# Patient Record
Sex: Male | Born: 1955 | Race: Black or African American | Hispanic: No | Marital: Married | Smoking: Former smoker
Health system: Southern US, Community
[De-identification: ages and names within clinical notes are randomized; demographics above are authoritative.]

## PROBLEM LIST (undated history)

## (undated) DIAGNOSIS — K219 Gastro-esophageal reflux disease without esophagitis: Secondary | ICD-10-CM

## (undated) DIAGNOSIS — J9383 Other pneumothorax: Secondary | ICD-10-CM

## (undated) DIAGNOSIS — I429 Cardiomyopathy, unspecified: Secondary | ICD-10-CM

## (undated) DIAGNOSIS — N182 Chronic kidney disease, stage 2 (mild): Secondary | ICD-10-CM

## (undated) DIAGNOSIS — I10 Essential (primary) hypertension: Secondary | ICD-10-CM

## (undated) DIAGNOSIS — I5042 Chronic combined systolic (congestive) and diastolic (congestive) heart failure: Secondary | ICD-10-CM

## (undated) DIAGNOSIS — I2510 Atherosclerotic heart disease of native coronary artery without angina pectoris: Secondary | ICD-10-CM

## (undated) DIAGNOSIS — I480 Paroxysmal atrial fibrillation: Secondary | ICD-10-CM

## (undated) DIAGNOSIS — I513 Intracardiac thrombosis, not elsewhere classified: Secondary | ICD-10-CM

## (undated) DIAGNOSIS — E782 Mixed hyperlipidemia: Secondary | ICD-10-CM

## (undated) DIAGNOSIS — I4719 Other supraventricular tachycardia: Secondary | ICD-10-CM

## (undated) DIAGNOSIS — I2781 Cor pulmonale (chronic): Secondary | ICD-10-CM

## (undated) DIAGNOSIS — E119 Type 2 diabetes mellitus without complications: Secondary | ICD-10-CM

## (undated) DIAGNOSIS — Z8701 Personal history of pneumonia (recurrent): Secondary | ICD-10-CM

## (undated) DIAGNOSIS — J449 Chronic obstructive pulmonary disease, unspecified: Secondary | ICD-10-CM

## (undated) DIAGNOSIS — I471 Supraventricular tachycardia: Secondary | ICD-10-CM

## (undated) HISTORY — DX: Cor pulmonale (chronic): I27.81

## (undated) HISTORY — DX: Cardiomyopathy, unspecified: I42.9

## (undated) HISTORY — DX: Gastro-esophageal reflux disease without esophagitis: K21.9

## (undated) HISTORY — DX: Intracardiac thrombosis, not elsewhere classified: I51.3

## (undated) HISTORY — DX: Essential (primary) hypertension: I10

## (undated) HISTORY — PX: PILONIDAL CYST EXCISION: SHX744

## (undated) HISTORY — DX: Chronic obstructive pulmonary disease, unspecified: J44.9

## (undated) HISTORY — PX: CORONARY ANGIOPLASTY WITH STENT PLACEMENT: SHX49

## (undated) HISTORY — DX: Atherosclerotic heart disease of native coronary artery without angina pectoris: I25.10

---

## 1997-10-08 ENCOUNTER — Ambulatory Visit (HOSPITAL_COMMUNITY): Admission: RE | Admit: 1997-10-08 | Discharge: 1997-10-08 | Payer: Self-pay | Admitting: Orthopedic Surgery

## 1997-10-23 ENCOUNTER — Ambulatory Visit (HOSPITAL_COMMUNITY): Admission: RE | Admit: 1997-10-23 | Discharge: 1997-10-23 | Payer: Self-pay | Admitting: Orthopedic Surgery

## 1997-11-06 ENCOUNTER — Ambulatory Visit (HOSPITAL_COMMUNITY): Admission: RE | Admit: 1997-11-06 | Discharge: 1997-11-06 | Payer: Self-pay | Admitting: Orthopedic Surgery

## 2000-04-03 DIAGNOSIS — J9383 Other pneumothorax: Secondary | ICD-10-CM

## 2000-04-03 HISTORY — DX: Other pneumothorax: J93.83

## 2000-04-20 ENCOUNTER — Encounter: Payer: Self-pay | Admitting: Thoracic Surgery (Cardiothoracic Vascular Surgery)

## 2000-04-20 ENCOUNTER — Encounter (INDEPENDENT_AMBULATORY_CARE_PROVIDER_SITE_OTHER): Payer: Self-pay | Admitting: Specialist

## 2000-04-20 ENCOUNTER — Encounter: Payer: Self-pay | Admitting: *Deleted

## 2000-04-20 ENCOUNTER — Inpatient Hospital Stay (HOSPITAL_COMMUNITY): Admission: EM | Admit: 2000-04-20 | Discharge: 2000-05-10 | Payer: Self-pay | Admitting: *Deleted

## 2000-04-20 ENCOUNTER — Encounter (INDEPENDENT_AMBULATORY_CARE_PROVIDER_SITE_OTHER): Payer: Self-pay | Admitting: *Deleted

## 2000-04-21 ENCOUNTER — Encounter: Payer: Self-pay | Admitting: Thoracic Surgery (Cardiothoracic Vascular Surgery)

## 2000-04-22 ENCOUNTER — Encounter: Payer: Self-pay | Admitting: Thoracic Surgery (Cardiothoracic Vascular Surgery)

## 2000-04-23 ENCOUNTER — Encounter: Payer: Self-pay | Admitting: Thoracic Surgery (Cardiothoracic Vascular Surgery)

## 2000-04-24 ENCOUNTER — Encounter: Payer: Self-pay | Admitting: Thoracic Surgery (Cardiothoracic Vascular Surgery)

## 2000-04-25 ENCOUNTER — Encounter: Payer: Self-pay | Admitting: Thoracic Surgery (Cardiothoracic Vascular Surgery)

## 2000-04-26 ENCOUNTER — Encounter: Payer: Self-pay | Admitting: Thoracic Surgery (Cardiothoracic Vascular Surgery)

## 2000-04-26 HISTORY — PX: LUNG REMOVAL, PARTIAL: SHX233

## 2000-04-27 ENCOUNTER — Encounter: Payer: Self-pay | Admitting: Thoracic Surgery (Cardiothoracic Vascular Surgery)

## 2000-04-28 ENCOUNTER — Encounter: Payer: Self-pay | Admitting: Thoracic Surgery (Cardiothoracic Vascular Surgery)

## 2000-04-29 ENCOUNTER — Encounter: Payer: Self-pay | Admitting: Thoracic Surgery

## 2000-04-29 ENCOUNTER — Encounter: Payer: Self-pay | Admitting: Thoracic Surgery (Cardiothoracic Vascular Surgery)

## 2000-04-30 ENCOUNTER — Encounter: Payer: Self-pay | Admitting: Thoracic Surgery (Cardiothoracic Vascular Surgery)

## 2000-05-01 ENCOUNTER — Encounter: Payer: Self-pay | Admitting: Thoracic Surgery (Cardiothoracic Vascular Surgery)

## 2000-05-02 ENCOUNTER — Encounter: Payer: Self-pay | Admitting: Thoracic Surgery (Cardiothoracic Vascular Surgery)

## 2000-05-03 ENCOUNTER — Encounter: Payer: Self-pay | Admitting: Thoracic Surgery (Cardiothoracic Vascular Surgery)

## 2000-05-04 ENCOUNTER — Encounter: Payer: Self-pay | Admitting: Thoracic Surgery (Cardiothoracic Vascular Surgery)

## 2000-05-04 ENCOUNTER — Encounter: Payer: Self-pay | Admitting: Thoracic Surgery

## 2000-05-05 ENCOUNTER — Encounter: Payer: Self-pay | Admitting: Thoracic Surgery (Cardiothoracic Vascular Surgery)

## 2000-05-06 ENCOUNTER — Encounter: Payer: Self-pay | Admitting: Thoracic Surgery (Cardiothoracic Vascular Surgery)

## 2000-05-07 ENCOUNTER — Encounter: Payer: Self-pay | Admitting: Thoracic Surgery (Cardiothoracic Vascular Surgery)

## 2000-05-08 ENCOUNTER — Encounter: Payer: Self-pay | Admitting: Thoracic Surgery

## 2000-05-09 ENCOUNTER — Encounter: Payer: Self-pay | Admitting: Thoracic Surgery (Cardiothoracic Vascular Surgery)

## 2000-05-10 ENCOUNTER — Encounter: Payer: Self-pay | Admitting: Thoracic Surgery (Cardiothoracic Vascular Surgery)

## 2000-05-14 ENCOUNTER — Encounter: Payer: Self-pay | Admitting: Thoracic Surgery (Cardiothoracic Vascular Surgery)

## 2000-05-14 ENCOUNTER — Encounter
Admission: RE | Admit: 2000-05-14 | Discharge: 2000-05-14 | Payer: Self-pay | Admitting: Thoracic Surgery (Cardiothoracic Vascular Surgery)

## 2000-05-14 ENCOUNTER — Inpatient Hospital Stay (HOSPITAL_COMMUNITY)
Admission: AD | Admit: 2000-05-14 | Discharge: 2000-05-21 | Payer: Self-pay | Admitting: Thoracic Surgery (Cardiothoracic Vascular Surgery)

## 2000-05-15 ENCOUNTER — Encounter: Payer: Self-pay | Admitting: Thoracic Surgery (Cardiothoracic Vascular Surgery)

## 2000-05-16 ENCOUNTER — Encounter: Payer: Self-pay | Admitting: Thoracic Surgery (Cardiothoracic Vascular Surgery)

## 2000-05-17 ENCOUNTER — Encounter: Payer: Self-pay | Admitting: Thoracic Surgery (Cardiothoracic Vascular Surgery)

## 2000-05-18 ENCOUNTER — Encounter: Payer: Self-pay | Admitting: Thoracic Surgery (Cardiothoracic Vascular Surgery)

## 2000-05-19 ENCOUNTER — Encounter: Payer: Self-pay | Admitting: Thoracic Surgery (Cardiothoracic Vascular Surgery)

## 2000-05-20 ENCOUNTER — Encounter: Payer: Self-pay | Admitting: Thoracic Surgery (Cardiothoracic Vascular Surgery)

## 2000-05-21 ENCOUNTER — Encounter: Payer: Self-pay | Admitting: Thoracic Surgery (Cardiothoracic Vascular Surgery)

## 2000-05-30 ENCOUNTER — Ambulatory Visit: Admission: RE | Admit: 2000-05-30 | Discharge: 2000-05-30 | Payer: Self-pay | Admitting: Pulmonary Disease

## 2000-06-04 ENCOUNTER — Encounter: Payer: Self-pay | Admitting: Thoracic Surgery (Cardiothoracic Vascular Surgery)

## 2000-06-04 ENCOUNTER — Encounter
Admission: RE | Admit: 2000-06-04 | Discharge: 2000-06-04 | Payer: Self-pay | Admitting: Thoracic Surgery (Cardiothoracic Vascular Surgery)

## 2000-07-02 ENCOUNTER — Encounter: Payer: Self-pay | Admitting: Thoracic Surgery (Cardiothoracic Vascular Surgery)

## 2000-07-02 ENCOUNTER — Encounter
Admission: RE | Admit: 2000-07-02 | Discharge: 2000-07-02 | Payer: Self-pay | Admitting: Thoracic Surgery (Cardiothoracic Vascular Surgery)

## 2000-07-31 ENCOUNTER — Encounter (HOSPITAL_COMMUNITY): Admission: RE | Admit: 2000-07-31 | Discharge: 2000-10-29 | Payer: Self-pay | Admitting: Surgical Oncology

## 2000-10-30 ENCOUNTER — Encounter (HOSPITAL_COMMUNITY): Admission: RE | Admit: 2000-10-30 | Discharge: 2001-01-28 | Payer: Self-pay | Admitting: Surgical Oncology

## 2004-07-18 ENCOUNTER — Ambulatory Visit: Payer: Self-pay | Admitting: Pulmonary Disease

## 2005-08-01 ENCOUNTER — Ambulatory Visit: Payer: Self-pay | Admitting: Pulmonary Disease

## 2006-11-20 ENCOUNTER — Ambulatory Visit: Payer: Self-pay | Admitting: Emergency Medicine

## 2007-03-13 ENCOUNTER — Telehealth (INDEPENDENT_AMBULATORY_CARE_PROVIDER_SITE_OTHER): Payer: Self-pay | Admitting: *Deleted

## 2007-03-14 ENCOUNTER — Telehealth (INDEPENDENT_AMBULATORY_CARE_PROVIDER_SITE_OTHER): Payer: Self-pay | Admitting: *Deleted

## 2007-05-14 ENCOUNTER — Telehealth (INDEPENDENT_AMBULATORY_CARE_PROVIDER_SITE_OTHER): Payer: Self-pay | Admitting: *Deleted

## 2007-05-14 DIAGNOSIS — K219 Gastro-esophageal reflux disease without esophagitis: Secondary | ICD-10-CM | POA: Insufficient documentation

## 2007-05-14 DIAGNOSIS — J4489 Other specified chronic obstructive pulmonary disease: Secondary | ICD-10-CM | POA: Insufficient documentation

## 2007-05-14 DIAGNOSIS — J449 Chronic obstructive pulmonary disease, unspecified: Secondary | ICD-10-CM

## 2007-06-05 ENCOUNTER — Ambulatory Visit: Payer: Self-pay | Admitting: Emergency Medicine

## 2008-01-08 ENCOUNTER — Emergency Department (HOSPITAL_COMMUNITY): Admission: EM | Admit: 2008-01-08 | Discharge: 2008-01-08 | Payer: Self-pay | Admitting: Emergency Medicine

## 2008-01-14 ENCOUNTER — Telehealth (INDEPENDENT_AMBULATORY_CARE_PROVIDER_SITE_OTHER): Payer: Self-pay | Admitting: *Deleted

## 2008-01-15 ENCOUNTER — Ambulatory Visit: Payer: Self-pay | Admitting: Internal Medicine

## 2008-02-04 ENCOUNTER — Ambulatory Visit: Payer: Self-pay | Admitting: Emergency Medicine

## 2008-08-04 ENCOUNTER — Ambulatory Visit: Payer: Self-pay | Admitting: Cardiology

## 2009-06-01 ENCOUNTER — Ambulatory Visit: Payer: Self-pay | Admitting: Emergency Medicine

## 2009-06-28 ENCOUNTER — Encounter (HOSPITAL_COMMUNITY): Admission: RE | Admit: 2009-06-28 | Discharge: 2009-07-28 | Payer: Self-pay | Admitting: Preventative Medicine

## 2009-08-02 ENCOUNTER — Ambulatory Visit (HOSPITAL_COMMUNITY): Admission: RE | Admit: 2009-08-02 | Discharge: 2009-08-02 | Payer: Self-pay | Admitting: Preventative Medicine

## 2009-08-04 ENCOUNTER — Encounter: Admission: RE | Admit: 2009-08-04 | Discharge: 2009-08-04 | Payer: Self-pay | Admitting: Preventative Medicine

## 2009-08-23 ENCOUNTER — Encounter
Admission: RE | Admit: 2009-08-23 | Discharge: 2009-08-23 | Payer: Self-pay | Source: Home / Self Care | Admitting: Preventative Medicine

## 2010-04-24 ENCOUNTER — Encounter: Payer: Self-pay | Admitting: Preventative Medicine

## 2010-05-03 NOTE — Progress Notes (Signed)
Summary: need samples  Phone Note Call from Patient   Caller: Patient Call For: byrum Summary of Call: need samples of advair 250/50 inhaler Patient's chart has been requested.  Initial call taken by: Rickard Patience,  May 14, 2007 1:23 PM  Follow-up for Phone Call        No samples available. Pt to refill rx through pharmacy.  He was also sched an rov with [facility] for march 4th 09. Follow-up by: Vernie Murders,  May 14, 2007 2:24 PM

## 2010-05-03 NOTE — Assessment & Plan Note (Signed)
Summary: COPD   Visit Type:  Follow-up Primary Provider/Referring Provider:  Dr Cecelia Byars  CC:  Followup COPD.  Pt last seen Nov 2009.  Pt states that overall breathing has been doing okay.  He needs refills for advair and proair.  Pt denies any complaints today.Marland Kitchen  History of Present Illness: Mr. 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    01/15/2008--Complains of 2 weeks of cough, congestion, blood tinged initially. Went to ER CXR and CT chest showed 4 R broken ribs, negative for PE. bilateral scarring, and questionable opacities. Started on Avelox for 4 days.(per pt ). Some better but still has coughing fits with thick mucus. and DOE, no energy. Pain in ribs with coughng. finished avelox 2 days. ago.   02/04/08 - returns today for f/u, has completed avelox. CP is better. Breathing is better. Cough and hemoptysis have resolved. Seldom uses ProAir.   ROV 06/01/09 -- Returns for f/u, last seen 2009. His breathing has been stable. Tells me he ran out of Advair about 2 weeks ago, noticed that his SABA use increased some. Has had some anxiety at night about not getting his meds but breathing ok. No exacerbations since last visit. No smoking. No cough or hemoptysis.   Current Medications (verified): 1)  Advair Diskus 250-50 Mcg/dose Misc (Fluticasone-Salmeterol) .... Inhale 1 Puff Two Times A Day 2)  Omeprazole 20 Mg Cpdr (Omeprazole) .... Take 1 Capsule By Mouth Once A Day As Needed 3)  Proair Hfa 108 (90 Base) Mcg/act  Aers (Albuterol Sulfate) .... Inhale 2 Puffs Every 4-6 Hours As Needed 4)  Percocet 7.5-325 Mg Tabs (Oxycodone-Acetaminophen) .... Take 1 Tablet By Mouth Three Times A Day As Needed 5)  Ibuprofen 600 Mg Tabs (Ibuprofen) .... Take 1 Tablet By Mouth Three Times A Day As Needed 6)  Crestor 10 Mg Tabs (Rosuvastatin Calcium) .... Take 1 Tablet By Mouth Once A Day 7)  Norvasc 5 Mg Tabs (Amlodipine Besylate) ....  Take 1 Tablet By Mouth Once A Day  Allergies (verified): No Known Drug Allergies  Vital Signs:  Patient profile:   55 year old male Height:      67 inches Weight:      195.50 pounds BMI:     30.73 O2 Sat:      96 % on Room air Temp:     98.2 degrees F oral Pulse rate:   82 / minute BP sitting:   126 / 74  (left arm)  Vitals Entered By: Vernie Murders (June 01, 2009 3:15 PM)  O2 Flow:  Room air  Physical Exam  General:  well developed, well nourished, in no acute distress Head:  normocephalic and atraumatic Nose:  no deformity, discharge, inflammation, or lesions Mouth:  no deformity or lesions Neck:  no masses, thyromegaly, or abnormal cervical nodes Chest Wall:  no deformities noted Lungs:  no wheezing.  Heart:  regular rate and rhythm, S1, S2 without murmurs, rubs, gallops, or clicks Abdomen:  not examined Extremities:  no clubbing, cyanosis, edema, or deformity noted Neurologic:  CN II-XII grossly intact with normal reflexes, coordination, muscle strength and tone   Impression & Recommendations:  Problem # 1:  COPD (ICD-496) Advair + ProAir repeat PFT and CXR in a year ROV 6 months and get the flu shot at that time  Medications Added to Medication List This Visit: 1)  Omeprazole 20 Mg Cpdr (Omeprazole) .... Take 1 capsule  by mouth once a day as needed  Other Orders: Est. Patient Level III ([address]109)  Patient Instructions: 1)  Continue your Advair two times a day  2)  Have your ProAir available to use as needed  3)  Follow up with Dr Delton Coombes in 6 months or as needed  4)  You will need a flu shot at your next visit.  Prescriptions: ADVAIR DISKUS 250-50 MCG/DOSE MISC (FLUTICASONE-SALMETEROL) Inhale 1 puff two times a day  #60 Each x 11   Entered and Authorized by:   Leslye Peer MD   Signed by:   Leslye Peer MD on 06/01/2009   Method used:   Electronically to        [facility] (retail)       [address]       [place], [place]  [address]       Ph: [phone]       Fax: [phone]   RxID:   [id number] PROAIR HFA 108 (90 BASE) MCG/ACT  AERS (ALBUTEROL SULFATE) inhale 2 puffs every 4-6 hours as needed  #1 x 6   Entered and Authorized by:   Leslye Peer MD   Signed by:   Leslye Peer MD on 06/01/2009   Method used:   Electronically to        [facility] (retail)       [address]       [place], [place]  [address]       Ph: [phone]       Fax: [phone]   RxID:   [id number]

## 2010-07-06 ENCOUNTER — Other Ambulatory Visit: Payer: Self-pay | Admitting: Emergency Medicine

## 2010-07-13 ENCOUNTER — Ambulatory Visit: Payer: Self-pay | Admitting: Emergency Medicine

## 2010-07-25 ENCOUNTER — Encounter: Payer: Self-pay | Admitting: Emergency Medicine

## 2010-07-27 ENCOUNTER — Ambulatory Visit: Payer: Self-pay | Admitting: Emergency Medicine

## 2010-08-16 NOTE — Assessment & Plan Note (Signed)
[facility]                             PULMONARY OFFICE NOTE   Danny [name], Danny [name]                         MRN:          [id number]  DATE:11/20/2006                            DOB:          March 15, 1956    PULMONARY FOLLOWUP VISIT   SUBJECTIVE:  Danny [name] is a 55 year old gentleman with a history of COPD  and bullous emphysema status post bilateral bullectomies in  2002.  He  returns today for a regularly scheduled followup visit.  Since his last  visit with Dr. Jayme Cloud, he has been doing fairly well.  He has good  exertional tolerance.  He denies any significant cough.  He has not had  any exacerbation since his last visit.  He does complain of some  occasional costochondritic pain that is sharp and cramping in nature.  It occurs at the sites of his old chest tubes.  He has not had any  exertional chest discomfort or any symptoms consistent with angina.   MEDICATIONS:  1. Norvasc 5 mg daily.  2. Advair 250/50 one inhalation b.i.d.  3. Omeprazole 20 mg daily.   EXAM:  GENERAL:  This is a very pleasant gentleman who is in no distress  on room air.  His weight is 189 pounds, up 2 pounds from his last visit, temperature  98.4, blood pressure 110/78, heart rate 97, SpO2 93% on room air.  HEENT:  Benign.  NECK:  Supple without lymphadenopathy or stridor.  LUNGS:  Distant with good air movement on a normal respiratory cycle and  some wheezing on a forced expiration.  HEART:  Regular without murmur.  ABDOMEN:  Soft, nontender, nondistended with positive bowel sounds.  EXTREMITIES:  No cyanosis, clubbing, or edema.   IMPRESSION:  1. Chronic obstructive pulmonary disease and a history of bullous      emphysema status post bilateral bullectomies.  2. Gastroesophageal reflux disease.   PLAN:  1. He will continue his Advair as currently scheduled.  2. I will give him albuterol to use on an as needed basis.  3. Danny [name] will have a Pneumovax today and  we will arrange to repeat      his immunizations      every 5 years until he is 55 years old.  4. He will return to see me in 6 months or sooner should he have any      difficulty in the interim.     Leslye Peer, MD  Electronically Signed    RSB/MedQ  DD: 11/20/2006  DT: 11/20/2006  Job #: [id number]

## 2010-08-19 NOTE — Procedures (Signed)
[place]. [facility]  Patient:    Danny [name], Danny [name]                         MRN: [id number] Proc. Date: 05/02/00 Adm. Date:  [id number] Attending:  Tressie Stalker CC:         Anesthesia Department   Procedure Report  PREOPERATIVE DIAGNOSIS:  History of emphysematous bleb disease with pneumothorax.  POSTOPERATIVE DIAGNOSIS:  History of emphysematous bleb disease with pneumothorax.  PROCEDURE:  Thoracotomy for resection of portion of a lung and emphysematous blebs.  ANESTHESIA PROCEDURE:  Placement of thoracic epidural catheter for postoperative analgesia.  DESCRIPTION OF PROCEDURE:  Preoperatively, risks and benefits of placement of the epidural catheter were discussed in detail with the patient, including the recommendation by his thoracic surgeon that he have this for his postoperative pain control.  In fact, the patient had had a previous epidural after previous similar surgery on the other side two weeks prior.  He consented to the placement of the epidural catheter for his postoperative analgesia.  At the end of the operative procedure, the patient was allowed to remain in the right lateral decubitus position.  After sterile prep of the midthoracic area was conducted, using a #17 gauge Tuohy needle adjacent to the T8-9 interspace, the epidural space was contacted with loss of resistance technique and the catheter threaded to approximately 3-4 cm beyond the needle tip, and the needle was removed.  After negative aspiration of both heme and CSF, the catheter was injected slowly with 5 cc of 0.25% Marcaine containing 100 mcg _____.  The patient was secured in place with tape.  The patient was turned supine, extubated, and transferred to the PACU in stable condition.  DISPOSITION:  The patient will be followed daily by the department of anesthesiology for his postoperative analgesia via epidural catheter. DD:  05/02/00 TD:  05/02/00 Job:  [id number]

## 2010-08-19 NOTE — H&P (Signed)
[place]. [facility]  Patient:    Danny [name], Danny [name]                         MRN: [id number] Adm. Date:  05/14/00 Attending:  Salvatore Decent. Cornelius Moras, M.D. Dictator:   Marlowe Kays, P.A.                         History and Physical  DATE OF BIRTH:  Jan 10, 1956.  PULMONOLOGIST:  Dr. Danice Goltz.  CHIEF COMPLAINT:  Loculated right pneumothorax.  HISTORY OF PRESENT ILLNESS:  Danny [name] is a pleasant 55 year old black male with a long history of pulmonary disease and tobacco abuse, status post left VATS resection of the left bleb -- apical pleurectomy -- on May 02, 2000 secondary to recurrent left pneumothorax as well as right VATS, right wedge resection of the giant right apical bleb and apical pleural tent on April 26, 2000.  The patient presented for a followup visit at [facility], with a chest x-ray.  Despite the fact that the patient is asymptomatic for pneumothorax, a chest x-ray revealed right lower lobe pneumothorax, loculated. His right chest tube is in place.  Upon evaluation, Dr. Salvatore Decent. Cornelius Moras recommended that the patient be admitted in the hospital for resolution of this pneumothorax.  PAST MEDICAL HISTORY 1. History of recurrent pneumothorax. 2. Remote history of tobacco abuse. 3. Hypertension.  PAST SURGICAL HISTORY 1. Status post right VATS, mini-thoracotomy with right upper lobe wedge    resection of giant bulla and apical pleurodesis on April 26, 2000,    Dr. Cornelius Moras. 2. Left VATS, mini-thoracotomy with resection of the left lung bleb and    apical pleurectomy, May 02, 2000. 3. Multiple chest tube placements during last hospitalization. 4. Status post Heimlich valve placement, May 08, 2000.  MEDICATIONS 1. Norvasc 5 mg p.o. q.d. 2. Albuterol and Pulmicort nebulizer treatments q.i.d.  ALLERGIES:  No known drug allergies.  REVIEW OF SYSTEMS:  The patient denies any history of diabetes, kidney disease, asthma, TIA, CVA,  amaurosis fugax, angina, arrhythmias, MI, dysuria, hematuria, CHF.  He denies any shortness of breath, dyspnea on exertion, paroxysmal nocturnal dyspnea or orthopnea.  No fever or chills.  No hemoptysis.  FAMILY HISTORY:  Noncontributory.  SOCIAL HISTORY:  The patient is married with two children.  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  He quit nine years ago the use of cigarettes.  He denies any alcohol intake.  PHYSICAL EXAMINATION  GENERAL:  This is a well-developed, thin 55 year old black male in no acute distress, alert and oriented x 3.  VITAL SIGNS:  Blood pressure 110/58, pulse 70, respirations 18.  HEENT:  Normocephalic, atraumatic.  PERRLA.  EOMI.  Funduscopic exam within normal limits.  NECK:  Supple.  No JVD, bruits, thyromegaly or lymphadenopathy.  CHEST:  Symmetrical on inspiration with absent breath sounds in the right lower lobe area with tympany to percussion.  There are well-healed bilateral VATS incisions.  CARDIOVASCULAR:  Regular rate and rhythm.  No murmurs, rubs, or gallops.  ABDOMEN:  Soft and nontender.  Bowel sounds x 4.  No hepatosplenomegaly, masses palpable or abdominal bruits.  GU:  Deferred.  RECTAL:  Deferred.  EXTREMITIES:  No clubbing, cyanosis, or edema.  SKIN:  Skin shows no ulcerations, warm temperature and normal hair pattern.  PERIPHERAL PULSES:  Carotids 2+ bilaterally, femorals 2+ bilateral. Popliteal, dorsalis pedis and posterior tibialis 2+  bilaterally.  NEUROLOGIC:  Nonfocal.  Normal gait.  DTRs 2+ bilaterally.  Muscle strength 5/5.  ASSESSMENT AND PLAN:  55-year-old black male with loculated right pneumothorax loculated right pneumothorax who will be admitted to [facility] on May 14, 2000 by Dr. Cornelius Moras.  Dr. Cornelius Moras has seen and evaluated this patient prior to the admission and has explained the reasons for him being admitted to the hospital.  The patient has agreed to continue in this emergency admission.  DD: 05/15/00 TD:  05/15/00 Job: [id number]

## 2010-08-19 NOTE — Discharge Summary (Signed)
[place]. [facility]  Patient:    Danny [name], Danny [name]                         MRN: [id number] Adm. Date:  04/20/00 Disc. Date: 05/10/00 Attending:  Salvatore Decent. Cornelius Moras, M.D. Dictator:   Lissa Merlin, P.A.-C. CC:         [facility] Office  Danice Goltz, M.D.   Discharge Summary  DATE OF BIRTH:  [birth year]  SURGEON:  Salvatore Decent. Cornelius Moras, M.D.  PULMONOLOGY:  Danice Goltz, M.D.  ADMISSION DIAGNOSIS:  Acute shortness of breath.  DISCHARGE DIAGNOSES: 1. Bilateral spontaneous pneumothoraces April 20, 2000. 2. 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 3. Severe chronic obstructive pulmonary disease with severe bullous emphysema    component. 4. Spontaneous right tension pneumothorax April 24, 2000. 5. Tracheobronchitis treated with intravenous antibiotics, resolved. 6. Persistent right-side air leak, Heimlich valve inserted. 7. Postoperative mild acute renal insufficiency, resolved. 8. Mild postoperative anemia, improved.  PROCEDURES: 1. Bilateral chest tube placement for spontaneous pneumothoraces    April 20, 2000. 2. Right chest tube discontinued April 23, 2000. 3. Right chest tube reinserted April 24, 2000 at 4:30 a.m. secondary to    recurrent spontaneous pneumothorax. 4. Right VATS mini thoracotomy with right upper lobe wedge resection of giant    bulla and apical pleurodesis April 26, 2000. 5. Left pleural chest tube discontinued April 28, 2000. 6. Recurrent left tension pneumothorax April 29, 2000, new left chest tube    inserted. 7. Left chest tube discontinued May 04, 2000. 8. Left VATS mini thoracotomy with resection of left lung bleb and apical    pleurectomy May 02, 2000. 9. Heimlich valve applied May 08, 2000.  MEDICATIONS: 1. Home oxygen 2 L/min as needed. 2. Albuterol nebulizer 2.5 mg q.6h. 3. Budesonide nebulizer 0.25 mg q.6h. 4. Darvocet-N 100 one to two p.o. q.4-6h. p.r.n. for pain. 5. Norvasc 5 mg one  p.o. q.d. for previously diagnosed hypertension.  ALLERGIES:  No known drug allergies.  HISTORY OF PRESENT ILLNESS:  Patient is a 55 year old African-American male who initially presented to the emergency room with sudden onset of chest pain and severe shortness of breath.  He was found to have large bilateral pneumothoraces.  Bilateral chest tubes were inserted.  Over the next several days, Mr. Lax developed recurrent pneumothoraces on the right and left requiring additional chest tube placement.  He ultimately underwent bilateral thoracotomies with resection of blebs and pleurodesis/pleurectomy.  He did well with the surgeries and had no major complications.  He did have a persistent right-sided air leak eventually necessitating Heimlich valve attachment on May 08, 2000.  He has been seen by pulmonology who has put him on nebulizer therapy.  By May 09, 2000, he is on unit 2000, alert, feeling well, ambulating without difficulty, using only low flow O2.  He says that he has walked around the unit without oxygen but does need it occasionally.  He is eating well.  He is having normal bowel movements. Urinary output is good.  He is afebrile.  Vital signs are stable.  He is 94% on 1 L.  Lab work has been satisfactory.  Mild postop anemia and mild renal insufficiency have improved.  The incision is healing well.  Physical exam is satisfactory.  Heimlich valve site is stable.  He is noted to be doing well with plans for discharge pending satisfactory morning rounds tomorrow, Thursday, May 10, 2000.  CONDITION:  Stable and  improved.  SPECIAL INSTRUCTIONS:  Mr. Wirick is told to do no driving, no strenuous activity, no lifting more than 10 pounds.  He is told to walk daily and use his incentive spirometer daily.  He is told that he can shower but to keep his incision areas clean and dry as possible and to avoid getting the Heimlich valve area wet.  He is given dressings to apply  to the end of the Heimlich valve daily as needed for any drainage that may occur.  He is told to call the office if he notices anything unusual with his wounds or with the valve. He is told home health nurse will visit him to check on the valve.  He is told to get a chest x-ray at [facility] at 10 a.m. on May 14, 2000 and bring it with him to see Dr. Cornelius Moras.  FOLLOWUP: 1. He will see Dr. Cornelius Moras on Monday, May 14, 2000 at 11 a.m. 2. He will see Dr. Jayme Cloud two to three weeks after discharge and is to call    office for appointment. DD:  05/09/00 TD:  05/10/00 Job: [id number]

## 2010-08-19 NOTE — Procedures (Signed)
[place]. [facility]  Patient:    [name], [name]                         MRN: [id number] Proc. Date: 04/26/00 Adm. Date:  [id number] Attending:  Tressie Stalker                           Procedure Report  PROCEDURE:  Epidural catheter placement.  ANESTHESIOLOGIST:  Halford Decamp, M.D.  INDICATIONS:  [name] is a 55 year old black male with bilateral spontaneous pneumothoraces for pulmonary blebs, who presents to the operating room for a thoracotomy and excision of blebs.  The patient underwent a routine anesthetic, and during his surgery the patient required an open thoracotomy, as opposed to a thoracoscopy, secondary to the large size of the blebs.  Based on the open thoracotomy, it was felt that the patient would benefit from a postoperative epidural.  DESCRIPTION OF PROCEDURE:  The patient was placed in the left lateral decubitus position and remained under general anesthesia.  The thoracic spine was sterilely prepped and draped.  The T6-7 interspace was approached using a #17 gauge Tuohy needle.  The needle was advanced via loss of resistance technique, and a catheter was inserted approximately 4.0 cm in the epidural space.  The needle was removed.  The catheter did not spray blood or CSF, and a 5 cc test dose of 1% lidocaine was negative.  Following the negative test dose, 2 cc of fentanyl was administered diluted to 6 cc with normal saline. The catheter was then secured to the patients back.  The patient was placed in the supine position, extubated, and brought to the post anesthesia care unit.  In the post anesthesia care unit the patient was placed on a Marcaine and fentanyl infusion, and will be followed by the acute pain service.  The patient tolerated the procedure well. DD:  04/26/00 TD:  04/26/00 Job: [id number]

## 2010-08-19 NOTE — H&P (Signed)
[place]. [facility]  Patient:    Danny [name], Danny [name]                         MRN: [id number] Adm. Date:  [id number] Attending:  Tressie Stalker                         History and Physical  ADMISSION DIAGNOSIS:  Bilateral spontaneous pneumothorax.  HISTORY OF PRESENT ILLNESS:  The patient is a 55 year old African-American male who presents to the emergency room with sudden onset of chest pain and severe shortness of breath beginning this evening between 5:30 and 6 oclock. The patient states he had been previously in his usual state of excellent health. He was work and doing some strenuous physical activity when he developed sudden onset of pain which was poorly localized and is described as dull, pressure-like pain across his chest.  Over the ensuing 45 minutes to an hour he developed progressive shortness of breath which rapidly progressed into respiratory distress.  The patient presented to the emergency room where he was noted to be hypoxic with shallow rapid breaths.  An arterial blood gas was obtained documenting a pH of 7.24 with a pCO2 of 67 and a pO2 of 47. Portable chest x-ray was performed demonstrating large bilateral pneumothoraces.  Thoracic surgical consultation was promptly obtained and bilateral chest tubes were placed uneventfully.  The patients respiratory distress resolved immediately.  REVIEW OF SYSTEMS:  The patient had been in his usual state of good health and denies any recent productive cough, fevers, chills, or shortness of breath.  PAST MEDICAL HISTORY:  Notable only for hypertension.  The patient does note that he had apaprently had episodes of pneumonia at least once or twice in the remote past.  He thinks that he may have had partial collapse of his lung in the distant past as well, although, he never required chest tube placement and this was never documented radiographically according to the patient.  PAST SURGICAL HISTORY:   Notable only for surgical excision of a perianal cyst.  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  He has a history of heavy tobacco use in the past, but he quit smoking several years ago.  He denies any history of heavy alcohol consumption.  MEDICATIONS:   Norvasc 5 mg p.o. once daily.  ALLERGIES:  The patient denies any known drug allergies or sensitivities.  PHYSICAL EXAMINATION:  At the time of initial presentation.  GENERAL:  A well-developed, well-nourished, young, thin, African-American male who appears somewhat younger than stated age.  He is in acute respiratory distress with shallow respirations.  VITAL SIGNS:  Blood pressure 160/90 and he is tachycardic with sinus rhythm at 110 beats per minute.  Oxygen saturation registers 88% on 100% face mask. Breath sounds are hollow and diminished bilaterally.  The trachea is midline.  HEART:  Demonstrates regular rate and rhythm.  No murmurs, rubs, or gallops are noted.  ABDOMEN:  Soft and nontender.  There is no sign of trauma on his chest wall including thoracic rib cage, nor the abdomen, nor either of his four extremities.  The remainder of his physical examination is unrevealing.  DIAGNOSTIC TESTS:  Admission portable chest x-ray demonstrates what appears to be two large bilateral pneumothoraces.  The apical segments of the left lung appear to be stuck, but there  is a large basilar and lateral pneumothorax on the left.  There also appears to be a large pneumothorax on the right.  The heart and mediastinal structures are midline.  Follow-up chest x-ray obtained bilateral chest tube placement documents reexpansion of the left lung with obvious large blebs within the pulmonary parenchyma.  The chest tube appears to be in appropriate position.  The right chest tube also appears to be in appropriate position within the chest, but there appears no  radiographic change in the appearance of the lung and this appears to be somewhat suggestive of a possible very large giant pulmonary bleb.  IMPRESSION:  Bilateral pneumothoraces with possible severe bullous emphysema with giant blebs.  PLAN:  Admission for close observation and chest tube placement to suction. We will obtain a chest CT scan to further document the presence and severity of the patients underlying lung disease. DD:  04/20/00 TD:  04/21/00 Job: [id number]

## 2010-08-19 NOTE — Op Note (Signed)
[place]. [facility]  Patient:    Danny [name], Danny [name]                         MRN: [id number] Proc. Date: 04/26/00 Adm. Date:  [id number] Attending:  Tressie Stalker CC:         Danice Goltz, M.D.             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  PROCEDURE:  Right VATS and right thoracotomy for wedge resection of giant right apical bleb, and apical pleural tent.  SURGEON:  Salvatore Decent. Cornelius Moras, M.D.  ASSISTANT:  _____.  ANESTHESIA:  General.  BRIEF CLINICAL NOTE:  The patient is a 55 year old male who was admitted on April 20, 2000, with spontaneous bilateral pneumothoraces.  Chest x-ray and chest CT scan confirmed the presence of severe bullous emphysema with a dramatic giant right lung bleb, which essentially obliterates and replaces the entire right upper lobe.  The patient was treated conservatively initially, and ultimately the right chest tube was removed after demonstration of resolution of the air leak.  Within the next 24 hours, the patient developed a recurrent large right pneumothorax.  The chest tube was replaced.  The patient and his wife are counseled at length regarding the indications and potential benefits of surgical intervention.  They understand the associated risks of surgery, including but not limited to risk of death, bleeding, arrhythmia, prolonged chest wall discomfort, pneumonia, respiratory failure, and prolonged air leak requiring prolonged chest tube drainage.  They accept these risks as well as any unforeseen complications and desire to proceed as described.  DESCRIPTION OF PROCEDURE:  The patient is brought to the operating room on the above-mentioned date, placed in the supine position on the operating table. Intravenous antibiotics are  administered.  Pneumatic sequential compression boots are placed on both lower legs to prevent deep venous thrombosis. General endotracheal anesthesia is induced uneventfully under the care and direction of J. Claybon Jabs, M.D.  A dual-lumen endotracheal tube is placed. The patient is turned to the left lateral decubitus position with an axillary roll and bean bag device to facilitate appropriate exposure and positioning. The right chest is prepared and draped in a sterile manner after removal of the existing right chest tube.  Single lung anesthesia is begun.  A small incision is made through approximately the anterior axillary line in the seventh intercostal space.  An endoscopic port is passed through the incision through the intercostal space.  A 30 degree videoscope camera is passed through the port.  The right chest is explored visually.  The right lung will not collapse.  Numerous attempts are made by the anesthesiologist to afford collapse of the lung such that videoscopic visualization is possible. Adequate exposure is not attained.  The right posterolateral thoracotomy incision is performed.  The latissimus dorsi muscle was divided with electrocautery.  The serratus anterior muscle was preserved.  The right chest is entered through the fifth intercostal space.  A portion of the fifth rib is divided to facilitate exposure.  Upon entry into the pleural space, the lung remains completely expanded although is not ventilated.  There appears to be severe air trapping with massive  obvious giant bleb, which replaces essentially the entire right upper lobe.  The bleb is entered sharply in its midportion, and the lung promptly decompresses.  The remainder of the lung is mobilized by dividing all adhesions between the visceral and parietal surfaces of the pleura with electrocautery and sharp dissection as necessary.  There are only a few adhesions which need to be taken down.  The  inferior pulmonary ligament is divided with electrocautery to facilitate complete exposure.  Careful examination is performed.  Essentially 85-90% of the right upper lobe is replaced by a single giant bleb. The right lower lobe and right middle lobe also have changes of emphysema but do not demonstrate any giant blebs and otherwise appear much more normal. There is a small rim of more normal-appearing lung parenchyma in the right upper lobe close to the hilum, especially anteriorly.  The trabeculations in the midportion of the bleb are divided sharply, and the cyst wall is subsequently rolled onto itself on both sides to facilitate the closure of normal tissue.  The giant bleb is then resected using multiple fires of Autosuture endoscopic GIA stapling device with 4.8 mm staples using Seamguard Gore-Tex to reinforce the staple lines.  A small portion of the cyst wall was excised sharply to be sent as a separate specimen labeled, "portion of bleb lining."  The remainder of the resected specimen is sent as "right upper lobe wedge resection" to pathology.  The staple lines are inspected and appear to be intact.  The right lung is gently ventilated and inspected for air leaks.  A small area on the apical posterior aspect of the small remaining portion of the right upper lobe has a small air leak.  This is addressed using running 3-0 Vicryl sutures in a horizontal mattress fashion to reapproximate the pleural surface.  There appears to be no significant air leak after completion of this maneuver.  The apical pleural tent is constructed by using electrocautery to score the pleura in a rectangular shape over the apex of the right chest and mediastinum.  The pleura is then mobilized using a combination of blunt dissection and electrocautery to facilitate a flap.  This flap was then allowed to drape over the surface of the right upper lobe.  It is secure in place in a couple areas with  interrupted 3-0 Vicryl sutures.  The right chest is irrigated with copious warm saline solution containing  antibiotic.  The right lung is again inflated and inspected for air leaks. There appear to be minimal residual small-caliber leaks from the lung parenchyma.  The right chest is drained using two 28 French chest tubes placed through separate stab wounds inferiorly.  One of these is placed through the original port incision.  The rightmost lateral thoracotomy incision is closed in multiple layers in routine fashion.  The skin incisions closed with subcuticular skin closure.  The chest tubes then fixed to a closed-suction drainage site.  The patient tolerated the procedure well.  A thoracic epidural catheter is placed by Dr. Krista Blue after cmopletion of the operation.  The patient is turned to the supine position, extubated in the operating room and transported to the recovery room in stable condition.  There were no intraoperative complications.  All sponge, instrument, and needle counts are verified correct at the completion of the operation.  Estimated blood loss was minimal, or less than 50 cc. DD:  04/26/00 TD:  04/26/00 Job: [id number]

## 2010-08-19 NOTE — Discharge Summary (Signed)
[place]. [facility]  Patient:    Danny [name], Danny [name]                       MRN: [id number] Adm. Date:  [id number] Disc. Date: 05/21/00 Attending:  Tressie Stalker Dictator:   Adair Patter, P.A. CC:         Danice Goltz, M.D.   Discharge Summary  DATE OF BIRTH:  July 04, 1955  ADMITTING DIAGNOSIS:  Loculated right pneumothorax.  SECONDARY DIAGNOSES: 1. History of recurrent pneumothorax. 2. History of tobacco abuse. 3. Hypertension.  DISCHARGE DIAGNOSIS:  Status post resolution of right loculated pneumothorax.  HOSPITAL PROCEDURES:  CT guided right chest tube placement.  HISTORY OF PRESENT ILLNESS:  The patient is a patient who is well-known to Dr. Cornelius Moras. He was seen in the office complaining of chest discomfort. He had a chest x-ray taken, which revealed an approximately 30% right basilar pneumothorax, which was new.  HOSPITAL COURSE:  Dr. Cornelius Moras subsequently admitted the patient to [facility] where he was admitted and had his chest tube, which he currently had, with Heimlich valve placed to suction. Over the next several days the patient was followed with serial chest x-rays taken. These chest x-rays revealed that the patient continued to have a small loculated pneumothorax despite chest tube suction. Because of this the patient underwent CAT scan revealing the right loculated pneumothorax. At this time, he underwent CT guided placement of a right chest tube because of this pneumothorax. The patient underwent serial chest x-rays and his pneumothorax subsequently resolved. The chest tube was discontinued on May 19, 2000. He was subsequently discharged home in satisfactory condition on May 21, 2000.  DISCHARGE MEDICATIONS: 1. Darvocet-N 100 one to two tablets q.4-6h. as needed for pain. 2. Norvasc 5 mg one tablet daily. 3. Albuterol hand held nebulizer four times daily. 4. Pulmicort hand held nebulizer four times daily.  DISCHARGE  ACTIVITIES:  The patient was told to avoid driving and strenuous activity.  DISCHARGE DIET:  Low fat, low salt.  WOUND CARE:  The patient was told he could clean his incision with soap and water and shower.  DISPOSITION:  Home.  FOLLOWUP:  The patient was told to followup with Dr. Cornelius Moras in two weeks at his office. He was told to go to the [facility] one hour before his appointment with Dr. Cornelius Moras for chest x-ray and to bring his chest x-ray to Dr. Barry Dienes office with him. DD:  05/20/00 TD:  05/21/00 Job: [id number]

## 2010-08-19 NOTE — Op Note (Signed)
[name]. [facility]  Patient:    Danny [name], Danny [name]                         MRN: [id number] Proc. Date: 05/02/00 Adm. Date:  [id number] Attending:  Tressie Stalker CC:         CVTS Office  Danice Goltz, M.D.   Operative Report  PREOPERATIVE DIAGNOSIS:  Recurrent left pneumothorax with severe emphysema.  POSTOPERATIVE DIAGNOSIS:  Recurrent left pneumothorax with severe emphysema.  PROCEDURE:  Left video-assisted thoracoscopic surgery and left thoracotomy for resection of left lung blebs and apical pleurectomy.  SURGEON:  Salvatore Decent. Cornelius Moras, M.D.  ASSISTANT:  Lissa Merlin, P.A.  ANESTHESIA:  General.  BRIEF CLINICAL NOTE:  The patient is a 55 year old gentleman with a history of tobacco abuse and severe emphysema, who was admitted on April 20, 2000 with bilateral spontaneous pneumothorax.  Chest tubes were placed at that time. Initially, the right chest tube was removed, but the patient developed recurrent right spontaneous pneumothorax that ultimately required right thoracotomy for resection of giant blebs.  The patient continued to do well. However, after removal of the left chest tube, the patient also developed recurrent left pneumothorax.  OPERATIVE CONSENT:  The patient and his wife were counselled regarding the indications and potential benefits of surgical intervention.  They accepted all associated risks of surgery, including, but not limited to risk of death, stroke, myocardial infarction, bleeding requiring blood transfusion, pneumonia, respiratory failure, prolonged chest wall discomfort, and prolonged air leak requiring prolonged chest tube drainage.  They accepted these risks, as well as, any unforeseen complications and desired to proceed with surgery as described.  OPERATIVE NOTE IN DETAIL:  The patient was brought to the operating room on the above mentioned date and placed in the supine position on the operating table.  Intravenous  antibiotics were administered.  General endotracheal anesthesia was induced uneventfully under the care and direction of Dr. Burna Forts.  A dual-lumen endotracheal tube was placed.  The patient was turned to the right lateral decubitus position with the use of an axillary roll and a pneumatic bean bag device to facilitate appropriate positioning. Pneumatic sequential compression boots were placed on both lower legs to prevent deep venous thrombosis.  The left chest was prepared and draped in a sterile manner.  Prior to skin preparation, the patients preexisting left chest tube had been removed.  A small incision was made overlying approximately the seventh intercostal space in the anterior axillary line.  The left pleural space was entered carefully with electrocautery.  The left lung was obviously collapsed and well away from the chest wall in this vicinity.  An endoscopic port was placed through the incision and the left chest was explored visually using a 30 degree endoscopic camera passed through the port.  One could appreciate severe bullous emphysema.  There was also a large amount of adhesions between the visceral and parietal surfaces along the anterolateral surface of the left upper lobe.  The remainder of the left lung was completely free such that it can collapse.  Two additional port incisions were placed including one more anteriorly through the fourth intercostal space and a second more posteriorly through the fifth intercostal space.  Endoscopic techniques were utilized in an attempt to mobilize the left lung.  However, the adhesions overlying the anterolateral surface of the left upper lobe were very dense and severe.  The posterior incision was then extended into a miniature thoracotomy  incision to facilitate exposure and allow better mobilization of the left lung.  A portion of the fifth rib was divided to facilitate exposure.  The adhesions between the visceral  and parietal surfaces of the pleura along the left anterolateral surface of the left upper lobe were so severe that extrapleural dissection was felt to be necessary.  This was performed uneventfully.  Eventually, the lung was completely mobilized.  The inferior pulmonary ligament was divided with electrocautery.  The left lung was examined.  There was one large bleb obviously  seen in the lingular portion of the left upper lobe.  This was resected using several fires of endoscopic stapling device with 3.5 mm staples and Gore-Tex Shearguard strips to buttress the staple lines.  This specimen was sent as left upper lobe bleb to pathology.  One can now also appreciate bleb formation at the apex of the left upper lobe.  The apical portion of the left upper lobe was then resected using several fires of the stapling device in a similar manner.  The remainder of the left lung was inspected and no other sites of obvious significant bleb formation were identified grossly. The left hemithorax was then filled with warm saline solution and the left lung was briefly ventilated to inspect for air leaks.  There were no significant air leaks appreciated.  The left hemithorax was irrigated with saline solution containing antibiotics and then all solution was evacuated. The raw surface of the visceral pleura was sprayed using Tisseel fibrin sealant. The parietal pleura was removed circumferentially around the entire anterior, lateral and posterior surfaces of the left chest.  Hemostasis was ascertained.  The left chest was drained using two 28-French chest tubes placed through separate incisions inferiorly.  Two of these were placed through previous port incision.  The posterolateral thoracotomy was closed in multiple layers in routine fashion.  Prior to closure, the left lung was reinflated and inspected to ensure adequate reinflation and reexpansion of both the upper and lower lobes.  The posterolateral  thoracotomy incision was closed in multiple layers  in routine fashion and the skin incision was closed with a subcuticular  skin closures.  The chest tubes were fixed to a closed suction drainage device.  The patient tolerated the procedure well.  A thoracic epidural catheter was placed by the anesthesia service after completion of the operation.  The patient was turned to the supine position and extubated in the operating room. He was transported the recovery room in stable condition.  There were no intraoperative complications.  All sponge, instrument and needle counts were verified correct at completion of the operation. Estimated blood loss for the procedure was less than 100 cc. DD:  05/02/00 TD:  05/02/00 Job: [id number]

## 2010-09-05 ENCOUNTER — Ambulatory Visit (INDEPENDENT_AMBULATORY_CARE_PROVIDER_SITE_OTHER): Payer: 59 | Admitting: Emergency Medicine

## 2010-09-05 ENCOUNTER — Encounter: Payer: Self-pay | Admitting: Emergency Medicine

## 2010-09-05 VITALS — BP 120/78 | HR 81 | Temp 98.1°F | Ht 67.0 in | Wt 188.4 lb

## 2010-09-05 DIAGNOSIS — J449 Chronic obstructive pulmonary disease, unspecified: Secondary | ICD-10-CM

## 2010-09-05 MED ORDER — FLUTICASONE-SALMETEROL 250-50 MCG/DOSE IN AEPB: 1.0000 | INHALATION_SPRAY | Freq: Two times a day (BID) | RESPIRATORY_TRACT | Status: DC

## 2010-09-05 MED ORDER — ALBUTEROL SULFATE HFA 108 (90 BASE) MCG/ACT IN AERS: 2.0000 | INHALATION_SPRAY | RESPIRATORY_TRACT | Status: DC | PRN

## 2010-09-05 NOTE — Assessment & Plan Note (Signed)
Continue same meds Rov 1 yr or prn

## 2010-09-05 NOTE — Patient Instructions (Signed)
We will continue your Advair and ProAir as you are taking them  Follow up with Dr Delton Coombes in 1 year.  Call if you have any problems with your breathing.

## 2010-09-05 NOTE — Progress Notes (Signed)
  Subjective:    Patient ID: TARL CEPHAS, male    DOB: 10/21/1955, 55 y.o.   MRN: [id number]  HPI Mr. Hollister is a 55 year old gentleman with a history of COPD and bullous emphysema status post bilateral bullectomies in  2002.    01/15/2008--Complains of 2 weeks of cough, congestion, blood tinged initially. Went to ER CXR and CT chest showed 4 R broken ribs, negative for PE. bilateral scarring, and questionable opacities. Started on Avelox for 4 days.(per pt ). Some better but still has coughing fits with thick mucus. and DOE, no energy. Pain in ribs with coughng. finished avelox 2 days. ago.   02/04/08 - returns today for f/u, has completed avelox. CP is better. Breathing is better. Cough and hemoptysis have resolved. Seldom uses ProAir.   ROV 06/01/09 -- Returns for f/u, last seen 2009. His breathing has been stable. Tells me he ran out of Advair about 2 weeks ago, noticed that his SABA use increased some. Has had some anxiety at night about not getting his meds but breathing ok. No exacerbations since last visit. No smoking. No cough or hemoptysis.   ROV 09/05/10 -- Hx COPD, emphysema and s/p bullectomies. Follows up for annual check. No flares since last visit. Uses Advair bid, ProAir prn. He believes he would miss the Advair if stopped. Uses ProAir once every several days. Occas wheze, no real cough. Occas gray sputum. No CP. Needs his meds refilled.    Review of Systems As per HPI    Objective:   Physical Exam Gen: Pleasant, well-nourished, in no distress,  normal affect  ENT: No lesions,  mouth clear,  oropharynx clear, no postnasal drip  Neck: No JVD, no TMG, no carotid bruits  Lungs: No use of accessory muscles, no dullness to percussion, clear without rales or rhonchi  Cardiovascular: RRR, heart sounds normal, no murmur or gallops, no peripheral edema  Musculoskeletal: No deformities, no cyanosis or clubbing  Neuro: alert, non focal  Skin: Warm, no lesions or rashes         Assessment & Plan:  COPD Continue same meds Rov 1 yr or prn

## 2011-01-03 LAB — CBC
Hemoglobin: 14.9
Platelets: 249
RBC: 4.57
RDW: 13.4
WBC: 11.4 — ABNORMAL HIGH

## 2011-01-03 LAB — DIFFERENTIAL
Lymphocytes Relative: 24
Lymphs Abs: 2.7
Monocytes Relative: 9
Neutro Abs: 7.4

## 2011-01-03 LAB — BASIC METABOLIC PANEL
BUN: 17
Calcium: 9
Chloride: 107
GFR calc Af Amer: >60
Glucose, Bld: 115 — ABNORMAL HIGH

## 2011-01-03 LAB — CK TOTAL AND CKMB (NOT AT ARMC)
CK, MB: 5.4 — ABNORMAL HIGH
Relative Index: 0.3

## 2011-01-03 LAB — TROPONIN I: Troponin I: 0.02

## 2011-01-03 LAB — D-DIMER, QUANTITATIVE: D-Dimer, Quant: 0.91 — ABNORMAL HIGH

## 2011-02-07 ENCOUNTER — Encounter: Payer: Self-pay | Admitting: Emergency Medicine

## 2011-02-07 ENCOUNTER — Ambulatory Visit (INDEPENDENT_AMBULATORY_CARE_PROVIDER_SITE_OTHER): Payer: Managed Care, Other (non HMO) | Admitting: Emergency Medicine

## 2011-02-07 VITALS — BP 124/74 | HR 91 | Temp 98.1°F | Ht 67.0 in | Wt 193.8 lb

## 2011-02-07 DIAGNOSIS — J449 Chronic obstructive pulmonary disease, unspecified: Secondary | ICD-10-CM

## 2011-02-07 NOTE — Assessment & Plan Note (Signed)
-   continue the Advair bid + prn SABA - trial of mucinex to see if this loosens his cough - rov 6 months

## 2011-02-07 NOTE — Progress Notes (Signed)
  Subjective:    Patient ID: Danny [name], male    DOB: 1955/05/18, 55 y.o.   MRN: [id number]  HPI Danny [name] is a 55 year old gentleman with a history of COPD and bullous emphysema status post bilateral bullectomies in  2002.    01/15/2008--Complains of 2 weeks of cough, congestion, blood tinged initially. Went to ER CXR and CT chest showed 4 R broken ribs, negative for PE. bilateral scarring, and questionable opacities. Started on Avelox for 4 days.(per pt ). Some better but still has coughing fits with thick mucus. and DOE, no energy. Pain in ribs with coughng. finished avelox 2 days. ago.   02/04/08 - returns today for f/u, has completed avelox. CP is better. Breathing is better. Cough and hemoptysis have resolved. Seldom uses ProAir.   ROV 06/01/09 -- Returns for f/u, last seen 2009. His breathing has been stable. Tells me he ran out of Advair about 2 weeks ago, noticed that his SABA use increased some. Has had some anxiety at night about not getting his meds but breathing ok. No exacerbations since last visit. No smoking. No cough or hemoptysis.   ROV 09/05/10 -- Hx COPD, emphysema and s/p bullectomies. Follows up for annual check. No flares since last visit. Uses Advair bid, ProAir prn. He believes he would miss the Advair if stopped. Uses ProAir once every several days. Occas wheze, no real cough. Occas gray sputum. No CP. Needs his meds refilled.   ROV 02/07/11 -- COPD, bullous emphysema. Presents today c/o more congestion, thick sputum, over about 5 months. He has been more active, and wonders if this is a cause. The mucous if thick yellow. Still able to work. Denies much dyspnea. No exacerbations. Uses his SABA a couple times a week.   Review of Systems As per HPI     Objective:   Physical Exam Gen: Pleasant, well-nourished, in no distress,  normal affect  ENT: No lesions,  mouth clear,  oropharynx clear, no postnasal drip  Neck: No JVD, no TMG, no carotid bruits  Lungs: No use of  accessory muscles, no dullness to percussion, clear without rales or rhonchi  Cardiovascular: RRR, heart sounds normal, no murmur or gallops, no peripheral edema  Musculoskeletal: No deformities, no cyanosis or clubbing  Neuro: alert, non focal  Skin: Warm, no lesions or rashes   Assessment & Plan:  COPD - continue the Advair bid + prn SABA - trial of mucinex to see if this loosens his cough - rov 6 months

## 2011-02-07 NOTE — Patient Instructions (Signed)
Please continue your Advair as you are taking it Use your rescue inhaler as needed Start using mucinex 600mg  once or twice a day to see if it loosens up your cough Call if your breathing and cough are not improving Otherwise follow up in 6 months

## 2011-09-14 ENCOUNTER — Ambulatory Visit: Payer: Managed Care, Other (non HMO) | Admitting: Emergency Medicine

## 2011-09-20 ENCOUNTER — Other Ambulatory Visit: Payer: Self-pay | Admitting: Emergency Medicine

## 2011-09-29 ENCOUNTER — Other Ambulatory Visit: Payer: Self-pay | Admitting: Emergency Medicine

## 2011-10-23 ENCOUNTER — Ambulatory Visit (INDEPENDENT_AMBULATORY_CARE_PROVIDER_SITE_OTHER): Payer: Managed Care, Other (non HMO) | Admitting: Emergency Medicine

## 2011-10-23 ENCOUNTER — Encounter: Payer: Self-pay | Admitting: Emergency Medicine

## 2011-10-23 VITALS — BP 130/98 | HR 88 | Temp 98.3°F | Resp 92 | Ht 67.0 in | Wt 187.6 lb

## 2011-10-23 DIAGNOSIS — J449 Chronic obstructive pulmonary disease, unspecified: Secondary | ICD-10-CM

## 2011-10-23 NOTE — Assessment & Plan Note (Signed)
Continue the advair bid - hasn't tolerated Spiriva SABA prn

## 2011-10-23 NOTE — Progress Notes (Signed)
  Subjective:    Patient ID: Danny [name], male    DOB: Sep 05, 1955, 56 y.o.   MRN: [id number] HPI Danny [name] is a 56 year old gentleman with a history of COPD and bullous emphysema status post bilateral bullectomies in  2002.    01/15/2008--Complains of 2 weeks of cough, congestion, blood tinged initially. Went to ER CXR and CT chest showed 4 R broken ribs, negative for PE. bilateral scarring, and questionable opacities. Started on Avelox for 4 days.(per pt ). Some better but still has coughing fits with thick mucus. and DOE, no energy. Pain in ribs with coughng. finished avelox 2 days. ago.   02/04/08 - returns today for f/u, has completed avelox. CP is better. Breathing is better. Cough and hemoptysis have resolved. Seldom uses ProAir.   ROV 06/01/09 -- Returns for f/u, last seen 2009. His breathing has been stable. Tells me he ran out of Advair about 2 weeks ago, noticed that his SABA use increased some. Has had some anxiety at night about not getting his meds but breathing ok. No exacerbations since last visit. No smoking. No cough or hemoptysis.   ROV 09/05/10 -- Hx COPD, emphysema and s/p bullectomies. Follows up for annual check. No flares since last visit. Uses Advair bid, ProAir prn. He believes he would miss the Advair if stopped. Uses ProAir once every several days. Occas wheze, no real cough. Occas gray sputum. No CP. Needs his meds refilled.   ROV 02/07/11 -- COPD, bullous emphysema. Presents today c/o more congestion, thick sputum, over about 5 months. He has been more active, and wonders if this is a cause. The mucous if thick yellow. Still able to work. Denies much dyspnea. No exacerbations. Uses his SABA a couple times a week.   ROV 10/23/11 -- COPD, bullous emphysema. He tried mucinex since last time. He has had some LE/toe neuropathic changes, ? Some rash on fingers. He stopped the mucinex and the sx are better. Remains on Advair. Uses albuterol rarely.  Coughs but not every day. Some DOE,  happens almost every day.   Review of Systems As per HPI     Objective:   Physical Exam Filed Vitals:   10/23/11 1042  BP: 130/98  Pulse: 88  Temp: 98.3 F (36.8 C)  Resp: 92   Gen: Pleasant, well-nourished, in no distress,  normal affect  ENT: No lesions,  mouth clear,  oropharynx clear, no postnasal drip  Neck: No JVD, no TMG, no carotid bruits  Lungs: No use of accessory muscles, no dullness to percussion, clear without rales or rhonchi  Cardiovascular: RRR, heart sounds normal, no murmur or gallops, no peripheral edema  Musculoskeletal: No deformities, no cyanosis or clubbing  Neuro: alert, non focal  Skin: Warm, no lesions or rashes   Assessment & Plan:  COPD Continue the advair bid - hasn't tolerated Spiriva SABA prn

## 2011-10-23 NOTE — Patient Instructions (Addendum)
Please continue Advair twice a day Use your albuterol as needed Follow with Dr Delton Coombes in 6 months or sooner if you have any problems

## 2011-11-28 ENCOUNTER — Telehealth: Payer: Self-pay | Admitting: Emergency Medicine

## 2011-11-28 NOTE — Telephone Encounter (Signed)
Spoke with pt. He states that he has been using proair more often since last ov. He states that RB advised him to use up to 4 times per day during the summer since he has more trouble with his breathing. He has not really needed to use more than twice daily, but seems to be running out of med quicker and therefore is requesting a new rx for 2 inhalers at a time rather than just one. Please advise, thanks!

## 2011-11-28 NOTE — Telephone Encounter (Signed)
If he is using his SABA this much then something has changed. He needs to be seen to sort it out. In meantime we can give him one time extra albuterol - either sample or call in

## 2011-11-29 MED ORDER — ALBUTEROL SULFATE HFA 108 (90 BASE) MCG/ACT IN AERS: 2.0000 | INHALATION_SPRAY | RESPIRATORY_TRACT | Status: DC | PRN

## 2011-11-29 NOTE — Telephone Encounter (Signed)
Called and spoke with pt and he stated that the inhalers are just not lasting---he is not using the inhaler more than he should.  He is requesting that he have 2 inhalers so these will last for him. He stated that the inhalers are not lasting due to not having enough meds in it. He is aware that we can send a refill to his pharmacy.  Pt stated that he does not need an appt at this time.  He stated that he has been doing great .  He is going to try to see how he does in the next week and pt will call back for any problems.

## 2012-03-14 ENCOUNTER — Telehealth: Payer: Self-pay | Admitting: Emergency Medicine

## 2012-03-14 NOTE — Telephone Encounter (Signed)
I spoke with pt and he stated he was just calling to make an appt to see RB for a follow up. i scheduled pt 04/09/11 at 4:15. Nothing further was needed

## 2012-04-08 ENCOUNTER — Ambulatory Visit: Payer: Managed Care, Other (non HMO) | Admitting: Emergency Medicine

## 2012-04-17 ENCOUNTER — Other Ambulatory Visit: Payer: Self-pay | Admitting: Emergency Medicine

## 2012-04-24 ENCOUNTER — Ambulatory Visit (INDEPENDENT_AMBULATORY_CARE_PROVIDER_SITE_OTHER): Payer: Managed Care, Other (non HMO) | Admitting: Emergency Medicine

## 2012-04-24 ENCOUNTER — Encounter: Payer: Self-pay | Admitting: Emergency Medicine

## 2012-04-24 VITALS — BP 128/84 | HR 108 | Temp 97.4°F | Ht 67.0 in | Wt 186.2 lb

## 2012-04-24 DIAGNOSIS — J4489 Other specified chronic obstructive pulmonary disease: Secondary | ICD-10-CM

## 2012-04-24 DIAGNOSIS — J449 Chronic obstructive pulmonary disease, unspecified: Secondary | ICD-10-CM

## 2012-04-24 NOTE — Progress Notes (Signed)
  Subjective:    Patient ID: Danny [name], male    DOB: 02/22/1956, 57 y.o.   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    01/15/2008--Complains of 2 weeks of cough, congestion, blood tinged initially. Went to ER CXR and CT chest showed 4 R broken ribs, negative for PE. bilateral scarring, and questionable opacities. Started on Avelox for 4 days.(per pt ). Some better but still has coughing fits with thick mucus. and DOE, no energy. Pain in ribs with coughng. finished avelox 2 days. ago.   02/04/08 - returns today for f/u, has completed avelox. CP is better. Breathing is better. Cough and hemoptysis have resolved. Seldom uses ProAir.   ROV 06/01/09 -- Returns for f/u, last seen 2009. His breathing has been stable. Tells me he ran out of Advair about 2 weeks ago, noticed that his SABA use increased some. Has had some anxiety at night about not getting his meds but breathing ok. No exacerbations since last visit. No smoking. No cough or hemoptysis.   ROV 09/05/10 -- Hx COPD, emphysema and s/p bullectomies. Follows up for annual check. No flares since last visit. Uses Advair bid, ProAir prn. He believes he would miss the Advair if stopped. Uses ProAir once every several days. Occas wheze, no real cough. Occas gray sputum. No CP. Needs his meds refilled.   ROV 02/07/11 -- COPD, bullous emphysema. Presents today c/o more congestion, thick sputum, over about 5 months. He has been more active, and wonders if this is a cause. The mucous if thick yellow. Still able to work. Denies much dyspnea. No exacerbations. Uses his SABA a couple times a week.   ROV 10/23/11 -- COPD, bullous emphysema. He tried mucinex since last time. He has had some LE/toe neuropathic changes, ? Some rash on fingers. He stopped the mucinex and the sx are better. Remains on Advair. Uses albuterol rarely.  Coughs but not every day. Some DOE,  happens almost every day.   ROV 04/24/12 -- COPD, bullous emphysema s/p bullectomies. Returns for f/u.  He had a recent URI that caused probable AE, was rx with abx, also started on a trial of Spiriva x 1 week. He was already on Advair. The URI sx are better. Uses SABA typically 1-2 x a day. He is trying to get back to exercising.   Review of Systems As per HPI     Objective:   Physical Exam Filed Vitals:   04/24/12 1500  BP: 128/84  Pulse: 108  Temp: 97.4 F (36.3 C)   Gen: Pleasant, well-nourished, in no distress,  normal affect  ENT: No lesions,  mouth clear,  oropharynx clear, no postnasal drip  Neck: No JVD, no TMG, no carotid bruits  Lungs: No use of accessory muscles, no dullness to percussion, clear without rales or rhonchi  Cardiovascular: RRR, heart sounds normal, no murmur or gallops, no peripheral edema  Musculoskeletal: No deformities, no cyanosis or clubbing  Neuro: alert, non focal  Skin: Warm, no lesions or rashes   Assessment & Plan:  COPD - continue Advair - trial starting Spiriva x 1 month to assess improvement - will repeat spirometry for baseline - albuterol prn - rov 1 month

## 2012-04-24 NOTE — Assessment & Plan Note (Addendum)
-   continue Advair - trial starting Spiriva x 1 month to assess improvement - will repeat spirometry for baseline - albuterol prn - rov 1 month

## 2012-04-24 NOTE — Addendum Note (Signed)
Addended by: Caryl Ada on: 04/24/2012 03:35 PM   Modules accepted: Orders

## 2012-04-24 NOTE — Patient Instructions (Addendum)
Please continue your Advair  We will start Spiriva one inhalation daily for a month to see if your breathing benefits.  Use albuterol as needed We will perform spirometry today to compare with your priors Follow with Dr Delton Coombes in 1 month

## 2012-05-08 ENCOUNTER — Telehealth: Payer: Self-pay | Admitting: Emergency Medicine

## 2012-05-08 NOTE — Telephone Encounter (Signed)
Spoke with patient, patient would like to know if records were sent to [facility] from [facility]. Informed him that records have been sent and placed in RB's look at and will available for him at patients OV Fri 05/10/12.

## 2012-05-10 ENCOUNTER — Encounter: Payer: Self-pay | Admitting: Emergency Medicine

## 2012-05-10 ENCOUNTER — Telehealth: Payer: Self-pay | Admitting: Emergency Medicine

## 2012-05-10 ENCOUNTER — Ambulatory Visit (INDEPENDENT_AMBULATORY_CARE_PROVIDER_SITE_OTHER): Payer: Managed Care, Other (non HMO) | Admitting: Emergency Medicine

## 2012-05-10 VITALS — BP 126/80 | HR 93 | Temp 98.1°F | Ht 67.0 in | Wt 186.2 lb

## 2012-05-10 DIAGNOSIS — J449 Chronic obstructive pulmonary disease, unspecified: Secondary | ICD-10-CM

## 2012-05-10 DIAGNOSIS — I2510 Atherosclerotic heart disease of native coronary artery without angina pectoris: Secondary | ICD-10-CM | POA: Insufficient documentation

## 2012-05-10 MED ORDER — IPRATROPIUM-ALBUTEROL 0.5-2.5 (3) MG/3ML IN SOLN: 3.0000 mL | Freq: Four times a day (QID) | RESPIRATORY_TRACT | Status: DC

## 2012-05-10 NOTE — Progress Notes (Signed)
Subjective:    Patient ID: Danny [name], male    DOB: 04/16/1955, 57 y.o.   MRN: [id number] HPI Mr. 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    01/15/2008--Complains of 2 weeks of cough, congestion, blood tinged initially. Went to ER CXR and CT chest showed 4 R broken ribs, negative for PE. bilateral scarring, and questionable opacities. Started on Avelox for 4 days.(per pt ). Some better but still has coughing fits with thick mucus. and DOE, no energy. Pain in ribs with coughng. finished avelox 2 days. ago.   02/04/08 - returns today for f/u, has completed avelox. CP is better. Breathing is better. Cough and hemoptysis have resolved. Seldom uses ProAir.   ROV 06/01/09 -- Returns for f/u, last seen 2009. His breathing has been stable. Tells me he ran out of Advair about 2 weeks ago, noticed that his SABA use increased some. Has had some anxiety at night about not getting his meds but breathing ok. No exacerbations since last visit. No smoking. No cough or hemoptysis.   ROV 09/05/10 -- Hx COPD, emphysema and s/p bullectomies. Follows up for annual check. No flares since last visit. Uses Advair bid, ProAir prn. He believes he would miss the Advair if stopped. Uses ProAir once every several days. Occas wheze, no real cough. Occas gray sputum. No CP. Needs his meds refilled.   ROV 02/07/11 -- COPD, bullous emphysema. Presents today c/o more congestion, thick sputum, over about 5 months. He has been more active, and wonders if this is a cause. The mucous if thick yellow. Still able to work. Denies much dyspnea. No exacerbations. Uses his SABA a couple times a week.   ROV 10/23/11 -- COPD, bullous emphysema. He tried mucinex since last time. He has had some LE/toe neuropathic changes, ? Some rash on fingers. He stopped the mucinex and the sx are better. Remains on Advair. Uses albuterol rarely.  Coughs but not every day. Some DOE,  happens almost every day.   ROV 04/24/12 -- COPD, bullous emphysema s/p bullectomies. Returns for f/u.  He had a recent URI that caused probable AE, was rx with abx, also started on a trial of Spiriva x 1 week. He was already on Advair. The URI sx are better. Uses SABA typically 1-2 x a day. He is trying to get back to exercising.   ROV 05/10/12 -- COPD, bullous emphysema s/p bullectomies. Was recently evaluated w cardiac cath at [facility], had NSTEMI and cath >> R dominant system, stent placed in the circumflex. LVEDP was normal, RV was dilated. He is improved, was discharged on O2. His Spiriva was stopped and he was changed to DuoNebs during that hospitalization. His Advair was continued.  Feels drained but stable  Objective:   Physical Exam Filed Vitals:   05/10/12 0925  BP: 126/80  Pulse: 93  Temp: 98.1 F (36.7 C)   Gen: Pleasant, well-nourished, in no distress,  normal affect  ENT: No lesions,  mouth clear,  oropharynx clear, no postnasal drip  Neck: No JVD, no TMG, no carotid bruits  Lungs: No use of accessory muscles, no dullness to percussion, clear without rales or rhonchi  Cardiovascular: RRR, heart sounds normal, no murmur or gallops, 2-3+ LE edema to the mid shin  Musculoskeletal: No deformities, no cyanosis or clubbing  Neuro: alert, non focal  Skin: Warm, no lesions or rashes   Assessment & Plan:  COPD Continue O2 for now Refer to cardiopulm rehab Continue Advair Continue duonebs qid Hold Spiriva for now while on DuoNebs.    CAD (coronary artery disease) S/p stenting. C/b systolic CHF.  - he is planning to be referred to [facility] cards in [place] - likely needs his lasix adjusted, recheck TTE in the future to assess for improvement in systolic fxn post-MI   Levy Pupa, MD, PhD 05/10/2012, 10:02 AM  Pulmonary and Critical Care [phone] or if no answer [phone]

## 2012-05-10 NOTE — Assessment & Plan Note (Addendum)
S/p stenting. C/b systolic CHF.  - he is planning to be referred to [facility] cards in [place] - likely needs his lasix adjusted, recheck TTE in the future to assess for improvement in systolic fxn post-MI

## 2012-05-10 NOTE — Telephone Encounter (Signed)
Diane Coad from [employer] called back.  She said to disregard the message about pt having a PFT, but needs to speak to a nurse about how we put the orders for rehab in Epic.  She can be reached @ [phone]. Leanora Ivanoff

## 2012-05-10 NOTE — Patient Instructions (Addendum)
Please continue your Advair twice a day Use your DuoNebs 4 times a day Wear your oxygen as directed. We will recheck your levels and consider stopping this in the future We will arrange for pulmonary rehab in [place] Follow with Dr Delton Coombes in 6 weeks or sooner if you have any problems

## 2012-05-10 NOTE — Telephone Encounter (Signed)
Called and spoke with pt and he wanted to make sure that RB had told him today that he had a heart attack.  Pt  Stated that he is to follow up with cardiology in [place] and to start pulm rehab.  Nothing further is needed.

## 2012-05-10 NOTE — Assessment & Plan Note (Signed)
Continue O2 for now Refer to cardiopulm rehab Continue Advair Continue duonebs qid Hold Spiriva for now while on DuoNebs.

## 2012-05-10 NOTE — Telephone Encounter (Signed)
lmomtcb for Danny [name]

## 2012-05-13 NOTE — Telephone Encounter (Signed)
lmomtcb x 2  

## 2012-05-14 NOTE — Telephone Encounter (Signed)
I am closing this I already called Diane this am and Left her a VM explaining to her in detail how we order pulm rehab ref

## 2012-05-22 ENCOUNTER — Encounter: Payer: Self-pay | Admitting: *Deleted

## 2012-05-23 ENCOUNTER — Encounter: Payer: Self-pay | Admitting: *Deleted

## 2012-05-23 ENCOUNTER — Encounter: Payer: Self-pay | Admitting: Cardiovascular Disease

## 2012-05-23 ENCOUNTER — Ambulatory Visit (INDEPENDENT_AMBULATORY_CARE_PROVIDER_SITE_OTHER): Payer: Managed Care, Other (non HMO) | Admitting: Cardiovascular Disease

## 2012-05-23 VITALS — BP 112/74 | HR 80 | Ht 67.0 in | Wt 175.0 lb

## 2012-05-23 DIAGNOSIS — J449 Chronic obstructive pulmonary disease, unspecified: Secondary | ICD-10-CM

## 2012-05-23 DIAGNOSIS — R0609 Other forms of dyspnea: Secondary | ICD-10-CM

## 2012-05-23 DIAGNOSIS — I2510 Atherosclerotic heart disease of native coronary artery without angina pectoris: Secondary | ICD-10-CM

## 2012-05-23 MED ORDER — NEBIVOLOL HCL 5 MG PO TABS: 5.0000 mg | ORAL_TABLET | Freq: Every day | ORAL | Status: DC

## 2012-05-23 NOTE — Patient Instructions (Addendum)
Your physician recommends that you schedule a follow-up appointment in: 3 MONTHS  Your physician has recommended you make the following change in your medication:  1 - Stop Lisinopril 2 - INCREASE Bystolic to 5 mg daily  Your physician has requested that you have an echocardiogram. Echocardiography is a painless test that uses sound waves to create images of your heart. It provides your doctor with information about the size and shape of your heart and how well your heart's chambers and valves are working. This procedure takes approximately one hour. There are no restrictions for this procedure.

## 2012-05-23 NOTE — Progress Notes (Signed)
Patient ID: Danny [name], male   DOB: 03-Feb-1956, 57 y.o.   MRN: [id number] 57 yo with history of bullous COPD  History of bulectomy and bilateral pneumothorices.  Was in [place] area last month and had more dyspnea.  Seen at [facility] and had stent to coronary D/C with oxygen and still has some exertional desaturation.  He does not complain about chest pain.  No previous history of CAD.  Not clear what EF was.  Due to f/u with Dr Delton Coombes to see if he can come off oxygen since he was not on it pior to recent hospitalization  Compliant with meds.  No fever or cough  ROS: Denies fever, malais, weight loss, blurry vision, decreased visual acuity, cough, sputum, SOB, hemoptysis, pleuritic pain, palpitaitons, heartburn, abdominal pain, melena, lower extremity edema, claudication, or rash.  All other systems reviewed and negative   General: Affect appropriate Chronically ill black male HEENT: normal Neck supple with no adenopathy JVP normal no bruits no thyromegaly Lungs distant  no wheezing and good diaphragmatic motion Heart:  S1/S2 no murmur,rub, gallop or click PMI normal Abdomen: benighn, BS positve, no tenderness, no AAA no bruit.  No HSM or HJR Distal pulses intact with no bruits Trace bilateral  edema Neuro non-focal Skin warm and dry No muscular weakness  Medications Current Outpatient Prescriptions  Medication Sig Dispense Refill  . acetaminophen (TYLENOL) 325 MG tablet Take 650 mg by mouth every 6 (six) hours as needed.      Marland Kitchen ADVAIR DISKUS 250-50 MCG/DOSE AEPB INHALE ONE DOSE BY MOUTH TWICE DAILY  60 each  3  . amLODipine (NORVASC) 5 MG tablet Take 10 mg by mouth daily.       Marland Kitchen aspirin EC 81 MG tablet Take 81 mg by mouth daily.      . furosemide (LASIX) 20 MG tablet Take 20 mg by mouth daily.      Marland Kitchen losartan (COZAAR) 100 MG tablet Take 100 mg by mouth daily.      . Multiple Vitamins-Minerals (MENS MULTIVITAMIN PLUS) TABS Take by mouth daily.      . nebivolol (BYSTOLIC) 2.5 MG tablet  Take 2.5 mg by mouth daily.      Marland Kitchen omeprazole (PRILOSEC) 20 MG capsule Take 20 mg by mouth daily.      . potassium chloride (K-DUR) 10 MEQ tablet       . prasugrel (EFFIENT) 10 MG TABS Take 10 mg by mouth daily.      Marland Kitchen PROAIR HFA 108 (90 BASE) MCG/ACT inhaler INHALE TWO PUFFS EVERY 4 HOURS AS NEEDED  9 g  2  . rosuvastatin (CRESTOR) 10 MG tablet Take 10 mg by mouth daily.      Marland Kitchen ipratropium-albuterol (DUONEB) 0.5-2.5 (3) MG/3ML SOLN Take 3 mLs by nebulization 4 (four) times daily.  360 mL  11   No current facility-administered medications for this visit.    Allergies Lipitor  Family History: No family history on file.  Social History: History   Social History  . Marital Status: Married    Spouse Name: N/A    Number of Children: N/A  . Years of Education: N/A   Occupational History  . Not on file.   Social History Main Topics  . Smoking status: Former Smoker -- 2.00 packs/day for 25 years    Types: Cigarettes    Quit date: 04/04/1995  . Smokeless tobacco: Never Used  . Alcohol Use: Not on file  . Drug Use: Not on file  .  Sexually Active: Not on file   Other Topics Concern  . Not on file   Social History Narrative  . No narrative on file    Electrocardiogram:  Assessment and Plan

## 2012-05-23 NOTE — Assessment & Plan Note (Addendum)
Need records from [place].  Concerning that he had dyspnea and not chest pain as this will make following CAD difficult.  Continue ASA and Effient  F/U echo to assess RV and LV function  D/C calcium blocker and increase bystolic for relative tachycardia post MI

## 2012-05-23 NOTE — Assessment & Plan Note (Signed)
F/U Dr Delton Coombes and try to ween oxygen Continue rehab.  Continue inhalers

## 2012-05-24 ENCOUNTER — Telehealth: Payer: Self-pay | Admitting: Emergency Medicine

## 2012-05-24 ENCOUNTER — Ambulatory Visit (HOSPITAL_COMMUNITY)
Admission: RE | Admit: 2012-05-24 | Discharge: 2012-05-24 | Disposition: A | Discharge status: Home / Self Care | Payer: Managed Care, Other (non HMO) | Source: Ambulatory Visit | Attending: Cardiovascular Disease | Admitting: Cardiovascular Disease

## 2012-05-24 DIAGNOSIS — R0989 Other specified symptoms and signs involving the circulatory and respiratory systems: Secondary | ICD-10-CM | POA: Insufficient documentation

## 2012-05-24 DIAGNOSIS — I517 Cardiomegaly: Secondary | ICD-10-CM

## 2012-05-24 DIAGNOSIS — R0609 Other forms of dyspnea: Secondary | ICD-10-CM | POA: Insufficient documentation

## 2012-05-24 DIAGNOSIS — I2510 Atherosclerotic heart disease of native coronary artery without angina pectoris: Secondary | ICD-10-CM | POA: Insufficient documentation

## 2012-05-24 DIAGNOSIS — I10 Essential (primary) hypertension: Secondary | ICD-10-CM | POA: Insufficient documentation

## 2012-05-24 NOTE — Telephone Encounter (Signed)
Referral for Pulmonary Rehab at [facility] was placed and faxed on 05/10/12. Order was re-faxed to [facility] today. Called and spoke with Diane at [facility] who stated that she did receive order and that patient would be the first one she called to schedule first part of next week. Called and spoke with patient who is aware that we have re-faxed the order and that he should here something from Pulmonary Rehab next week to schedule. Pt advised to call us back if he didn't. Roderic Palau Cobb]

## 2012-05-24 NOTE — Telephone Encounter (Signed)
Order was never placed. I have placed order. Please advise PCC's thanks

## 2012-05-24 NOTE — Progress Notes (Signed)
*  PRELIMINARY RESULTS* Echocardiogram 2D Echocardiogram has been performed.  Danny [name] 05/24/2012, 9:39 AM

## 2012-05-28 ENCOUNTER — Ambulatory Visit: Payer: Managed Care, Other (non HMO) | Admitting: Emergency Medicine

## 2012-05-30 ENCOUNTER — Encounter (HOSPITAL_COMMUNITY)
Admission: RE | Admit: 2012-05-30 | Discharge: 2012-05-30 | Disposition: A | Discharge status: Home / Self Care | Payer: Managed Care, Other (non HMO) | Source: Ambulatory Visit | Attending: Emergency Medicine | Admitting: Emergency Medicine

## 2012-05-30 ENCOUNTER — Encounter (HOSPITAL_COMMUNITY): Payer: Self-pay

## 2012-05-30 DIAGNOSIS — Z5189 Encounter for other specified aftercare: Secondary | ICD-10-CM | POA: Insufficient documentation

## 2012-05-30 DIAGNOSIS — J4489 Other specified chronic obstructive pulmonary disease: Secondary | ICD-10-CM | POA: Insufficient documentation

## 2012-05-30 NOTE — Patient Instructions (Signed)
Pt has finished orientation and is scheduled to start PR on 05/30/12. Pt has been instructed to arrive to class 15 minutes early for scheduled class. Pt has been instructed to wear comfortable clothing and shoes with rubber soles. Pt has been told to take their medications 1 hour prior to coming to class.  If the patient is not going to attend class, he/she has been instructed to call.

## 2012-05-30 NOTE — Progress Notes (Signed)
Patient was referred to Pulmonary Rehab by Dr. Delton Coombes due to COPD 496. Patient also had MI 410.90 Stent Placement v45.82. During orientation advised patient on arrival and appointment times what to wear, what to do before, during and after exercise. Reviewed attendance and class policy. Talked about inclement weather and class consultation policy. Pt is scheduled to start Pulm Rehab on 05/30/12 at 1 pm. Pt was advised to come to class 5 minutes before class starts. He was also given instructions on meeting with the dietician and attending the Family Structure classes. Pt is eager to get started. Patient was able to do the pre-six minute walk test. Discussed pursed lip breathing.

## 2012-06-03 ENCOUNTER — Encounter (HOSPITAL_COMMUNITY): Admission: RE | Admit: 2012-06-03 | Payer: Managed Care, Other (non HMO) | Source: Ambulatory Visit

## 2012-06-05 ENCOUNTER — Encounter (HOSPITAL_COMMUNITY)
Admission: RE | Admit: 2012-06-05 | Discharge: 2012-06-05 | Disposition: A | Discharge status: Home / Self Care | Payer: Managed Care, Other (non HMO) | Source: Ambulatory Visit | Attending: Emergency Medicine | Admitting: Emergency Medicine

## 2012-06-05 DIAGNOSIS — Z5189 Encounter for other specified aftercare: Secondary | ICD-10-CM | POA: Insufficient documentation

## 2012-06-05 DIAGNOSIS — J4489 Other specified chronic obstructive pulmonary disease: Secondary | ICD-10-CM | POA: Insufficient documentation

## 2012-06-10 ENCOUNTER — Encounter (HOSPITAL_COMMUNITY)
Admission: RE | Admit: 2012-06-10 | Discharge: 2012-06-10 | Disposition: A | Discharge status: Home / Self Care | Payer: Managed Care, Other (non HMO) | Source: Ambulatory Visit | Attending: Emergency Medicine | Admitting: Emergency Medicine

## 2012-06-12 ENCOUNTER — Encounter (HOSPITAL_COMMUNITY)
Admission: RE | Admit: 2012-06-12 | Discharge: 2012-06-12 | Disposition: A | Discharge status: Home / Self Care | Payer: Managed Care, Other (non HMO) | Source: Ambulatory Visit | Attending: Emergency Medicine | Admitting: Emergency Medicine

## 2012-06-17 ENCOUNTER — Encounter (HOSPITAL_COMMUNITY): Payer: Managed Care, Other (non HMO)

## 2012-06-19 ENCOUNTER — Encounter (HOSPITAL_COMMUNITY)
Admission: RE | Admit: 2012-06-19 | Discharge: 2012-06-19 | Disposition: A | Discharge status: Home / Self Care | Payer: Managed Care, Other (non HMO) | Source: Ambulatory Visit | Attending: Emergency Medicine | Admitting: Emergency Medicine

## 2012-06-24 ENCOUNTER — Encounter (HOSPITAL_COMMUNITY)
Admission: RE | Admit: 2012-06-24 | Discharge: 2012-06-24 | Disposition: A | Discharge status: Home / Self Care | Payer: Managed Care, Other (non HMO) | Source: Ambulatory Visit | Attending: Emergency Medicine | Admitting: Emergency Medicine

## 2012-06-25 ENCOUNTER — Ambulatory Visit (INDEPENDENT_AMBULATORY_CARE_PROVIDER_SITE_OTHER): Payer: Managed Care, Other (non HMO) | Admitting: Emergency Medicine

## 2012-06-25 ENCOUNTER — Encounter: Payer: Self-pay | Admitting: Emergency Medicine

## 2012-06-25 VITALS — BP 140/86 | HR 90 | Temp 97.6°F | Ht 67.0 in | Wt 174.2 lb

## 2012-06-25 DIAGNOSIS — J449 Chronic obstructive pulmonary disease, unspecified: Secondary | ICD-10-CM

## 2012-06-25 NOTE — Assessment & Plan Note (Signed)
Will continue current regimen - has not tolerated spiriva.  Encouraged good O2 compliance. Consider w/u for OSA given his R heart dysfxn. Follow 3 mon

## 2012-06-25 NOTE — Progress Notes (Signed)
Subjective:    Patient ID: Danny [name], male    DOB: [birth year], 57 y.o.   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    01/15/2008--Complains of 2 weeks of cough, congestion, blood tinged initially. Went to ER CXR and CT chest showed 4 R broken ribs, negative for PE. bilateral scarring, and questionable opacities. Started on Avelox for 4 days.(per pt ). Some better but still has coughing fits with thick mucus. and DOE, no energy. Pain in ribs with coughng. finished avelox 2 days. ago.   02/04/08 - returns today for f/u, has completed avelox. CP is better. Breathing is better. Cough and hemoptysis have resolved. Seldom uses ProAir.   ROV 06/01/09 -- Returns for f/u, last seen 2009. His breathing has been stable. Tells me Danny [name] ran out of Advair about 2 weeks ago, noticed that his SABA use increased some. Has had some anxiety at night about not getting his meds but breathing ok. No exacerbations since last visit. No smoking. No cough or hemoptysis.   ROV 09/05/10 -- Hx COPD, emphysema and s/p bullectomies. Follows up for annual check. No flares since last visit. Uses Advair bid, ProAir prn. Danny [name] believes Danny [name] would miss the Advair if stopped. Uses ProAir once every several days. Occas wheze, no real cough. Occas gray sputum. No CP. Needs his meds refilled.   ROV 02/07/11 -- COPD, bullous emphysema. Presents today c/o more congestion, thick sputum, over about 5 months. Danny [name] has been more active, and wonders if this is a cause. The mucous if thick yellow. Still able to work. Denies much dyspnea. No exacerbations. Uses his SABA a couple times a week.   ROV 10/23/11 -- COPD, bullous emphysema. Danny [name] tried mucinex since last time. Danny [name] has had some LE/toe neuropathic changes, ? Some rash on fingers. Danny [name] stopped the mucinex and the sx are better. Remains on Advair. Uses albuterol rarely.  Coughs but not every day. Some DOE,  happens almost every day.   ROV 04/24/12 -- COPD, bullous emphysema s/p bullectomies. Returns for f/u.  Danny [name] had a recent URI that caused probable AE, was rx with abx, also started on a trial of Spiriva x 1 week. Danny [name] was already on Advair. The URI sx are better. Uses SABA typically 1-2 x a day. Danny [name] is trying to get back to exercising.   ROV 05/10/12 -- COPD, bullous emphysema s/p bullectomies. Was recently evaluated w cardiac cath at [facility], had NSTEMI and cath >> R dominant system, stent placed in the circumflex. LVEDP was normal, RV was dilated. Danny [name] is improved, was discharged on O2. His Spiriva was stopped and Danny [name] was changed to DuoNebs during that hospitalization. His Advair was continued.  Feels drained but stable  ROV 06/25/12 -- COPD, bullous emphysema s/p bullectomies. CAD w hx NSTEMI. Doing cardiopulm rehab.  Danny [name] has seen Dr Eden Emms to establish in [place]. Danny [name] remains on Advair + Duonebs. Danny [name] didn't tolerate spiriva. 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 Danny [name] is compliant with his O2.    Objective:   Physical Exam Filed Vitals:   06/25/12 1100  BP: 140/86  Pulse: 90  Temp: 97.6 F (36.4 C)   Gen: Pleasant, well-nourished, in no distress,  normal affect  ENT: No lesions,  mouth clear,  oropharynx clear, no postnasal drip  Neck: No JVD, no  TMG, no carotid bruits  Lungs: No use of accessory muscles, no dullness to percussion, clear without rales or rhonchi  Cardiovascular: RRR, heart sounds normal, no murmur or gallops, trace edema > much improved  Musculoskeletal: No deformities, no cyanosis or clubbing  Neuro: alert, non focal  Skin: Warm, no lesions or rashes   05/24/12 --  - Left ventricle: Acoustic windows are difficult. Limit study. LV systolic function is grossly normal. Difficult to evaluate regional wall motion as endocardium is difficult to see. Septal flattening consistent with RV volume/pressure  overload. The cavity size was normal. Wall thickness was increased in a pattern of mild LVH. Doppler parameters are consistent with abnormal left ventricular relaxation (grade 1 diastolic dysfunction). - Right ventricle: The cavity size was severely dilated. Systolic function was severely reduced. - Right atrium: The atrium was moderately dilated.    Assessment & Plan:  COPD Will continue current regimen - has not tolerated spiriva.  Encouraged good O2 compliance. Consider w/u for OSA given his R heart dysfxn. Follow 3 mon    Levy Pupa, MD, PhD 06/25/2012, 11:26 AM [facility] [phone] or if no answer [phone]

## 2012-06-25 NOTE — Patient Instructions (Addendum)
Please continue your current medications  Please wear your oxygen reliably, especially with all exertion.  Follow with Dr Delton Coombes in 3 months or sooner if you have any problems.

## 2012-06-26 ENCOUNTER — Encounter (HOSPITAL_COMMUNITY)
Admission: RE | Admit: 2012-06-26 | Discharge: 2012-06-26 | Disposition: A | Discharge status: Home / Self Care | Payer: Managed Care, Other (non HMO) | Source: Ambulatory Visit | Attending: Emergency Medicine | Admitting: Emergency Medicine

## 2012-07-01 ENCOUNTER — Encounter (HOSPITAL_COMMUNITY)
Admission: RE | Admit: 2012-07-01 | Discharge: 2012-07-01 | Disposition: A | Discharge status: Home / Self Care | Payer: Managed Care, Other (non HMO) | Source: Ambulatory Visit | Attending: Emergency Medicine | Admitting: Emergency Medicine

## 2012-07-03 ENCOUNTER — Encounter (HOSPITAL_COMMUNITY)
Admission: RE | Admit: 2012-07-03 | Discharge: 2012-07-03 | Disposition: A | Discharge status: Home / Self Care | Payer: Managed Care, Other (non HMO) | Source: Ambulatory Visit | Attending: Emergency Medicine | Admitting: Emergency Medicine

## 2012-07-03 DIAGNOSIS — J449 Chronic obstructive pulmonary disease, unspecified: Secondary | ICD-10-CM | POA: Insufficient documentation

## 2012-07-03 DIAGNOSIS — J4489 Other specified chronic obstructive pulmonary disease: Secondary | ICD-10-CM | POA: Insufficient documentation

## 2012-07-03 DIAGNOSIS — Z5189 Encounter for other specified aftercare: Secondary | ICD-10-CM | POA: Insufficient documentation

## 2012-07-08 ENCOUNTER — Encounter (HOSPITAL_COMMUNITY)
Admission: RE | Admit: 2012-07-08 | Discharge: 2012-07-08 | Disposition: A | Discharge status: Home / Self Care | Payer: Managed Care, Other (non HMO) | Source: Ambulatory Visit | Attending: Emergency Medicine | Admitting: Emergency Medicine

## 2012-07-09 ENCOUNTER — Telehealth: Payer: Self-pay | Admitting: Emergency Medicine

## 2012-07-09 NOTE — Telephone Encounter (Signed)
Spoke with pt He is c/o increased diastolic BP readings for the past couple of wks I advised he should contact PCP for this and seek emergent care sooner if needed Pt verbalized understanding

## 2012-07-10 ENCOUNTER — Encounter (HOSPITAL_COMMUNITY)
Admission: RE | Admit: 2012-07-10 | Discharge: 2012-07-10 | Disposition: A | Discharge status: Home / Self Care | Payer: Managed Care, Other (non HMO) | Source: Ambulatory Visit | Attending: Emergency Medicine | Admitting: Emergency Medicine

## 2012-07-15 ENCOUNTER — Encounter (HOSPITAL_COMMUNITY): Payer: Managed Care, Other (non HMO)

## 2012-07-17 ENCOUNTER — Encounter (HOSPITAL_COMMUNITY)
Admission: RE | Admit: 2012-07-17 | Discharge: 2012-07-17 | Disposition: A | Discharge status: Home / Self Care | Payer: Managed Care, Other (non HMO) | Source: Ambulatory Visit | Attending: Emergency Medicine | Admitting: Emergency Medicine

## 2012-07-22 ENCOUNTER — Encounter (HOSPITAL_COMMUNITY): Payer: Managed Care, Other (non HMO)

## 2012-07-22 ENCOUNTER — Encounter (HOSPITAL_COMMUNITY): Payer: Self-pay | Admitting: *Deleted

## 2012-07-24 ENCOUNTER — Encounter (HOSPITAL_COMMUNITY): Payer: Managed Care, Other (non HMO)

## 2012-07-29 ENCOUNTER — Encounter (HOSPITAL_COMMUNITY)
Admission: RE | Admit: 2012-07-29 | Discharge: 2012-07-29 | Disposition: A | Discharge status: Home / Self Care | Payer: Managed Care, Other (non HMO) | Source: Ambulatory Visit | Attending: Emergency Medicine | Admitting: Emergency Medicine

## 2012-07-31 ENCOUNTER — Encounter (HOSPITAL_COMMUNITY)
Admission: RE | Admit: 2012-07-31 | Discharge: 2012-07-31 | Disposition: A | Discharge status: Home / Self Care | Payer: Managed Care, Other (non HMO) | Source: Ambulatory Visit | Attending: Emergency Medicine | Admitting: Emergency Medicine

## 2012-08-05 ENCOUNTER — Encounter (HOSPITAL_COMMUNITY)
Admission: RE | Admit: 2012-08-05 | Discharge: 2012-08-05 | Disposition: A | Discharge status: Home / Self Care | Payer: Managed Care, Other (non HMO) | Source: Ambulatory Visit | Attending: Emergency Medicine | Admitting: Emergency Medicine

## 2012-08-05 DIAGNOSIS — J4489 Other specified chronic obstructive pulmonary disease: Secondary | ICD-10-CM | POA: Insufficient documentation

## 2012-08-05 DIAGNOSIS — J449 Chronic obstructive pulmonary disease, unspecified: Secondary | ICD-10-CM | POA: Insufficient documentation

## 2012-08-05 DIAGNOSIS — Z5189 Encounter for other specified aftercare: Secondary | ICD-10-CM | POA: Insufficient documentation

## 2012-08-07 ENCOUNTER — Encounter (HOSPITAL_COMMUNITY)
Admission: RE | Admit: 2012-08-07 | Discharge: 2012-08-07 | Disposition: A | Discharge status: Home / Self Care | Payer: Managed Care, Other (non HMO) | Source: Ambulatory Visit | Attending: Emergency Medicine | Admitting: Emergency Medicine

## 2012-08-12 ENCOUNTER — Encounter (HOSPITAL_COMMUNITY): Payer: Managed Care, Other (non HMO)

## 2012-08-14 ENCOUNTER — Encounter (HOSPITAL_COMMUNITY)
Admission: RE | Admit: 2012-08-14 | Discharge: 2012-08-14 | Disposition: A | Discharge status: Home / Self Care | Payer: Managed Care, Other (non HMO) | Source: Ambulatory Visit | Attending: Emergency Medicine | Admitting: Emergency Medicine

## 2012-08-18 ENCOUNTER — Other Ambulatory Visit: Payer: Self-pay | Admitting: Emergency Medicine

## 2012-08-19 ENCOUNTER — Encounter (HOSPITAL_COMMUNITY)
Admission: RE | Admit: 2012-08-19 | Discharge: 2012-08-19 | Disposition: A | Discharge status: Home / Self Care | Payer: Managed Care, Other (non HMO) | Source: Ambulatory Visit | Attending: Emergency Medicine | Admitting: Emergency Medicine

## 2012-08-21 ENCOUNTER — Encounter (HOSPITAL_COMMUNITY)
Admission: RE | Admit: 2012-08-21 | Discharge: 2012-08-21 | Disposition: A | Discharge status: Home / Self Care | Payer: Managed Care, Other (non HMO) | Source: Ambulatory Visit | Attending: Emergency Medicine | Admitting: Emergency Medicine

## 2012-08-26 ENCOUNTER — Encounter (HOSPITAL_COMMUNITY): Payer: Managed Care, Other (non HMO)

## 2012-08-28 ENCOUNTER — Encounter (HOSPITAL_COMMUNITY): Payer: Managed Care, Other (non HMO)

## 2012-09-02 ENCOUNTER — Encounter (HOSPITAL_COMMUNITY)
Admission: RE | Admit: 2012-09-02 | Discharge: 2012-09-02 | Disposition: A | Discharge status: Home / Self Care | Payer: Managed Care, Other (non HMO) | Source: Ambulatory Visit | Attending: Emergency Medicine | Admitting: Emergency Medicine

## 2012-09-02 DIAGNOSIS — J4489 Other specified chronic obstructive pulmonary disease: Secondary | ICD-10-CM | POA: Insufficient documentation

## 2012-09-02 DIAGNOSIS — J449 Chronic obstructive pulmonary disease, unspecified: Secondary | ICD-10-CM | POA: Insufficient documentation

## 2012-09-02 DIAGNOSIS — Z5189 Encounter for other specified aftercare: Secondary | ICD-10-CM | POA: Insufficient documentation

## 2012-09-04 ENCOUNTER — Encounter (HOSPITAL_COMMUNITY): Payer: Managed Care, Other (non HMO)

## 2012-09-09 ENCOUNTER — Encounter (HOSPITAL_COMMUNITY): Payer: Managed Care, Other (non HMO)

## 2012-09-11 ENCOUNTER — Encounter (HOSPITAL_COMMUNITY)
Admission: RE | Admit: 2012-09-11 | Discharge: 2012-09-11 | Disposition: A | Discharge status: Home / Self Care | Payer: Managed Care, Other (non HMO) | Source: Ambulatory Visit | Attending: Emergency Medicine | Admitting: Emergency Medicine

## 2012-09-15 ENCOUNTER — Other Ambulatory Visit: Payer: Self-pay | Admitting: Emergency Medicine

## 2012-09-16 ENCOUNTER — Encounter (HOSPITAL_COMMUNITY): Payer: Managed Care, Other (non HMO)

## 2012-09-18 ENCOUNTER — Encounter (HOSPITAL_COMMUNITY)
Admission: RE | Admit: 2012-09-18 | Discharge: 2012-09-18 | Disposition: A | Discharge status: Home / Self Care | Payer: Managed Care, Other (non HMO) | Source: Ambulatory Visit | Attending: Emergency Medicine | Admitting: Emergency Medicine

## 2012-09-18 ENCOUNTER — Ambulatory Visit (INDEPENDENT_AMBULATORY_CARE_PROVIDER_SITE_OTHER): Payer: Managed Care, Other (non HMO) | Admitting: Cardiovascular Disease

## 2012-09-18 ENCOUNTER — Encounter: Payer: Self-pay | Admitting: Cardiovascular Disease

## 2012-09-18 VITALS — BP 136/94 | HR 92 | Ht 67.0 in | Wt 170.0 lb

## 2012-09-18 DIAGNOSIS — I2510 Atherosclerotic heart disease of native coronary artery without angina pectoris: Secondary | ICD-10-CM

## 2012-09-18 MED ORDER — NITROGLYCERIN 0.4 MG SL SUBL: 0.4000 mg | SUBLINGUAL_TABLET | SUBLINGUAL | Status: DC | PRN

## 2012-09-18 NOTE — Progress Notes (Signed)
Patient ID: Danny [name], male   DOB: [birth year], 57 y.o.   MRN: [id number] 57 yo with history of bullous COPD History of bulectomy and bilateral pneumothorices. Was in [place] area last month and had more dyspnea. Seen at [facility] and had stent to coronary D/C with oxygen and still has some exertional desaturation. He does not complain about chest pain. No previous history of CAD. Not clear what EF was. Due to f/u with Dr Delton Coombes to see if he can come off oxygen since he was not on it pior to recent hospitalization Compliant with meds. No fever or cough  Pulmonary rehab helpful Seems like sats are consistantly low with activity  Echo done here 2/14 EF hard to judge but cor pulmonale Study Conclusions  - Left ventricle: Acoustic windows are difficult. Limit study. LV systolic function is grossly normal. Difficult to evaluate regional wall motion as endocardium is difficult to see. Septal flattening consistent with RV volume/pressure overload. The cavity size was normal. Wall thickness was increased in a pattern of mild LVH. Doppler parameters are consistent with abnormal left ventricular relaxation (grade 1 diastolic dysfunction). - Right ventricle: The cavity size was severely dilated. Systolic function was severely reduced. - Right atrium: The atrium was moderately dilated.   ROS: Denies fever, malais, weight loss, blurry vision, decreased visual acuity, cough, sputum, SOB, hemoptysis, pleuritic pain, palpitaitons, heartburn, abdominal pain, melena, lower extremity edema, claudication, or rash.  All other systems reviewed and negative  General: Affect appropriate Chronically ill black male with oxgyen HEENT: normal Neck supple with no adenopathy JVP normal no bruits no thyromegaly Lungs clear with no wheezing and good diaphragmatic motion Heart:  S1/S2 no murmur, no rub, gallop or click PMI normal Abdomen: benighn, BS positve, no tenderness, no AAA no bruit.  No HSM or HJR Distal pulses  intact with no bruits No edema Neuro non-focal Skin warm and dry No muscular weakness   Current Outpatient Prescriptions  Medication Sig Dispense Refill  . acetaminophen (TYLENOL) 325 MG tablet Take 650 mg by mouth every 6 (six) hours as needed.      Marland Kitchen ADVAIR DISKUS 250-50 MCG/DOSE AEPB INHALE ONE DOSE BY MOUTH TWICE DAILY  60 each  6  . aspirin EC 81 MG tablet Take 81 mg by mouth daily.      . furosemide (LASIX) 20 MG tablet Take 20 mg by mouth daily.      Marland Kitchen ipratropium-albuterol (DUONEB) 0.5-2.5 (3) MG/3ML SOLN Take 3 mLs by nebulization 4 (four) times daily.  360 mL  11  . losartan (COZAAR) 100 MG tablet Take 100 mg by mouth daily.      . Multiple Vitamins-Minerals (MENS MULTIVITAMIN PLUS) TABS Take by mouth daily.      . nebivolol (BYSTOLIC) 5 MG tablet Take 1 tablet (5 mg total) by mouth daily.  30 tablet  6  . omeprazole (PRILOSEC) 20 MG capsule Take 20 mg by mouth daily.      . potassium chloride (K-DUR) 10 MEQ tablet       . prasugrel (EFFIENT) 10 MG TABS Take 10 mg by mouth daily.      . predniSONE (DELTASONE) 5 MG tablet 5 mg as needed.       Marland Kitchen PROAIR HFA 108 (90 BASE) MCG/ACT inhaler INHALE TWO PUFFS BY MOUTH EVERY 4 HOURS AS NEEDED  9 each  6  . rosuvastatin (CRESTOR) 10 MG tablet Take 10 mg by mouth daily.       No current facility-administered medications  for this visit.    Allergies  Lipitor  Electrocardiogram: 05/23/12  SR rate 114 RBBB pulmonary disease pattern  Assessment and Plan

## 2012-09-18 NOTE — Assessment & Plan Note (Signed)
Stable with no angina and good activity level.  Continue medical Rx  

## 2012-09-18 NOTE — Patient Instructions (Addendum)
Your physician recommends that you schedule a follow-up appointment in: 6 months  The proper use and anticipated side effects of nitroglycerine has been carefully explained.  If a single episode of chest pain is not relieved by one tablet, the patient will try another within 5 minutes; and if this doesn't relieve the pain, the patient is instructed to call 911 for transportation to an emergency department.

## 2012-09-18 NOTE — Assessment & Plan Note (Signed)
Severe bullous disease Continue oxygen inhaler and rehab  F/U Dr Delton Coombes

## 2012-09-23 ENCOUNTER — Encounter (HOSPITAL_COMMUNITY)
Admission: RE | Admit: 2012-09-23 | Discharge: 2012-09-23 | Disposition: A | Discharge status: Home / Self Care | Payer: Managed Care, Other (non HMO) | Source: Ambulatory Visit | Attending: Emergency Medicine | Admitting: Emergency Medicine

## 2012-09-25 ENCOUNTER — Encounter (HOSPITAL_COMMUNITY): Payer: Managed Care, Other (non HMO)

## 2012-09-26 ENCOUNTER — Encounter: Payer: Self-pay | Admitting: Emergency Medicine

## 2012-09-26 ENCOUNTER — Ambulatory Visit (INDEPENDENT_AMBULATORY_CARE_PROVIDER_SITE_OTHER)
Admission: RE | Admit: 2012-09-26 | Discharge: 2012-09-26 | Disposition: A | Discharge status: Home / Self Care | Payer: Managed Care, Other (non HMO) | Source: Ambulatory Visit | Attending: Emergency Medicine | Admitting: Emergency Medicine

## 2012-09-26 ENCOUNTER — Ambulatory Visit (INDEPENDENT_AMBULATORY_CARE_PROVIDER_SITE_OTHER): Payer: Managed Care, Other (non HMO) | Admitting: Emergency Medicine

## 2012-09-26 VITALS — BP 150/100 | HR 88 | Temp 98.2°F | Ht 67.0 in | Wt 168.2 lb

## 2012-09-26 DIAGNOSIS — J449 Chronic obstructive pulmonary disease, unspecified: Secondary | ICD-10-CM

## 2012-09-26 DIAGNOSIS — J4489 Other specified chronic obstructive pulmonary disease: Secondary | ICD-10-CM

## 2012-09-26 DIAGNOSIS — K219 Gastro-esophageal reflux disease without esophagitis: Secondary | ICD-10-CM

## 2012-09-26 MED ORDER — ACAPELLA MISC: 1.0000 | Status: DC

## 2012-09-26 MED ORDER — FLUTTER DEVI: 1.0000 | Freq: Two times a day (BID) | Status: DC

## 2012-09-26 NOTE — Assessment & Plan Note (Signed)
I think this is the CP and fullness he is experiencing. Would like to treat this, get him off the prednisone. He is using pred prn, ironically may be making GERD worse

## 2012-09-26 NOTE — Assessment & Plan Note (Signed)
Same BD's Reviewed the risks of using pred prn Flutter valve at his request

## 2012-09-26 NOTE — Progress Notes (Signed)
Subjective:    Patient ID: Danny [name], male    DOB: 12/10/1955, 57 y.o.   MRN: [id number] HPI 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    01/15/2008--Complains of 2 weeks of cough, congestion, blood tinged initially. Went to ER CXR and CT chest showed 4 R broken ribs, negative for PE. bilateral scarring, and questionable opacities. Started on Avelox for 4 days.(per pt ). Some better but still has coughing fits with thick mucus. and DOE, no energy. Pain in ribs with coughng. finished avelox 2 days. ago.   02/04/08 - returns today for f/u, has completed avelox. CP is better. Breathing is better. Cough and hemoptysis have resolved. Seldom uses ProAir.   ROV 06/01/09 -- Returns for f/u, last seen 2009. His breathing has been stable. Tells me he ran out of Advair about 2 weeks ago, noticed that his SABA use increased some. Has had some anxiety at night about not getting his meds but breathing ok. No exacerbations since last visit. No smoking. No cough or hemoptysis.   ROV 09/05/10 -- Hx COPD, emphysema and s/p bullectomies. Follows up for annual check. No flares since last visit. Uses Advair bid, ProAir prn. He believes he would miss the Advair if stopped. Uses ProAir once every several days. Occas wheze, no real cough. Occas gray sputum. No CP. Needs his meds refilled.   ROV 02/07/11 -- COPD, bullous emphysema. Presents today c/o more congestion, thick sputum, over about 5 months. He has been more active, and wonders if this is a cause. The mucous if thick yellow. Still able to work. Denies much dyspnea. No exacerbations. Uses his SABA a couple times a week.   ROV 10/23/11 -- COPD, bullous emphysema. He tried mucinex since last time. He has had some LE/toe neuropathic changes, ? Some rash on fingers. He stopped the mucinex and the sx are better. Remains on Advair. Uses albuterol rarely.  Coughs but not every day. Some DOE,  happens almost every day.   ROV 04/24/12 -- COPD, bullous emphysema s/p bullectomies. Returns for f/u.  He had a recent URI that caused probable AE, was rx with abx, also started on a trial of Spiriva x 1 week. He was already on Advair. The URI sx are better. Uses SABA typically 1-2 x a day. He is trying to get back to exercising.   ROV 05/10/12 -- COPD, bullous emphysema s/p bullectomies. Was recently evaluated w cardiac cath at [facility], had NSTEMI and cath >> R dominant system, stent placed in the circumflex. LVEDP was normal, RV was dilated. He is improved, was discharged on O2. His Spiriva was stopped and he was changed to DuoNebs during that hospitalization. His Advair was continued.  Feels drained but stable  ROV 06/25/12 -- COPD, bullous emphysema s/p bullectomies. CAD w hx NSTEMI. Doing cardiopulm rehab.  He has seen Dr Eden Emms to establish in [place]. He remains on Advair + Duonebs. He didn't tolerate spiriva. 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 He is compliant with his O2.   ROV 09/26/12 -- COPD, bullous emphysema s/p bullectomies. CAD w hx NSTEMI, secondary PAH. He reports today that he has had more dyspnea, seems to be associated with some epigastric, lower chest fullness or pain. He reports desats with O2 at 3L/min when exercising. He is on prednisone 5, started by Dr Janna Arch,  that he is using prn. He feels that it is helping with his pain.    Objective:   Physical Exam Filed Vitals:   09/26/12 1408  BP: 150/100  Pulse: 88  Temp: 98.2 F (36.8 C)   Gen: Pleasant, well-nourished, in no distress,  normal affect  ENT: No lesions,  mouth clear,  oropharynx clear, no postnasal drip  Neck: No JVD, no TMG, no carotid bruits  Lungs: No use of accessory muscles, no dullness to percussion, clear without rales or rhonchi  Cardiovascular: RRR, heart sounds normal, no murmur or gallops, trace edema > much  improved  Musculoskeletal: No deformities, no cyanosis or clubbing  Neuro: alert, non focal  Skin: Warm, no lesions or rashes   05/24/12 --  - Left ventricle: Acoustic windows are difficult. Limit study. LV systolic function is grossly normal. Difficult to evaluate regional wall motion as endocardium is difficult to see. Septal flattening consistent with RV volume/pressure overload. The cavity size was normal. Wall thickness was increased in a pattern of mild LVH. Doppler parameters are consistent with abnormal left ventricular relaxation (grade 1 diastolic dysfunction). - Right ventricle: The cavity size was severely dilated. Systolic function was severely reduced. - Right atrium: The atrium was moderately dilated.    Assessment & Plan:  GERD I think this is the CP and fullness he is experiencing. Would like to treat this, get him off the prednisone. He is using pred prn, ironically may be making GERD worse  COPD Same BD's Reviewed the risks of using pred prn Flutter valve at his request   Levy Pupa, MD, PhD 09/26/2012, 2:52 PM [facility] [phone] or if no answer [phone]

## 2012-09-26 NOTE — Patient Instructions (Addendum)
Please continue your inhaled medications and oxygen You would probably benefit from coming off prednisone. This is a dangerous medication to take as needed.  We will repeat your echocardiogram in February.  Please increase omeprazole (Prilosec) to 20mg  twice a day Follow with Dr Delton Coombes in 3 months or sooner if you have any problems.

## 2012-09-30 ENCOUNTER — Encounter (HOSPITAL_COMMUNITY)
Admission: RE | Admit: 2012-09-30 | Discharge: 2012-09-30 | Disposition: A | Discharge status: Home / Self Care | Payer: Managed Care, Other (non HMO) | Source: Ambulatory Visit | Attending: Emergency Medicine | Admitting: Emergency Medicine

## 2012-10-01 ENCOUNTER — Telehealth: Payer: Self-pay | Admitting: *Deleted

## 2012-10-01 DIAGNOSIS — I10 Essential (primary) hypertension: Secondary | ICD-10-CM

## 2012-10-01 DIAGNOSIS — R0602 Shortness of breath: Secondary | ICD-10-CM

## 2012-10-01 NOTE — Telephone Encounter (Signed)
Pt came into office to advise he was leaving pulmonary rehab and has some concerns with his edema/BP/SOB,pt offered an apt with DR PN tomorrow, pt declined apt per TS and wanted to speak with a nurse to see if the Dr could just adjust his diuretic, noted pt on 2l oxygen currently, pt spoke to this nurse with no SOB noted, pt advised he was taking prednisone PRN until last Thursday per Dr Delton Coombes when Inis Sizer advised to STOP taking the prednisone, pt was complaining to Dr Delton Coombes about heartburn, pt increase prilosec and the heartburn has ceased however pt notes since stopping the prednisone PRN he notes SOB upon exhertion only, even as simple as folding clothes, pt advised concern with BP elevation throughout his day on and off for the past 4-5 months, pt spoke to Dr PN about this concern and was placed on nitro however pt still noting elevation in BP on and off, pt notes the bottom number ranging 90's to 100, pt also noted this is recorded in his chart per pulmonary rehab as to reached as high as 120 once, pt noted to have swelling of 2+ pitting edema in both feet, pt advised to go to the ED if any sxs worsen, pt notes he feels fine today, he just wants the concerns addressed so he can have his last day of pulmonary rehab tomorrow and move on to start his cardiac rehab soon, please advise

## 2012-10-01 NOTE — Telephone Encounter (Signed)
Did not address if there was significant weight gain. Steroids can cause some of the swelling.Get some labs, Pro-BNP, BMET. Can increase lasix to extra 20 mg a day for two days only. He will need to have appt with [facility] for follow up. Needs to weigh himself daily and record.

## 2012-10-01 NOTE — Telephone Encounter (Signed)
Spoke to pt to advise results/instructions. Pt understood.orders placed in chart for lab draw, pt will increase lasix for 2 days ONLY Pt advised next time DR PN is in this office is 10-22-12, pt advised he will be willing to follow up with DR PN in [place] office sooner per 10-22-12 will not work for him and will feel more comfortable if evaluated sooner, please advise

## 2012-10-02 ENCOUNTER — Encounter (HOSPITAL_COMMUNITY)
Admission: RE | Admit: 2012-10-02 | Discharge: 2012-10-02 | Disposition: A | Discharge status: Home / Self Care | Payer: Managed Care, Other (non HMO) | Source: Ambulatory Visit | Attending: Emergency Medicine | Admitting: Emergency Medicine

## 2012-10-02 DIAGNOSIS — J449 Chronic obstructive pulmonary disease, unspecified: Secondary | ICD-10-CM | POA: Insufficient documentation

## 2012-10-02 DIAGNOSIS — Z5189 Encounter for other specified aftercare: Secondary | ICD-10-CM | POA: Insufficient documentation

## 2012-10-02 DIAGNOSIS — J4489 Other specified chronic obstructive pulmonary disease: Secondary | ICD-10-CM | POA: Insufficient documentation

## 2012-10-02 LAB — BASIC METABOLIC PANEL
BUN: 15 mg/dL (ref 6–23)
Calcium: 9 mg/dL (ref 8.4–10.5)
Glucose, Bld: 94 mg/dL (ref 70–99)
Potassium: 4.4 mEq/L (ref 3.5–5.3)

## 2012-10-02 NOTE — Telephone Encounter (Signed)
Dr PN gave verbal instructions the pt can be scheduled for an earlier f/u apt in the [place], pt noted currently in Cardiac Rehab, advised pt of phone number to [place] office, pt also noted feeling better today, swelling has went down somewhat, pt understood all instructions

## 2012-10-03 ENCOUNTER — Telehealth: Payer: Self-pay | Admitting: Emergency Medicine

## 2012-10-03 ENCOUNTER — Other Ambulatory Visit: Payer: Self-pay | Admitting: *Deleted

## 2012-10-03 NOTE — Telephone Encounter (Signed)
Spoke with patient made patient aware of results as listed below per Dr. Delton Coombes Patient verbalized understanding and nothing further needed at this time

## 2012-10-03 NOTE — Telephone Encounter (Signed)
Please let him know that his CXR shows stable changes consistent with his known emphysema. No new worrisome findings. Thanks

## 2012-10-03 NOTE — Telephone Encounter (Signed)
ATC patient no answer [id number] 

## 2012-10-03 NOTE — Telephone Encounter (Signed)
Pt is requesting CXR results from 09/26/12. Please advise RB thanks

## 2012-10-03 NOTE — Telephone Encounter (Signed)
She is not to stop Brilinta. Should be on this for one year before consideration of stopping it. As teeth extraction are not an emergent issue, she will have to postpone this for a minimum of one year unless her dentist is comfortable with extraction while on Brilinta.

## 2012-10-09 ENCOUNTER — Ambulatory Visit: Payer: Managed Care, Other (non HMO) | Admitting: Adult Health

## 2012-10-14 ENCOUNTER — Ambulatory Visit (INDEPENDENT_AMBULATORY_CARE_PROVIDER_SITE_OTHER): Payer: Managed Care, Other (non HMO) | Admitting: Adult Health

## 2012-10-14 ENCOUNTER — Encounter: Payer: Self-pay | Admitting: Adult Health

## 2012-10-14 VITALS — BP 138/97 | HR 95 | Ht 67.0 in | Wt 163.2 lb

## 2012-10-14 DIAGNOSIS — I2510 Atherosclerotic heart disease of native coronary artery without angina pectoris: Secondary | ICD-10-CM

## 2012-10-14 DIAGNOSIS — I10 Essential (primary) hypertension: Secondary | ICD-10-CM

## 2012-10-14 DIAGNOSIS — J449 Chronic obstructive pulmonary disease, unspecified: Secondary | ICD-10-CM

## 2012-10-14 DIAGNOSIS — R0609 Other forms of dyspnea: Secondary | ICD-10-CM

## 2012-10-14 DIAGNOSIS — R0600 Dyspnea, unspecified: Secondary | ICD-10-CM

## 2012-10-14 DIAGNOSIS — K219 Gastro-esophageal reflux disease without esophagitis: Secondary | ICD-10-CM

## 2012-10-14 MED ORDER — NEBIVOLOL HCL 5 MG PO TABS: 10.0000 mg | ORAL_TABLET | Freq: Every day | ORAL | Status: DC

## 2012-10-14 MED ORDER — LOSARTAN POTASSIUM 100 MG PO TABS: 50.0000 mg | ORAL_TABLET | Freq: Every day | ORAL | Status: DC

## 2012-10-14 MED ORDER — FUROSEMIDE 40 MG PO TABS: 40.0000 mg | ORAL_TABLET | Freq: Every day | ORAL | Status: DC

## 2012-10-14 MED ORDER — POTASSIUM CHLORIDE ER 10 MEQ PO TBCR: 20.0000 meq | EXTENDED_RELEASE_TABLET | Freq: Every day | ORAL | Status: DC

## 2012-10-14 NOTE — Patient Instructions (Addendum)
Your physician recommends that you schedule a follow-up appointment in: 2 weeks  Your physician has requested that you have an echocardiogram. Echocardiography is a painless test that uses sound waves to create images of your heart. It provides your doctor with information about the size and shape of your heart and how well your heart's chambers and valves are working. This procedure takes approximately one hour. There are no restrictions for this procedure.  Your physician recommends that you return for lab work today. For BMET , Pro-BNP  Your physician has recommended you make the following change in your medication:  1. Increase Lasix to 40 mg Daily. 2. Increase Potassium to 20 mEq daily. But the next 2 days take 40 mEq a day then 20 mEq thereafter. 3. Losartan to 50 mg every day. 4. Increase Bystolic to 10 mg Daily.  Your physician recommends you have a BP check on Friday.

## 2012-10-14 NOTE — Assessment & Plan Note (Signed)
Patient has some dyspnea, and associated tachycardia. There is some evidence of mild fluid overload. Will increase his Lasix to 40 mg daily, and increase potassium supplement as well. The patient may benefit from a right and left heart catheterization for further evaluation of coronary anatomy and intracardiac pressures. Presently the patient will be treated medically, we will increase his diastolic to 10 mg daily for better heart rate control, decrease Cozaar to 50 mg a day to avoid hypotension. BMET, proBNP, CBC will be ordered. He will return to the office in 4 days for reevaluation of blood pressure and heart rate control. In the interim I will repeat his echocardiogram was in the house and having better images for better evaluation of his heart function. I recommend right and left heart catheterization.

## 2012-10-14 NOTE — Assessment & Plan Note (Signed)
Recently started on PPI, with some improvement in symptoms of chest pressure.

## 2012-10-14 NOTE — Progress Notes (Signed)
HPI: [name] is a 57 year old patient of Dr. Eden Emms we are following for ongoing assessment and management of CAD. He is followed by pulmonology for history of COPD (bullous). He is status post colectomy and bilateral pneumothoraces. Last visit on 05/23/2012 the patient by systolic was increased to 5 mg daily lisinopril was discontinued.   He comes today with increasing shortness of breath and fluid retention. He had increased on his Lasix to 40 mg for 3 days and decrease to 30 mg a day. During the increased dose of Lasix the patient began to breathe better. Since reducing down to 30 mg a day he has had worsening symptoms again. He comes today wearing O2 with his O2 sat is 73% on room air, O2 was increased to 4 L per nasal cannula while here in the office . He denies chest pain but he is complaining of generalized fatigue. He had an echocardiogram in March it was difficult to read and LV function was not commented upon.  Allergies  Allergen Reactions  . Lipitor (Atorvastatin) Other (See Comments)    Muscle spasms     Current Outpatient Prescriptions  Medication Sig Dispense Refill  . acetaminophen (TYLENOL) 325 MG tablet Take 650 mg by mouth every 6 (six) hours as needed.      Marland Kitchen ADVAIR DISKUS 250-50 MCG/DOSE AEPB INHALE ONE DOSE BY MOUTH TWICE DAILY  60 each  6  . aspirin EC 81 MG tablet Take 81 mg by mouth daily.      . furosemide (LASIX) 40 MG tablet Take 1 tablet (40 mg total) by mouth daily.  30 tablet  6  . ipratropium-albuterol (DUONEB) 0.5-2.5 (3) MG/3ML SOLN Take 3 mLs by nebulization 4 (four) times daily.  360 mL  11  . losartan (COZAAR) 100 MG tablet Take 0.5 tablets (50 mg total) by mouth daily.  30 tablet  6  . Misc. Devices (ACAPELLA) MISC 1 Device by Does not apply route as directed.  1 each  0  . Multiple Vitamins-Minerals (MENS MULTIVITAMIN PLUS) TABS Take by mouth daily.      . naproxen (NAPROSYN) 500 MG tablet       . nebivolol (BYSTOLIC) 5 MG tablet Take 2 tablets (10 mg  total) by mouth daily.  60 tablet  6  . nitroGLYCERIN (NITROSTAT) 0.4 MG SL tablet Place 1 tablet (0.4 mg total) under the tongue every 5 (five) minutes as needed for chest pain.  90 tablet  3  . omeprazole (PRILOSEC) 20 MG capsule Take 20 mg by mouth 2 (two) times daily.       . potassium chloride (K-DUR) 10 MEQ tablet Take 2 tablets (20 mEq total) by mouth daily.  30 tablet  6  . prasugrel (EFFIENT) 10 MG TABS Take 10 mg by mouth daily.      . predniSONE (DELTASONE) 5 MG tablet 5 mg as needed.       Marland Kitchen PROAIR HFA 108 (90 BASE) MCG/ACT inhaler INHALE TWO PUFFS BY MOUTH EVERY 4 HOURS AS NEEDED  9 each  6  . Respiratory Therapy Supplies (FLUTTER) DEVI 1 Device by Does not apply route 2 (two) times daily.  1 each  0  . rosuvastatin (CRESTOR) 10 MG tablet Take 10 mg by mouth daily.       No current facility-administered medications for this visit.    Past Medical History  Diagnosis Date  . GERD (gastroesophageal reflux disease)   . COPD (chronic obstructive pulmonary disease)   . Myocardial  infarction 05/02/12    Past Surgical History  Procedure Laterality Date  . Lung removal, partial Bilateral 04/26/2000  . Cardiac catheterization    . Coronary stent placement  05/06/12    ZOX:[id number] of systems complete and found to be negative unless listed above  PHYSICAL EXAM BP 138/97  Pulse 95  Ht 5\' 7"  (1.702 m)  Wt 163 lb 4 oz (74.05 kg)  BMI 25.56 kg/m2  General: Well developed, well nourished, in no acute distress Head: Eyes PERRLA, No xanthomas.   Normal cephalic and atramatic  Lungs: Clear bilaterally to auscultation and percussion. Heart: HRRR S1 S2, without MRG.  Pulses are 2+ & equal.            No carotid bruit. Positive JVD.  No abdominal bruits. No femoral bruits. Abdomen: Bowel sounds are positive, abdomen soft and non-tender without masses or                  Hernia's noted. Msk:  Back normal, normal gait. Normal strength and tone for age. Extremities: Positive clubbing,  cyanosis or edema.  DP +1 Neuro: Alert and oriented X 3. Psych:  Good affect, responds appropriately  EKG: Sinus tachycardia with premature atrial complexes. Left atrial enlargement was noted. Incomplete right bundle branch block with a rate of 101 beats per minute.  ASSESSMENT AND PLAN

## 2012-10-14 NOTE — Progress Notes (Deleted)
Name: Danny [name]    DOB: September 28, 1955  Age: 57 y.o.  MR#: [id number]       PCP:  Isabella Stalling, MD      Insurance: Payor: Monia Pouch / Plan: Derrek Gu / Product Type: *No Product type* /   CC:    Chief Complaint  Patient presents with  . Coronary Artery Disease  Pt noted to have 79% 02 sats with 2L via portable tank oxygen was administered 4L of 02 via office 02 tank until 02 stabilized at 93%. Titrated back to 3L 02. Pt current 02 level at 93%.  VS Filed Vitals:   10/14/12 1108  BP: 138/97  Pulse: 95  Height: 5\' 7"  (1.702 m)  Weight: 163 lb 4 oz (74.05 kg)    Weights Current Weight  10/14/12 163 lb 4 oz (74.05 kg)  09/26/12 168 lb 3.2 oz (76.295 kg)  09/18/12 170 lb (77.111 kg)    Blood Pressure  BP Readings from Last 3 Encounters:  10/14/12 138/97  09/26/12 150/100  09/18/12 136/94     Admit date:  (Not on file) Last encounter with RMR:  10/09/2012   Allergy Lipitor  Current Outpatient Prescriptions  Medication Sig Dispense Refill  . acetaminophen (TYLENOL) 325 MG tablet Take 650 mg by mouth every 6 (six) hours as needed.      Marland Kitchen ADVAIR DISKUS 250-50 MCG/DOSE AEPB INHALE ONE DOSE BY MOUTH TWICE DAILY  60 each  6  . aspirin EC 81 MG tablet Take 81 mg by mouth daily.      . furosemide (LASIX) 20 MG tablet Take 30 mg by mouth daily.       Marland Kitchen ipratropium-albuterol (DUONEB) 0.5-2.5 (3) MG/3ML SOLN Take 3 mLs by nebulization 4 (four) times daily.  360 mL  11  . losartan (COZAAR) 100 MG tablet Take 100 mg by mouth daily.      . Misc. Devices (ACAPELLA) MISC 1 Device by Does not apply route as directed.  1 each  0  . Multiple Vitamins-Minerals (MENS MULTIVITAMIN PLUS) TABS Take by mouth daily.      . naproxen (NAPROSYN) 500 MG tablet       . nebivolol (BYSTOLIC) 5 MG tablet Take 1 tablet (5 mg total) by mouth daily.  30 tablet  6  . nitroGLYCERIN (NITROSTAT) 0.4 MG SL tablet Place 1 tablet (0.4 mg total) under the tongue every 5 (five) minutes as needed for chest pain.  90  tablet  3  . omeprazole (PRILOSEC) 20 MG capsule Take 20 mg by mouth 2 (two) times daily.       . potassium chloride (K-DUR) 10 MEQ tablet       . prasugrel (EFFIENT) 10 MG TABS Take 10 mg by mouth daily.      . predniSONE (DELTASONE) 5 MG tablet 5 mg as needed.       Marland Kitchen PROAIR HFA 108 (90 BASE) MCG/ACT inhaler INHALE TWO PUFFS BY MOUTH EVERY 4 HOURS AS NEEDED  9 each  6  . Respiratory Therapy Supplies (FLUTTER) DEVI 1 Device by Does not apply route 2 (two) times daily.  1 each  0  . rosuvastatin (CRESTOR) 10 MG tablet Take 10 mg by mouth daily.       No current facility-administered medications for this visit.    Discontinued Meds:   There are no discontinued medications.  Patient Active Problem List   Diagnosis Date Noted  . CAD (coronary artery disease) 05/10/2012  . COPD 05/14/2007  . GERD  05/14/2007    LABS    Component Value Date/Time   NA 141 10/02/2012 1015   NA 141 01/08/2008 0709   K 4.4 10/02/2012 1015   K 4.0 01/08/2008 0709   CL 100 10/02/2012 1015   CL 107 01/08/2008 0709   CO2 34* 10/02/2012 1015   CO2 28 01/08/2008 0709   GLUCOSE 94 10/02/2012 1015   GLUCOSE 115* 01/08/2008 0709   BUN 15 10/02/2012 1015   BUN 17 01/08/2008 0709   CREATININE 1.31 10/02/2012 1015   CREATININE 1.38 01/08/2008 0709   CALCIUM 9.0 10/02/2012 1015   CALCIUM 9.0 01/08/2008 0709   GFRNONAA 55* 01/08/2008 0709   GFRAA  Value: >60        The eGFR has been calculated using the MDRD equation. This calculation has not been validated in all clinical 01/08/2008 0709   CMP     Component Value Date/Time   NA 141 10/02/2012 1015   K 4.4 10/02/2012 1015   CL 100 10/02/2012 1015   CO2 34* 10/02/2012 1015   GLUCOSE 94 10/02/2012 1015   BUN 15 10/02/2012 1015   CREATININE 1.31 10/02/2012 1015   CREATININE 1.38 01/08/2008 0709   CALCIUM 9.0 10/02/2012 1015   GFRNONAA 55* 01/08/2008 0709   GFRAA  Value: >60        The eGFR has been calculated using the MDRD equation. This calculation has not been validated in all clinical  01/08/2008 0709       Component Value Date/Time   WBC 11.4* 01/08/2008 0709   HGB 14.9 01/08/2008 0709   HCT 44.2 01/08/2008 0709   MCV 96.6 01/08/2008 0709    Lipid Panel  No results found for this basename: chol, trig, hdl, cholhdl, vldl, ldlcalc    ABG No results found for this basename: phart, pco2, pco2art, po2, po2art, hco3, tco2, acidbasedef, o2sat     No results found for this basename: TSH   BNP (last 3 results)  Recent Labs  10/02/12 1015  PROBNP 2474.00*   Cardiac Panel (last 3 results) No results found for this basename: CKTOTAL, CKMB, TROPONINI, RELINDX,  in the last 72 hours  Iron/TIBC/Ferritin No results found for this basename: iron, tibc, ferritin     EKG Orders placed in visit on 10/14/12  . EKG 12-LEAD     Prior Assessment and Plan Problem List as of 10/14/2012     Cardiovascular and Mediastinum   CAD (coronary artery disease)   Last Assessment & Plan   09/18/2012 Office Visit Written 09/18/2012 10:02 AM by Wendall Stade, MD     Stable with no angina and good activity level.  Continue medical Rx       Respiratory   COPD   Last Assessment & Plan   09/26/2012 Office Visit Written 09/26/2012  2:52 PM by Leslye Peer, MD     Same BD's Reviewed the risks of using pred prn Flutter valve at his request      Digestive   GERD   Last Assessment & Plan   09/26/2012 Office Visit Written 09/26/2012  2:52 PM by Leslye Peer, MD     I think this is the CP and fullness he is experiencing. Would like to treat this, get him off the prednisone. He is using pred prn, ironically may be making GERD worse        Imaging: Dg Chest 2 View  09/26/2012   *RADIOLOGY REPORT*  Clinical Data: COPD.  Shortness of breath.  Emphysema.  CHEST - 2 VIEW  Comparison: Chest x-ray dated 01/08/2008  Findings: There are no acute infiltrates or effusions.  There is extensive scarring in both lungs with postsurgical changes bilaterally.  There is slight cardiomegaly.  Pulmonary  vascularity is stable and within normal limits.  No acute osseous abnormality.  Hyperlucency in the left upper lobe consistent with emphysema.  IMPRESSION: No acute abnormality.  Emphysema with scarring and postsurgical changes bilaterally.   Original Report Authenticated By: Francene Boyers, M.D.

## 2012-10-14 NOTE — Assessment & Plan Note (Signed)
Patient remains on oxygen, is dependent on anywhere from 2-4 L depending upon activity. He is recently finished pulmonary rehabilitation, and has had increased doses of his inhaler per primary pulmonologist.

## 2012-10-15 LAB — BASIC METABOLIC PANEL
Calcium: 9.4 mg/dL (ref 8.4–10.5)
Glucose, Bld: 67 mg/dL — ABNORMAL LOW (ref 70–99)
Sodium: 141 mEq/L (ref 135–145)

## 2012-10-17 ENCOUNTER — Encounter: Payer: Self-pay | Admitting: *Deleted

## 2012-10-18 ENCOUNTER — Encounter (HOSPITAL_COMMUNITY): Payer: Self-pay

## 2012-10-18 ENCOUNTER — Other Ambulatory Visit: Payer: Self-pay | Admitting: *Deleted

## 2012-10-18 ENCOUNTER — Encounter (HOSPITAL_COMMUNITY)
Admission: RE | Admit: 2012-10-18 | Discharge: 2012-10-18 | Disposition: A | Discharge status: Home / Self Care | Payer: Managed Care, Other (non HMO) | Source: Ambulatory Visit | Attending: Adult Health | Admitting: Adult Health

## 2012-10-18 ENCOUNTER — Ambulatory Visit (HOSPITAL_COMMUNITY)
Admission: RE | Admit: 2012-10-18 | Discharge: 2012-10-18 | Disposition: A | Discharge status: Home / Self Care | Payer: Managed Care, Other (non HMO) | Source: Ambulatory Visit | Attending: Adult Health | Admitting: Adult Health

## 2012-10-18 ENCOUNTER — Ambulatory Visit (INDEPENDENT_AMBULATORY_CARE_PROVIDER_SITE_OTHER): Payer: Managed Care, Other (non HMO) | Admitting: Adult Health

## 2012-10-18 ENCOUNTER — Ambulatory Visit (INDEPENDENT_AMBULATORY_CARE_PROVIDER_SITE_OTHER): Payer: Managed Care, Other (non HMO) | Admitting: *Deleted

## 2012-10-18 ENCOUNTER — Encounter: Payer: Self-pay | Admitting: Adult Health

## 2012-10-18 ENCOUNTER — Other Ambulatory Visit: Payer: Self-pay | Admitting: Adult Health

## 2012-10-18 VITALS — BP 142/98 | HR 89 | Ht 67.0 in | Wt 163.2 lb

## 2012-10-18 VITALS — BP 122/80 | HR 89 | Ht 67.0 in | Wt 163.0 lb

## 2012-10-18 DIAGNOSIS — I509 Heart failure, unspecified: Secondary | ICD-10-CM | POA: Insufficient documentation

## 2012-10-18 DIAGNOSIS — I252 Old myocardial infarction: Secondary | ICD-10-CM | POA: Insufficient documentation

## 2012-10-18 DIAGNOSIS — I219 Acute myocardial infarction, unspecified: Secondary | ICD-10-CM

## 2012-10-18 DIAGNOSIS — J449 Chronic obstructive pulmonary disease, unspecified: Secondary | ICD-10-CM

## 2012-10-18 DIAGNOSIS — R0602 Shortness of breath: Secondary | ICD-10-CM | POA: Insufficient documentation

## 2012-10-18 DIAGNOSIS — I513 Intracardiac thrombosis, not elsewhere classified: Secondary | ICD-10-CM | POA: Insufficient documentation

## 2012-10-18 DIAGNOSIS — R0609 Other forms of dyspnea: Secondary | ICD-10-CM | POA: Insufficient documentation

## 2012-10-18 DIAGNOSIS — J4489 Other specified chronic obstructive pulmonary disease: Secondary | ICD-10-CM | POA: Insufficient documentation

## 2012-10-18 DIAGNOSIS — R0600 Dyspnea, unspecified: Secondary | ICD-10-CM

## 2012-10-18 DIAGNOSIS — I10 Essential (primary) hypertension: Secondary | ICD-10-CM | POA: Insufficient documentation

## 2012-10-18 DIAGNOSIS — Z9889 Other specified postprocedural states: Secondary | ICD-10-CM | POA: Insufficient documentation

## 2012-10-18 DIAGNOSIS — R0989 Other specified symptoms and signs involving the circulatory and respiratory systems: Secondary | ICD-10-CM | POA: Insufficient documentation

## 2012-10-18 DIAGNOSIS — I5189 Other ill-defined heart diseases: Secondary | ICD-10-CM | POA: Insufficient documentation

## 2012-10-18 DIAGNOSIS — I2510 Atherosclerotic heart disease of native coronary artery without angina pectoris: Secondary | ICD-10-CM

## 2012-10-18 MED ORDER — POTASSIUM CHLORIDE ER 10 MEQ PO TBCR: 20.0000 meq | EXTENDED_RELEASE_TABLET | Freq: Every day | ORAL | Status: DC

## 2012-10-18 MED ORDER — TECHNETIUM TO 99M ALBUMIN AGGREGATED
6.0000 | Freq: Once | INTRAVENOUS | Status: AC | PRN
  Administered 2012-10-18: 6 via INTRAVENOUS

## 2012-10-18 MED ORDER — CARVEDILOL 6.25 MG PO TABS: 6.2500 mg | ORAL_TABLET | Freq: Two times a day (BID) | ORAL | Status: DC

## 2012-10-18 MED ORDER — TECHNETIUM TC 99M DIETHYLENETRIAME-PENTAACETIC ACID
40.0000 | Freq: Once | INTRAVENOUS | Status: AC | PRN
  Administered 2012-10-18: 40 via RESPIRATORY_TRACT

## 2012-10-18 NOTE — Assessment & Plan Note (Signed)
He is asymptomatic concerning chest pain. It is noted per echocardiogram that his LV function was 40-45%. The patient was found to have abnormal left and trigger relaxation with grade 1 diastolic dysfunction. Right ventricular systolic function was severely reduced. I have discontinued by Bystolic and we will start him on Coreg 6.25 mg twice a day. We will continue Cozaar. Lasix. Effient. Dr.Koneswarin has reviewed this case, discussed medications, and followup with me. He will be followed in one month by Dr. Diona Browner, as the patient requests to be seen by him now as a result of Dr. Dietrich Pates retirement.

## 2012-10-18 NOTE — Assessment & Plan Note (Signed)
The patient has been symptomatic for shortness of breath, which is chronic for him but he states it has worsened over the last several weeks. Echocardiogram demonstrated right ventricular thrombus, he was started on Coumadin 10 mg daily and will be seen in our Coumadin clinic in 2 days for evaluation of INR and dosing. He will have a repeat echocardiogram in 3 months for reevaluation. I explained to him being on Coumadin, the importance of taking it daily, and reasons for use. Verbalizes understanding.

## 2012-10-18 NOTE — Progress Notes (Signed)
*  PRELIMINARY RESULTS* Echocardiogram 2D Echocardiogram has been performed.  Danny [name] 10/18/2012, 10:55 AM

## 2012-10-18 NOTE — Progress Notes (Deleted)
Name: Danny [name]    DOB: [birth year]  Age: 57 y.o.  MR#: [id number]       PCP:  Isabella Stalling, MD      Insurance: Payor: Monia Pouch / Plan: Derrek Gu / Product Type: *No Product type* /   CC:    Chief Complaint  Patient presents with  . Shortness of Breath    VS Filed Vitals:   10/18/12 1519  BP: 122/80  Pulse: 89  Height: 5\' 7"  (1.702 m)  Weight: 163 lb (73.936 kg)  SpO2: 89%    Weights Current Weight  10/18/12 163 lb (73.936 kg)  10/18/12 163 lb 4 oz (74.05 kg)  10/14/12 163 lb 4 oz (74.05 kg)    Blood Pressure  BP Readings from Last 3 Encounters:  10/18/12 122/80  10/18/12 142/98  10/14/12 138/97     Admit date:  (Not on file) Last encounter with RMR:  10/14/2012   Allergy Lipitor  Current Outpatient Prescriptions  Medication Sig Dispense Refill  . acetaminophen (TYLENOL) 325 MG tablet Take 650 mg by mouth every 6 (six) hours as needed.      Marland Kitchen ADVAIR DISKUS 250-50 MCG/DOSE AEPB INHALE ONE DOSE BY MOUTH TWICE DAILY  60 each  6  . aspirin EC 81 MG tablet Take 81 mg by mouth daily.      . furosemide (LASIX) 40 MG tablet Take 1 tablet (40 mg total) by mouth daily.  30 tablet  6  . ipratropium-albuterol (DUONEB) 0.5-2.5 (3) MG/3ML SOLN Take 3 mLs by nebulization 4 (four) times daily.  360 mL  11  . losartan (COZAAR) 100 MG tablet Take 0.5 tablets (50 mg total) by mouth daily.  30 tablet  6  . Misc. Devices (ACAPELLA) MISC 1 Device by Does not apply route as directed.  1 each  0  . Multiple Vitamins-Minerals (MENS MULTIVITAMIN PLUS) TABS Take by mouth daily.      . naproxen (NAPROSYN) 500 MG tablet       . nebivolol (BYSTOLIC) 5 MG tablet Take 2 tablets (10 mg total) by mouth daily.  60 tablet  6  . nitroGLYCERIN (NITROSTAT) 0.4 MG SL tablet Place 1 tablet (0.4 mg total) under the tongue every 5 (five) minutes as needed for chest pain.  90 tablet  3  . omeprazole (PRILOSEC) 20 MG capsule Take 20 mg by mouth 2 (two) times daily.       . potassium chloride (K-DUR)  10 MEQ tablet Take 2 tablets (20 mEq total) by mouth daily.  60 tablet  6  . prasugrel (EFFIENT) 10 MG TABS Take 10 mg by mouth daily.      . predniSONE (DELTASONE) 5 MG tablet 5 mg as needed.       Marland Kitchen PROAIR HFA 108 (90 BASE) MCG/ACT inhaler INHALE TWO PUFFS BY MOUTH EVERY 4 HOURS AS NEEDED  9 each  6  . Respiratory Therapy Supplies (FLUTTER) DEVI 1 Device by Does not apply route 2 (two) times daily.  1 each  0  . rosuvastatin (CRESTOR) 10 MG tablet Take 10 mg by mouth daily.      Marland Kitchen warfarin (COUMADIN) 10 MG tablet Take 10 mg by mouth as directed.       No current facility-administered medications for this visit.    Discontinued Meds:    Medications Discontinued During This Encounter  Medication Reason  . potassium chloride (K-DUR) 10 MEQ tablet Reorder    Patient Active Problem List   Diagnosis Date Noted  .  CAD (coronary artery disease) 05/10/2012  . COPD 05/14/2007  . GERD 05/14/2007    LABS    Component Value Date/Time   NA 141 10/14/2012 1240   NA 141 10/02/2012 1015   NA 141 01/08/2008 0709   K 4.4 10/14/2012 1240   K 4.4 10/02/2012 1015   K 4.0 01/08/2008 0709   CL 100 10/14/2012 1240   CL 100 10/02/2012 1015   CL 107 01/08/2008 0709   CO2 34* 10/14/2012 1240   CO2 34* 10/02/2012 1015   CO2 28 01/08/2008 0709   GLUCOSE 67* 10/14/2012 1240   GLUCOSE 94 10/02/2012 1015   GLUCOSE 115* 01/08/2008 0709   BUN 18 10/14/2012 1240   BUN 15 10/02/2012 1015   BUN 17 01/08/2008 0709   CREATININE 1.35 10/14/2012 1240   CREATININE 1.31 10/02/2012 1015   CREATININE 1.38 01/08/2008 0709   CALCIUM 9.4 10/14/2012 1240   CALCIUM 9.0 10/02/2012 1015   CALCIUM 9.0 01/08/2008 0709   GFRNONAA 55* 01/08/2008 0709   GFRAA  Value: >60        The eGFR has been calculated using the MDRD equation. This calculation has not been validated in all clinical 01/08/2008 0709   CMP     Component Value Date/Time   NA 141 10/14/2012 1240   K 4.4 10/14/2012 1240   CL 100 10/14/2012 1240   CO2 34* 10/14/2012 1240   GLUCOSE  67* 10/14/2012 1240   BUN 18 10/14/2012 1240   CREATININE 1.35 10/14/2012 1240   CREATININE 1.38 01/08/2008 0709   CALCIUM 9.4 10/14/2012 1240   GFRNONAA 55* 01/08/2008 0709   GFRAA  Value: >60        The eGFR has been calculated using the MDRD equation. This calculation has not been validated in all clinical 01/08/2008 0709       Component Value Date/Time   WBC 11.4* 01/08/2008 0709   HGB 14.9 01/08/2008 0709   HCT 44.2 01/08/2008 0709   MCV 96.6 01/08/2008 0709    Lipid Panel  No results found for this basename: chol, trig, hdl, cholhdl, vldl, ldlcalc    ABG No results found for this basename: phart, pco2, pco2art, po2, po2art, hco3, tco2, acidbasedef, o2sat     No results found for this basename: TSH   BNP (last 3 results)  Recent Labs  10/02/12 1015  PROBNP 2474.00*   Cardiac Panel (last 3 results) No results found for this basename: CKTOTAL, CKMB, TROPONINI, RELINDX,  in the last 72 hours  Iron/TIBC/Ferritin No results found for this basename: iron, tibc, ferritin     EKG Orders placed in visit on 10/14/12  . EKG 12-LEAD     Prior Assessment and Plan Problem List as of 10/18/2012     Cardiovascular and Mediastinum   CAD (coronary artery disease)   Last Assessment & Plan   10/14/2012 Office Visit Written 10/14/2012  1:12 PM by Jodelle Gross, NP     Patient has some dyspnea, and associated tachycardia. There is some evidence of mild fluid overload. Will increase his Lasix to 40 mg daily, and increase potassium supplement as well. The patient may benefit from a right and left heart catheterization for further evaluation of coronary anatomy and intracardiac pressures. Presently the patient will be treated medically, we will increase his diastolic to 10 mg daily for better heart rate control, decrease Cozaar to 50 mg a day to avoid hypotension. BMET, proBNP, CBC will be ordered. He will return to the office in 4  days for reevaluation of blood pressure and heart rate control.  In the interim I will repeat his echocardiogram was in the house and having better images for better evaluation of his heart function. I recommend right and left heart catheterization.      Respiratory   COPD   Last Assessment & Plan   10/14/2012 Office Visit Written 10/14/2012  1:12 PM by Jodelle Gross, NP     Patient remains on oxygen, is dependent on anywhere from 2-4 L depending upon activity. He is recently finished pulmonary rehabilitation, and has had increased doses of his inhaler per primary pulmonologist.      Digestive   GERD   Last Assessment & Plan   10/14/2012 Office Visit Written 10/14/2012  1:12 PM by Jodelle Gross, NP     Recently started on PPI, with some improvement in symptoms of chest pressure.        Imaging: Dg Chest 2 View  10/18/2012   *RADIOLOGY REPORT*  Clinical Data: Shortness of breath, right ventricular thrombus, history COPD, coronary artery disease post stenting, lung resection  CHEST - 2 VIEW  Comparison: 09/26/2012  Findings: Enlargement of cardiac silhouette. Atherosclerotic calcification of a mildly elongated thoracic aorta. Mediastinal contours and pulmonary vascularity normal. Emphysematous and postsurgical changes / scarring unchanged. No acute infiltrate, pleural effusion or pneumothorax. Old fractures of multiple posterior right ribs. No acute process identified.  IMPRESSION: Emphysematous and postsurgical changes/scarring in the lungs as above. Mild enlargement of cardiac silhouette. No acute abnormalities.   Original Report Authenticated By: Ulyses Southward, M.D.   Dg Chest 2 View  09/26/2012   *RADIOLOGY REPORT*  Clinical Data: COPD.  Shortness of breath.  Emphysema.  CHEST - 2 VIEW  Comparison: Chest x-ray dated 01/08/2008  Findings: There are no acute infiltrates or effusions.  There is extensive scarring in both lungs with postsurgical changes bilaterally.  There is slight cardiomegaly.  Pulmonary vascularity is stable and within normal limits.   No acute osseous abnormality.  Hyperlucency in the left upper lobe consistent with emphysema.  IMPRESSION: No acute abnormality.  Emphysema with scarring and postsurgical changes bilaterally.   Original Report Authenticated By: Francene Boyers, M.D.   Nm Pulmonary Perf And Vent  10/18/2012   *RADIOLOGY REPORT*  Clinical Data:  Shortness of breath, right ventricular thrombus, history hypertension, CHF, MI, COPD, prior lung surgery  NUCLEAR MEDICINE VENTILATION - PERFUSION LUNG SCAN  Technique:  Ventilation images were obtained in multiple projections using inhaled aerosol technetium 99 M DTPA.  Perfusion images were obtained in multiple projections after intravenous injection of Tc-51m MAA.  Radiopharmaceuticals:  Tc-46m DTPA aerosol and 6 mCi Tc-85m MAA.  Comparison: None Correlation:  Chest radiograph 10/18/2012  Findings:  Ventilation:  Abnormal ventilation demonstrating patchy subsegmental areas of abnormal ventilation throughout both lungs greater on left.  Swallowed tracer within stomach.  Perfusion:  Elevation left diaphragm.  Small subsegmental perfusion defects at the right middle lobe and lingula, matching ventilatory abnormalities. Perfusion defect left apex, matching a ventilation defect. Nonsegmental diminished perfusion laterally in left upper lobe.  Overall better perfusion than the ventilation is seen throughout both lungs.  Chest radiograph:  COPD changes with postsurgical changes in both lungs and scattered areas of scarring.  IMPRESSION: Worse ventilation than perfusion throughout both lungs with subsegmental ventilatory and fewer perfusion defects as discussed above. Findings represent a low probability for pulmonary embolism.  Findings called to [name] on 10/18/2012 at 1525 hours.   Original Report Authenticated By:  Ulyses Southward, M.D.

## 2012-10-18 NOTE — Progress Notes (Signed)
Pt presented to nurse visit today PER RECENT MEDICATION CHANGES .Pt voiced no complaints today, PT GOING TO ECHO APT IN A FEW MINUTES Please advise if any further instructions are neccessary   Cala Bradford A. Gregorio Worley L.P.N.  Last OV D/C Summary and VS:  BP 138/97  Pulse 95  Ht 5\' 7"  (1.702 m)  Wt 163 lb 4 oz (74.05 kg)  BMI 25.56 kg/m2  Your physician recommends that you schedule a follow-up appointment in: 2 weeks  Your physician has requested that you have an echocardiogram. Echocardiography is a painless test that uses sound waves to create images of your heart. It provides your doctor with information about the size and shape of your heart and how well your heart's chambers and valves are working. This procedure takes approximately one hour. There are no restrictions for this procedure.  Your physician recommends that you return for lab work today. For BMET , Pro-BNP  Your physician has recommended you make the following change in your medication:  1. Increase Lasix to 40 mg Daily.  2. Increase Potassium to 20 mEq daily. But the next 2 days take 40 mEq a day then 20 mEq thereafter.  3. Losartan to 50 mg every day.  4. Increase Bystolic to 10 mg Daily.  Your physician recommends you have a BP check on Friday.

## 2012-10-18 NOTE — Assessment & Plan Note (Signed)
He remains oxygen dependent. No changes at this time. He continues to be followed by pulmonary, Dr. Sherene Sires.

## 2012-10-18 NOTE — Patient Instructions (Addendum)
Your physician recommends that you schedule a follow-up appointment in: TO BE DETERMINED   

## 2012-10-18 NOTE — Patient Instructions (Addendum)
Your physician recommends that you schedule a follow-up appointment in: 1 MONTH WITH DR MCDOWELL AND FOLLOW UP WITH COUMADIN CLINIC Monday.  Your physician recommends that you return for lab work in: BMET IN 2 WEEKS  .  Your physician has recommended you make the following change in your medication: STOP BYSTOLIC AND START COREG 6.25MG  BID

## 2012-10-18 NOTE — Progress Notes (Signed)
HPI: [name] is a 57 year old patient of Dr. Eden Emms, who will be transferring to the care of Dr. Diona Browner, or seen today on followup after having had an abnormal echocardiogram. The patient was sent to the echocardiogram in the setting of worsening dyspnea, lower chest and the edema. He has history of CAD with stent placement in February 2014, on Effient and aspirin. I last visit he was having some chest pressure dyspnea and lower extremity edema. I increased his Lasix to 40 mg daily and repeat his echocardiogram to evaluate LV function with known history of CAD and COPD. Patient has bullous emphysema and has been seen by Dr. Barry Dienes status post bilateral bullectomy n 2002.   Today will and at the left, an abnormal finding was seen by echo sonographer.  Echocardiogram was read by Dr.Koneswarin before patient left the lab which demonstrated a right ventricular apical thrombus:    Left ventricle: There was moderate concentric hypertrophy. Systolic function was mildly to moderately reduced. The estimated ejection fraction was in the range of 40% to 45%. There was an increased relative contribution of atrial contraction to ventricular filling. Doppler parameters are consistent with abnormal left ventricular relaxation (grade 1 diastolic dysfunction). Doppler parameters are consistent with high ventricular filling pressure. - Ventricular septum: The contour showed diastolic flattening and systolic flattening, indicative of right ventricular volume and pressure overload. - Right ventricle: Right ventricular systolic function is severely reduced. There is a homogeneous mass in the right ventricular apex, with a high suspicion for thrombus.  The results were communicated with [name], NP. The cavity size was severely dilated. - Right atrium: The atrium was moderately to severely Dilated.  The patient subsequently placed on Coumadin 10 mg daily, sent for VQ lung scan for evaluation of PE.  He remains on oxygen via nasal cannula. VQ lung scan was negative for PE. Ventilation was worse than perfusion     Allergies  Allergen Reactions  . Lipitor (Atorvastatin) Other (See Comments)    Muscle spasms     Current Outpatient Prescriptions  Medication Sig Dispense Refill  . acetaminophen (TYLENOL) 325 MG tablet Take 650 mg by mouth every 6 (six) hours as needed.      Marland Kitchen ADVAIR DISKUS 250-50 MCG/DOSE AEPB INHALE ONE DOSE BY MOUTH TWICE DAILY  60 each  6  . aspirin EC 81 MG tablet Take 81 mg by mouth daily.      . furosemide (LASIX) 40 MG tablet Take 1 tablet (40 mg total) by mouth daily.  30 tablet  6  . ipratropium-albuterol (DUONEB) 0.5-2.5 (3) MG/3ML SOLN Take 3 mLs by nebulization 4 (four) times daily.  360 mL  11  . losartan (COZAAR) 100 MG tablet Take 0.5 tablets (50 mg total) by mouth daily.  30 tablet  6  . Misc. Devices (ACAPELLA) MISC 1 Device by Does not apply route as directed.  1 each  0  . Multiple Vitamins-Minerals (MENS MULTIVITAMIN PLUS) TABS Take by mouth daily.      . naproxen (NAPROSYN) 500 MG tablet       . nitroGLYCERIN (NITROSTAT) 0.4 MG SL tablet Place 1 tablet (0.4 mg total) under the tongue every 5 (five) minutes as needed for chest pain.  90 tablet  3  . omeprazole (PRILOSEC) 20 MG capsule Take 20 mg by mouth 2 (two) times daily.       . potassium chloride (K-DUR) 10 MEQ tablet Take 2 tablets (20 mEq total) by mouth daily.  60 tablet  6  . prasugrel (EFFIENT) 10 MG TABS Take 10 mg by mouth daily.      . predniSONE (DELTASONE) 5 MG tablet 5 mg as needed.       Marland Kitchen PROAIR HFA 108 (90 BASE) MCG/ACT inhaler INHALE TWO PUFFS BY MOUTH EVERY 4 HOURS AS NEEDED  9 each  6  . Respiratory Therapy Supplies (FLUTTER) DEVI 1 Device by Does not apply route 2 (two) times daily.  1 each  0  . rosuvastatin (CRESTOR) 10 MG tablet Take 10 mg by mouth daily.      Marland Kitchen warfarin (COUMADIN) 10 MG tablet Take 10 mg by mouth as directed.      . carvedilol (COREG) 6.25 MG tablet Take 1  tablet (6.25 mg total) by mouth 2 (two) times daily.  180 tablet  3   No current facility-administered medications for this visit.    Past Medical History  Diagnosis Date  . GERD (gastroesophageal reflux disease)   . COPD (chronic obstructive pulmonary disease)   . Myocardial infarction 05/02/12  . CHF (congestive heart failure)   . Hypertension     Past Surgical History  Procedure Laterality Date  . Lung removal, partial Bilateral 04/26/2000  . Cardiac catheterization    . Coronary stent placement  05/06/12    ROS: Review of systems complete and found to be negative unless listed above   PHYSICAL EXAM BP 122/80  Pulse 89  Ht 5\' 7"  (1.702 m)  Wt 163 lb (73.936 kg)  BMI 25.52 kg/m2  SpO2 89% General: Well developed, well nourished, in no acute distress wearing oxygen. Head: Eyes PERRLA, No xanthomas.   Normal cephalic and atramatic  Lungs: Clear bilaterally diminished breath sounds are noted bibasilar.  Heart: HRRR S1 S2, without MRG.  Pulses are 2+ & equal.            No carotid bruit. Abdomen: Bowel sounds are positive, abdomen soft and non-tender without masses or                  Hernia's noted. Msk:  Back normal, normal gait. Normal strength and tone for age. Extremities: No clubbing, cyanosis or edema.  DP +1 Neuro: Alert and oriented X 3. Psych:  Good affect, responds appropriately\   ASSESSMENT AND PLAN

## 2012-10-18 NOTE — Progress Notes (Signed)
Ok, thanks.

## 2012-10-21 ENCOUNTER — Ambulatory Visit (INDEPENDENT_AMBULATORY_CARE_PROVIDER_SITE_OTHER): Payer: Managed Care, Other (non HMO) | Admitting: *Deleted

## 2012-10-21 DIAGNOSIS — I219 Acute myocardial infarction, unspecified: Secondary | ICD-10-CM

## 2012-10-21 DIAGNOSIS — Z7901 Long term (current) use of anticoagulants: Secondary | ICD-10-CM

## 2012-10-21 DIAGNOSIS — I513 Intracardiac thrombosis, not elsewhere classified: Secondary | ICD-10-CM

## 2012-10-21 LAB — POCT INR: INR: 2.8

## 2012-10-21 MED ORDER — WARFARIN SODIUM 2.5 MG PO TABS: ORAL_TABLET | ORAL | Status: DC

## 2012-10-21 NOTE — Patient Instructions (Signed)

## 2012-10-23 ENCOUNTER — Telehealth: Payer: Self-pay | Admitting: *Deleted

## 2012-10-23 ENCOUNTER — Emergency Department (HOSPITAL_COMMUNITY): Payer: Managed Care, Other (non HMO)

## 2012-10-23 ENCOUNTER — Inpatient Hospital Stay (HOSPITAL_COMMUNITY)
Admission: EM | Admit: 2012-10-23 | Discharge: 2012-11-01 | DRG: 193 | Disposition: A | Discharge status: Home / Self Care | Payer: Managed Care, Other (non HMO) | Attending: Family Medicine | Admitting: Family Medicine

## 2012-10-23 ENCOUNTER — Encounter (HOSPITAL_COMMUNITY): Payer: Self-pay | Admitting: *Deleted

## 2012-10-23 ENCOUNTER — Other Ambulatory Visit: Payer: Self-pay

## 2012-10-23 DIAGNOSIS — I219 Acute myocardial infarction, unspecified: Secondary | ICD-10-CM

## 2012-10-23 DIAGNOSIS — J449 Chronic obstructive pulmonary disease, unspecified: Secondary | ICD-10-CM | POA: Diagnosis present

## 2012-10-23 DIAGNOSIS — I2510 Atherosclerotic heart disease of native coronary artery without angina pectoris: Secondary | ICD-10-CM | POA: Diagnosis present

## 2012-10-23 DIAGNOSIS — I2589 Other forms of chronic ischemic heart disease: Secondary | ICD-10-CM | POA: Diagnosis present

## 2012-10-23 DIAGNOSIS — I513 Intracardiac thrombosis, not elsewhere classified: Secondary | ICD-10-CM | POA: Diagnosis present

## 2012-10-23 DIAGNOSIS — R079 Chest pain, unspecified: Secondary | ICD-10-CM

## 2012-10-23 DIAGNOSIS — E785 Hyperlipidemia, unspecified: Secondary | ICD-10-CM | POA: Diagnosis present

## 2012-10-23 DIAGNOSIS — I252 Old myocardial infarction: Secondary | ICD-10-CM

## 2012-10-23 DIAGNOSIS — Z7901 Long term (current) use of anticoagulants: Secondary | ICD-10-CM

## 2012-10-23 DIAGNOSIS — I10 Essential (primary) hypertension: Secondary | ICD-10-CM

## 2012-10-23 DIAGNOSIS — J189 Pneumonia, unspecified organism: Principal | ICD-10-CM | POA: Diagnosis present

## 2012-10-23 DIAGNOSIS — K219 Gastro-esophageal reflux disease without esophagitis: Secondary | ICD-10-CM

## 2012-10-23 DIAGNOSIS — Z79899 Other long term (current) drug therapy: Secondary | ICD-10-CM

## 2012-10-23 DIAGNOSIS — I5189 Other ill-defined heart diseases: Secondary | ICD-10-CM | POA: Diagnosis present

## 2012-10-23 DIAGNOSIS — J441 Chronic obstructive pulmonary disease with (acute) exacerbation: Secondary | ICD-10-CM | POA: Diagnosis present

## 2012-10-23 DIAGNOSIS — J9620 Acute and chronic respiratory failure, unspecified whether with hypoxia or hypercapnia: Secondary | ICD-10-CM | POA: Diagnosis present

## 2012-10-23 DIAGNOSIS — Z9981 Dependence on supplemental oxygen: Secondary | ICD-10-CM

## 2012-10-23 DIAGNOSIS — R0602 Shortness of breath: Secondary | ICD-10-CM

## 2012-10-23 DIAGNOSIS — J4489 Other specified chronic obstructive pulmonary disease: Secondary | ICD-10-CM | POA: Diagnosis present

## 2012-10-23 LAB — PRO B NATRIURETIC PEPTIDE: Pro B Natriuretic peptide (BNP): 2088 pg/mL — ABNORMAL HIGH (ref 0–125)

## 2012-10-23 LAB — PROTIME-INR
INR: 1.75 — ABNORMAL HIGH (ref 0.00–1.49)
Prothrombin Time: 19.9 seconds — ABNORMAL HIGH (ref 11.6–15.2)

## 2012-10-23 LAB — CBC WITH DIFFERENTIAL/PLATELET
Basophils Absolute: 0 10*3/uL (ref 0.0–0.1)
Basophils Relative: 1 % (ref 0–1)
Eosinophils Absolute: 0.1 10*3/uL (ref 0.0–0.7)
Lymphs Abs: 1.9 10*3/uL (ref 0.7–4.0)
MCH: 33.3 pg (ref 26.0–34.0)
Neutrophils Relative %: 55 % (ref 43–77)
Platelets: 190 10*3/uL (ref 150–400)
RBC: 4.66 MIL/uL (ref 4.22–5.81)
RDW: 13.1 % (ref 11.5–15.5)

## 2012-10-23 LAB — BASIC METABOLIC PANEL
GFR calc Af Amer: 79 mL/min — ABNORMAL LOW (ref >90)
GFR calc non Af Amer: 68 mL/min — ABNORMAL LOW (ref >90)
Potassium: 3.7 mEq/L (ref 3.5–5.1)
Sodium: 140 mEq/L (ref 135–145)

## 2012-10-23 MED ORDER — ALBUTEROL SULFATE HFA 108 (90 BASE) MCG/ACT IN AERS: 2.0000 | INHALATION_SPRAY | Freq: Four times a day (QID) | RESPIRATORY_TRACT | Status: DC | PRN

## 2012-10-23 MED ORDER — ACETAMINOPHEN 325 MG PO TABS
650.0000 mg | ORAL_TABLET | Freq: Four times a day (QID) | ORAL | Status: DC | PRN
  Administered 2012-10-24 (×2): 650 mg via ORAL
  Filled 2012-10-23 (×2): qty 2

## 2012-10-23 MED ORDER — PANTOPRAZOLE SODIUM 40 MG PO TBEC
40.0000 mg | DELAYED_RELEASE_TABLET | Freq: Two times a day (BID) | ORAL | Status: DC
  Administered 2012-10-23 – 2012-11-01 (×18): 40 mg via ORAL
  Filled 2012-10-23 (×18): qty 1

## 2012-10-23 MED ORDER — WARFARIN SODIUM 5 MG PO TABS
5.0000 mg | ORAL_TABLET | Freq: Once | ORAL | Status: AC
  Administered 2012-10-23: 5 mg via ORAL
  Filled 2012-10-23 (×2): qty 1

## 2012-10-23 MED ORDER — MOMETASONE FURO-FORMOTEROL FUM 100-5 MCG/ACT IN AERO
2.0000 | INHALATION_SPRAY | Freq: Two times a day (BID) | RESPIRATORY_TRACT | Status: DC
  Administered 2012-10-23 – 2012-11-01 (×18): 2 via RESPIRATORY_TRACT
  Filled 2012-10-23: qty 8.8

## 2012-10-23 MED ORDER — PRASUGREL HCL 10 MG PO TABS
10.0000 mg | ORAL_TABLET | Freq: Every day | ORAL | Status: DC
  Filled 2012-10-23 (×2): qty 1

## 2012-10-23 MED ORDER — FUROSEMIDE 40 MG PO TABS
40.0000 mg | ORAL_TABLET | Freq: Every day | ORAL | Status: DC
  Administered 2012-10-24 – 2012-11-01 (×9): 40 mg via ORAL
  Filled 2012-10-23 (×9): qty 1

## 2012-10-23 MED ORDER — IPRATROPIUM BROMIDE 0.02 % IN SOLN: 0.5000 mg | Freq: Four times a day (QID) | RESPIRATORY_TRACT | Status: DC

## 2012-10-23 MED ORDER — IPRATROPIUM-ALBUTEROL 0.5-2.5 (3) MG/3ML IN SOLN: 3.0000 mL | Freq: Four times a day (QID) | RESPIRATORY_TRACT | Status: DC

## 2012-10-23 MED ORDER — DEXTROSE 5 % IV SOLN
1.0000 g | Freq: Once | INTRAVENOUS | Status: AC
  Administered 2012-10-23: 1 g via INTRAVENOUS
  Filled 2012-10-23: qty 10

## 2012-10-23 MED ORDER — WARFARIN SODIUM 2.5 MG PO TABS: 2.5000 mg | ORAL_TABLET | Freq: Every day | ORAL | Status: DC

## 2012-10-23 MED ORDER — POTASSIUM CHLORIDE ER 10 MEQ PO TBCR
20.0000 meq | EXTENDED_RELEASE_TABLET | Freq: Every day | ORAL | Status: DC
  Administered 2012-10-23 – 2012-10-24 (×2): 20 meq via ORAL
  Filled 2012-10-23: qty 1
  Filled 2012-10-23 (×3): qty 2
  Filled 2012-10-23: qty 1
  Filled 2012-10-23: qty 2

## 2012-10-23 MED ORDER — POTASSIUM CHLORIDE ER 10 MEQ PO TBCR
20.0000 meq | EXTENDED_RELEASE_TABLET | Freq: Every day | ORAL | Status: DC
  Filled 2012-10-23 (×2): qty 2

## 2012-10-23 MED ORDER — CARVEDILOL 3.125 MG PO TABS
6.2500 mg | ORAL_TABLET | Freq: Two times a day (BID) | ORAL | Status: DC
  Administered 2012-10-24 – 2012-11-01 (×17): 6.25 mg via ORAL
  Filled 2012-10-23 (×2): qty 2
  Filled 2012-10-23: qty 1
  Filled 2012-10-23: qty 2
  Filled 2012-10-23 (×2): qty 1
  Filled 2012-10-23 (×2): qty 2
  Filled 2012-10-23 (×2): qty 1
  Filled 2012-10-23 (×4): qty 2
  Filled 2012-10-23: qty 1
  Filled 2012-10-23 (×3): qty 2
  Filled 2012-10-23: qty 1
  Filled 2012-10-23 (×5): qty 2

## 2012-10-23 MED ORDER — ALBUTEROL SULFATE (5 MG/ML) 0.5% IN NEBU: 2.5000 mg | INHALATION_SOLUTION | Freq: Four times a day (QID) | RESPIRATORY_TRACT | Status: DC | PRN

## 2012-10-23 MED ORDER — IPRATROPIUM BROMIDE 0.02 % IN SOLN: 0.5000 mg | RESPIRATORY_TRACT | Status: DC | PRN

## 2012-10-23 MED ORDER — IOHEXOL 350 MG/ML SOLN
100.0000 mL | Freq: Once | INTRAVENOUS | Status: AC | PRN
  Administered 2012-10-23: 100 mL via INTRAVENOUS

## 2012-10-23 MED ORDER — DEXTROSE 5 % IV SOLN
500.0000 mg | INTRAVENOUS | Status: DC
  Administered 2012-10-24 – 2012-10-26 (×3): 500 mg via INTRAVENOUS
  Filled 2012-10-23 (×4): qty 500

## 2012-10-23 MED ORDER — NITROGLYCERIN 0.4 MG SL SUBL: 0.4000 mg | SUBLINGUAL_TABLET | SUBLINGUAL | Status: DC | PRN

## 2012-10-23 MED ORDER — ASPIRIN EC 81 MG PO TBEC
81.0000 mg | DELAYED_RELEASE_TABLET | Freq: Every day | ORAL | Status: DC
  Administered 2012-10-24 – 2012-11-01 (×9): 81 mg via ORAL
  Filled 2012-10-23 (×9): qty 1

## 2012-10-23 MED ORDER — PANTOPRAZOLE SODIUM 40 MG PO TBEC: 40.0000 mg | DELAYED_RELEASE_TABLET | Freq: Every day | ORAL | Status: DC

## 2012-10-23 MED ORDER — DEXTROSE 5 % IV SOLN: 1.0000 g | INTRAVENOUS | Status: DC

## 2012-10-23 MED ORDER — DEXTROSE 5 % IV SOLN
1.0000 g | INTRAVENOUS | Status: AC
  Administered 2012-10-24 – 2012-10-30 (×7): 1 g via INTRAVENOUS
  Filled 2012-10-23 (×7): qty 10

## 2012-10-23 MED ORDER — IPRATROPIUM BROMIDE 0.02 % IN SOLN
0.5000 mg | RESPIRATORY_TRACT | Status: DC
  Administered 2012-10-23 – 2012-10-24 (×3): 0.5 mg via RESPIRATORY_TRACT
  Filled 2012-10-23 (×3): qty 2.5

## 2012-10-23 MED ORDER — ALBUTEROL SULFATE (5 MG/ML) 0.5% IN NEBU: 2.5000 mg | INHALATION_SOLUTION | RESPIRATORY_TRACT | Status: DC | PRN

## 2012-10-23 MED ORDER — IPRATROPIUM BROMIDE 0.02 % IN SOLN: 0.5000 mg | Freq: Four times a day (QID) | RESPIRATORY_TRACT | Status: DC | PRN

## 2012-10-23 MED ORDER — IPRATROPIUM-ALBUTEROL 0.5-2.5 (3) MG/3ML IN SOLN: 3.0000 mL | Freq: Four times a day (QID) | RESPIRATORY_TRACT | Status: DC | PRN

## 2012-10-23 MED ORDER — ALBUTEROL SULFATE (5 MG/ML) 0.5% IN NEBU: 2.5000 mg | INHALATION_SOLUTION | RESPIRATORY_TRACT | Status: DC

## 2012-10-23 MED ORDER — DEXTROSE 5 % IV SOLN: 500.0000 mg | INTRAVENOUS | Status: DC

## 2012-10-23 MED ORDER — ALBUTEROL SULFATE (5 MG/ML) 0.5% IN NEBU
2.5000 mg | INHALATION_SOLUTION | RESPIRATORY_TRACT | Status: DC
  Administered 2012-10-23 – 2012-10-24 (×3): 2.5 mg via RESPIRATORY_TRACT
  Filled 2012-10-23 (×3): qty 0.5

## 2012-10-23 MED ORDER — FUROSEMIDE 40 MG PO TABS: 40.0000 mg | ORAL_TABLET | Freq: Every day | ORAL | Status: DC

## 2012-10-23 MED ORDER — AZITHROMYCIN 250 MG PO TABS
500.0000 mg | ORAL_TABLET | Freq: Once | ORAL | Status: AC
  Administered 2012-10-23: 500 mg via ORAL
  Filled 2012-10-23: qty 1

## 2012-10-23 MED ORDER — SODIUM CHLORIDE 0.9 % IV SOLN
INTRAVENOUS | Status: DC
  Administered 2012-10-23: 14:00:00 via INTRAVENOUS
  Administered 2012-10-23 – 2012-10-27 (×3): 20 mL/h via INTRAVENOUS

## 2012-10-23 MED ORDER — WARFARIN - PHARMACIST DOSING INPATIENT
Status: DC
  Administered 2012-10-24: 16:00:00

## 2012-10-23 MED ORDER — PRASUGREL HCL 10 MG PO TABS
10.0000 mg | ORAL_TABLET | Freq: Every day | ORAL | Status: DC
  Administered 2012-10-24 – 2012-11-01 (×9): 10 mg via ORAL
  Filled 2012-10-23 (×13): qty 1

## 2012-10-23 MED ORDER — MOMETASONE FURO-FORMOTEROL FUM 100-5 MCG/ACT IN AERO
INHALATION_SPRAY | RESPIRATORY_TRACT | Status: AC
  Filled 2012-10-23: qty 8.8

## 2012-10-23 MED ORDER — LOSARTAN POTASSIUM 50 MG PO TABS
50.0000 mg | ORAL_TABLET | Freq: Every day | ORAL | Status: DC
  Administered 2012-10-24 – 2012-10-25 (×2): 50 mg via ORAL
  Filled 2012-10-23 (×2): qty 1

## 2012-10-23 NOTE — ED Notes (Signed)
States he started having shortness of breath and chest pain today, states he was recently diagnosed with a blood clot "on his heart".

## 2012-10-23 NOTE — ED Provider Notes (Signed)
History  This chart was scribed for Shelda Jakes, MD by Bennett Scrape, ED Scribe. This patient was seen in room [facility]02/[facility] and the patient's care was started at 11:54 AM.  CSN: [id number] Arrival date & time 10/23/12  1036  First MD Initiated Contact with Patient 10/23/12 1058     Chief Complaint  Patient presents with  . Chest Pain    Patient is a 57 y.o. male presenting with chest pain. The history is provided by the patient. No language interpreter was used.  Chest Pain Pain location:  R chest Duration:  1 hour Timing:  Constant Progression:  Resolved Associated symptoms: shortness of breath   Associated symptoms: no abdominal pain, no back pain, no cough, no dizziness, no fever, no headache, no nausea and not vomiting   Risk factors: coronary artery disease and hypertension     HPI Comments: Danny [name] is a 57 y.o. male with a h/o CA, CHF and COPD who presents to the Emergency Department complaining of mild CP now resolved with associated SOB that started around 9:30 AM today. The CP was located on the right side and he rates the pain a 2 out of 10. He states that the CP resolved around 10:45 AM in the ED. He reports that the SOB started yesterday but worsened this morning while ambulating to the car. He has a h/o MI in January 2014 with one cardiac stent placement. He denies having CP since the stent placement. Wife states that he had an echo on 10/18/12 and was diagnosed with a "blood clot at the chamber that leads to the lungs". CT scan performed through his Cardiology office was negative at the time. He was started on coumadin and coreg for CHF. He reports blurred vision that occurs with exertion and fatigue but denies fevers, nausea and abdominal pain. Pt is on 4L oxygen at home for CHF.  Pt reports pain in the right forearm from an IV venous bleed that the pt's nurse noticed from swelling. The area was wrapped in an ACE bandage. He denies pain with movement of  fingers. Radial pulse was 2+ when it first happened .  PCP is Dr. Delbert Harness Cards is Dr. Diona Browner with [facility]   Past Medical History  Diagnosis Date  . GERD (gastroesophageal reflux disease)   . COPD (chronic obstructive pulmonary disease)   . Myocardial infarction 05/02/12  . CHF (congestive heart failure)   . Hypertension    Past Surgical History  Procedure Laterality Date  . Lung removal, partial Bilateral 04/26/2000  . Cardiac catheterization    . Coronary stent placement  05/06/12   No family history on file. History  Substance Use Topics  . Smoking status: Former Smoker -- 2.00 packs/day for 25 years    Types: Cigarettes    Quit date: 04/03/1994  . Smokeless tobacco: Never Used  . Alcohol Use: Not on file    Review of Systems  Constitutional: Negative for fever and chills.  HENT: Positive for congestion. Negative for sore throat and neck pain.   Eyes: Positive for visual disturbance.  Respiratory: Positive for shortness of breath. Negative for cough.   Cardiovascular: Positive for chest pain and leg swelling (chronic, improves with Lasix, denies changes ).  Gastrointestinal: Negative for nausea, vomiting, abdominal pain and diarrhea.  Genitourinary: Negative for dysuria.  Musculoskeletal: Negative for back pain.  Skin: Negative for rash.  Neurological: Negative for dizziness and headaches.  Hematological: Bruises/bleeds easily (on coumadin).  Psychiatric/Behavioral: Negative for  confusion.    Allergies  Lipitor  Home Medications   Current Outpatient Rx  Name  Route  Sig  Dispense  Refill  . acetaminophen (TYLENOL) 325 MG tablet   Oral   Take 650 mg by mouth every 6 (six) hours as needed for pain.          Marland Kitchen albuterol (PROVENTIL HFA;VENTOLIN HFA) 108 (90 BASE) MCG/ACT inhaler   Inhalation   Inhale 2 puffs into the lungs every 6 (six) hours as needed for wheezing.         Marland Kitchen aspirin EC 81 MG tablet   Oral   Take 81 mg by mouth daily.         .  carvedilol (COREG) 6.25 MG tablet   Oral   Take 1 tablet (6.25 mg total) by mouth 2 (two) times daily.   180 tablet   3   . Fluticasone-Salmeterol (ADVAIR) 250-50 MCG/DOSE AEPB   Inhalation   Inhale 1 puff into the lungs every 12 (twelve) hours.         . furosemide (LASIX) 40 MG tablet   Oral   Take 1 tablet (40 mg total) by mouth daily.   30 tablet   6   . ipratropium-albuterol (DUONEB) 0.5-2.5 (3) MG/3ML SOLN   Nebulization   Take 3 mLs by nebulization every 6 (six) hours as needed (Shortness of breath).         . losartan (COZAAR) 100 MG tablet   Oral   Take 0.5 tablets (50 mg total) by mouth daily.   30 tablet   6   . Multiple Vitamins-Minerals (MENS MULTIVITAMIN PLUS) TABS   Oral   Take 1 tablet by mouth daily.          Marland Kitchen omeprazole (PRILOSEC) 20 MG capsule   Oral   Take 20 mg by mouth 2 (two) times daily.          . potassium chloride (K-DUR) 10 MEQ tablet   Oral   Take 2 tablets (20 mEq total) by mouth daily.   60 tablet   6   . prasugrel (EFFIENT) 10 MG TABS   Oral   Take 10 mg by mouth daily.         . rosuvastatin (CRESTOR) 10 MG tablet   Oral   Take 10 mg by mouth daily.         Marland Kitchen warfarin (COUMADIN) 2.5 MG tablet   Oral   Take 2.5 mg by mouth daily.         . nitroGLYCERIN (NITROSTAT) 0.4 MG SL tablet   Sublingual   Place 1 tablet (0.4 mg total) under the tongue every 5 (five) minutes as needed for chest pain.   90 tablet   3    Triage Vitals: BP 128/86  Pulse 107  Temp(Src) 98.6 F (37 C) (Oral)  Resp 23  Ht 5\' 7"  (1.702 m)  Wt 159 lb (72.122 kg)  BMI 24.9 kg/m2  SpO2 92%  Physical Exam  Nursing note and vitals reviewed. Constitutional: He is oriented to person, place, and time. He appears well-developed and well-nourished. No distress.  HENT:  Head: Normocephalic and atraumatic.  Mouth/Throat: Oropharynx is clear and moist.  Eyes: Conjunctivae and EOM are normal. Pupils are equal, round, and reactive to light.   Sclera are clear  Neck: Neck supple. No tracheal deviation present.  Cardiovascular: Normal rate and regular rhythm.   No murmur heard. Pulses:      Radial pulses are  2+ on the right side, and 2+ on the left side.       Dorsalis pedis pulses are 2+ on the right side, and 2+ on the left side.  Pulmonary/Chest: Effort normal and breath sounds normal. No respiratory distress. He has no wheezes.  Abdominal: Soft. Bowel sounds are normal. He exhibits no distension. There is no tenderness.  Musculoskeletal: Normal range of motion. He exhibits no edema (no ankle swelling).  Lymphadenopathy:    He has no cervical adenopathy.  Neurological: He is alert and oriented to person, place, and time. No cranial nerve deficit.  Pt able to move both sets of fingers and toes  Skin: Skin is warm and dry. No rash noted.  Psychiatric: He has a normal mood and affect. His behavior is normal.    ED Course  Procedures (including critical care time)  DIAGNOSTIC STUDIES: Oxygen Saturation is 95% on 2L [place], adequate by my interpretation.    COORDINATION OF CARE: 12:10 PM-Discussed treatment plan which includes CXR, CBC panel, BMP and troponin with pt at bedside and pt agreed to plan.   Labs Reviewed  BASIC METABOLIC PANEL - Abnormal; Notable for the following:    CO2 37 (*)    Glucose, Bld 179 (*)    GFR calc non Af Amer 68 (*)    GFR calc Af Amer 79 (*)    All other components within normal limits  PRO B NATRIURETIC PEPTIDE - Abnormal; Notable for the following:    Pro B Natriuretic peptide (BNP) 2088.0 (*)    All other components within normal limits  TROPONIN I  CBC WITH DIFFERENTIAL  TROPONIN I  PROTIME-INR   Ct Angio Chest Pe W/cm &/or Wo Cm  10/23/2012   *RADIOLOGY REPORT*  Clinical Data: Right-sided chest pain and shortness of breath since this morning.  CT ANGIOGRAPHY CHEST  Technique:  Multidetector CT imaging of the chest using the standard protocol during bolus administration of intravenous  contrast. Multiplanar reconstructed images including MIPs were obtained and reviewed to evaluate the vascular anatomy.  Contrast: OMNIPAQUE IOHEXOL 350 MG/ML SOLN  Comparison: Chest radiograph, 10/23/2012.  Chest CT, 08/04/2008.  Findings: There is no evidence of a pulmonary embolism.  The heart is enlarged, but stable.  There is a short left coronary artery stents.  The no mediastinal or hilar masses are appreciated. Fascia on the right subcarinal lymph node is mildly enlarged measuring 14 mm in short axis.  There are prominent hilar lymph nodes bilaterally are not well defined.  There are changes of emphysema and lung scarring.  There is a new 8 mm irregular focal opacity in the left upper lobe that may reflect additional scarring.  Neoplastic disease is possible.  There is some ground-glass type opacity in the posterior lateral right lower lobe which was not present previously.  This is somewhat nonspecific.  It could reflect an alveolitis.  No other evidence of infection/inflammation.  No other change from the prior CT within the lungs.  There are multiple old rib fractures on the right.  Limited evaluation of the upper abdomen is unremarkable.  There are minor degenerative changes of the thoracic spine.  No osteoblastic or osteolytic lesions.  IMPRESSION: No evidence of a pulmonary embolism.  Significant emphysema and areas of lung scarring.  Mild ground-glass opacity in the right lower lobe, new from the prior study.  Consider an acute inflammatory or infectious alveolitis in the proper clinical setting.  8 mm focal opacity in the left upper lobe  is new from prior study. If the patient is at high risk for bronchogenic carcinoma, follow- up chest CT at 3-6 months is recommended.  If the patient is at low risk for bronchogenic carcinoma, follow-up chest CT at 6-12 months is recommended.  This recommendation follows the consensus statement: Guidelines for Management of Small Pulmonary Nodules Detected on CT  Scans: A Statement from the Fleischner Society as published in Radiology 2005; 237:395-400.   Original Report Authenticated By: Amie Portland, M.D.   Dg Chest Portable 1 View  10/23/2012   *RADIOLOGY REPORT*  Clinical Data: Chest pain.  PORTABLE CHEST - 1 VIEW  Comparison: 10/18/2012.  Findings: Trachea is midline.  Heart size stable.  Postoperative changes are seen in the hemithoraces bilaterally with associated scarring, volume loss and architectural distortion.  Findings do not appear significantly changed from 10/18/2012.  No definite pleural fluid.  Left hemidiaphragm is mildly elevated, stable.  IMPRESSION: Postoperative changes in the hemithoraces bilaterally with associated scarring and volume loss.   Original Report Authenticated By: Leanna Battles, M.D.   Results for orders placed during the hospital encounter of 10/23/12  TROPONIN I      Result Value Range   Troponin I <0.30  <0.30 ng/mL  CBC WITH DIFFERENTIAL      Result Value Range   WBC 5.8  4.0 - 10.5 K/uL   RBC 4.66  4.22 - 5.81 MIL/uL   Hemoglobin 15.5  13.0 - 17.0 g/dL   HCT 32.4  40.1 - 02.7 %   MCV 99.4  78.0 - 100.0 fL   MCH 33.3  26.0 - 34.0 pg   MCHC 33.5  30.0 - 36.0 g/dL   RDW 25.3  66.4 - 40.3 %   Platelets 190  150 - 400 K/uL   Neutrophils Relative % 55  43 - 77 %   Neutro Abs 3.2  1.7 - 7.7 K/uL   Lymphocytes Relative 32  12 - 46 %   Lymphs Abs 1.9  0.7 - 4.0 K/uL   Monocytes Relative 11  3 - 12 %   Monocytes Absolute 0.6  0.1 - 1.0 K/uL   Eosinophils Relative 2  0 - 5 %   Eosinophils Absolute 0.1  0.0 - 0.7 K/uL   Basophils Relative 1  0 - 1 %   Basophils Absolute 0.0  0.0 - 0.1 K/uL  BASIC METABOLIC PANEL      Result Value Range   Sodium 140  135 - 145 mEq/L   Potassium 3.7  3.5 - 5.1 mEq/L   Chloride 99  96 - 112 mEq/L   CO2 37 (*) 19 - 32 mEq/L   Glucose, Bld 179 (*) 70 - 99 mg/dL   BUN 15  6 - 23 mg/dL   Creatinine, Ser 4.74  0.50 - 1.35 mg/dL   Calcium 9.0  8.4 - 25.9 mg/dL   GFR calc non Af  Amer 68 (*) >90 mL/min   GFR calc Af Amer 79 (*) >90 mL/min  PRO B NATRIURETIC PEPTIDE      Result Value Range   Pro B Natriuretic peptide (BNP) 2088.0 (*) 0 - 125 pg/mL    Date: 10/23/2012  Rate: 108  Rhythm: sinus tachycardia  QRS Axis: right  Intervals: normal  ST/T Wave abnormalities: nonspecific ST/T changes  Conduction Disutrbances:none  Narrative Interpretation:   Old EKG Reviewed: changes noted Today's EKG with right ventricular hypertrophy. Inferior infarct age undetermined ST and T wave abnormality consider anterior lateral ischemia no STEMI. Old  EKG in 2002 there are changes in the ST segments since that time that one showed more of a concern for anterior infarct however that EKG was done long before the stents.     1. Chest pain   2. Shortness of breath     MDM  Patient is followed by [facility] cardiology. Patient has a history of stents history of COPD history of MI in January of 2014 the stents were done history of CHF. Patient had echocardiogram done on Friday which the blood clots as well as started on the Coumadin patient also CT of his chest was negative for PE. Today's repeat CT of chest negative for PE. First Tn negative. EKG without acute changes.   Discussed with Dr Excell Seltzer Cards at [facility], cleared for admission here. Will DW Hospitalist. Will also Rx for possible pneumonia CAP with Rocephin and Zithro. Patient remains Chest Pain free. To be admitted by [facility].  I personally performed the services described in this documentation, which was scribed in my presence. The recorded information has been reviewed and is accurate.      Shelda Jakes, MD 10/23/12 [phone]

## 2012-10-23 NOTE — ED Notes (Signed)
Pt got up to use restroom and o2 sats dropped to 84%. Pt got back in bed and o2 sats slowly rose back up to 95%.

## 2012-10-23 NOTE — Telephone Encounter (Signed)
Pt wife called in to advise pt his SOB is getting worse, HR is increasing to 104 at at times as well  noted weakness all day yesterday, denies swelling, chest pain, but notes a heaviness in his chest as well as a feeling of fluid build up, advised pt per recent dx of clot and current sxs pt will need to be seen in the ED, pt wife agreed and will have pt transported to [facility] asap

## 2012-10-23 NOTE — Progress Notes (Signed)
ANTICOAGULATION CONSULT NOTE - Initial Consult  Pharmacy Consult for Coumadin (chronic PTA) Indication: right ventricular thrombus  Allergies  Allergen Reactions  . Lipitor (Atorvastatin) Other (See Comments)    Muscle spasms    Patient Measurements: Height: 5\' 7"  (170.2 cm) Weight: 159 lb (72.122 kg) IBW/kg (Calculated) : 66.1  Vital Signs: Temp: 98.6 F (37 C) (07/23 1048) Temp src: Oral (07/23 1048) BP: 131/99 mmHg (07/23 1600) Pulse Rate: 101 (07/23 1600)  Labs:  Recent Labs  10/21/12 1100 10/23/12 1055 10/23/12 1615  HGB  --  15.5  --   HCT  --  46.3  --   PLT  --  190  --   LABPROT  --  19.9*  --   INR 2.8 1.75*  --   CREATININE  --  1.18  --   TROPONINI  --  <0.30 <0.30   Estimated Creatinine Clearance: 66.1 ml/min (by C-G formula based on Cr of 1.18).  Medical History: Past Medical History  Diagnosis Date  . GERD (gastroesophageal reflux disease)   . COPD (chronic obstructive pulmonary disease)   . Myocardial infarction 05/02/12  . CHF (congestive heart failure)   . Hypertension    Medications:   (Not in a hospital admission)  Assessment: 58yo male on chronic Coumadin due to h/o right ventricular thrombus.  Pt's home dose is reportedly 2.5mg  daily.  INR is subtherapeutic on admission. Goal of Therapy:  INR 2-3 Monitor platelets by anticoagulation protocol: Yes   Plan:  Coumadin 5mg  today x 1 (to boost INR) INR daily  Valrie Hart A 10/23/2012,5:01 PM

## 2012-10-23 NOTE — H&P (Signed)
[facility] History and Physical  ROBBEN JAGIELLO WUJ:[id number] DOB: May 08, 1955 DOA: 10/23/2012  Referring physician: ER. PCP: Isabella Stalling, MD  Specialists: Dr. Diona Browner, [facility].  Chief Complaint: Cough, dyspnea.  HPI: Danny [name] is a 57 y.o. male who presents with cough and dyspnea for the last 2 days or so. He denies a fever. The cough is nonproductive. He is feeling generally weak and has no energy. He does have a history of myocardial infarction in February of 2014, in [place], [place] and he underwent stent at that time. His ejection fraction was reportedly normal in February with right ventricular dilatation and right atrial dilatation. He was seen recently in the cardiology clinic when a repeat echocardiogram was done. At this time a right ventricular thrombus was found in association with right ventricular systolic dysfunction and right atrial dilatation. He was therefore started on anticoagulation with warfarin. His ejection fraction was 40-45% at this time. He was noted to have moderate left ventricular concentric hypertrophy also. He does have a history of COPD with significant emphysema and lung scarring. He has a history of bullous emphysema and underwent bilateral bullectomy in 2002.   Review of Systems:.  Apart from history of present illness, other systems negative.  Past Medical History  Diagnosis Date  . GERD (gastroesophageal reflux disease)   . COPD (chronic obstructive pulmonary disease)   . Myocardial infarction 05/02/12  . CHF (congestive heart failure)   . Hypertension    Past Surgical History  Procedure Laterality Date  . Lung removal, partial Bilateral 04/26/2000  . Cardiac catheterization    . Coronary stent placement  05/06/12   Social History:  He is married and lives with his wife. He quit smoking cigarettes 18 years ago. He does not drink excessive alcohol.  Allergies  Allergen Reactions  . Lipitor (Atorvastatin) Other  (See Comments)    Muscle spasms     No family history on file. noncontributory.   Prior to Admission medications   Medication Sig Start Date End Date Taking? Authorizing Provider  acetaminophen (TYLENOL) 325 MG tablet Take 650 mg by mouth every 6 (six) hours as needed for pain.    Yes Historical Provider, MD  albuterol (PROVENTIL HFA;VENTOLIN HFA) 108 (90 BASE) MCG/ACT inhaler Inhale 2 puffs into the lungs every 6 (six) hours as needed for wheezing.   Yes Historical Provider, MD  aspirin EC 81 MG tablet Take 81 mg by mouth daily.   Yes Historical Provider, MD  carvedilol (COREG) 6.25 MG tablet Take 1 tablet (6.25 mg total) by mouth 2 (two) times daily. 10/18/12  Yes Jodelle Gross, NP  Fluticasone-Salmeterol (ADVAIR) 250-50 MCG/DOSE AEPB Inhale 1 puff into the lungs every 12 (twelve) hours.   Yes Historical Provider, MD  furosemide (LASIX) 40 MG tablet Take 1 tablet (40 mg total) by mouth daily. 10/14/12  Yes Jodelle Gross, NP  ipratropium-albuterol (DUONEB) 0.5-2.5 (3) MG/3ML SOLN Take 3 mLs by nebulization every 6 (six) hours as needed (Shortness of breath). 05/10/12  Yes Leslye Peer, MD  losartan (COZAAR) 100 MG tablet Take 0.5 tablets (50 mg total) by mouth daily. 10/14/12  Yes Jodelle Gross, NP  Multiple Vitamins-Minerals (MENS MULTIVITAMIN PLUS) TABS Take 1 tablet by mouth daily.    Yes Historical Provider, MD  omeprazole (PRILOSEC) 20 MG capsule Take 20 mg by mouth 2 (two) times daily.    Yes Historical Provider, MD  potassium chloride (K-DUR) 10 MEQ tablet Take 2 tablets (20 mEq total)  by mouth daily. 10/18/12  Yes Jodelle Gross, NP  prasugrel (EFFIENT) 10 MG TABS Take 10 mg by mouth daily.   Yes Historical Provider, MD  rosuvastatin (CRESTOR) 10 MG tablet Take 10 mg by mouth daily.   Yes Historical Provider, MD  warfarin (COUMADIN) 2.5 MG tablet Take 2.5 mg by mouth daily. 10/21/12  Yes Jonelle Sidle, MD  nitroGLYCERIN (NITROSTAT) 0.4 MG SL tablet Place 1 tablet  (0.4 mg total) under the tongue every 5 (five) minutes as needed for chest pain. 09/18/12   Wendall Stade, MD   Physical Exam: Filed Vitals:   10/23/12 1245 10/23/12 1300 10/23/12 1315 10/23/12 1500  BP: 140/95 160/100 133/95 146/101  Pulse: 97 107 90 95  Temp:      TempSrc:      Resp:      Height:      Weight:      SpO2: 96%  98% 93%     General:  He looks systemically well. Is not toxic or septic. No peripheral central cyanosis.  Eyes: No pallor. No jaundice.  ENT: Unremarkable.  Neck: No lymphadenopathy. No thyromegaly.  Cardiovascular: Heart sounds are present with a sinus tachycardia. There is no gallop rhythm. Jugular venous pressure is not raised. There are no murmurs.  Respiratory: Lung fields show probable bronchial breathing in the right mid and lower lung fields consistent with consolidation. There are no crackles or wheezing. Air entry otherwise is somewhat reduced throughout both lung fields, consistent with emphysema. He is not in any significant respiratory distress.  Abdomen: Soft, nontender. No hepatomegaly.  Skin: No rashes.  Musculoskeletal: No acute joint abnormalities.  Psychiatric: Appropriate affect.  Neurologic: Alert and orientated without any focal neurological signs.  Labs on Admission:  Basic Metabolic Panel:  Recent Labs Lab 10/23/12 1055  NA 140  K 3.7  CL 99  CO2 37*  GLUCOSE 179*  BUN 15  CREATININE 1.18  CALCIUM 9.0      CBC:  Recent Labs Lab 10/23/12 1055  WBC 5.8  NEUTROABS 3.2  HGB 15.5  HCT 46.3  MCV 99.4  PLT 190   Cardiac Enzymes:  Recent Labs Lab 10/23/12 1055  TROPONINI <0.30    BNP (last 3 results)  Recent Labs  10/02/12 1015 10/23/12 1055  PROBNP 2474.00* 2088.0*      Radiological Exams on Admission: Ct Angio Chest Pe W/cm &/or Wo Cm  10/23/2012   *RADIOLOGY REPORT*  Clinical Data: Right-sided chest pain and shortness of breath since this morning.  CT ANGIOGRAPHY CHEST  Technique:   Multidetector CT imaging of the chest using the standard protocol during bolus administration of intravenous contrast. Multiplanar reconstructed images including MIPs were obtained and reviewed to evaluate the vascular anatomy.  Contrast: OMNIPAQUE IOHEXOL 350 MG/ML SOLN  Comparison: Chest radiograph, 10/23/2012.  Chest CT, 08/04/2008.  Findings: There is no evidence of a pulmonary embolism.  The heart is enlarged, but stable.  There is a short left coronary artery stents.  The no mediastinal or hilar masses are appreciated. Fascia on the right subcarinal lymph node is mildly enlarged measuring 14 mm in short axis.  There are prominent hilar lymph nodes bilaterally are not well defined.  There are changes of emphysema and lung scarring.  There is a new 8 mm irregular focal opacity in the left upper lobe that may reflect additional scarring.  Neoplastic disease is possible.  There is some ground-glass type opacity in the posterior lateral right lower lobe which  was not present previously.  This is somewhat nonspecific.  It could reflect an alveolitis.  No other evidence of infection/inflammation.  No other change from the prior CT within the lungs.  There are multiple old rib fractures on the right.  Limited evaluation of the upper abdomen is unremarkable.  There are minor degenerative changes of the thoracic spine.  No osteoblastic or osteolytic lesions.  IMPRESSION: No evidence of a pulmonary embolism.  Significant emphysema and areas of lung scarring.  Mild ground-glass opacity in the right lower lobe, new from the prior study.  Consider an acute inflammatory or infectious alveolitis in the proper clinical setting.  8 mm focal opacity in the left upper lobe is new from prior study. If the patient is at high risk for bronchogenic carcinoma, follow- up chest CT at 3-6 months is recommended.  If the patient is at low risk for bronchogenic carcinoma, follow-up chest CT at 6-12 months is recommended.  This  recommendation follows the consensus statement: Guidelines for Management of Small Pulmonary Nodules Detected on CT Scans: A Statement from the Fleischner Society as published in Radiology 2005; 237:395-400.   Original Report Authenticated By: Amie Portland, M.D.   Dg Chest Portable 1 View  10/23/2012   *RADIOLOGY REPORT*  Clinical Data: Chest pain.  PORTABLE CHEST - 1 VIEW  Comparison: 10/18/2012.  Findings: Trachea is midline.  Heart size stable.  Postoperative changes are seen in the hemithoraces bilaterally with associated scarring, volume loss and architectural distortion.  Findings do not appear significantly changed from 10/18/2012.  No definite pleural fluid.  Left hemidiaphragm is mildly elevated, stable.  IMPRESSION: Postoperative changes in the hemithoraces bilaterally with associated scarring and volume loss.   Original Report Authenticated By: Leanna Battles, M.D.    EKG: Independently reviewed. Sinus tachycardia with right ventricular hypertrophy and nonspecific ST-T wave changes. No evidence of ST elevation.  Assessment/Plan Active Problems:   Community acquired pneumonia   COPD   CAD (coronary artery disease)   RV (right ventricular) mural thrombus   Long term (current) use of anticoagulants   HTN (hypertension)   1. Community acquired pneumonia in the right lung. 2. Right ventricular systolic dysfunction. 3. Right atrial dilatation. 4. Right ventricular thrombus, on Coumadin. 5. COPD, mainly emphysema. 6. Hypertension. 7. Coronary artery disease with myocardial infarction in February 2014 with ischemic cardiomyopathy, ejection fraction 40-45%. No clinical evidence of heart failure.  Plan: 1. Admit to medical floor. 2. Intravenous antibiotics for community-acquired pneumonia. 3. Continue with anticoagulation. Continue with home medications. 4. Cardiology consultation. Further recommendations will depend on patient's hospital progress.   Code Status: Full  code.  Family Communication: Discussed plan with patient and wife at the bedside.   Disposition Plan: Home when medically stable.  Time spent: 45 minutes.  Wilson Singer [facility] Pager [phone]  If 7PM-7AM, please contact night-coverage [email or web address] Password [facility] 10/23/2012, 4:49 PM

## 2012-10-23 NOTE — ED Notes (Signed)
EDP in with pt at this time for evaluation

## 2012-10-24 LAB — COMPREHENSIVE METABOLIC PANEL
Albumin: 2.8 g/dL — ABNORMAL LOW (ref 3.5–5.2)
Alkaline Phosphatase: 70 U/L (ref 39–117)
BUN: 13 mg/dL (ref 6–23)
Chloride: 101 mEq/L (ref 96–112)
GFR calc Af Amer: 73 mL/min — ABNORMAL LOW (ref >90)
Glucose, Bld: 124 mg/dL — ABNORMAL HIGH (ref 70–99)
Potassium: 4 mEq/L (ref 3.5–5.1)
Total Bilirubin: 0.6 mg/dL (ref 0.3–1.2)

## 2012-10-24 LAB — HIV ANTIBODY (ROUTINE TESTING W REFLEX): HIV: NONREACTIVE

## 2012-10-24 LAB — CBC
MCV: 100 fL (ref 78.0–100.0)
Platelets: 208 10*3/uL (ref 150–400)
RDW: 13.1 % (ref 11.5–15.5)
WBC: 7 10*3/uL (ref 4.0–10.5)

## 2012-10-24 MED ORDER — IPRATROPIUM BROMIDE 0.02 % IN SOLN
0.5000 mg | Freq: Four times a day (QID) | RESPIRATORY_TRACT | Status: DC
Start: 2012-10-24 — End: 2012-10-24
  Filled 2012-10-24: qty 2.5

## 2012-10-24 MED ORDER — ALBUTEROL SULFATE (5 MG/ML) 0.5% IN NEBU
2.5000 mg | INHALATION_SOLUTION | Freq: Two times a day (BID) | RESPIRATORY_TRACT | Status: DC
  Administered 2012-10-24 – 2012-10-25 (×3): 2.5 mg via RESPIRATORY_TRACT
  Filled 2012-10-24 (×3): qty 0.5

## 2012-10-24 MED ORDER — ALBUTEROL SULFATE (5 MG/ML) 0.5% IN NEBU
2.5000 mg | INHALATION_SOLUTION | Freq: Four times a day (QID) | RESPIRATORY_TRACT | Status: DC
  Filled 2012-10-24: qty 0.5

## 2012-10-24 MED ORDER — ALBUTEROL SULFATE (5 MG/ML) 0.5% IN NEBU: 2.5000 mg | INHALATION_SOLUTION | RESPIRATORY_TRACT | Status: DC | PRN

## 2012-10-24 MED ORDER — WARFARIN SODIUM 5 MG PO TABS
5.0000 mg | ORAL_TABLET | Freq: Once | ORAL | Status: AC
  Administered 2012-10-24: 5 mg via ORAL
  Filled 2012-10-24: qty 1

## 2012-10-24 MED ORDER — IPRATROPIUM BROMIDE 0.02 % IN SOLN: 0.5000 mg | RESPIRATORY_TRACT | Status: DC | PRN

## 2012-10-24 MED ORDER — LORAZEPAM 1 MG PO TABS
1.0000 mg | ORAL_TABLET | Freq: Four times a day (QID) | ORAL | Status: DC | PRN
  Administered 2012-10-24 – 2012-10-31 (×5): 1 mg via ORAL
  Filled 2012-10-24 (×5): qty 1

## 2012-10-24 MED ORDER — POTASSIUM CHLORIDE CRYS ER 10 MEQ PO TBCR
20.0000 meq | EXTENDED_RELEASE_TABLET | Freq: Every day | ORAL | Status: DC
  Administered 2012-10-25 – 2012-10-31 (×7): 20 meq via ORAL
  Filled 2012-10-24 (×2): qty 2
  Filled 2012-10-24: qty 1
  Filled 2012-10-24 (×2): qty 2
  Filled 2012-10-24 (×2): qty 1
  Filled 2012-10-24 (×3): qty 2

## 2012-10-24 MED ORDER — IPRATROPIUM BROMIDE 0.02 % IN SOLN
0.5000 mg | Freq: Two times a day (BID) | RESPIRATORY_TRACT | Status: DC
  Administered 2012-10-24 – 2012-10-25 (×3): 0.5 mg via RESPIRATORY_TRACT
  Filled 2012-10-24 (×3): qty 2.5

## 2012-10-24 NOTE — Progress Notes (Signed)
NAMEBRALLAN, Danny NO.:  [id number]  MEDICAL RECORD NO.:  [id number]  LOCATION:  A218                          FACILITY:  [facility]  PHYSICIAN:  Danny [name], MDDATE OF BIRTH:  [birth year]  DATE OF PROCEDURE: DATE OF DISCHARGE:                                PROGRESS NOTE   The patient was admitted yesterday afternoon.  He apparently has a recent myocardial infarction in the last 8 months with stenting unknown coronary artery with stent was recently discovered a week ago to have right-sided atrial and ventricular dilatation with mural thrombus and placed on Coumadin 8 days ago by Cardiology.  His ejection fraction is 40%-45%.  He has quit smoking.  He states 18 years ago, and had a CT angio done which was negative for pulmonary emboli.  However it does reveal a prominent hilar lymph nodes, bilaterally significant emphysema, bolus type and a new 8-mm nodules in the left upper lobe, as well as ground-glass opacity in the right lower lobe which is new.  He is placed on Rocephin and Zithromax upon admission, and is doing fairly well from a pulmonary standpoint.  He is on Coumadin currently reduced to 2.5 mg per day, 2 days ago by Cardiology.  Lungs show prolonged expiratory phase.  Scattered rhonchi.  No rales, no wheeze appreciable.  Heart regular rhythm.  No S3, S4 appreciable.  No heaves, thrills, or rubs. Blood pressure is 144/96, pulse is 100 and regular, temperature 98.5. White count is 5.8, creatinine 1.18, and INR 1.75.  The plan right now is: 1. To continue Rocephin, Zithromax.  Continue nebulizer therapy.  I     will consult pulmonology to comment up on the right lower lobe     ground-glass infiltrate. 2. Left upper lobe new nodules. 3. Bullous emphysema for which he has undergone bullectomy many years     ago.     Danny Novas, MD     RMD/MEDQ  D:  10/24/2012  T:  10/24/2012  Job:  [id number]

## 2012-10-24 NOTE — Progress Notes (Signed)
[id number] 

## 2012-10-24 NOTE — Progress Notes (Signed)
Nurse called about pt having SOB and sats down to 85%. Pt o2 increased to 5lpm cann. spo2 90%  I can changed to 50% v. Mask spo2 increased to 92% thus far

## 2012-10-24 NOTE — Progress Notes (Signed)
10/24/12 1558 Discussed patient concerns regarding right forearm/elbow area swelling, tenderness, bruising again with Dr Janna Arch on rounds this afternoon. Notified patient would like for him to assess. Dr. Janna Arch states area is okay, no intervention needed at this time. [name], RN

## 2012-10-24 NOTE — Progress Notes (Signed)
ANTICOAGULATION CONSULT NOTE   Pharmacy Consult for Coumadin (chronic PTA) Indication: right ventricular thrombus  Allergies  Allergen Reactions  . Lipitor (Atorvastatin) Other (See Comments)    Muscle spasms    Patient Measurements: Height: 5\' 7"  (170.2 cm) Weight: 165 lb 9.1 oz (75.1 kg) IBW/kg (Calculated) : 66.1  Vital Signs: Temp: 98.5 F (36.9 C) (07/24 0547) Temp src: Oral (07/24 0547) BP: 144/96 mmHg (07/24 0547) Pulse Rate: 105 (07/24 0547)  Labs:  Recent Labs  10/21/12 1100 10/23/12 1055 10/23/12 1615 10/24/12 0504  HGB  --  15.5  --  14.8  HCT  --  46.3  --  45.0  PLT  --  190  --  208  LABPROT  --  19.9*  --  20.6*  INR 2.8 1.75*  --  1.83*  CREATININE  --  1.18  --  1.25  TROPONINI  --  <0.30 <0.30  --    Estimated Creatinine Clearance: 62.4 ml/min (by C-G formula based on Cr of 1.25).  Medical History: Past Medical History  Diagnosis Date  . GERD (gastroesophageal reflux disease)   . COPD (chronic obstructive pulmonary disease)   . Myocardial infarction 05/02/12  . CHF (congestive heart failure)   . Hypertension    Medications:  Prescriptions prior to admission  Medication Sig Dispense Refill  . acetaminophen (TYLENOL) 325 MG tablet Take 650 mg by mouth every 6 (six) hours as needed for pain.       Marland Kitchen albuterol (PROVENTIL HFA;VENTOLIN HFA) 108 (90 BASE) MCG/ACT inhaler Inhale 2 puffs into the lungs every 6 (six) hours as needed for wheezing.      Marland Kitchen aspirin EC 81 MG tablet Take 81 mg by mouth daily.      . carvedilol (COREG) 6.25 MG tablet Take 1 tablet (6.25 mg total) by mouth 2 (two) times daily.  180 tablet  3  . Fluticasone-Salmeterol (ADVAIR) 250-50 MCG/DOSE AEPB Inhale 1 puff into the lungs every 12 (twelve) hours.      . furosemide (LASIX) 40 MG tablet Take 1 tablet (40 mg total) by mouth daily.  30 tablet  6  . ipratropium-albuterol (DUONEB) 0.5-2.5 (3) MG/3ML SOLN Take 3 mLs by nebulization every 6 (six) hours as needed (Shortness of  breath).      . losartan (COZAAR) 100 MG tablet Take 0.5 tablets (50 mg total) by mouth daily.  30 tablet  6  . Multiple Vitamins-Minerals (MENS MULTIVITAMIN PLUS) TABS Take 1 tablet by mouth daily.       Marland Kitchen omeprazole (PRILOSEC) 20 MG capsule Take 20 mg by mouth 2 (two) times daily.       . potassium chloride (K-DUR) 10 MEQ tablet Take 2 tablets (20 mEq total) by mouth daily.  60 tablet  6  . prasugrel (EFFIENT) 10 MG TABS Take 10 mg by mouth daily.      . rosuvastatin (CRESTOR) 10 MG tablet Take 10 mg by mouth daily.      Marland Kitchen warfarin (COUMADIN) 2.5 MG tablet Take 2.5 mg by mouth daily.      . nitroGLYCERIN (NITROSTAT) 0.4 MG SL tablet Place 1 tablet (0.4 mg total) under the tongue every 5 (five) minutes as needed for chest pain.  90 tablet  3   Assessment: 57yo male on chronic Coumadin due to h/o right ventricular thrombus.  Pt's home dose is reportedly 2.5mg  daily.  INR is subtherapeutic.  Goal of Therapy:  INR 2-3 Monitor platelets by anticoagulation protocol: Yes   Plan:  Coumadin 5mg  today x 1 (to boost INR) INR daily  Valrie Hart A 10/24/2012,9:43 AM

## 2012-10-24 NOTE — Consult Note (Signed)
Danny [name], Danny [name]                ACCOUNT NO.:  [id number]  MEDICAL RECORD NO.:  [id number]  LOCATION:  A218                          FACILITY:  [facility]  PHYSICIAN:  Kathren Scearce L. Juanetta Gosling, M.D.DATE OF BIRTH:  December 16, 1955  DATE OF CONSULTATION: DATE OF DISCHARGE:                                CONSULTATION   REASON FOR CONSULTATION:  Pneumonia, hypoxic respiratory failure.  This is a 57 year old, who came to the emergency department yesterday with cough and shortness of breath.  He did not have any fever.  He said he was weak, but feels better this morning.  He has a complicated past medical history with myocardial infarction in February 2014, that was stented.  He reportedly had a normal ejection fraction with right ventricular dilatation and right atrial dilatation then.  He was found to have a right ventricular thrombus on a more recent echocardiogram. Ejection fraction was up to 40-45%.  He had LVH.  He has known severe COPD and underwent lung reduction surgery in 2002.  He says at baseline, he is somewhat short of breath.  PAST MEDICAL HISTORY:  His past medical history otherwise is positive for GERD, congestive heart failure, and he has had cardiac catheterization stent and then the lung reduction surgery.  SOCIAL HISTORY:  He is married and lives at home.  He has about a 40- pack year smoking history, but stopped about 18 years ago.  REVIEW OF SYSTEMS:  Otherwise is essentially negative.  He says he did not have any fever or chills.  HOME MEDICATIONS:  As listed in Dr. Patty Sermons admission history and physical.  In the hospital, he is on IV fluids, albuterol and Atrovent, aspirin 81 mg, Zithromax 500 mg every 24 hours, carvedilol 6.25 mg b.i.d., Rocephin 1 g every 24 hours, Lasix 40 mg daily, losartan 50 mg daily, Dulera 100/5 two puffs b.i.d., nitroglycerin as needed, Protonix 40 mg daily, Effient 10 mg daily, Coumadin 5 mg daily.  He says he has been able to cough up a little  bit of sputum now.  PHYSICAL EXAMINATION:  GENERAL:  Shows that he is awake and alert, and does not appear to be in acute distress. VITAL SIGNS:  His temperature is 98.5, pulse 105, respirations 18, blood pressure 144/96, O2 sats 92%. HEENT:  His pupils are reactive.  Nose and throat are clear.  Mucous membranes are moist. NECK:  Supple. CHEST:  Relatively clear with some rhonchi. HEART:  Regular without gallop. ABDOMEN:  Soft. EXTREMITIES:  Show no edema. CENTRAL NERVOUS SYSTEM:  Grossly intact.  Assessment then he has clinical pneumonia.  He has some acute hypoxic respiratory failure.  He has severe chronic obstructive pulmonary disease with previous lung reduction surgery.  He has had a pretty recent myocardial infarction what appears to be right heart failure and a clot in the right side of his heart.  PLAN:  Will be to continue with his current treatments.  I agree with withholding steroids for now because he looks pretty comfortable, but we may need to add that at some point.     Janece Laidlaw L. Juanetta Gosling, M.D.     ELH/MEDQ  D:  10/24/2012  T:  10/24/2012  Job:  [id number]

## 2012-10-24 NOTE — Care Management Note (Signed)
    Page 1 of 2   11/01/2012     11:11:11 AM   CARE MANAGEMENT NOTE 11/01/2012  Patient:  Danny [name], Danny [name]   Account Number:  [id number]  Date Initiated:  10/24/2012  Documentation initiated by:  Sharrie Rothman  Subjective/Objective Assessment:   Pt admitted from home with pneumonia. Pt lives with his wife and will return home at discharge. Pt is independent with ADL's. Pt has home O2 and neb machine for home use.     Action/Plan:   No CM needs noted.   Anticipated DC Date:  10/26/2012   Anticipated DC Plan:  HOME/SELF CARE      DC Planning Services  CM consult      PAC Choice  DURABLE MEDICAL EQUIPMENT   Choice offered to / List presented to:  C-1 Patient   DME arranged  BIPAP      DME agency  [facility]        Status of service:  Completed, signed off Medicare Important Message given?   (If response is "NO", the following Medicare IM given date fields will be blank) Date Medicare IM given:   Date Additional Medicare IM given:    Discharge Disposition:  HOME/SELF CARE  Per UR Regulation:    If discussed at Long Length of Stay Meetings, dates discussed:   10/29/2012  10/31/2012    Comments:  11/01/12 1110 Arlyss Queen, RN BSN CM Pt discharged home today with Bipap from [facility]. Emma with [facility] has arranged Bipap and this will be delivered to pts home post discharge. Pt already has home O2 with [facility]. No other CM needs noted. Pt and pts nurse aware of discharge arrangements.  10/31/12 1540 Arlyss Queen, RN BSN CM Bipap arranged with [facility] per pts request.  10/24/12 1244 Arlyss Queen, RN BSN CM

## 2012-10-24 NOTE — Progress Notes (Signed)
Pt reported to me this morning that he had been taking nebulizer treatments at home 3-4 times a day but he thinks they were giving him nose bleeds so he cut the treatments back at home to 1-2 a day and stated he has been doing fine with that regimen.  I told him that it may be his nose bleeds are coming from wearing oxygen at home but he claims it is the nebulizers not the oxygen.  I did a respiratory assessment on patient and changed him to BID nebs and Q4prn treatments and patient was in agreement with this.

## 2012-10-24 NOTE — Progress Notes (Signed)
Patient ID: JACOBE STUDY, male   DOB: [birth year], 57 y.o.   MRN: [id number] Chart reviewed. Patient has had recent extensive evaluation from our cardiology team. Please see notes. EKG is stable other than sinus tachycardia. His INR is slightly subtherapeutic. Recommend INR of 2-3. CT angiogram negative for pulmonary embolus. Agree with treatment of community-acquired pneumonia. Call with questions.

## 2012-10-24 NOTE — Progress Notes (Signed)
10/24/12 1151 Patient c/o discomfort to right lateral forearm, near elbow area. Swelling and tenderness, bruising noted. Pt states "they tried an IV site there in the emergency room". Pt concerned regarding bruising and swelling, states "maybe because of my Coumadin". Rates pain 5/10 with movement of arm, given tylenol 650 mg po as ordered PRN for pain. Notified Dr. Janna Arch of patient concerns. Stated okay, no new orders received. [name], RN

## 2012-10-24 NOTE — Progress Notes (Signed)
UR chart review completed.  

## 2012-10-25 LAB — BASIC METABOLIC PANEL
BUN: 15 mg/dL (ref 6–23)
CO2: 36 mEq/L — ABNORMAL HIGH (ref 19–32)
Chloride: 101 mEq/L (ref 96–112)
Creatinine, Ser: 1.2 mg/dL (ref 0.50–1.35)
Glucose, Bld: 93 mg/dL (ref 70–99)
Potassium: 3.9 mEq/L (ref 3.5–5.1)

## 2012-10-25 LAB — LEGIONELLA ANTIGEN, URINE

## 2012-10-25 LAB — EXPECTORATED SPUTUM ASSESSMENT W GRAM STAIN, RFLX TO RESP C: Special Requests: NORMAL

## 2012-10-25 MED ORDER — WARFARIN SODIUM 2.5 MG PO TABS
2.5000 mg | ORAL_TABLET | Freq: Once | ORAL | Status: AC
  Administered 2012-10-25: 2.5 mg via ORAL
  Filled 2012-10-25: qty 1

## 2012-10-25 MED ORDER — ALBUTEROL SULFATE (5 MG/ML) 0.5% IN NEBU
2.5000 mg | INHALATION_SOLUTION | Freq: Four times a day (QID) | RESPIRATORY_TRACT | Status: DC | PRN
  Administered 2012-10-26 (×2): 2.5 mg via RESPIRATORY_TRACT
  Filled 2012-10-25 (×2): qty 0.5

## 2012-10-25 MED ORDER — IPRATROPIUM BROMIDE 0.02 % IN SOLN
0.5000 mg | Freq: Four times a day (QID) | RESPIRATORY_TRACT | Status: DC | PRN
  Administered 2012-10-26 (×2): 0.5 mg via RESPIRATORY_TRACT
  Filled 2012-10-25 (×2): qty 2.5

## 2012-10-25 NOTE — Progress Notes (Signed)
[id number] 

## 2012-10-25 NOTE — Progress Notes (Signed)
ANTICOAGULATION CONSULT NOTE   Pharmacy Consult for Coumadin (chronic PTA) Indication: right ventricular thrombus  Allergies  Allergen Reactions  . Lipitor (Atorvastatin) Other (See Comments)    Muscle spasms    Patient Measurements: Height: 5\' 7"  (170.2 cm) Weight: 165 lb 9.1 oz (75.1 kg) IBW/kg (Calculated) : 66.1  Vital Signs: Temp: 98.6 F (37 C) (07/25 0500) Temp src: Oral (07/25 0500) BP: 141/99 mmHg (07/25 0500) Pulse Rate: 105 (07/25 0500)  Labs:  Recent Labs  10/23/12 1055 10/23/12 1615 10/24/12 0504 10/25/12 0455  HGB 15.5  --  14.8  --   HCT 46.3  --  45.0  --   PLT 190  --  208  --   LABPROT 19.9*  --  20.6* 23.4*  INR 1.75*  --  1.83* 2.16*  CREATININE 1.18  --  1.25 1.20  TROPONINI <0.30 <0.30  --   --    Estimated Creatinine Clearance: 65 ml/min (by C-G formula based on Cr of 1.2).  Medical History: Past Medical History  Diagnosis Date  . GERD (gastroesophageal reflux disease)   . COPD (chronic obstructive pulmonary disease)   . Myocardial infarction 05/02/12  . CHF (congestive heart failure)   . Hypertension    Medications:  Prescriptions prior to admission  Medication Sig Dispense Refill  . acetaminophen (TYLENOL) 325 MG tablet Take 650 mg by mouth every 6 (six) hours as needed for pain.       Marland Kitchen albuterol (PROVENTIL HFA;VENTOLIN HFA) 108 (90 BASE) MCG/ACT inhaler Inhale 2 puffs into the lungs every 6 (six) hours as needed for wheezing.      Marland Kitchen aspirin EC 81 MG tablet Take 81 mg by mouth daily.      . carvedilol (COREG) 6.25 MG tablet Take 1 tablet (6.25 mg total) by mouth 2 (two) times daily.  180 tablet  3  . Fluticasone-Salmeterol (ADVAIR) 250-50 MCG/DOSE AEPB Inhale 1 puff into the lungs every 12 (twelve) hours.      . furosemide (LASIX) 40 MG tablet Take 1 tablet (40 mg total) by mouth daily.  30 tablet  6  . ipratropium-albuterol (DUONEB) 0.5-2.5 (3) MG/3ML SOLN Take 3 mLs by nebulization every 6 (six) hours as needed (Shortness of  breath).      . losartan (COZAAR) 100 MG tablet Take 0.5 tablets (50 mg total) by mouth daily.  30 tablet  6  . Multiple Vitamins-Minerals (MENS MULTIVITAMIN PLUS) TABS Take 1 tablet by mouth daily.       Marland Kitchen omeprazole (PRILOSEC) 20 MG capsule Take 20 mg by mouth 2 (two) times daily.       . potassium chloride (K-DUR) 10 MEQ tablet Take 2 tablets (20 mEq total) by mouth daily.  60 tablet  6  . prasugrel (EFFIENT) 10 MG TABS Take 10 mg by mouth daily.      . rosuvastatin (CRESTOR) 10 MG tablet Take 10 mg by mouth daily.      Marland Kitchen warfarin (COUMADIN) 2.5 MG tablet Take 2.5 mg by mouth daily.      . nitroGLYCERIN (NITROSTAT) 0.4 MG SL tablet Place 1 tablet (0.4 mg total) under the tongue every 5 (five) minutes as needed for chest pain.  90 tablet  3   Assessment: 57yo male on chronic Coumadin due to h/o right ventricular thrombus.  Pt's home dose is 2.5mg  daily.  INR is now therapeutic.  Goal of Therapy:  INR 2-3   Plan:  Coumadin 2.5mg  today. INR daily  Mady Gemma 10/25/2012,8:46  AM

## 2012-10-25 NOTE — Progress Notes (Signed)
Danny [name], Danny [name]                ACCOUNT NO.:  [id number]  MEDICAL RECORD NO.:  [id number]  LOCATION:                                 FACILITY:  PHYSICIAN:  Taneika Choi L. Juanetta Gosling, M.D.DATE OF BIRTH:  11-Mar-1956  DATE OF PROCEDURE: DATE OF DISCHARGE:                                PROGRESS NOTE   Patient of Dr. Janna Arch.  [name] had more trouble with hypoxia yesterday.  He is feeling a little better this morning.  He has no other new complaints.  PHYSICAL EXAMINATION:  VITAL SIGNS:  He is on an oxygen mask. Temperature is 98.6, pulse 105, respirations 18, blood pressure 141/99, O2 sats 94%. GENERAL:  He looks relatively comfortable, despite the problems with his oxygenation. HEENT:  His mucous membranes are moist. LUNGS:  His chest is with decreased breath sounds and some rhonchi. HEART:  Regular without gallop. ABDOMEN:  Soft. EXTREMITIES:  No edema.  ASSESSMENT:  He has severe chronic obstructive pulmonary disease.  He has pneumonia.  He has a clot in his ventricle and is hypoxic.  I think he needs to continue on current treatments.  I did not change anything today.  I encouraged him to keep trying to cough sputum up.     Harbour Nordmeyer L. Juanetta Gosling, M.D.     ELH/MEDQ  D:  10/25/2012  T:  10/25/2012  Job:  [id number]

## 2012-10-25 NOTE — Progress Notes (Signed)
Danny [name], Danny [name]                ACCOUNT NO.:  [id number]  MEDICAL RECORD NO.:  [id number]  LOCATION:  A218                          FACILITY:  [facility]  PHYSICIAN:  Melvyn Novas, MDDATE OF BIRTH:  November 11, 1955  DATE OF PROCEDURE: DATE OF DISCHARGE:                                PROGRESS NOTE   The patient has right lower lobe infiltrate, left upper lung nodule currently on Rocephin and Zithromax, and bronchodilator, nebulizer, doing well.  The patient currently anticoagulated for right ventricular thrombus, status post MI several months ago with right ventricular dysfunction, right atrial enlargement.  The patient had some dyspnea, was on home O2 at night.  Currently has no complaints of dyspnea in the hospital.  He does have cough, no sputum production.  Rocephin and Zithromax seemed to be appropriate, seen in consultation by Pulmonology. They agree with therapy at present.  CT angio of chest was negative. Blood pressure is 141/99, pulse 100 and regular, respiratory rate is 18, temperature 98.6.  Lungs show scattered rhonchi.  No rales, no wheeze appreciable.  Heart, regular rhythm.  No S3, S4, gallops appreciable. No heaves, thrills, or rubs.  Right arm shows extravasation of saline fluid in the right forearm.  No tenderness, no evidence of DVT.  I am not sure why blood pressure is elevated.  Plan right now is continue Coumadin, continue dual antibiotics and nebulizer therapy.  Monitor blood pressure and treat as appropriate.     Melvyn Novas, MD     RMD/MEDQ  D:  10/25/2012  T:  10/25/2012  Job:  [id number]

## 2012-10-26 LAB — PROTIME-INR
INR: 2.23 — ABNORMAL HIGH (ref 0.00–1.49)
Prothrombin Time: 24 seconds — ABNORMAL HIGH (ref 11.6–15.2)

## 2012-10-26 LAB — BASIC METABOLIC PANEL
CO2: 38 mEq/L — ABNORMAL HIGH (ref 19–32)
Calcium: 8.9 mg/dL (ref 8.4–10.5)
Creatinine, Ser: 1.14 mg/dL (ref 0.50–1.35)
GFR calc Af Amer: 82 mL/min — ABNORMAL LOW (ref >90)
GFR calc non Af Amer: 71 mL/min — ABNORMAL LOW (ref >90)
Sodium: 143 mEq/L (ref 135–145)

## 2012-10-26 LAB — BLOOD GAS, ARTERIAL
Bicarbonate: 36.8 mEq/L — ABNORMAL HIGH (ref 20.0–24.0)
O2 Saturation: 87.2 %
Patient temperature: 37
TCO2: 32.3 mmol/L (ref 0–100)

## 2012-10-26 MED ORDER — LOSARTAN POTASSIUM 50 MG PO TABS
100.0000 mg | ORAL_TABLET | Freq: Every day | ORAL | Status: DC
  Administered 2012-10-26 – 2012-11-01 (×7): 100 mg via ORAL
  Filled 2012-10-26 (×7): qty 2

## 2012-10-26 MED ORDER — ALBUTEROL SULFATE (5 MG/ML) 0.5% IN NEBU
2.5000 mg | INHALATION_SOLUTION | Freq: Three times a day (TID) | RESPIRATORY_TRACT | Status: DC
  Administered 2012-10-26 – 2012-11-01 (×16): 2.5 mg via RESPIRATORY_TRACT
  Filled 2012-10-26 (×17): qty 0.5

## 2012-10-26 MED ORDER — IPRATROPIUM BROMIDE 0.02 % IN SOLN
0.5000 mg | Freq: Three times a day (TID) | RESPIRATORY_TRACT | Status: DC
  Administered 2012-10-26 – 2012-11-01 (×16): 0.5 mg via RESPIRATORY_TRACT
  Filled 2012-10-26 (×17): qty 2.5

## 2012-10-26 MED ORDER — IPRATROPIUM BROMIDE 0.02 % IN SOLN
0.5000 mg | RESPIRATORY_TRACT | Status: DC | PRN
  Administered 2012-10-31: 0.5 mg via RESPIRATORY_TRACT

## 2012-10-26 MED ORDER — ALBUTEROL SULFATE (5 MG/ML) 0.5% IN NEBU
2.5000 mg | INHALATION_SOLUTION | RESPIRATORY_TRACT | Status: DC | PRN
  Administered 2012-10-31: 2.5 mg via RESPIRATORY_TRACT

## 2012-10-26 MED ORDER — WARFARIN SODIUM 2.5 MG PO TABS
2.5000 mg | ORAL_TABLET | Freq: Once | ORAL | Status: AC
  Administered 2012-10-26: 2.5 mg via ORAL
  Filled 2012-10-26: qty 1

## 2012-10-26 NOTE — Progress Notes (Signed)
ANTICOAGULATION CONSULT NOTE   Pharmacy Consult for Coumadin Indication: right ventricular thrombus  Allergies  Allergen Reactions  . Lipitor (Atorvastatin) Other (See Comments)    Muscle spasms    Patient Measurements: Height: 5\' 7"  (170.2 cm) Weight: 165 lb 9.1 oz (75.1 kg) IBW/kg (Calculated) : 66.1  Vital Signs: Temp: 98.3 F (36.8 C) (07/26 0500) Temp src: Oral (07/26 0500) BP: 144/98 mmHg (07/26 0627) Pulse Rate: 95 (07/26 0547)  Labs:  Recent Labs  10/23/12 1055 10/23/12 1615 10/24/12 0504 10/25/12 0455 10/26/12 0522  HGB 15.5  --  14.8  --   --   HCT 46.3  --  45.0  --   --   PLT 190  --  208  --   --   LABPROT 19.9*  --  20.6* 23.4* 24.0*  INR 1.75*  --  1.83* 2.16* 2.23*  CREATININE 1.18  --  1.25 1.20 1.14  TROPONINI <0.30 <0.30  --   --   --    Estimated Creatinine Clearance: 68.5 ml/min (by C-G formula based on Cr of 1.14).  Medical History: Past Medical History  Diagnosis Date  . GERD (gastroesophageal reflux disease)   . COPD (chronic obstructive pulmonary disease)   . Myocardial infarction 05/02/12  . CHF (congestive heart failure)   . Hypertension    Medications:  Prescriptions prior to admission  Medication Sig Dispense Refill  . acetaminophen (TYLENOL) 325 MG tablet Take 650 mg by mouth every 6 (six) hours as needed for pain.       Marland Kitchen albuterol (PROVENTIL HFA;VENTOLIN HFA) 108 (90 BASE) MCG/ACT inhaler Inhale 2 puffs into the lungs every 6 (six) hours as needed for wheezing.      Marland Kitchen aspirin EC 81 MG tablet Take 81 mg by mouth daily.      . carvedilol (COREG) 6.25 MG tablet Take 1 tablet (6.25 mg total) by mouth 2 (two) times daily.  180 tablet  3  . Fluticasone-Salmeterol (ADVAIR) 250-50 MCG/DOSE AEPB Inhale 1 puff into the lungs every 12 (twelve) hours.      . furosemide (LASIX) 40 MG tablet Take 1 tablet (40 mg total) by mouth daily.  30 tablet  6  . ipratropium-albuterol (DUONEB) 0.5-2.5 (3) MG/3ML SOLN Take 3 mLs by nebulization every 6  (six) hours as needed (Shortness of breath).      . losartan (COZAAR) 100 MG tablet Take 0.5 tablets (50 mg total) by mouth daily.  30 tablet  6  . Multiple Vitamins-Minerals (MENS MULTIVITAMIN PLUS) TABS Take 1 tablet by mouth daily.       Marland Kitchen omeprazole (PRILOSEC) 20 MG capsule Take 20 mg by mouth 2 (two) times daily.       . potassium chloride (K-DUR) 10 MEQ tablet Take 2 tablets (20 mEq total) by mouth daily.  60 tablet  6  . prasugrel (EFFIENT) 10 MG TABS Take 10 mg by mouth daily.      . rosuvastatin (CRESTOR) 10 MG tablet Take 10 mg by mouth daily.      Marland Kitchen warfarin (COUMADIN) 2.5 MG tablet Take 2.5 mg by mouth daily.      . nitroGLYCERIN (NITROSTAT) 0.4 MG SL tablet Place 1 tablet (0.4 mg total) under the tongue every 5 (five) minutes as needed for chest pain.  90 tablet  3   Assessment: 57yo male on chronic Coumadin due to h/o right ventricular thrombus.  Pt's home dose is 2.5mg  daily (recent dose decrease on 7/21).  INR was sub-therapeutic on admission,  but has risen to goal after Coumadin 5mg  po x2 doses.   No bleeding noted.   Plan to keep INR at low end of goal range since he is on triple anti-platelet therapy (asa, prasugrel, & warfarin  Goal of Therapy:  INR 2-3   Plan:  Coumadin 2.5mg  today. INR daily  Elson Clan 10/26/2012,9:15 AM

## 2012-10-26 NOTE — Progress Notes (Signed)
[id number] 

## 2012-10-26 NOTE — Progress Notes (Signed)
NAMEKEYMARION, BEARMAN NO.:  [id number]  MEDICAL RECORD NO.:  [id number]  LOCATION:  A218                          FACILITY:  [facility]  PHYSICIAN:  Melvyn Novas, MDDATE OF BIRTH:  [birth year]  DATE OF PROCEDURE: DATE OF DISCHARGE:                                PROGRESS NOTE   The patient has pneumonitis on dual antibiotics, Rocephin and Zithromax. Nebulizers were increased to q.i.d.  The patient had some dyspnea associated with hypoxia.  He has right ventricular thrombus, status post MI several months ago with stenting currently anticoagulated on Coumadin.  INR 2.24, creatinine 1.14, blood pressure 144/98, temperature 98.3, pulse 95 and regular, respiratory rate is 20.  Lungs are clear. No rales, wheeze or rhonchi.  Heart regular rhythm, no murmurs, gallops, or rubs.  Abdomen is soft, nontender.  Bowel sounds normoactive.  No guarding or rebound.  PLAN:  Right now is to increase Cozaar from 50-100 today for optimal blood pressure control and monitor renal function in a.m.     Melvyn Novas, MD     RMD/MEDQ  D:  10/26/2012  T:  10/26/2012  Job:  [id number]

## 2012-10-26 NOTE — Progress Notes (Signed)
Subjective: He says he feels better. He has no new complaints. His breathing is better and he is back on nasal cannula.  Objective: Vital signs in last 24 hours: Temp:  [97.7 F (36.5 C)-98.3 F (36.8 C)] 98.3 F (36.8 C) (07/26 0500) Pulse Rate:  [95-102] 95 (07/26 0547) Resp:  [17-20] 20 (07/26 0500) BP: (136-151)/(95-111) 144/98 mmHg (07/26 0627) SpO2:  [91 %-95 %] 95 % (07/26 0713) FiO2 (%):  [50 %] 50 % (07/26 0500) Weight change:  Last BM Date: 10/25/12 (per pt.)  Intake/Output from previous day: 07/25 0701 - 07/26 0700 In: 2030 [P.O.:1200; I.V.:480; IV Piggyback:350] Out: -   PHYSICAL EXAM General appearance: alert, cooperative and no distress Resp: rhonchi bilaterally Cardio: regular rate and rhythm, S1, S2 normal, no murmur, click, rub or gallop GI: soft, non-tender; bowel sounds normal; no masses,  no organomegaly Extremities: extremities normal, atraumatic, no cyanosis or edema  Lab Results:    Basic Metabolic Panel:  Recent Labs  82/95/62 0455 10/26/12 0522  NA 142 143  K 3.9 4.0  CL 101 101  CO2 36* 38*  GLUCOSE 93 100*  BUN 15 12  CREATININE 1.20 1.14  CALCIUM 8.9 8.9   Liver Function Tests:  Recent Labs  10/24/12 0504  AST 19  ALT 18  ALKPHOS 70  BILITOT 0.6  PROT 6.6  ALBUMIN 2.8*   No results found for this basename: LIPASE, AMYLASE,  in the last 72 hours No results found for this basename: AMMONIA,  in the last 72 hours CBC:  Recent Labs  10/23/12 1055 10/24/12 0504  WBC 5.8 7.0  NEUTROABS 3.2  --   HGB 15.5 14.8  HCT 46.3 45.0  MCV 99.4 100.0  PLT 190 208   Cardiac Enzymes:  Recent Labs  10/23/12 1055 10/23/12 1615  TROPONINI <0.30 <0.30   BNP:  Recent Labs  10/23/12 1055  PROBNP 2088.0*   D-Dimer: No results found for this basename: DDIMER,  in the last 72 hours CBG: No results found for this basename: GLUCAP,  in the last 72 hours Hemoglobin A1C: No results found for this basename: HGBA1C,  in the last  72 hours Fasting Lipid Panel: No results found for this basename: CHOL, HDL, LDLCALC, TRIG, CHOLHDL, LDLDIRECT,  in the last 72 hours Thyroid Function Tests: No results found for this basename: TSH, T4TOTAL, FREET4, T3FREE, THYROIDAB,  in the last 72 hours Anemia Panel: No results found for this basename: VITAMINB12, FOLATE, FERRITIN, TIBC, IRON, RETICCTPCT,  in the last 72 hours Coagulation:  Recent Labs  10/25/12 0455 10/26/12 0522  LABPROT 23.4* 24.0*  INR 2.16* 2.23*   Urine Drug Screen: Drugs of Abuse  No results found for this basename: labopia, cocainscrnur, labbenz, amphetmu, thcu, labbarb    Alcohol Level: No results found for this basename: ETH,  in the last 72 hours Urinalysis: No results found for this basename: COLORURINE, APPERANCEUR, LABSPEC, PHURINE, GLUCOSEU, HGBUR, BILIRUBINUR, KETONESUR, PROTEINUR, UROBILINOGEN, NITRITE, LEUKOCYTESUR,  in the last 72 hours Misc. Labs:  ABGS No results found for this basename: PHART, PCO2, PO2ART, TCO2, HCO3,  in the last 72 hours CULTURES Recent Results (from the past 240 hour(s))  CULTURE, BLOOD (ROUTINE X 2)     Status: None   Collection Time    10/23/12  4:58 PM      Result Value Range Status   Specimen Description BLOOD LEFT ANTECUBITAL   Final   Special Requests BOTTLES DRAWN AEROBIC AND ANAEROBIC 8CC   Final  Culture NO GROWTH 3 DAYS   Final   Report Status PENDING   Incomplete  CULTURE, BLOOD (ROUTINE X 2)     Status: None   Collection Time    10/23/12  5:09 PM      Result Value Range Status   Specimen Description BLOOD LEFT HAND   Final   Special Requests     Final   Value: BOTTLES DRAWN AEROBIC AND ANAEROBIC AEB=12CC ANA=10CC   Culture NO GROWTH 3 DAYS   Final   Report Status PENDING   Incomplete  CULTURE, EXPECTORATED SPUTUM-ASSESSMENT     Status: None   Collection Time    10/24/12 10:35 PM      Result Value Range Status   Specimen Description SPUTUM   Final   Special Requests Normal   Final   Sputum  evaluation     Final   Value: THIS SPECIMEN IS ACCEPTABLE. RESPIRATORY CULTURE REPORT TO FOLLOW.   Report Status 10/25/2012 FINAL   Final   Studies/Results: No results found.  Medications:  Prior to Admission:  Prescriptions prior to admission  Medication Sig Dispense Refill  . acetaminophen (TYLENOL) 325 MG tablet Take 650 mg by mouth every 6 (six) hours as needed for pain.       Marland Kitchen albuterol (PROVENTIL HFA;VENTOLIN HFA) 108 (90 BASE) MCG/ACT inhaler Inhale 2 puffs into the lungs every 6 (six) hours as needed for wheezing.      Marland Kitchen aspirin EC 81 MG tablet Take 81 mg by mouth daily.      . carvedilol (COREG) 6.25 MG tablet Take 1 tablet (6.25 mg total) by mouth 2 (two) times daily.  180 tablet  3  . Fluticasone-Salmeterol (ADVAIR) 250-50 MCG/DOSE AEPB Inhale 1 puff into the lungs every 12 (twelve) hours.      . furosemide (LASIX) 40 MG tablet Take 1 tablet (40 mg total) by mouth daily.  30 tablet  6  . ipratropium-albuterol (DUONEB) 0.5-2.5 (3) MG/3ML SOLN Take 3 mLs by nebulization every 6 (six) hours as needed (Shortness of breath).      . losartan (COZAAR) 100 MG tablet Take 0.5 tablets (50 mg total) by mouth daily.  30 tablet  6  . Multiple Vitamins-Minerals (MENS MULTIVITAMIN PLUS) TABS Take 1 tablet by mouth daily.       Marland Kitchen omeprazole (PRILOSEC) 20 MG capsule Take 20 mg by mouth 2 (two) times daily.       . potassium chloride (K-DUR) 10 MEQ tablet Take 2 tablets (20 mEq total) by mouth daily.  60 tablet  6  . prasugrel (EFFIENT) 10 MG TABS Take 10 mg by mouth daily.      . rosuvastatin (CRESTOR) 10 MG tablet Take 10 mg by mouth daily.      Marland Kitchen warfarin (COUMADIN) 2.5 MG tablet Take 2.5 mg by mouth daily.      . nitroGLYCERIN (NITROSTAT) 0.4 MG SL tablet Place 1 tablet (0.4 mg total) under the tongue every 5 (five) minutes as needed for chest pain.  90 tablet  3   Scheduled: . aspirin EC  81 mg Oral Daily  . azithromycin  500 mg Intravenous Q24H  . carvedilol  6.25 mg Oral BID WC  .  cefTRIAXone (ROCEPHIN)  IV  1 g Intravenous Q24H  . furosemide  40 mg Oral Daily  . losartan  100 mg Oral Daily  . mometasone-formoterol  2 puff Inhalation BID  . pantoprazole  40 mg Oral BID  . potassium chloride  20 mEq  Oral QHS  . prasugrel  10 mg Oral Daily  . warfarin  2.5 mg Oral ONCE-1800  . Warfarin - Pharmacist Dosing Inpatient   Does not apply Q24H   Continuous: . sodium chloride 20 mL/hr (10/25/12 2118)   QMV:[id number], albuterol, ipratropium, LORazepam, nitroGLYCERIN  Assesment: He has community-acquired pneumonia. He has COPD and has had respiratory failure with hypoxia. He has multiple other medical problems including coronary artery occlusive disease, right ventricular mural thrombus, long-term anticoagulant use and hypertension. He has what seems to be some right ventricular failure Active Problems:   COPD   CAD (coronary artery disease)   RV (right ventricular) mural thrombus   Long term (current) use of anticoagulants   Community acquired pneumonia   HTN (hypertension)    Plan: No change he does seem to be improving.    LOS: 3 days   Angline Schweigert L 10/26/2012, 9:23 AM

## 2012-10-27 LAB — BASIC METABOLIC PANEL
BUN: 13 mg/dL (ref 6–23)
Calcium: 9 mg/dL (ref 8.4–10.5)
Chloride: 98 mEq/L (ref 96–112)
Creatinine, Ser: 1.14 mg/dL (ref 0.50–1.35)
GFR calc Af Amer: 82 mL/min — ABNORMAL LOW (ref >90)

## 2012-10-27 LAB — CBC
HCT: 44.3 % (ref 39.0–52.0)
MCHC: 32.7 g/dL (ref 30.0–36.0)
MCV: 99.8 fL (ref 78.0–100.0)
Platelets: 213 10*3/uL (ref 150–400)
RDW: 12.8 % (ref 11.5–15.5)

## 2012-10-27 LAB — CULTURE, RESPIRATORY W GRAM STAIN: Culture: NORMAL

## 2012-10-27 LAB — PROTIME-INR: Prothrombin Time: 22.4 seconds — ABNORMAL HIGH (ref 11.6–15.2)

## 2012-10-27 MED ORDER — AZITHROMYCIN 250 MG PO TABS
500.0000 mg | ORAL_TABLET | Freq: Every day | ORAL | Status: AC
  Administered 2012-10-27 – 2012-10-31 (×5): 500 mg via ORAL
  Filled 2012-10-27 (×5): qty 2

## 2012-10-27 MED ORDER — WARFARIN SODIUM 5 MG PO TABS
5.0000 mg | ORAL_TABLET | Freq: Once | ORAL | Status: AC
  Administered 2012-10-27: 5 mg via ORAL
  Filled 2012-10-27: qty 1

## 2012-10-27 NOTE — Progress Notes (Signed)
[id number] 

## 2012-10-27 NOTE — Progress Notes (Signed)
NAMEGER, RINGENBERG                ACCOUNT NO.:  [id number]  MEDICAL RECORD NO.:  [id number]  LOCATION:  A218                          FACILITY:  [facility]  PHYSICIAN:  Melvyn Novas, MDDATE OF BIRTH:  1955-10-19  DATE OF PROCEDURE: DATE OF DISCHARGE:                                PROGRESS NOTE   The patient was admitted with right lower lobe infiltrate, pneumonia, coughing with some hypoxia associated with COPD, has right-sided atrial and ventricular dilatation, right ventricular thrombus after MI, stent several months ago.  Currently anticoagulated.  Currently on Rocephin, Zithromax, and increased frequency of nebulizer therapy 4 times a day. Blood pressure 124/89, pulse is 105 and regular, temperature 97.9, respiratory rate is 20, O2 sat is 98%.  Potassium 4.0, creatinine 1.14. Lungs are clear.  Lungs show scattered rhonchi.  Prolonged expiratory phase.  No rales, wheeze, or rhonchi.  No rales or wheeze appreciable. Heart regular rhythm.  No S3, S4, gallops appreciable.  No heaves, thrills, or rubs.  Abdomen is soft, nontender.  Bowel sounds normoactive.  No guarding, rebound.  No masses.  No megaly.  Continue current therapy, Rocephin and Zithromax.  We will decide an outpatient therapy in a 24-48 hour.  The patient continues to improve.     Melvyn Novas, MD     RMD/MEDQ  D:  10/27/2012  T:  10/27/2012  Job:  [id number]

## 2012-10-27 NOTE — Progress Notes (Addendum)
ANTICOAGULATION CONSULT NOTE   Pharmacy Consult for Coumadin Indication: right ventricular thrombus  Allergies  Allergen Reactions  . Lipitor (Atorvastatin) Other (See Comments)    Muscle spasms    Patient Measurements: Height: 5\' 7"  (170.2 cm) Weight: 167 lb 1.7 oz (75.8 kg) IBW/kg (Calculated) : 66.1  Vital Signs: Temp: 97.9 F (36.6 C) (07/27 0539) Temp src: Oral (07/27 0539) BP: 125/89 mmHg (07/27 0539) Pulse Rate: 105 (07/27 0539)  Labs:  Recent Labs  10/25/12 0455 10/26/12 0522 10/27/12 0550  HGB  --   --  14.5  HCT  --   --  44.3  PLT  --   --  213  LABPROT 23.4* 24.0* 22.4*  INR 2.16* 2.23* 2.04*  CREATININE 1.20 1.14 1.14   Estimated Creatinine Clearance: 68.5 ml/min (by C-G formula based on Cr of 1.14).  Medical History: Past Medical History  Diagnosis Date  . GERD (gastroesophageal reflux disease)   . COPD (chronic obstructive pulmonary disease)   . Myocardial infarction 05/02/12  . CHF (congestive heart failure)   . Hypertension    Medications:  Prescriptions prior to admission  Medication Sig Dispense Refill  . acetaminophen (TYLENOL) 325 MG tablet Take 650 mg by mouth every 6 (six) hours as needed for pain.       Marland Kitchen albuterol (PROVENTIL HFA;VENTOLIN HFA) 108 (90 BASE) MCG/ACT inhaler Inhale 2 puffs into the lungs every 6 (six) hours as needed for wheezing.      Marland Kitchen aspirin EC 81 MG tablet Take 81 mg by mouth daily.      . carvedilol (COREG) 6.25 MG tablet Take 1 tablet (6.25 mg total) by mouth 2 (two) times daily.  180 tablet  3  . Fluticasone-Salmeterol (ADVAIR) 250-50 MCG/DOSE AEPB Inhale 1 puff into the lungs every 12 (twelve) hours.      . furosemide (LASIX) 40 MG tablet Take 1 tablet (40 mg total) by mouth daily.  30 tablet  6  . ipratropium-albuterol (DUONEB) 0.5-2.5 (3) MG/3ML SOLN Take 3 mLs by nebulization every 6 (six) hours as needed (Shortness of breath).      . losartan (COZAAR) 100 MG tablet Take 0.5 tablets (50 mg total) by mouth  daily.  30 tablet  6  . Multiple Vitamins-Minerals (MENS MULTIVITAMIN PLUS) TABS Take 1 tablet by mouth daily.       Marland Kitchen omeprazole (PRILOSEC) 20 MG capsule Take 20 mg by mouth 2 (two) times daily.       . potassium chloride (K-DUR) 10 MEQ tablet Take 2 tablets (20 mEq total) by mouth daily.  60 tablet  6  . prasugrel (EFFIENT) 10 MG TABS Take 10 mg by mouth daily.      . rosuvastatin (CRESTOR) 10 MG tablet Take 10 mg by mouth daily.      Marland Kitchen warfarin (COUMADIN) 2.5 MG tablet Take 2.5 mg by mouth daily.      . nitroGLYCERIN (NITROSTAT) 0.4 MG SL tablet Place 1 tablet (0.4 mg total) under the tongue every 5 (five) minutes as needed for chest pain.  90 tablet  3   Assessment: 57yo male recently started on Coumadin due to right ventricular thrombus.  Pt's home dose is 2.5mg  daily (recent dose decrease on 7/21).  INR was sub-therapeutic on admission, but has risen to goal after Coumadin 5mg  po x2 doses.   No bleeding noted.   Plan to keep INR at low end of goal range since he is on triple anti-platelet therapy (asa, prasugrel, & warfarin).  Goal  of Therapy:  INR 2-3   Plan:  Coumadin 5mg  today. INR daily Change Zithromax to po per P&T policy (see below)  Danny [name], Danny [name] 10/27/2012,10:00 AM  PHARMACIST - PHYSICIAN COMMUNICATION CONCERNING: Antibiotic IV to Oral Route Change Policy  RECOMMENDATION: This patient is receiving Zithromax by the intravenous route.  Based on criteria approved by the Pharmacy and Therapeutics Committee, the antibiotic(s) is/are being converted to the equivalent oral dose form(s).   DESCRIPTION: These criteria include:  Patient being treated for a respiratory tract infection, urinary tract infection, cellulitis or clostridium difficile associated diarrhea if on metronidazole  The patient is not neutropenic and does not exhibit a GI malabsorption state  The patient is eating (either orally or via tube) and/or has been taking other orally administered  medications for a least 24 hours  The patient is improving clinically and has a Tmax < 100.5  If you have questions about this conversion, please contact the Pharmacy Department  [x]   [phone] )  Danny [name] []   [phone] )  Danny [name]  []   [phone] )  Danny [name] []   [phone] )  Danny [name]

## 2012-10-28 ENCOUNTER — Ambulatory Visit: Payer: Managed Care, Other (non HMO) | Admitting: Adult Health

## 2012-10-28 LAB — BLOOD GAS, ARTERIAL
Drawn by: 27407
FIO2: 0.5 %
pCO2 arterial: 69 mmHg (ref 35.0–45.0)
pO2, Arterial: 65.6 mmHg — ABNORMAL LOW (ref 80.0–100.0)

## 2012-10-28 MED ORDER — METHYLPREDNISOLONE SODIUM SUCC 40 MG IJ SOLR
40.0000 mg | Freq: Four times a day (QID) | INTRAMUSCULAR | Status: DC
  Administered 2012-10-28 – 2012-10-30 (×9): 40 mg via INTRAVENOUS
  Filled 2012-10-28 (×9): qty 1

## 2012-10-28 MED ORDER — WARFARIN SODIUM 2.5 MG PO TABS
2.5000 mg | ORAL_TABLET | Freq: Once | ORAL | Status: AC
  Administered 2012-10-28: 2.5 mg via ORAL
  Filled 2012-10-28: qty 1

## 2012-10-28 MED ORDER — GUAIFENESIN ER 600 MG PO TB12
1200.0000 mg | ORAL_TABLET | Freq: Two times a day (BID) | ORAL | Status: DC
  Administered 2012-10-28 – 2012-11-01 (×9): 1200 mg via ORAL
  Filled 2012-10-28 (×9): qty 2

## 2012-10-28 NOTE — Progress Notes (Signed)
Danny [name], Danny [name]                ACCOUNT NO.:  [id number]  MEDICAL RECORD NO.:  [id number]  LOCATION:  A218                          FACILITY:  [facility]  PHYSICIAN:  Melvyn Novas, MDDATE OF BIRTH:  1955/08/04  DATE OF PROCEDURE: DATE OF DISCHARGE:                                PROGRESS NOTE   The patient is status post MI several months ago, right ventricular thrombus, right ventricular and atrial dilatation with hypokinesis, status post stenting, hypertension, hyperlipidemia, COPD.  The patient has right lower lobe infiltrate and left upper lobe nodule.  Blood gases this morning revealed pH of 7.34, pCO2 of 69, and pO2 of 65 with an O2 sat of 88%.  The patient is retaining CO2 and hypoxic, currently on Rocephin and Zithromax, and nebulizer therapy q.4h.  Other than underlying lung disease, not certain why the patient is retaining CO2 and somewhat hypoxic.  We will continue to treat pneumonia and see if this resolves.  The patient is currently on 50% face mask.  Lungs show diminished breath sounds at the bases, prolonged expiratory phase.  No rales, no wheeze appreciable.  Heart:  Regular rhythm.  No murmurs, gallops, or rubs.     Melvyn Novas, MD     RMD/MEDQ  D:  10/28/2012  T:  10/28/2012  Job:  [id number]

## 2012-10-28 NOTE — Progress Notes (Signed)
ANTICOAGULATION CONSULT NOTE   Pharmacy Consult for Coumadin Indication: right ventricular thrombus  Allergies  Allergen Reactions  . Lipitor (Atorvastatin) Other (See Comments)    Muscle spasms    Patient Measurements: Height: 5\' 7"  (170.2 cm) Weight: 167 lb 1.7 oz (75.8 kg) IBW/kg (Calculated) : 66.1  Vital Signs: Temp: 99.1 F (37.3 C) (07/28 0426) Temp src: Oral (07/28 0426) BP: 153/76 mmHg (07/28 0426) Pulse Rate: 104 (07/28 0426)  Labs:  Recent Labs  10/26/12 0522 10/27/12 0550 10/28/12 0512  HGB  --  14.5  --   HCT  --  44.3  --   PLT  --  213  --   LABPROT 24.0* 22.4* 22.9*  INR 2.23* 2.04* 2.10*  CREATININE 1.14 1.14  --    Estimated Creatinine Clearance: 68.5 ml/min (by C-G formula based on Cr of 1.14).  Medical History: Past Medical History  Diagnosis Date  . GERD (gastroesophageal reflux disease)   . COPD (chronic obstructive pulmonary disease)   . Myocardial infarction 05/02/12  . CHF (congestive heart failure)   . Hypertension    Medications:  Prescriptions prior to admission  Medication Sig Dispense Refill  . acetaminophen (TYLENOL) 325 MG tablet Take 650 mg by mouth every 6 (six) hours as needed for pain.       Marland Kitchen albuterol (PROVENTIL HFA;VENTOLIN HFA) 108 (90 BASE) MCG/ACT inhaler Inhale 2 puffs into the lungs every 6 (six) hours as needed for wheezing.      Marland Kitchen aspirin EC 81 MG tablet Take 81 mg by mouth daily.      . carvedilol (COREG) 6.25 MG tablet Take 1 tablet (6.25 mg total) by mouth 2 (two) times daily.  180 tablet  3  . Fluticasone-Salmeterol (ADVAIR) 250-50 MCG/DOSE AEPB Inhale 1 puff into the lungs every 12 (twelve) hours.      . furosemide (LASIX) 40 MG tablet Take 1 tablet (40 mg total) by mouth daily.  30 tablet  6  . ipratropium-albuterol (DUONEB) 0.5-2.5 (3) MG/3ML SOLN Take 3 mLs by nebulization every 6 (six) hours as needed (Shortness of breath).      . losartan (COZAAR) 100 MG tablet Take 0.5 tablets (50 mg total) by mouth  daily.  30 tablet  6  . Multiple Vitamins-Minerals (MENS MULTIVITAMIN PLUS) TABS Take 1 tablet by mouth daily.       Marland Kitchen omeprazole (PRILOSEC) 20 MG capsule Take 20 mg by mouth 2 (two) times daily.       . potassium chloride (K-DUR) 10 MEQ tablet Take 2 tablets (20 mEq total) by mouth daily.  60 tablet  6  . prasugrel (EFFIENT) 10 MG TABS Take 10 mg by mouth daily.      . rosuvastatin (CRESTOR) 10 MG tablet Take 10 mg by mouth daily.      Marland Kitchen warfarin (COUMADIN) 2.5 MG tablet Take 2.5 mg by mouth daily.      . nitroGLYCERIN (NITROSTAT) 0.4 MG SL tablet Place 1 tablet (0.4 mg total) under the tongue every 5 (five) minutes as needed for chest pain.  90 tablet  3   Assessment: 57yo male recently started on Coumadin due to right ventricular thrombus.  Pt's home dose is 2.5mg  daily (recent dose decrease on 7/21).  INR was sub-therapeutic on admission, but has risen with dose increase.   No bleeding noted.   Plan to keep INR at low end of goal range since he is on triple anti-platelet therapy (asa, prasugrel, & warfarin).  Goal of Therapy:  INR 2-3   Plan:  Coumadin 2.5mg  today. INR daily  Elson Clan 10/28/2012,8:14 AM

## 2012-10-28 NOTE — Progress Notes (Signed)
Respiratory care note: The Pt did not wear BIPAP. He stated that he was going to have a test to see if he needed to wear it. I asked if he remembered the name of the test that the MD wanted him to have and he did not remember.

## 2012-10-28 NOTE — Progress Notes (Signed)
CRITICAL VALUE ALERT  Critical value received:  CO2 69  Date of notification:  10/28/12  Time of notification:  0509  Critical value read back: yes  Nurse who received alert:  Foye Deer RN  MD notified (1st page):  Dr. Sudie Bailey  Time of first page:  0510  MD notified (2nd page): Dr. Sudie Bailey   Time of second page: 0604  Responding MD:  Dr. Sudie Bailey  Time MD responded: [phone] - informed with no new orders at this time.

## 2012-10-28 NOTE — Progress Notes (Signed)
Subjective: He still has problems with hypoxia. He denies any chest pain. He is coughing up some sputum which is blood-tinged.  Objective: Vital signs in last 24 hours: Temp:  [98 F (36.7 C)-99.1 F (37.3 C)] 99.1 F (37.3 C) (07/28 0426) Pulse Rate:  [102-109] 104 (07/28 0426) Resp:  [20] 20 (07/28 0426) BP: (101-153)/(72-93) 153/76 mmHg (07/28 0426) SpO2:  [88 %-92 %] 88 % (07/28 0729) FiO2 (%):  [50 %] 50 % (07/28 0729) Weight:  [75.8 kg (167 lb 1.7 oz)] 75.8 kg (167 lb 1.7 oz) (07/28 0426) Weight change: 0 kg (0 lb) Last BM Date: 10/27/12  Intake/Output from previous day: 07/27 0701 - 07/28 0700 In: 1489 [P.O.:1020; I.V.:469] Out: -   PHYSICAL EXAM General appearance: alert, cooperative and mild distress Resp: rhonchi bilaterally Cardio: regular rate and rhythm, S1, S2 normal, no murmur, click, rub or gallop GI: soft, non-tender; bowel sounds normal; no masses,  no organomegaly Extremities: extremities normal, atraumatic, no cyanosis or edema  Lab Results:    Basic Metabolic Panel:  Recent Labs  16/10/96 0522 10/27/12 0550  NA 143 139  K 4.0 4.0  CL 101 98  CO2 38* 38*  GLUCOSE 100* 101*  BUN 12 13  CREATININE 1.14 1.14  CALCIUM 8.9 9.0   Liver Function Tests: No results found for this basename: AST, ALT, ALKPHOS, BILITOT, PROT, ALBUMIN,  in the last 72 hours No results found for this basename: LIPASE, AMYLASE,  in the last 72 hours No results found for this basename: AMMONIA,  in the last 72 hours CBC:  Recent Labs  10/27/12 0550  WBC 7.8  HGB 14.5  HCT 44.3  MCV 99.8  PLT 213   Cardiac Enzymes: No results found for this basename: CKTOTAL, CKMB, CKMBINDEX, TROPONINI,  in the last 72 hours BNP: No results found for this basename: PROBNP,  in the last 72 hours D-Dimer: No results found for this basename: DDIMER,  in the last 72 hours CBG: No results found for this basename: GLUCAP,  in the last 72 hours Hemoglobin A1C: No results found for  this basename: HGBA1C,  in the last 72 hours Fasting Lipid Panel: No results found for this basename: CHOL, HDL, LDLCALC, TRIG, CHOLHDL, LDLDIRECT,  in the last 72 hours Thyroid Function Tests: No results found for this basename: TSH, T4TOTAL, FREET4, T3FREE, THYROIDAB,  in the last 72 hours Anemia Panel: No results found for this basename: VITAMINB12, FOLATE, FERRITIN, TIBC, IRON, RETICCTPCT,  in the last 72 hours Coagulation:  Recent Labs  10/27/12 0550 10/28/12 0512  LABPROT 22.4* 22.9*  INR 2.04* 2.10*   Urine Drug Screen: Drugs of Abuse  No results found for this basename: labopia, cocainscrnur, labbenz, amphetmu, thcu, labbarb    Alcohol Level: No results found for this basename: ETH,  in the last 72 hours Urinalysis: No results found for this basename: COLORURINE, APPERANCEUR, LABSPEC, PHURINE, GLUCOSEU, HGBUR, BILIRUBINUR, KETONESUR, PROTEINUR, UROBILINOGEN, NITRITE, LEUKOCYTESUR,  in the last 72 hours Misc. Labs:  ABGS  Recent Labs  10/28/12 0452  PHART 7.344*  PO2ART 65.6*  TCO2 32.7  HCO3 36.5*   CULTURES Recent Results (from the past 240 hour(s))  CULTURE, BLOOD (ROUTINE X 2)     Status: None   Collection Time    10/23/12  4:58 PM      Result Value Range Status   Specimen Description BLOOD LEFT ANTECUBITAL   Final   Special Requests BOTTLES DRAWN AEROBIC AND ANAEROBIC 8CC   Final  Culture NO GROWTH 4 DAYS   Final   Report Status PENDING   Incomplete  CULTURE, BLOOD (ROUTINE X 2)     Status: None   Collection Time    10/23/12  5:09 PM      Result Value Range Status   Specimen Description BLOOD LEFT HAND   Final   Special Requests     Final   Value: BOTTLES DRAWN AEROBIC AND ANAEROBIC AEB=12CC ANA=10CC   Culture NO GROWTH 4 DAYS   Final   Report Status PENDING   Incomplete  CULTURE, EXPECTORATED SPUTUM-ASSESSMENT     Status: None   Collection Time    10/24/12 10:35 PM      Result Value Range Status   Specimen Description SPUTUM   Final   Special  Requests Normal   Final   Sputum evaluation     Final   Value: THIS SPECIMEN IS ACCEPTABLE. RESPIRATORY CULTURE REPORT TO FOLLOW.   Report Status 10/25/2012 FINAL   Final  CULTURE, RESPIRATORY (NON-EXPECTORATED)     Status: None   Collection Time    10/24/12 10:35 PM      Result Value Range Status   Specimen Description SPUTUM   Final   Special Requests NONE   Final   Gram Stain     Final   Value: RARE WBC PRESENT, PREDOMINANTLY PMN     NO SQUAMOUS EPITHELIAL CELLS SEEN     NO ORGANISMS SEEN   Culture NORMAL OROPHARYNGEAL FLORA   Final   Report Status 10/27/2012 FINAL   Final   Studies/Results: No results found.  Medications:  Prior to Admission:  Prescriptions prior to admission  Medication Sig Dispense Refill  . acetaminophen (TYLENOL) 325 MG tablet Take 650 mg by mouth every 6 (six) hours as needed for pain.       Marland Kitchen albuterol (PROVENTIL HFA;VENTOLIN HFA) 108 (90 BASE) MCG/ACT inhaler Inhale 2 puffs into the lungs every 6 (six) hours as needed for wheezing.      Marland Kitchen aspirin EC 81 MG tablet Take 81 mg by mouth daily.      . carvedilol (COREG) 6.25 MG tablet Take 1 tablet (6.25 mg total) by mouth 2 (two) times daily.  180 tablet  3  . Fluticasone-Salmeterol (ADVAIR) 250-50 MCG/DOSE AEPB Inhale 1 puff into the lungs every 12 (twelve) hours.      . furosemide (LASIX) 40 MG tablet Take 1 tablet (40 mg total) by mouth daily.  30 tablet  6  . ipratropium-albuterol (DUONEB) 0.5-2.5 (3) MG/3ML SOLN Take 3 mLs by nebulization every 6 (six) hours as needed (Shortness of breath).      . losartan (COZAAR) 100 MG tablet Take 0.5 tablets (50 mg total) by mouth daily.  30 tablet  6  . Multiple Vitamins-Minerals (MENS MULTIVITAMIN PLUS) TABS Take 1 tablet by mouth daily.       Marland Kitchen omeprazole (PRILOSEC) 20 MG capsule Take 20 mg by mouth 2 (two) times daily.       . potassium chloride (K-DUR) 10 MEQ tablet Take 2 tablets (20 mEq total) by mouth daily.  60 tablet  6  . prasugrel (EFFIENT) 10 MG TABS  Take 10 mg by mouth daily.      . rosuvastatin (CRESTOR) 10 MG tablet Take 10 mg by mouth daily.      Marland Kitchen warfarin (COUMADIN) 2.5 MG tablet Take 2.5 mg by mouth daily.      . nitroGLYCERIN (NITROSTAT) 0.4 MG SL tablet Place 1 tablet (0.4 mg  total) under the tongue every 5 (five) minutes as needed for chest pain.  90 tablet  3   Scheduled: . albuterol  2.5 mg Nebulization TID  . aspirin EC  81 mg Oral Daily  . azithromycin  500 mg Oral Daily  . carvedilol  6.25 mg Oral BID WC  . cefTRIAXone (ROCEPHIN)  IV  1 g Intravenous Q24H  . furosemide  40 mg Oral Daily  . guaiFENesin  1,200 mg Oral BID  . ipratropium  0.5 mg Nebulization TID  . losartan  100 mg Oral Daily  . methylPREDNISolone (SOLU-MEDROL) injection  40 mg Intravenous Q6H  . mometasone-formoterol  2 puff Inhalation BID  . pantoprazole  40 mg Oral BID  . potassium chloride  20 mEq Oral QHS  . prasugrel  10 mg Oral Daily  . warfarin  2.5 mg Oral ONCE-1800  . Warfarin - Pharmacist Dosing Inpatient   Does not apply Q24H   Continuous: . sodium chloride 20 mL/hr (10/27/12 1941)   UEA:[id number], albuterol, ipratropium, LORazepam, nitroGLYCERIN  Assesment: He has COPD. He has community-acquired pneumonia. He is continuing to have congestion and hypoxia. Active Problems:   COPD   CAD (coronary artery disease)   RV (right ventricular) mural thrombus   Long term (current) use of anticoagulants   Community acquired pneumonia   HTN (hypertension)    Plan: I will have him add Mucinex. He has a flutter valve from home and I've encouraged him to use it. He will need IV steroids at least briefly    LOS: 5 days   Tae Vonada L 10/28/2012, 8:44 AM

## 2012-10-28 NOTE — Progress Notes (Signed)
UR chart review completed.  

## 2012-10-28 NOTE — Progress Notes (Signed)
[id number] 

## 2012-10-29 ENCOUNTER — Encounter (HOSPITAL_COMMUNITY): Payer: Managed Care, Other (non HMO)

## 2012-10-29 LAB — PROTIME-INR
INR: 2.38 — ABNORMAL HIGH (ref 0.00–1.49)
Prothrombin Time: 25.2 seconds — ABNORMAL HIGH (ref 11.6–15.2)

## 2012-10-29 LAB — CULTURE, BLOOD (ROUTINE X 2): Culture: NO GROWTH

## 2012-10-29 MED ORDER — WARFARIN SODIUM 2.5 MG PO TABS
2.5000 mg | ORAL_TABLET | Freq: Once | ORAL | Status: AC
  Administered 2012-10-29: 2.5 mg via ORAL
  Filled 2012-10-29: qty 1

## 2012-10-29 NOTE — Progress Notes (Signed)
Subjective: He feels better. He thinks his steroids helped. He did not wear her BiPAP but I think he is confused about what we are trying to do and I told him today that we would like for him to try BiPAP in the hospital and see if he tolerates it because he may need that at home  Objective: Vital signs in last 24 hours: Temp:  [97.7 F (36.5 C)-98.5 F (36.9 C)] 97.7 F (36.5 C) (07/29 0505) Pulse Rate:  [97-112] 108 (07/29 0505) Resp:  [19-20] 20 (07/29 0505) BP: (139-145)/(90-102) 140/90 mmHg (07/29 0505) SpO2:  [87 %-94 %] 89 % (07/29 0717) FiO2 (%):  [50 %] 50 % (07/29 0717) Weight change:  Last BM Date: 10/29/12  Intake/Output from previous day: 07/28 0701 - 07/29 0700 In: 677 [P.O.:360; I.V.:267; IV Piggyback:50] Out: -   PHYSICAL EXAM General appearance: alert, cooperative and mild distress Resp: clear to auscultation bilaterally Cardio: regular rate and rhythm, S1, S2 normal, no murmur, click, rub or gallop GI: soft, non-tender; bowel sounds normal; no masses,  no organomegaly Extremities: extremities normal, atraumatic, no cyanosis or edema  Lab Results:    Basic Metabolic Panel:  Recent Labs  16/10/96 0550  NA 139  K 4.0  CL 98  CO2 38*  GLUCOSE 101*  BUN 13  CREATININE 1.14  CALCIUM 9.0   Liver Function Tests: No results found for this basename: AST, ALT, ALKPHOS, BILITOT, PROT, ALBUMIN,  in the last 72 hours No results found for this basename: LIPASE, AMYLASE,  in the last 72 hours No results found for this basename: AMMONIA,  in the last 72 hours CBC:  Recent Labs  10/27/12 0550  WBC 7.8  HGB 14.5  HCT 44.3  MCV 99.8  PLT 213   Cardiac Enzymes: No results found for this basename: CKTOTAL, CKMB, CKMBINDEX, TROPONINI,  in the last 72 hours BNP: No results found for this basename: PROBNP,  in the last 72 hours D-Dimer: No results found for this basename: DDIMER,  in the last 72 hours CBG: No results found for this basename: GLUCAP,  in  the last 72 hours Hemoglobin A1C: No results found for this basename: HGBA1C,  in the last 72 hours Fasting Lipid Panel: No results found for this basename: CHOL, HDL, LDLCALC, TRIG, CHOLHDL, LDLDIRECT,  in the last 72 hours Thyroid Function Tests: No results found for this basename: TSH, T4TOTAL, FREET4, T3FREE, THYROIDAB,  in the last 72 hours Anemia Panel: No results found for this basename: VITAMINB12, FOLATE, FERRITIN, TIBC, IRON, RETICCTPCT,  in the last 72 hours Coagulation:  Recent Labs  10/28/12 0512 10/29/12 0449  LABPROT 22.9* 25.2*  INR 2.10* 2.38*   Urine Drug Screen: Drugs of Abuse  No results found for this basename: labopia, cocainscrnur, labbenz, amphetmu, thcu, labbarb    Alcohol Level: No results found for this basename: ETH,  in the last 72 hours Urinalysis: No results found for this basename: COLORURINE, APPERANCEUR, LABSPEC, PHURINE, GLUCOSEU, HGBUR, BILIRUBINUR, KETONESUR, PROTEINUR, UROBILINOGEN, NITRITE, LEUKOCYTESUR,  in the last 72 hours Misc. Labs:  ABGS  Recent Labs  10/28/12 0452  PHART 7.344*  PO2ART 65.6*  TCO2 32.7  HCO3 36.5*   CULTURES Recent Results (from the past 240 hour(s))  CULTURE, BLOOD (ROUTINE X 2)     Status: None   Collection Time    10/23/12  4:58 PM      Result Value Range Status   Specimen Description BLOOD LEFT ANTECUBITAL   Final   Special  Requests BOTTLES DRAWN AEROBIC AND ANAEROBIC 8CC   Final   Culture NO GROWTH 4 DAYS   Final   Report Status PENDING   Incomplete  CULTURE, BLOOD (ROUTINE X 2)     Status: None   Collection Time    10/23/12  5:09 PM      Result Value Range Status   Specimen Description BLOOD LEFT HAND   Final   Special Requests     Final   Value: BOTTLES DRAWN AEROBIC AND ANAEROBIC AEB=12CC ANA=10CC   Culture NO GROWTH 4 DAYS   Final   Report Status PENDING   Incomplete  CULTURE, EXPECTORATED SPUTUM-ASSESSMENT     Status: None   Collection Time    10/24/12 10:35 PM      Result Value Range  Status   Specimen Description SPUTUM   Final   Special Requests Normal   Final   Sputum evaluation     Final   Value: THIS SPECIMEN IS ACCEPTABLE. RESPIRATORY CULTURE REPORT TO FOLLOW.   Report Status 10/25/2012 FINAL   Final  CULTURE, RESPIRATORY (NON-EXPECTORATED)     Status: None   Collection Time    10/24/12 10:35 PM      Result Value Range Status   Specimen Description SPUTUM   Final   Special Requests NONE   Final   Gram Stain     Final   Value: RARE WBC PRESENT, PREDOMINANTLY PMN     NO SQUAMOUS EPITHELIAL CELLS SEEN     NO ORGANISMS SEEN   Culture NORMAL OROPHARYNGEAL FLORA   Final   Report Status 10/27/2012 FINAL   Final   Studies/Results: No results found.  Medications:  Prior to Admission:  Prescriptions prior to admission  Medication Sig Dispense Refill  . acetaminophen (TYLENOL) 325 MG tablet Take 650 mg by mouth every 6 (six) hours as needed for pain.       Marland Kitchen albuterol (PROVENTIL HFA;VENTOLIN HFA) 108 (90 BASE) MCG/ACT inhaler Inhale 2 puffs into the lungs every 6 (six) hours as needed for wheezing.      Marland Kitchen aspirin EC 81 MG tablet Take 81 mg by mouth daily.      . carvedilol (COREG) 6.25 MG tablet Take 1 tablet (6.25 mg total) by mouth 2 (two) times daily.  180 tablet  3  . Fluticasone-Salmeterol (ADVAIR) 250-50 MCG/DOSE AEPB Inhale 1 puff into the lungs every 12 (twelve) hours.      . furosemide (LASIX) 40 MG tablet Take 1 tablet (40 mg total) by mouth daily.  30 tablet  6  . ipratropium-albuterol (DUONEB) 0.5-2.5 (3) MG/3ML SOLN Take 3 mLs by nebulization every 6 (six) hours as needed (Shortness of breath).      . losartan (COZAAR) 100 MG tablet Take 0.5 tablets (50 mg total) by mouth daily.  30 tablet  6  . Multiple Vitamins-Minerals (MENS MULTIVITAMIN PLUS) TABS Take 1 tablet by mouth daily.       Marland Kitchen omeprazole (PRILOSEC) 20 MG capsule Take 20 mg by mouth 2 (two) times daily.       . potassium chloride (K-DUR) 10 MEQ tablet Take 2 tablets (20 mEq total) by mouth  daily.  60 tablet  6  . prasugrel (EFFIENT) 10 MG TABS Take 10 mg by mouth daily.      . rosuvastatin (CRESTOR) 10 MG tablet Take 10 mg by mouth daily.      Marland Kitchen warfarin (COUMADIN) 2.5 MG tablet Take 2.5 mg by mouth daily.      Marland Kitchen  nitroGLYCERIN (NITROSTAT) 0.4 MG SL tablet Place 1 tablet (0.4 mg total) under the tongue every 5 (five) minutes as needed for chest pain.  90 tablet  3   Scheduled: . albuterol  2.5 mg Nebulization TID  . aspirin EC  81 mg Oral Daily  . azithromycin  500 mg Oral Daily  . carvedilol  6.25 mg Oral BID WC  . cefTRIAXone (ROCEPHIN)  IV  1 g Intravenous Q24H  . furosemide  40 mg Oral Daily  . guaiFENesin  1,200 mg Oral BID  . ipratropium  0.5 mg Nebulization TID  . losartan  100 mg Oral Daily  . methylPREDNISolone (SOLU-MEDROL) injection  40 mg Intravenous Q6H  . mometasone-formoterol  2 puff Inhalation BID  . pantoprazole  40 mg Oral BID  . potassium chloride  20 mEq Oral QHS  . prasugrel  10 mg Oral Daily  . Warfarin - Pharmacist Dosing Inpatient   Does not apply Q24H   Continuous: . sodium chloride 20 mL/hr (10/27/12 1941)   ZOX:[id number], albuterol, ipratropium, LORazepam, nitroGLYCERIN  Assesment: He has COPD. He has acute on chronic respiratory failure. He has community-acquired pneumonia. He has coronary artery occlusive disease and a right ventricular mural thrombus. His PCO2 is slightly more elevated. I do think he may be a candidate for BiPAP. I do not think he has  sleep apnea. Active Problems:   COPD   CAD (coronary artery disease)   RV (right ventricular) mural thrombus   Long term (current) use of anticoagulants   Community acquired pneumonia   HTN (hypertension)    Plan: He will try wearing BiPAP and see how he tolerates    LOS: 6 days   Arrington Yohe L 10/29/2012, 8:47 AM

## 2012-10-29 NOTE — Progress Notes (Signed)
ANTICOAGULATION CONSULT NOTE   Pharmacy Consult for Coumadin Indication: right ventricular thrombus  Allergies  Allergen Reactions  . Lipitor (Atorvastatin) Other (See Comments)    Muscle spasms    Patient Measurements: Height: 5\' 7"  (170.2 cm) Weight: 167 lb 1.7 oz (75.8 kg) IBW/kg (Calculated) : 66.1  Vital Signs: Temp: 97.7 F (36.5 C) (07/29 0505) Temp src: Oral (07/28 2100) BP: 140/90 mmHg (07/29 0505) Pulse Rate: 108 (07/29 0505)  Labs:  Recent Labs  10/27/12 0550 10/28/12 0512 10/29/12 0449  HGB 14.5  --   --   HCT 44.3  --   --   PLT 213  --   --   LABPROT 22.4* 22.9* 25.2*  INR 2.04* 2.10* 2.38*  CREATININE 1.14  --   --    Estimated Creatinine Clearance: 68.5 ml/min (by C-G formula based on Cr of 1.14).  Medical History: Past Medical History  Diagnosis Date  . GERD (gastroesophageal reflux disease)   . COPD (chronic obstructive pulmonary disease)   . Myocardial infarction 05/02/12  . CHF (congestive heart failure)   . Hypertension    Medications:  Prescriptions prior to admission  Medication Sig Dispense Refill  . acetaminophen (TYLENOL) 325 MG tablet Take 650 mg by mouth every 6 (six) hours as needed for pain.       Marland Kitchen albuterol (PROVENTIL HFA;VENTOLIN HFA) 108 (90 BASE) MCG/ACT inhaler Inhale 2 puffs into the lungs every 6 (six) hours as needed for wheezing.      Marland Kitchen aspirin EC 81 MG tablet Take 81 mg by mouth daily.      . carvedilol (COREG) 6.25 MG tablet Take 1 tablet (6.25 mg total) by mouth 2 (two) times daily.  180 tablet  3  . Fluticasone-Salmeterol (ADVAIR) 250-50 MCG/DOSE AEPB Inhale 1 puff into the lungs every 12 (twelve) hours.      . furosemide (LASIX) 40 MG tablet Take 1 tablet (40 mg total) by mouth daily.  30 tablet  6  . ipratropium-albuterol (DUONEB) 0.5-2.5 (3) MG/3ML SOLN Take 3 mLs by nebulization every 6 (six) hours as needed (Shortness of breath).      . losartan (COZAAR) 100 MG tablet Take 0.5 tablets (50 mg total) by mouth  daily.  30 tablet  6  . Multiple Vitamins-Minerals (MENS MULTIVITAMIN PLUS) TABS Take 1 tablet by mouth daily.       Marland Kitchen omeprazole (PRILOSEC) 20 MG capsule Take 20 mg by mouth 2 (two) times daily.       . potassium chloride (K-DUR) 10 MEQ tablet Take 2 tablets (20 mEq total) by mouth daily.  60 tablet  6  . prasugrel (EFFIENT) 10 MG TABS Take 10 mg by mouth daily.      . rosuvastatin (CRESTOR) 10 MG tablet Take 10 mg by mouth daily.      Marland Kitchen warfarin (COUMADIN) 2.5 MG tablet Take 2.5 mg by mouth daily.      . nitroGLYCERIN (NITROSTAT) 0.4 MG SL tablet Place 1 tablet (0.4 mg total) under the tongue every 5 (five) minutes as needed for chest pain.  90 tablet  3   Assessment: 57yo male recently started on Coumadin due to right ventricular thrombus.  Pt's home dose is 2.5mg  daily (recent dose decrease on 7/21).  INR was sub-therapeutic on admission, but has risen with dose increase and is now therapeutic.   No bleeding noted.   Plan to keep INR at low end of goal range since he is on triple anti-platelet therapy (asa, prasugrel, &  warfarin).  Goal of Therapy:  INR 2-3   Plan:  Coumadin 2.5mg  today x 1 INR daily  Basel Defalco A 10/29/2012,8:59 AM

## 2012-10-29 NOTE — Progress Notes (Signed)
Respiratory care note: The pt did not wear BIPAP tonight. The pt stated that the MD told him that hopefully he want have to wear it, however; the MD would order it so if he needed to wear it, he could.

## 2012-10-29 NOTE — Progress Notes (Signed)
[id number] 

## 2012-10-30 LAB — BASIC METABOLIC PANEL
Calcium: 9.8 mg/dL (ref 8.4–10.5)
GFR calc non Af Amer: 68 mL/min — ABNORMAL LOW (ref >90)
Glucose, Bld: 153 mg/dL — ABNORMAL HIGH (ref 70–99)
Sodium: 141 mEq/L (ref 135–145)

## 2012-10-30 LAB — DIFFERENTIAL
Eosinophils Absolute: 0 10*3/uL (ref 0.0–0.7)
Lymphs Abs: 1.4 10*3/uL (ref 0.7–4.0)
Neutrophils Relative %: 90 % — ABNORMAL HIGH (ref 43–77)

## 2012-10-30 LAB — PROTIME-INR
INR: 2.67 — ABNORMAL HIGH (ref 0.00–1.49)
Prothrombin Time: 27.5 seconds — ABNORMAL HIGH (ref 11.6–15.2)

## 2012-10-30 LAB — CBC
MCH: 32.4 pg (ref 26.0–34.0)
Platelets: 242 10*3/uL (ref 150–400)
RBC: 4.35 MIL/uL (ref 4.22–5.81)
WBC: 20.5 10*3/uL — ABNORMAL HIGH (ref 4.0–10.5)

## 2012-10-30 MED ORDER — WARFARIN SODIUM 1 MG PO TABS
1.0000 mg | ORAL_TABLET | Freq: Once | ORAL | Status: AC
  Administered 2012-10-30: 1 mg via ORAL
  Filled 2012-10-30: qty 1

## 2012-10-30 MED ORDER — CLONIDINE HCL 0.1 MG PO TABS
0.1000 mg | ORAL_TABLET | Freq: Three times a day (TID) | ORAL | Status: DC
  Administered 2012-10-30 – 2012-11-01 (×6): 0.1 mg via ORAL
  Filled 2012-10-30 (×6): qty 1

## 2012-10-30 MED ORDER — PREDNISONE 20 MG PO TABS
40.0000 mg | ORAL_TABLET | Freq: Every day | ORAL | Status: DC
  Administered 2012-10-30 – 2012-11-01 (×3): 40 mg via ORAL
  Filled 2012-10-30 (×3): qty 2

## 2012-10-30 NOTE — Progress Notes (Signed)
ANTICOAGULATION CONSULT NOTE   Pharmacy Consult for Coumadin Indication: right ventricular thrombus  Allergies  Allergen Reactions  . Lipitor (Atorvastatin) Other (See Comments)    Muscle spasms    Patient Measurements: Height: 5\' 7"  (170.2 cm) Weight: 167 lb 1.7 oz (75.8 kg) IBW/kg (Calculated) : 66.1  Vital Signs: Temp: 98.2 F (36.8 C) (07/30 0613) Temp src: Oral (07/30 0613) BP: 142/100 mmHg (07/30 0613) Pulse Rate: 92 (07/30 0613)  Labs:  Recent Labs  10/28/12 0512 10/29/12 0449 10/30/12 0454  HGB  --   --  14.1  HCT  --   --  42.9  PLT  --   --  242  LABPROT 22.9* 25.2* 27.5*  INR 2.10* 2.38* 2.67*  CREATININE  --   --  1.18   Estimated Creatinine Clearance: 66.1 ml/min (by C-G formula based on Cr of 1.18).  Medical History: Past Medical History  Diagnosis Date  . GERD (gastroesophageal reflux disease)   . COPD (chronic obstructive pulmonary disease)   . Myocardial infarction 05/02/12  . CHF (congestive heart failure)   . Hypertension    Medications:  Prescriptions prior to admission  Medication Sig Dispense Refill  . acetaminophen (TYLENOL) 325 MG tablet Take 650 mg by mouth every 6 (six) hours as needed for pain.       Marland Kitchen albuterol (PROVENTIL HFA;VENTOLIN HFA) 108 (90 BASE) MCG/ACT inhaler Inhale 2 puffs into the lungs every 6 (six) hours as needed for wheezing.      Marland Kitchen aspirin EC 81 MG tablet Take 81 mg by mouth daily.      . carvedilol (COREG) 6.25 MG tablet Take 1 tablet (6.25 mg total) by mouth 2 (two) times daily.  180 tablet  3  . Fluticasone-Salmeterol (ADVAIR) 250-50 MCG/DOSE AEPB Inhale 1 puff into the lungs every 12 (twelve) hours.      . furosemide (LASIX) 40 MG tablet Take 1 tablet (40 mg total) by mouth daily.  30 tablet  6  . ipratropium-albuterol (DUONEB) 0.5-2.5 (3) MG/3ML SOLN Take 3 mLs by nebulization every 6 (six) hours as needed (Shortness of breath).      . losartan (COZAAR) 100 MG tablet Take 0.5 tablets (50 mg total) by mouth  daily.  30 tablet  6  . Multiple Vitamins-Minerals (MENS MULTIVITAMIN PLUS) TABS Take 1 tablet by mouth daily.       Marland Kitchen omeprazole (PRILOSEC) 20 MG capsule Take 20 mg by mouth 2 (two) times daily.       . potassium chloride (K-DUR) 10 MEQ tablet Take 2 tablets (20 mEq total) by mouth daily.  60 tablet  6  . prasugrel (EFFIENT) 10 MG TABS Take 10 mg by mouth daily.      . rosuvastatin (CRESTOR) 10 MG tablet Take 10 mg by mouth daily.      Marland Kitchen warfarin (COUMADIN) 2.5 MG tablet Take 2.5 mg by mouth daily.      . nitroGLYCERIN (NITROSTAT) 0.4 MG SL tablet Place 1 tablet (0.4 mg total) under the tongue every 5 (five) minutes as needed for chest pain.  90 tablet  3   Assessment: 57yo male recently started on Coumadin due to right ventricular thrombus.  Pt's home dose is 2.5mg  daily (recent dose decrease on 7/21).  INR was sub-therapeutic on admission, but has risen with dose increase and is now therapeutic.   No bleeding noted.   Plan to keep INR at low end of goal range since he is on triple anti-platelet therapy (asa, prasugrel, &  warfarin).  Goal of Therapy:  INR 2-3   Plan:  Coumadin 1mg  today x 1 INR daily  Valrie Hart A 10/30/2012,9:03 AM

## 2012-10-30 NOTE — Progress Notes (Signed)
Subjective: He did use BiPAP last night and tolerated it fairly well. He did not sleep very well. He and his wife are asking about cardiology consultation which apparently although mentioned in the history and physical was never ordered. I have taken the liberty of going ahead and ordering that consultation.  Objective: Vital signs in last 24 hours: Temp:  [98.1 F (36.7 C)-98.3 F (36.8 C)] 98.2 F (36.8 C) (07/30 1610) Pulse Rate:  [92-105] 92 (07/30 0613) Resp:  [18-20] 18 (07/30 0613) BP: (132-142)/(85-100) 142/100 mmHg (07/30 0613) SpO2:  [0 %-96 %] 0 % (07/30 0739) Weight change:  Last BM Date: 10/29/12  Intake/Output from previous day:    PHYSICAL EXAM General appearance: alert, cooperative and no distress Resp: rhonchi bilaterally Cardio: regular rate and rhythm, S1, S2 normal, no murmur, click, rub or gallop GI: soft, non-tender; bowel sounds normal; no masses,  no organomegaly Extremities: extremities normal, atraumatic, no cyanosis or edema  Lab Results:    Basic Metabolic Panel:  Recent Labs  96/04/54 0454  NA 141  K 4.3  CL 101  CO2 36*  GLUCOSE 153*  BUN 24*  CREATININE 1.18  CALCIUM 9.8   Liver Function Tests: No results found for this basename: AST, ALT, ALKPHOS, BILITOT, PROT, ALBUMIN,  in the last 72 hours No results found for this basename: LIPASE, AMYLASE,  in the last 72 hours No results found for this basename: AMMONIA,  in the last 72 hours CBC:  Recent Labs  10/30/12 0454  WBC 20.5*  NEUTROABS 18.5*  HGB 14.1  HCT 42.9  MCV 98.6  PLT 242   Cardiac Enzymes: No results found for this basename: CKTOTAL, CKMB, CKMBINDEX, TROPONINI,  in the last 72 hours BNP: No results found for this basename: PROBNP,  in the last 72 hours D-Dimer: No results found for this basename: DDIMER,  in the last 72 hours CBG: No results found for this basename: GLUCAP,  in the last 72 hours Hemoglobin A1C: No results found for this basename: HGBA1C,  in  the last 72 hours Fasting Lipid Panel: No results found for this basename: CHOL, HDL, LDLCALC, TRIG, CHOLHDL, LDLDIRECT,  in the last 72 hours Thyroid Function Tests: No results found for this basename: TSH, T4TOTAL, FREET4, T3FREE, THYROIDAB,  in the last 72 hours Anemia Panel: No results found for this basename: VITAMINB12, FOLATE, FERRITIN, TIBC, IRON, RETICCTPCT,  in the last 72 hours Coagulation:  Recent Labs  10/29/12 0449 10/30/12 0454  LABPROT 25.2* 27.5*  INR 2.38* 2.67*   Urine Drug Screen: Drugs of Abuse  No results found for this basename: labopia, cocainscrnur, labbenz, amphetmu, thcu, labbarb    Alcohol Level: No results found for this basename: ETH,  in the last 72 hours Urinalysis: No results found for this basename: COLORURINE, APPERANCEUR, LABSPEC, PHURINE, GLUCOSEU, HGBUR, BILIRUBINUR, KETONESUR, PROTEINUR, UROBILINOGEN, NITRITE, LEUKOCYTESUR,  in the last 72 hours Misc. Labs:  ABGS  Recent Labs  10/28/12 0452  PHART 7.344*  PO2ART 65.6*  TCO2 32.7  HCO3 36.5*   CULTURES Recent Results (from the past 240 hour(s))  CULTURE, BLOOD (ROUTINE X 2)     Status: None   Collection Time    10/23/12  4:58 PM      Result Value Range Status   Specimen Description BLOOD LEFT ANTECUBITAL   Final   Special Requests BOTTLES DRAWN AEROBIC AND ANAEROBIC 8CC   Final   Culture NO GROWTH 6 DAYS   Final   Report Status 10/29/2012 FINAL  Final  CULTURE, BLOOD (ROUTINE X 2)     Status: None   Collection Time    10/23/12  5:09 PM      Result Value Range Status   Specimen Description BLOOD LEFT HAND   Final   Special Requests     Final   Value: BOTTLES DRAWN AEROBIC AND ANAEROBIC AEB=12CC ANA=10CC   Culture NO GROWTH 6 DAYS   Final   Report Status 10/29/2012 FINAL   Final  CULTURE, EXPECTORATED SPUTUM-ASSESSMENT     Status: None   Collection Time    10/24/12 10:35 PM      Result Value Range Status   Specimen Description SPUTUM   Final   Special Requests Normal    Final   Sputum evaluation     Final   Value: THIS SPECIMEN IS ACCEPTABLE. RESPIRATORY CULTURE REPORT TO FOLLOW.   Report Status 10/25/2012 FINAL   Final  CULTURE, RESPIRATORY (NON-EXPECTORATED)     Status: None   Collection Time    10/24/12 10:35 PM      Result Value Range Status   Specimen Description SPUTUM   Final   Special Requests NONE   Final   Gram Stain     Final   Value: RARE WBC PRESENT, PREDOMINANTLY PMN     NO SQUAMOUS EPITHELIAL CELLS SEEN     NO ORGANISMS SEEN   Culture NORMAL OROPHARYNGEAL FLORA   Final   Report Status 10/27/2012 FINAL   Final   Studies/Results: No results found.  Medications:  Prior to Admission:  Prescriptions prior to admission  Medication Sig Dispense Refill  . acetaminophen (TYLENOL) 325 MG tablet Take 650 mg by mouth every 6 (six) hours as needed for pain.       Marland Kitchen albuterol (PROVENTIL HFA;VENTOLIN HFA) 108 (90 BASE) MCG/ACT inhaler Inhale 2 puffs into the lungs every 6 (six) hours as needed for wheezing.      Marland Kitchen aspirin EC 81 MG tablet Take 81 mg by mouth daily.      . carvedilol (COREG) 6.25 MG tablet Take 1 tablet (6.25 mg total) by mouth 2 (two) times daily.  180 tablet  3  . Fluticasone-Salmeterol (ADVAIR) 250-50 MCG/DOSE AEPB Inhale 1 puff into the lungs every 12 (twelve) hours.      . furosemide (LASIX) 40 MG tablet Take 1 tablet (40 mg total) by mouth daily.  30 tablet  6  . ipratropium-albuterol (DUONEB) 0.5-2.5 (3) MG/3ML SOLN Take 3 mLs by nebulization every 6 (six) hours as needed (Shortness of breath).      . losartan (COZAAR) 100 MG tablet Take 0.5 tablets (50 mg total) by mouth daily.  30 tablet  6  . Multiple Vitamins-Minerals (MENS MULTIVITAMIN PLUS) TABS Take 1 tablet by mouth daily.       Marland Kitchen omeprazole (PRILOSEC) 20 MG capsule Take 20 mg by mouth 2 (two) times daily.       . potassium chloride (K-DUR) 10 MEQ tablet Take 2 tablets (20 mEq total) by mouth daily.  60 tablet  6  . prasugrel (EFFIENT) 10 MG TABS Take 10 mg by mouth  daily.      . rosuvastatin (CRESTOR) 10 MG tablet Take 10 mg by mouth daily.      Marland Kitchen warfarin (COUMADIN) 2.5 MG tablet Take 2.5 mg by mouth daily.      . nitroGLYCERIN (NITROSTAT) 0.4 MG SL tablet Place 1 tablet (0.4 mg total) under the tongue every 5 (five) minutes as needed for chest pain.  90 tablet  3   Scheduled: . albuterol  2.5 mg Nebulization TID  . aspirin EC  81 mg Oral Daily  . azithromycin  500 mg Oral Daily  . carvedilol  6.25 mg Oral BID WC  . cefTRIAXone (ROCEPHIN)  IV  1 g Intravenous Q24H  . furosemide  40 mg Oral Daily  . guaiFENesin  1,200 mg Oral BID  . ipratropium  0.5 mg Nebulization TID  . losartan  100 mg Oral Daily  . methylPREDNISolone (SOLU-MEDROL) injection  40 mg Intravenous Q6H  . mometasone-formoterol  2 puff Inhalation BID  . pantoprazole  40 mg Oral BID  . potassium chloride  20 mEq Oral QHS  . prasugrel  10 mg Oral Daily  . Warfarin - Pharmacist Dosing Inpatient   Does not apply Q24H   Continuous: . sodium chloride 20 mL/hr (10/27/12 1941)   ZOX:[id number], albuterol, ipratropium, LORazepam, nitroGLYCERIN  Assesment: He has COPD and seems to be doing better. He has chronic respiratory failure and use BiPAP last night and says he thinks that helped. He has a right ventricular mural thrombus. He has community-acquired pneumonia and seems to be getting better. He feels that the steroids have helped Active Problems:   COPD   CAD (coronary artery disease)   RV (right ventricular) mural thrombus   Long term (current) use of anticoagulants   Community acquired pneumonia   HTN (hypertension)    Plan: I will switch him to prednisone today. As mentioned I went ahead and ask for cardiology consultation and he and his wife's request    LOS: 7 days   Jonah Gingras L 10/30/2012, 8:42 AM

## 2012-10-30 NOTE — Progress Notes (Signed)
Pt placed on Bipap with a nasal mask and tolerating well at this time.  RT will continue to monitor.

## 2012-10-30 NOTE — Progress Notes (Signed)
Pt placed on BIPAP 10/5 with 4lpm cann for the night tol well will monitor through out the night

## 2012-10-30 NOTE — Progress Notes (Signed)
[id number] 

## 2012-10-30 NOTE — Progress Notes (Signed)
Danny [name], Danny [name] NO.:  [id number]  MEDICAL RECORD NO.:  [id number]  LOCATION:  A319                          FACILITY:  [facility]  PHYSICIAN:  Melvyn Novas, MDDATE OF BIRTH:  04/23/1955  DATE OF PROCEDURE: DATE OF DISCHARGE:                                PROGRESS NOTE   The patient is status post MI, right ventricular thrombus, currently anticoagulated on Coumadin, status post 10 days.  Several months ago, he continued with hypoxia, much improved and hypercapnia much improved with BiPAP.  The patient may require this at home.  Lungs show diminished breath sounds at the bases.  No rales, wheeze, or rhonchi appreciable. Heart, regular rhythm.  No murmurs, gallops, or rubs.  No S3, S4.  Blood pressure 140/90, pulse 102 and regular, temperature 97.7, respiratory rate is 20, O2 sat is 89%.  The patient continues to desaturate.  Plan right now is to get B-MET and CBC in the morning.  Continue BiPAP and observe saturations and verify blood gas on BiPAP.     Melvyn Novas, MD     RMD/MEDQ  D:  10/29/2012  T:  10/30/2012  Job:  [id number]

## 2012-10-30 NOTE — Progress Notes (Signed)
Patient and patient's wife concerned about right arm swelling s/p IV infiltrate last week.  MD notified.  MD stated he was aware and stated to "let the family know that it is just fluid extravasation and that it will take time to time for the swelling to go down."  Message relayed to family.  Will continue to monitor and made MD aware of any new findings.  Schonewitz, Candelaria Stagers 10/30/2012

## 2012-10-31 DIAGNOSIS — J9620 Acute and chronic respiratory failure, unspecified whether with hypoxia or hypercapnia: Secondary | ICD-10-CM | POA: Diagnosis present

## 2012-10-31 LAB — BASIC METABOLIC PANEL
Calcium: 9.5 mg/dL (ref 8.4–10.5)
Chloride: 101 mEq/L (ref 96–112)
Creatinine, Ser: 1.14 mg/dL (ref 0.50–1.35)
GFR calc Af Amer: 82 mL/min — ABNORMAL LOW (ref >90)
GFR calc non Af Amer: 71 mL/min — ABNORMAL LOW (ref >90)

## 2012-10-31 LAB — PROTIME-INR
INR: 2.36 — ABNORMAL HIGH (ref 0.00–1.49)
Prothrombin Time: 25 seconds — ABNORMAL HIGH (ref 11.6–15.2)

## 2012-10-31 MED ORDER — WARFARIN SODIUM 2.5 MG PO TABS
2.5000 mg | ORAL_TABLET | Freq: Once | ORAL | Status: AC
  Administered 2012-10-31: 2.5 mg via ORAL
  Filled 2012-10-31: qty 1

## 2012-10-31 NOTE — Progress Notes (Signed)
NAMEMCIHAEL, HINDERMAN                ACCOUNT NO.:  [id number]  MEDICAL RECORD NO.:  [id number]  LOCATION:  A319                          FACILITY:  [facility]  PHYSICIAN:  Melvyn Novas, MDDATE OF BIRTH:  [birth year]  DATE OF PROCEDURE: DATE OF DISCHARGE:                                PROGRESS NOTE   SUBJECTIVE:  The patient has status post MI several months ago, right ventricular thrombus, currently on anticoagulation with Coumadin and therapeutic, ejection fraction of 40-45%, status post stent several months ago after MI, hypertension, hyperlipidemia, history of bullous emphysema.  The patient has community-acquired pneumonia, currently on Rocephin and Zithromax with the addition of steroids for several days, has continued desaturation, placed on BiPAP, currently on oral inhaled steroids.  OBJECTIVE:  VITAL SIGNS:  Blood pressure 142/100, temperature 98.2, pulse 92 and regular, respiratory rate is 18. LUNGS:  Show diminished breath sounds at the bases.  Prolonged expiratory phase.  No wheeze audible.  No rhonchi or rales appreciable. HEART:  Regular rhythm.  No S3 or S4.  No heaves, thrills, or rubs. ABDOMEN:  Essentially benign.  INR is 2.67 therapeutic.  Hemoglobin 14, currently on Rocephin and Zithromax, and prednisone tablets 40 per day.  PLAN:  Right now is to add clonidine 0.1 mg p.o. t.i.d. to his current carvedilol and Lasix 40 a day.  Monitor desaturation episodes.  Continue with BiPAP and antibiotics as per Pulmonary.  Cardiology consultation has been ordered as per family request.     Melvyn Novas, MD     RMD/MEDQ  D:  10/30/2012  T:  10/31/2012  Job:  [id number]

## 2012-10-31 NOTE — Progress Notes (Signed)
NAMESHIGEO, BAUGH                ACCOUNT NO.:  [id number]  MEDICAL RECORD NO.:  [id number]  LOCATION:  A319                          FACILITY:  [facility]  PHYSICIAN:  Melvyn Novas, MDDATE OF BIRTH:  09/02/1955  DATE OF PROCEDURE: DATE OF DISCHARGE:                                PROGRESS NOTE   The patient has right lower lobe infiltrate, pneumonia, chronic desaturations currently on BiPAP with good O2 saturations of 99%, 40%. Temperature 97.5, pulse is 88 and regular, respiratory rate is 19, blood pressure 136/96 with the addition of clonidine yesterday in addition to his Cozaar and carvedilol.  The patient has anticoagulation due to right ventricular thrombus status post MI several months ago with stenting, EF of 45%.  INR is 2.36, creatinine 1.14, potassium 4.1.  Currently on Rocephin and Zithromax as well as nebulizer therapy.  Planright now is to ambulate the patient and see if he desaturates. Continue facemask per Pulmonary and hopefully his desaturation will resolve.     Melvyn Novas, MD     RMD/MEDQ  D:  10/31/2012  T:  10/31/2012  Job:  [id number]

## 2012-10-31 NOTE — Progress Notes (Signed)
[id number] 

## 2012-10-31 NOTE — Progress Notes (Signed)
ANTICOAGULATION CONSULT NOTE   Pharmacy Consult for Coumadin Indication: right ventricular thrombus  Allergies  Allergen Reactions  . Lipitor (Atorvastatin) Other (See Comments)    Muscle spasms    Patient Measurements: Height: 5\' 7"  (170.2 cm) Weight: 170 lb 10.2 oz (77.4 kg) IBW/kg (Calculated) : 66.1  Vital Signs: Temp: 97.5 F (36.4 C) (07/31 0519) Temp src: Oral (07/31 0519) BP: 136/96 mmHg (07/31 0519) Pulse Rate: 88 (07/31 0519)  Labs:  Recent Labs  10/29/12 0449 10/30/12 0454 10/31/12 0502  HGB  --  14.1  --   HCT  --  42.9  --   PLT  --  242  --   LABPROT 25.2* 27.5* 25.0*  INR 2.38* 2.67* 2.36*  CREATININE  --  1.18 1.14   Estimated Creatinine Clearance: 68.5 ml/min (by C-G formula based on Cr of 1.14).  Medical History: Past Medical History  Diagnosis Date  . GERD (gastroesophageal reflux disease)   . COPD (chronic obstructive pulmonary disease)   . Myocardial infarction 05/02/12  . CHF (congestive heart failure)   . Hypertension    Medications:  Prescriptions prior to admission  Medication Sig Dispense Refill  . acetaminophen (TYLENOL) 325 MG tablet Take 650 mg by mouth every 6 (six) hours as needed for pain.       Marland Kitchen albuterol (PROVENTIL HFA;VENTOLIN HFA) 108 (90 BASE) MCG/ACT inhaler Inhale 2 puffs into the lungs every 6 (six) hours as needed for wheezing.      Marland Kitchen aspirin EC 81 MG tablet Take 81 mg by mouth daily.      . carvedilol (COREG) 6.25 MG tablet Take 1 tablet (6.25 mg total) by mouth 2 (two) times daily.  180 tablet  3  . Fluticasone-Salmeterol (ADVAIR) 250-50 MCG/DOSE AEPB Inhale 1 puff into the lungs every 12 (twelve) hours.      . furosemide (LASIX) 40 MG tablet Take 1 tablet (40 mg total) by mouth daily.  30 tablet  6  . ipratropium-albuterol (DUONEB) 0.5-2.5 (3) MG/3ML SOLN Take 3 mLs by nebulization every 6 (six) hours as needed (Shortness of breath).      . losartan (COZAAR) 100 MG tablet Take 0.5 tablets (50 mg total) by mouth  daily.  30 tablet  6  . Multiple Vitamins-Minerals (MENS MULTIVITAMIN PLUS) TABS Take 1 tablet by mouth daily.       Marland Kitchen omeprazole (PRILOSEC) 20 MG capsule Take 20 mg by mouth 2 (two) times daily.       . potassium chloride (K-DUR) 10 MEQ tablet Take 2 tablets (20 mEq total) by mouth daily.  60 tablet  6  . prasugrel (EFFIENT) 10 MG TABS Take 10 mg by mouth daily.      . rosuvastatin (CRESTOR) 10 MG tablet Take 10 mg by mouth daily.      Marland Kitchen warfarin (COUMADIN) 2.5 MG tablet Take 2.5 mg by mouth daily.      . nitroGLYCERIN (NITROSTAT) 0.4 MG SL tablet Place 1 tablet (0.4 mg total) under the tongue every 5 (five) minutes as needed for chest pain.  90 tablet  3   Assessment: 57yo male recently started on Coumadin due to right ventricular thrombus.  Pt's home dose is 2.5mg  daily (recent outpatient dose decrease on 7/21).  INR was sub-therapeutic on admission, but has risen with dose increase and is now therapeutic.   No bleeding noted.   Plan to keep INR at low end of goal range since he is on triple anti-platelet therapy (asa, prasugrel, & warfarin).  Goal of Therapy:  INR 2-3   Plan:  Coumadin 2.5mg  today x 1 INR daily  Elson Clan 10/31/2012,8:04 AM

## 2012-10-31 NOTE — Progress Notes (Signed)
Nutrition Brief Note  Patient identified on the Malnutrition Screening Tool (MST) Report and Length of stay.  Body mass index is 26.72 kg/(m^2). Patient meets criteria for overweight based on current BMI.   Wt Readings from Last 10 Encounters:  10/31/12 170 lb 10.2 oz (77.4 kg)  10/18/12 163 lb (73.936 kg)  10/18/12 163 lb 4 oz (74.05 kg)  10/14/12 163 lb 4 oz (74.05 kg)  09/26/12 168 lb 3.2 oz (76.295 kg)  09/18/12 170 lb (77.111 kg)  06/25/12 174 lb 3.2 oz (79.017 kg)  05/30/12 172 lb (78.019 kg)  05/23/12 175 lb (79.379 kg)  05/10/12 186 lb 3.2 oz (84.46 kg)    Current diet order is Heart Healthy, patient is consuming approximately 100% of meals at this time. Labs and medications reviewed.   No nutrition interventions warranted at this time. If nutrition issues arise, please consult RD.   Royann Shivers MS,RD,LDN,CSG Office: [phone] Pager: [phone]

## 2012-10-31 NOTE — Progress Notes (Signed)
Subjective: He says he did well with BiPAP last night he has no new complaints. He wants to ambulate and see if he has desaturation  Objective: Vital signs in last 24 hours: Temp:  [97.5 F (36.4 C)-98 F (36.7 C)] 97.5 F (36.4 C) (07/31 0519) Pulse Rate:  [84-99] 88 (07/31 0519) Resp:  [19] 19 (07/31 0519) BP: (116-136)/(71-100) 136/96 mmHg (07/31 0519) SpO2:  [91 %-99 %] 93 % (07/31 0659) Weight:  [77.4 kg (170 lb 10.2 oz)] 77.4 kg (170 lb 10.2 oz) (07/31 0519) Weight change:  Last BM Date: 10/30/12  Intake/Output from previous day: 07/30 0701 - 07/31 0700 In: 840 [P.O.:840] Out: -   PHYSICAL EXAM General appearance: alert, cooperative and no distress Resp: rhonchi bilaterally Cardio: regular rate and rhythm, S1, S2 normal, no murmur, click, rub or gallop GI: soft, non-tender; bowel sounds normal; no masses,  no organomegaly Extremities: extremities normal, atraumatic, no cyanosis or edema  Lab Results:    Basic Metabolic Panel:  Recent Labs  40/10/27 0454 10/31/12 0502  NA 141 142  K 4.3 4.1  CL 101 101  CO2 36* 35*  GLUCOSE 153* 150*  BUN 24* 25*  CREATININE 1.18 1.14  CALCIUM 9.8 9.5   Liver Function Tests: No results found for this basename: AST, ALT, ALKPHOS, BILITOT, PROT, ALBUMIN,  in the last 72 hours No results found for this basename: LIPASE, AMYLASE,  in the last 72 hours No results found for this basename: AMMONIA,  in the last 72 hours CBC:  Recent Labs  10/30/12 0454  WBC 20.5*  NEUTROABS 18.5*  HGB 14.1  HCT 42.9  MCV 98.6  PLT 242   Cardiac Enzymes: No results found for this basename: CKTOTAL, CKMB, CKMBINDEX, TROPONINI,  in the last 72 hours BNP: No results found for this basename: PROBNP,  in the last 72 hours D-Dimer: No results found for this basename: DDIMER,  in the last 72 hours CBG: No results found for this basename: GLUCAP,  in the last 72 hours Hemoglobin A1C: No results found for this basename: HGBA1C,  in the  last 72 hours Fasting Lipid Panel: No results found for this basename: CHOL, HDL, LDLCALC, TRIG, CHOLHDL, LDLDIRECT,  in the last 72 hours Thyroid Function Tests: No results found for this basename: TSH, T4TOTAL, FREET4, T3FREE, THYROIDAB,  in the last 72 hours Anemia Panel: No results found for this basename: VITAMINB12, FOLATE, FERRITIN, TIBC, IRON, RETICCTPCT,  in the last 72 hours Coagulation:  Recent Labs  10/30/12 0454 10/31/12 0502  LABPROT 27.5* 25.0*  INR 2.67* 2.36*   Urine Drug Screen: Drugs of Abuse  No results found for this basename: labopia, cocainscrnur, labbenz, amphetmu, thcu, labbarb    Alcohol Level: No results found for this basename: ETH,  in the last 72 hours Urinalysis: No results found for this basename: COLORURINE, APPERANCEUR, LABSPEC, PHURINE, GLUCOSEU, HGBUR, BILIRUBINUR, KETONESUR, PROTEINUR, UROBILINOGEN, NITRITE, LEUKOCYTESUR,  in the last 72 hours Misc. Labs:  ABGS No results found for this basename: PHART, PCO2, PO2ART, TCO2, HCO3,  in the last 72 hours CULTURES Recent Results (from the past 240 hour(s))  CULTURE, BLOOD (ROUTINE X 2)     Status: None   Collection Time    10/23/12  4:58 PM      Result Value Range Status   Specimen Description BLOOD LEFT ANTECUBITAL   Final   Special Requests BOTTLES DRAWN AEROBIC AND ANAEROBIC 8CC   Final   Culture NO GROWTH 6 DAYS   Final  Report Status 10/29/2012 FINAL   Final  CULTURE, BLOOD (ROUTINE X 2)     Status: None   Collection Time    10/23/12  5:09 PM      Result Value Range Status   Specimen Description BLOOD LEFT HAND   Final   Special Requests     Final   Value: BOTTLES DRAWN AEROBIC AND ANAEROBIC AEB=12CC ANA=10CC   Culture NO GROWTH 6 DAYS   Final   Report Status 10/29/2012 FINAL   Final  CULTURE, EXPECTORATED SPUTUM-ASSESSMENT     Status: None   Collection Time    10/24/12 10:35 PM      Result Value Range Status   Specimen Description SPUTUM   Final   Special Requests Normal    Final   Sputum evaluation     Final   Value: THIS SPECIMEN IS ACCEPTABLE. RESPIRATORY CULTURE REPORT TO FOLLOW.   Report Status 10/25/2012 FINAL   Final  CULTURE, RESPIRATORY (NON-EXPECTORATED)     Status: None   Collection Time    10/24/12 10:35 PM      Result Value Range Status   Specimen Description SPUTUM   Final   Special Requests NONE   Final   Gram Stain     Final   Value: RARE WBC PRESENT, PREDOMINANTLY PMN     NO SQUAMOUS EPITHELIAL CELLS SEEN     NO ORGANISMS SEEN   Culture NORMAL OROPHARYNGEAL FLORA   Final   Report Status 10/27/2012 FINAL   Final   Studies/Results: No results found.  Medications:  Prior to Admission:  Prescriptions prior to admission  Medication Sig Dispense Refill  . acetaminophen (TYLENOL) 325 MG tablet Take 650 mg by mouth every 6 (six) hours as needed for pain.       Marland Kitchen albuterol (PROVENTIL HFA;VENTOLIN HFA) 108 (90 BASE) MCG/ACT inhaler Inhale 2 puffs into the lungs every 6 (six) hours as needed for wheezing.      Marland Kitchen aspirin EC 81 MG tablet Take 81 mg by mouth daily.      . carvedilol (COREG) 6.25 MG tablet Take 1 tablet (6.25 mg total) by mouth 2 (two) times daily.  180 tablet  3  . Fluticasone-Salmeterol (ADVAIR) 250-50 MCG/DOSE AEPB Inhale 1 puff into the lungs every 12 (twelve) hours.      . furosemide (LASIX) 40 MG tablet Take 1 tablet (40 mg total) by mouth daily.  30 tablet  6  . ipratropium-albuterol (DUONEB) 0.5-2.5 (3) MG/3ML SOLN Take 3 mLs by nebulization every 6 (six) hours as needed (Shortness of breath).      . losartan (COZAAR) 100 MG tablet Take 0.5 tablets (50 mg total) by mouth daily.  30 tablet  6  . Multiple Vitamins-Minerals (MENS MULTIVITAMIN PLUS) TABS Take 1 tablet by mouth daily.       Marland Kitchen omeprazole (PRILOSEC) 20 MG capsule Take 20 mg by mouth 2 (two) times daily.       . potassium chloride (K-DUR) 10 MEQ tablet Take 2 tablets (20 mEq total) by mouth daily.  60 tablet  6  . prasugrel (EFFIENT) 10 MG TABS Take 10 mg by mouth  daily.      . rosuvastatin (CRESTOR) 10 MG tablet Take 10 mg by mouth daily.      Marland Kitchen warfarin (COUMADIN) 2.5 MG tablet Take 2.5 mg by mouth daily.      . nitroGLYCERIN (NITROSTAT) 0.4 MG SL tablet Place 1 tablet (0.4 mg total) under the tongue every 5 (five) minutes  as needed for chest pain.  90 tablet  3   Scheduled: . albuterol  2.5 mg Nebulization TID  . aspirin EC  81 mg Oral Daily  . azithromycin  500 mg Oral Daily  . carvedilol  6.25 mg Oral BID WC  . cloNIDine  0.1 mg Oral TID  . furosemide  40 mg Oral Daily  . guaiFENesin  1,200 mg Oral BID  . ipratropium  0.5 mg Nebulization TID  . losartan  100 mg Oral Daily  . mometasone-formoterol  2 puff Inhalation BID  . pantoprazole  40 mg Oral BID  . potassium chloride  20 mEq Oral QHS  . prasugrel  10 mg Oral Daily  . predniSONE  40 mg Oral Q breakfast  . warfarin  2.5 mg Oral ONCE-1800  . Warfarin - Pharmacist Dosing Inpatient   Does not apply Q24H   Continuous: . sodium chloride 20 mL/hr (10/27/12 1941)   ZOX:[id number], albuterol, ipratropium, LORazepam, nitroGLYCERIN  Assesment: He was admitted with community-acquired pneumonia. He has severe COPD. Is a right ventricular thrombus. He has respiratory failure acute on chronic and has done well on BiPAP Active Problems:   COPD   CAD (coronary artery disease)   RV (right ventricular) mural thrombus   Long term (current) use of anticoagulants   Community acquired pneumonia   HTN (hypertension)    Plan: Continue with current treatments. I have ordered his BiPAP for home    LOS: 8 days   Ivelise Castillo L 10/31/2012, 8:36 AM

## 2012-11-01 LAB — BASIC METABOLIC PANEL
BUN: 22 mg/dL (ref 6–23)
Calcium: 9.2 mg/dL (ref 8.4–10.5)
Creatinine, Ser: 1.17 mg/dL (ref 0.50–1.35)
GFR calc Af Amer: 79 mL/min — ABNORMAL LOW (ref >90)
GFR calc non Af Amer: 69 mL/min — ABNORMAL LOW (ref >90)
Potassium: 4.1 mEq/L (ref 3.5–5.1)

## 2012-11-01 LAB — PROTIME-INR: Prothrombin Time: 23.3 seconds — ABNORMAL HIGH (ref 11.6–15.2)

## 2012-11-01 MED ORDER — ALBUTEROL SULFATE (5 MG/ML) 0.5% IN NEBU: 2.5000 mg | INHALATION_SOLUTION | Freq: Three times a day (TID) | RESPIRATORY_TRACT | Status: DC

## 2012-11-01 MED ORDER — PREDNISONE 20 MG PO TABS: 20.0000 mg | ORAL_TABLET | Freq: Every day | ORAL | Status: DC

## 2012-11-01 MED ORDER — GUAIFENESIN ER 600 MG PO TB12: 1200.0000 mg | ORAL_TABLET | Freq: Two times a day (BID) | ORAL | Status: DC

## 2012-11-01 MED ORDER — WARFARIN SODIUM 2.5 MG PO TABS: 2.5000 mg | ORAL_TABLET | Freq: Once | ORAL | Status: DC

## 2012-11-01 MED ORDER — CLONIDINE HCL 0.1 MG PO TABS: 0.1000 mg | ORAL_TABLET | Freq: Three times a day (TID) | ORAL | Status: DC

## 2012-11-01 NOTE — Discharge Summary (Signed)
[name], [name] NO.:  [id number]  MEDICAL RECORD NO.:  [id number]  LOCATION:                                 FACILITY:  PHYSICIAN:  Melvyn Novas, MDDATE OF BIRTH:  11-Jul-1955  DATE OF ADMISSION:  10/23/2012 DATE OF DISCHARGE:  LH                              DISCHARGE SUMMARY   The patient has a severe; 1. COPD. Acute on chronic exacerbation with respiratory insufficiency     desaturation due to right lower lobe infiltrate. 2. He has status post MI with a right ventricular thrombus, currently     anticoagulated on Coumadin. 3. Hypertension. 4. Hyperlipidemia. 5. Some chronic noncompliance. 6. Status post stenting several months ago after his MI.  The patient had cough, fever, chills, and right lower lobe infiltrate, and was placed on Rocephin and Zithromax appropriately and continued to desaturate while in the hospital.  He subsequently had his O2 mask increased to 40% and to 50%, and then subsequently seen in consultation by Pulmonology who recommended BiPAP, continue antibiotics and placed on steroids.  He continued to improve.  Had no further desaturations and continued on antibiotics.  He was on no further signs of desaturation. BiPAP was ordered for home and he was felt to be able to go home and follow up with his primary care doctor myself in 3 days, as well as Dr. Juanetta Gosling in office.  DISCHARGE MEDICATIONS: 1. Clonidine 0.1 mg p.o. t.i.d. 2. Mucinex 600 mg p.o. q.12. 3. Prednisone 20 mg p.o. daily for 10 days. 4. Aspirin 81 mg p.o. daily. 5. DuoNeb nebulizer q.i.d. 6. Advair 250-50 q.12. 7. Lasix 40 mg p.o. daily. 8. Cozaar 100 mg p.o. daily. 9. Sublingual nitroglycerin 0.4 mg p.r.n. p.o. sublingual. 10.Prilosec 20 mg p.o. daily. 11.K-Dur 10 mEq p.o. daily. 12.Crestor 10 mg p.o. daily. 13.Effient 10 mg p.o. daily. 14.Warfarin 2.5 mg p.o. daily.  To follow up with myself for maintenance of therapeutic INR between 2 and 3.   The patient seems to understand this and will follow up in several days time.     Melvyn Novas, MD     RMD/MEDQ  D:  11/01/2012  T:  11/01/2012  Job:  [id number]

## 2012-11-01 NOTE — Progress Notes (Signed)
[id number] 

## 2012-11-01 NOTE — Progress Notes (Signed)
Pt ambulated in hallway with assistance from NT on room air.  O2 saturation 68% on room air.  Pt then placed on 3 LPM O2 via [place]. O2 saturation 85% on 3LPM.  Pt chronically wears 4LPM O2 continuously.  Dr. Janna Arch made aware of above.  New order received to discharge patient today, with 4LPM of O2 via [place].

## 2012-11-01 NOTE — Progress Notes (Signed)
ANTICOAGULATION CONSULT NOTE   Pharmacy Consult for Coumadin Indication: right ventricular thrombus  Allergies  Allergen Reactions  . Lipitor (Atorvastatin) Other (See Comments)    Muscle spasms    Patient Measurements: Height: 5\' 7"  (170.2 cm) Weight: 170 lb 10.2 oz (77.4 kg) IBW/kg (Calculated) : 66.1  Vital Signs: Temp: 97.8 F (36.6 C) (08/01 0443) Temp src: Oral (08/01 0443) BP: 137/98 mmHg (08/01 0443) Pulse Rate: 87 (08/01 0443)  Labs:  Recent Labs  10/30/12 0454 10/31/12 0502 11/01/12 0507  HGB 14.1  --   --   HCT 42.9  --   --   PLT 242  --   --   LABPROT 27.5* 25.0* 23.3*  INR 2.67* 2.36* 2.15*  CREATININE 1.18 1.14 1.17   Estimated Creatinine Clearance: 66.7 ml/min (by C-G formula based on Cr of 1.17).  Medical History: Past Medical History  Diagnosis Date  . GERD (gastroesophageal reflux disease)   . COPD (chronic obstructive pulmonary disease)   . Myocardial infarction 05/02/12  . CHF (congestive heart failure)   . Hypertension    Medications:  Prescriptions prior to admission  Medication Sig Dispense Refill  . albuterol (PROVENTIL HFA;VENTOLIN HFA) 108 (90 BASE) MCG/ACT inhaler Inhale 2 puffs into the lungs every 6 (six) hours as needed for wheezing.      Marland Kitchen aspirin EC 81 MG tablet Take 81 mg by mouth daily.      . carvedilol (COREG) 6.25 MG tablet Take 1 tablet (6.25 mg total) by mouth 2 (two) times daily.  180 tablet  3  . Fluticasone-Salmeterol (ADVAIR) 250-50 MCG/DOSE AEPB Inhale 1 puff into the lungs every 12 (twelve) hours.      . furosemide (LASIX) 40 MG tablet Take 1 tablet (40 mg total) by mouth daily.  30 tablet  6  . ipratropium-albuterol (DUONEB) 0.5-2.5 (3) MG/3ML SOLN Take 3 mLs by nebulization every 6 (six) hours as needed (Shortness of breath).      . losartan (COZAAR) 100 MG tablet Take 0.5 tablets (50 mg total) by mouth daily.  30 tablet  6  . Multiple Vitamins-Minerals (MENS MULTIVITAMIN PLUS) TABS Take 1 tablet by mouth daily.        Marland Kitchen omeprazole (PRILOSEC) 20 MG capsule Take 20 mg by mouth 2 (two) times daily.       . potassium chloride (K-DUR) 10 MEQ tablet Take 2 tablets (20 mEq total) by mouth daily.  60 tablet  6  . prasugrel (EFFIENT) 10 MG TABS Take 10 mg by mouth daily.      . rosuvastatin (CRESTOR) 10 MG tablet Take 10 mg by mouth daily.      Marland Kitchen warfarin (COUMADIN) 2.5 MG tablet Take 2.5 mg by mouth daily.      . [DISCONTINUED] acetaminophen (TYLENOL) 325 MG tablet Take 650 mg by mouth every 6 (six) hours as needed for pain.       . nitroGLYCERIN (NITROSTAT) 0.4 MG SL tablet Place 1 tablet (0.4 mg total) under the tongue every 5 (five) minutes as needed for chest pain.  90 tablet  3   Assessment: 57yo male recently started on Coumadin due to right ventricular thrombus.  Pt's home dose is 2.5mg  daily (recent outpatient dose decrease on 7/21).  INR was sub-therapeutic on admission, but has risen with dose increase and is now therapeutic.   No bleeding noted.   Plan to keep INR at low end of goal range since he is on triple anti-platelet therapy (asa, prasugrel, & warfarin).  Goal of Therapy:  INR 2-3   Plan:  Coumadin 2.5mg  today x 1 INR daily  Adrain Nesbit A 11/01/2012,7:47 AM

## 2012-11-01 NOTE — Progress Notes (Signed)
Pt discharged home today with Bipap and O2 at 4LPM via [place] per Dr. Janna Arch. Pt's IV site D/C'd and WNL. Pt's VS stable at this time. Pt provided with home medication list, discharge instructions and prescriptions. Verbalized understanding. Pt left floor via WC in stable condition accompanied by NT.

## 2012-11-01 NOTE — Plan of Care (Signed)
Problem: Phase II Progression Outcomes Goal: Wean O2 if indicated Outcome: Completed/Met Date Met:  11/01/12 Pt to be discharged home on 4LPM of O2 via [place]

## 2012-11-01 NOTE — Progress Notes (Signed)
NAMEWENDEL, Danny [name]                ACCOUNT NO.:  [id number]  MEDICAL RECORD NO.:  [id number]  LOCATION:  A319                          FACILITY:  [facility]  PHYSICIAN:  Melvyn Novas, MDDATE OF BIRTH:  March 15, 1956  DATE OF PROCEDURE: DATE OF DISCHARGE:                                PROGRESS NOTE   The patient has severe COPD, acute on chronic respiratory insufficiency due to right lower lobe infiltrate, right ventricular mural thrombus, status post MI, ejection fraction of 45%, long-term use of anticoagulants, and community-acquired pneumonia and hypertension, hyperlipidemia.  The patient is currently tolerating BiPAP in room.  No evidence of desaturation on chart at rest.  The patient will ambulate in the hallway with his oxygen and see if he desaturates.  Currently on Rocephin and Zithromax as well as nebulizer therapy or just on oral prednisone currently.  Lungs show diminished breath sounds at the bases. Mild prolonged expiratory phase.  Scattered rhonchi.  No wheeze.  No rales audible.  Heart regular rhythm.  No S3, S4.  Blood pressure is 137/98, temperature 97.8, pulse is 87 and regular, respiratory rate is 18, potassium 4.1, INR is 2.15 on Coumadin.  Right now, there is planned current therapy if patient does not desaturate, we will hopefully discharge within 24 hours.     Melvyn Novas, MD     RMD/MEDQ  D:  11/01/2012  T:  11/01/2012  Job:  [id number]

## 2012-11-01 NOTE — Progress Notes (Signed)
UR chart review completed.  

## 2012-11-01 NOTE — Progress Notes (Signed)
He feels much better. He is set for probable discharge later today. He has no new complaints. He is scheduled to start BiPAP and has an overnight oximetry plan for tonight. If he shows desaturation on his normal oxygen level then he will be a candidate for BiPAP. I think he probably will qualify based on the hypoxia he has here in the hospital. He will need another chest x-ray to document that his pneumonia has resolved. With your permission I plan to see him in my office in about 2 weeks

## 2012-11-01 NOTE — Discharge Summary (Signed)
[id number] 

## 2012-11-07 ENCOUNTER — Ambulatory Visit (INDEPENDENT_AMBULATORY_CARE_PROVIDER_SITE_OTHER): Payer: Managed Care, Other (non HMO) | Admitting: *Deleted

## 2012-11-07 DIAGNOSIS — I219 Acute myocardial infarction, unspecified: Secondary | ICD-10-CM

## 2012-11-07 DIAGNOSIS — Z7901 Long term (current) use of anticoagulants: Secondary | ICD-10-CM

## 2012-11-07 DIAGNOSIS — I513 Intracardiac thrombosis, not elsewhere classified: Secondary | ICD-10-CM

## 2012-11-11 ENCOUNTER — Ambulatory Visit (INDEPENDENT_AMBULATORY_CARE_PROVIDER_SITE_OTHER): Payer: Managed Care, Other (non HMO) | Admitting: *Deleted

## 2012-11-11 DIAGNOSIS — I513 Intracardiac thrombosis, not elsewhere classified: Secondary | ICD-10-CM

## 2012-11-11 DIAGNOSIS — Z7901 Long term (current) use of anticoagulants: Secondary | ICD-10-CM

## 2012-11-11 DIAGNOSIS — I219 Acute myocardial infarction, unspecified: Secondary | ICD-10-CM

## 2012-11-18 ENCOUNTER — Telehealth: Payer: Self-pay | Admitting: *Deleted

## 2012-11-18 ENCOUNTER — Ambulatory Visit (INDEPENDENT_AMBULATORY_CARE_PROVIDER_SITE_OTHER): Payer: Managed Care, Other (non HMO) | Admitting: *Deleted

## 2012-11-18 DIAGNOSIS — I219 Acute myocardial infarction, unspecified: Secondary | ICD-10-CM

## 2012-11-18 DIAGNOSIS — I513 Intracardiac thrombosis, not elsewhere classified: Secondary | ICD-10-CM

## 2012-11-18 DIAGNOSIS — Z7901 Long term (current) use of anticoagulants: Secondary | ICD-10-CM

## 2012-11-18 NOTE — Telephone Encounter (Signed)
Pt came in for a coumadin check in office today and wanted to advise the MD that he has lost 6 pounds in one day, pt noted he feels really good but wanted to let the doctor be aware, his current vital signs in office were noted as BP 141/93 HR 96 O2 noted 92% via 2L of oxygen, pt denies chest pain/SOB/swelling please advise if any changes need to be addressed

## 2012-11-19 NOTE — Telephone Encounter (Signed)
Noted. If wt loss continues at such a drastic amount, have him see PCP for testing.

## 2012-11-19 NOTE — Telephone Encounter (Signed)
Spoke to pt to advise results/instructions. Pt understood. Pt realized that he actually weighed with clothes on the day before when he weighed as apposed to only undergarments, pt will continue to weigh the same time daily with the same clothing on to keep the baseline the same, Pt will call back to our office with any further concerns if necessary

## 2012-11-25 ENCOUNTER — Other Ambulatory Visit: Payer: Self-pay | Admitting: *Deleted

## 2012-11-25 MED ORDER — WARFARIN SODIUM 2.5 MG PO TABS: 2.5000 mg | ORAL_TABLET | Freq: Every day | ORAL | Status: DC

## 2012-11-28 ENCOUNTER — Encounter: Payer: Self-pay | Admitting: Cardiology

## 2012-11-28 ENCOUNTER — Ambulatory Visit (INDEPENDENT_AMBULATORY_CARE_PROVIDER_SITE_OTHER): Payer: Managed Care, Other (non HMO) | Admitting: *Deleted

## 2012-11-28 ENCOUNTER — Ambulatory Visit (HOSPITAL_COMMUNITY)
Admission: RE | Admit: 2012-11-28 | Discharge: 2012-11-28 | Disposition: A | Discharge status: Home / Self Care | Payer: Managed Care, Other (non HMO) | Source: Ambulatory Visit | Attending: Pulmonary Disease | Admitting: Pulmonary Disease

## 2012-11-28 ENCOUNTER — Ambulatory Visit (INDEPENDENT_AMBULATORY_CARE_PROVIDER_SITE_OTHER): Payer: Managed Care, Other (non HMO) | Admitting: Cardiology

## 2012-11-28 ENCOUNTER — Ambulatory Visit: Payer: Managed Care, Other (non HMO) | Admitting: Cardiology

## 2012-11-28 VITALS — BP 127/83 | HR 104 | Ht 67.0 in | Wt 166.5 lb

## 2012-11-28 DIAGNOSIS — I429 Cardiomyopathy, unspecified: Secondary | ICD-10-CM | POA: Insufficient documentation

## 2012-11-28 DIAGNOSIS — J449 Chronic obstructive pulmonary disease, unspecified: Secondary | ICD-10-CM

## 2012-11-28 DIAGNOSIS — Z7901 Long term (current) use of anticoagulants: Secondary | ICD-10-CM

## 2012-11-28 DIAGNOSIS — R0989 Other specified symptoms and signs involving the circulatory and respiratory systems: Secondary | ICD-10-CM | POA: Insufficient documentation

## 2012-11-28 DIAGNOSIS — I513 Intracardiac thrombosis, not elsewhere classified: Secondary | ICD-10-CM

## 2012-11-28 DIAGNOSIS — I10 Essential (primary) hypertension: Secondary | ICD-10-CM

## 2012-11-28 DIAGNOSIS — I2510 Atherosclerotic heart disease of native coronary artery without angina pectoris: Secondary | ICD-10-CM

## 2012-11-28 DIAGNOSIS — R0609 Other forms of dyspnea: Secondary | ICD-10-CM | POA: Insufficient documentation

## 2012-11-28 DIAGNOSIS — I219 Acute myocardial infarction, unspecified: Secondary | ICD-10-CM

## 2012-11-28 LAB — BLOOD GAS, ARTERIAL
Bicarbonate: 32.4 mEq/L — ABNORMAL HIGH (ref 20.0–24.0)
TCO2: 28.1 mmol/L (ref 0–100)
pCO2 arterial: 50.4 mmHg — ABNORMAL HIGH (ref 35.0–45.0)
pH, Arterial: 7.424 (ref 7.350–7.450)

## 2012-11-28 NOTE — Patient Instructions (Addendum)
Your physician recommends that you schedule a follow-up appointment in: 2 months.  Your physician recommends that you continue on your current medications as directed. Please refer to the Current Medication list given to you today.  

## 2012-11-28 NOTE — Assessment & Plan Note (Signed)
Keep follow up with Dr. Delton Coombes.

## 2012-11-28 NOTE — Assessment & Plan Note (Signed)
LVEF 40-45%. Continue current medical treatment.

## 2012-11-28 NOTE — Progress Notes (Signed)
Clinical Summary Danny [name] is a 57 y.o.male presenting for office visit. This is my first meeting with him. He is a former patient of Dr. Eden [name]. Recent history reviewed including documentation of a probable right ventricular mural thrombus associated with severe RV dysfunction in the setting of known COPD. Workup for pulmonary embolus was negative. Cardiac history also detailed below including DES to the OM in February of this year at an outside facility.  He states that he has been doing relatively well in terms of his breathing status, no angina. Reports compliance with his medications including Coumadin that is now followed in the Coumadin clinic.  We discussed his recent bleeding risk on aspirin, Effient, and Coumadin. Decision made to stop aspirin at this point with history of DES in February of this year.  He reports having a followup pulmonary visit with Dr. Delton [name] in September.   Allergies  Allergen Reactions  . Lipitor [Atorvastatin] Other (See Comments)    Muscle spasms     Current Outpatient Prescriptions  Medication Sig Dispense Refill  . albuterol (PROVENTIL HFA;VENTOLIN HFA) 108 (90 BASE) MCG/ACT inhaler Inhale 2 puffs into the lungs every 6 (six) hours as needed for wheezing.      Marland Kitchen albuterol (PROVENTIL) (5 MG/ML) 0.5% nebulizer solution Take 0.5 mLs (2.5 mg total) by nebulization 3 (three) times daily.  20 mL  12  . aspirin EC 81 MG tablet Take 81 mg by mouth daily.      . carvedilol (COREG) 6.25 MG tablet Take 1 tablet (6.25 mg total) by mouth 2 (two) times daily.  180 tablet  3  . cloNIDine (CATAPRES) 0.1 MG tablet Take 1 tablet (0.1 mg total) by mouth 3 (three) times daily.  60 tablet  11  . Fluticasone-Salmeterol (ADVAIR) 250-50 MCG/DOSE AEPB Inhale 1 puff into the lungs every 12 (twelve) hours.      . furosemide (LASIX) 40 MG tablet Take 1 tablet (40 mg total) by mouth daily.  30 tablet  6  . guaiFENesin (MUCINEX) 600 MG 12 hr tablet Take 2 tablets (1,200 mg total)  by mouth 2 (two) times daily.  40 tablet  1  . ipratropium-albuterol (DUONEB) 0.5-2.5 (3) MG/3ML SOLN Take 3 mLs by nebulization every 6 (six) hours as needed (Shortness of breath).      . losartan (COZAAR) 100 MG tablet Take 0.5 tablets (50 mg total) by mouth daily.  30 tablet  6  . Multiple Vitamins-Minerals (MENS MULTIVITAMIN PLUS) TABS Take 1 tablet by mouth daily.       . nitroGLYCERIN (NITROSTAT) 0.4 MG SL tablet Place 1 tablet (0.4 mg total) under the tongue every 5 (five) minutes as needed for chest pain.  90 tablet  3  . omeprazole (PRILOSEC) 20 MG capsule Take 20 mg by mouth 2 (two) times daily.       . potassium chloride (K-DUR) 10 MEQ tablet Take 2 tablets (20 mEq total) by mouth daily.  60 tablet  6  . prasugrel (EFFIENT) 10 MG TABS Take 10 mg by mouth daily.      . predniSONE (DELTASONE) 20 MG tablet Take 1 tablet (20 mg total) by mouth daily.  10 tablet  0  . rosuvastatin (CRESTOR) 10 MG tablet Take 10 mg by mouth daily.      Marland Kitchen warfarin (COUMADIN) 2.5 MG tablet Take 1 tablet (2.5 mg total) by mouth daily.  30 tablet  3   No current facility-administered medications for this visit.    Past Medical  History  Diagnosis Date  . GERD (gastroesophageal reflux disease)   . COPD (chronic obstructive pulmonary disease)   . Myocardial infarction 05/02/12  . Chronic systolic heart failure   . Essential hypertension, benign   . Cardiomyopathy     LVEF 40-45%  . Right ventricular mural thrombus     On Coumadin  . Cor pulmonale     Severe RV dysfunction  . Coronary atherosclerosis of native coronary artery     DES to OM - [facility]    Social History Danny [name] reports that he quit smoking about 18 years ago. His smoking use included Cigarettes. He has a 50 pack-year smoking history. He has never used smokeless tobacco. Danny [name] reports that he does not drink alcohol.  Review of Systems Chronic stable NYHA class 2-3 dyspnea. Uses oxygen. No anginal chest pain. No  palpitations or syncope. Has mild, intermittent leg edema. States he uses compression meds usually. Otherwise negative.  Physical Examination Filed Vitals:   11/28/12 1129  BP: 127/83  Pulse: 104   Appears comfortable at rest wearing oxygen via [place]. HEENT: Conjunctiva and lids normal, oropharynx clear. Neck: Supple, no elevated JVP or carotid bruits, no thyromegaly. Lungs: Decreased breath sounds mainly upper lobes, nonlabored breathing at rest. Cardiac: Regular rate and rhythm, indistinct PMI, no S3 or significant systolic murmur, no pericardial rub. Abdomen: Soft, nontender, bowel sounds present. Extremities: 1+ edema, distal pulses 2+. Skin: Warm and dry. Musculoskeletal: No kyphosis. Neuropsychiatric: Alert and oriented x3, affect grossly appropriate.   Problem List and Plan   Coronary atherosclerosis of native coronary artery Symptomatically stable status post DES to the OM at an outside facility in February. We will stop aspirin since he remains on Effient and Coumadin combination. Followup arranged.  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 Continue current medical treatment.  RV (right ventricular) mural thrombus Now on Coumadin, followed through the Coumadin clinic. Patient has associated severe RV dysfunction likely a result of severe underlying lung disease/emphysema.  Essential hypertension, benign Blood pressure is well controlled today.  COPD (chronic obstructive pulmonary disease) Keep follow up with Dr. Delton [name].    Danny [name], M.D., F.A.C.C.

## 2012-11-28 NOTE — Assessment & Plan Note (Signed)
Blood pressure is well-controlled today. 

## 2012-11-28 NOTE — Assessment & Plan Note (Signed)
Symptomatically stable status post DES to the OM at an outside facility in February. We will stop aspirin since he remains on Effient and Coumadin combination. Followup arranged.

## 2012-11-28 NOTE — Assessment & Plan Note (Signed)
Now on Coumadin, followed through the Coumadin clinic. Patient has associated severe RV dysfunction likely a result of severe underlying lung disease/emphysema.

## 2012-12-05 ENCOUNTER — Ambulatory Visit (INDEPENDENT_AMBULATORY_CARE_PROVIDER_SITE_OTHER): Payer: Managed Care, Other (non HMO) | Admitting: *Deleted

## 2012-12-05 DIAGNOSIS — Z7901 Long term (current) use of anticoagulants: Secondary | ICD-10-CM

## 2012-12-05 DIAGNOSIS — I513 Intracardiac thrombosis, not elsewhere classified: Secondary | ICD-10-CM

## 2012-12-05 DIAGNOSIS — I219 Acute myocardial infarction, unspecified: Secondary | ICD-10-CM

## 2012-12-05 LAB — POCT INR: INR: 2.2

## 2012-12-11 ENCOUNTER — Telehealth: Payer: Self-pay | Admitting: Cardiology

## 2012-12-11 MED ORDER — WARFARIN SODIUM 2.5 MG PO TABS: 2.5000 mg | ORAL_TABLET | Freq: Every day | ORAL | Status: DC

## 2012-12-11 NOTE — Telephone Encounter (Signed)
rx sent to pharmacy by e-script PT AWARE 

## 2012-12-11 NOTE — Telephone Encounter (Signed)
Needs Warfarin quantity raised.  He is running out early and having to pay out of pocket due to it. / tgs

## 2012-12-19 ENCOUNTER — Ambulatory Visit (INDEPENDENT_AMBULATORY_CARE_PROVIDER_SITE_OTHER): Payer: Managed Care, Other (non HMO) | Admitting: *Deleted

## 2012-12-19 DIAGNOSIS — I219 Acute myocardial infarction, unspecified: Secondary | ICD-10-CM

## 2012-12-19 DIAGNOSIS — I513 Intracardiac thrombosis, not elsewhere classified: Secondary | ICD-10-CM

## 2012-12-19 DIAGNOSIS — Z7901 Long term (current) use of anticoagulants: Secondary | ICD-10-CM

## 2012-12-23 ENCOUNTER — Encounter (HOSPITAL_COMMUNITY): Payer: Self-pay | Admitting: *Deleted

## 2012-12-23 ENCOUNTER — Inpatient Hospital Stay (HOSPITAL_COMMUNITY)
Admission: EM | Admit: 2012-12-23 | Discharge: 2012-12-26 | DRG: 151 | Disposition: A | Discharge status: Home / Self Care | Payer: Managed Care, Other (non HMO) | Attending: Family Medicine | Admitting: Family Medicine

## 2012-12-23 DIAGNOSIS — I219 Acute myocardial infarction, unspecified: Secondary | ICD-10-CM

## 2012-12-23 DIAGNOSIS — K219 Gastro-esophageal reflux disease without esophagitis: Secondary | ICD-10-CM | POA: Diagnosis present

## 2012-12-23 DIAGNOSIS — I429 Cardiomyopathy, unspecified: Secondary | ICD-10-CM

## 2012-12-23 DIAGNOSIS — Z23 Encounter for immunization: Secondary | ICD-10-CM

## 2012-12-23 DIAGNOSIS — J439 Emphysema, unspecified: Secondary | ICD-10-CM | POA: Diagnosis present

## 2012-12-23 DIAGNOSIS — I5022 Chronic systolic (congestive) heart failure: Secondary | ICD-10-CM | POA: Diagnosis present

## 2012-12-23 DIAGNOSIS — I10 Essential (primary) hypertension: Secondary | ICD-10-CM | POA: Diagnosis present

## 2012-12-23 DIAGNOSIS — Z9981 Dependence on supplemental oxygen: Secondary | ICD-10-CM

## 2012-12-23 DIAGNOSIS — I2589 Other forms of chronic ischemic heart disease: Secondary | ICD-10-CM | POA: Diagnosis present

## 2012-12-23 DIAGNOSIS — R0902 Hypoxemia: Secondary | ICD-10-CM | POA: Diagnosis present

## 2012-12-23 DIAGNOSIS — T45515A Adverse effect of anticoagulants, initial encounter: Secondary | ICD-10-CM | POA: Diagnosis present

## 2012-12-23 DIAGNOSIS — I252 Old myocardial infarction: Secondary | ICD-10-CM

## 2012-12-23 DIAGNOSIS — J449 Chronic obstructive pulmonary disease, unspecified: Secondary | ICD-10-CM | POA: Diagnosis present

## 2012-12-23 DIAGNOSIS — I513 Intracardiac thrombosis, not elsewhere classified: Secondary | ICD-10-CM

## 2012-12-23 DIAGNOSIS — I2510 Atherosclerotic heart disease of native coronary artery without angina pectoris: Secondary | ICD-10-CM | POA: Diagnosis present

## 2012-12-23 DIAGNOSIS — E785 Hyperlipidemia, unspecified: Secondary | ICD-10-CM | POA: Diagnosis present

## 2012-12-23 DIAGNOSIS — R040 Epistaxis: Principal | ICD-10-CM | POA: Diagnosis present

## 2012-12-23 DIAGNOSIS — Z7901 Long term (current) use of anticoagulants: Secondary | ICD-10-CM

## 2012-12-23 DIAGNOSIS — Z87891 Personal history of nicotine dependence: Secondary | ICD-10-CM

## 2012-12-23 LAB — BASIC METABOLIC PANEL
Chloride: 98 mEq/L (ref 96–112)
Creatinine, Ser: 1.29 mg/dL (ref 0.50–1.35)
GFR calc Af Amer: 71 mL/min — ABNORMAL LOW (ref >90)
GFR calc non Af Amer: 61 mL/min — ABNORMAL LOW (ref >90)

## 2012-12-23 LAB — CBC WITH DIFFERENTIAL/PLATELET
Basophils Absolute: 0.1 10*3/uL (ref 0.0–0.1)
Eosinophils Absolute: 0.1 10*3/uL (ref 0.0–0.7)
Lymphocytes Relative: 29 % (ref 12–46)
Lymphs Abs: 4.1 10*3/uL — ABNORMAL HIGH (ref 0.7–4.0)
MCH: 32.2 pg (ref 26.0–34.0)
Neutrophils Relative %: 61 % (ref 43–77)
Platelets: 178 10*3/uL (ref 150–400)
RBC: 4.6 MIL/uL (ref 4.22–5.81)
RDW: 14.7 % (ref 11.5–15.5)
WBC: 14 10*3/uL — ABNORMAL HIGH (ref 4.0–10.5)

## 2012-12-23 LAB — PROTIME-INR
INR: 2.16 — ABNORMAL HIGH (ref 0.00–1.49)
Prothrombin Time: 23.4 seconds — ABNORMAL HIGH (ref 11.6–15.2)

## 2012-12-23 MED ORDER — FUROSEMIDE 40 MG PO TABS
40.0000 mg | ORAL_TABLET | Freq: Every day | ORAL | Status: DC
  Administered 2012-12-24 – 2012-12-26 (×3): 40 mg via ORAL
  Filled 2012-12-23 (×3): qty 1

## 2012-12-23 MED ORDER — ONDANSETRON HCL 4 MG/2ML IJ SOLN
4.0000 mg | Freq: Four times a day (QID) | INTRAMUSCULAR | Status: DC | PRN
  Administered 2012-12-24 – 2012-12-26 (×2): 4 mg via INTRAVENOUS
  Filled 2012-12-23 (×2): qty 2

## 2012-12-23 MED ORDER — CHLORHEXIDINE GLUCONATE 0.12 % MT SOLN
15.0000 mL | Freq: Two times a day (BID) | OROMUCOSAL | Status: DC
  Administered 2012-12-24 – 2012-12-25 (×3): 15 mL via OROMUCOSAL
  Filled 2012-12-23 (×3): qty 15

## 2012-12-23 MED ORDER — BIOTENE DRY MOUTH MT LIQD
15.0000 mL | Freq: Two times a day (BID) | OROMUCOSAL | Status: DC
  Administered 2012-12-24 – 2012-12-25 (×4): 15 mL via OROMUCOSAL

## 2012-12-23 MED ORDER — FUROSEMIDE 40 MG PO TABS: 40.0000 mg | ORAL_TABLET | Freq: Every day | ORAL | Status: DC

## 2012-12-23 MED ORDER — PRASUGREL HCL 10 MG PO TABS
10.0000 mg | ORAL_TABLET | Freq: Every day | ORAL | Status: DC
  Administered 2012-12-24 – 2012-12-26 (×3): 10 mg via ORAL
  Filled 2012-12-23 (×4): qty 1

## 2012-12-23 MED ORDER — INFLUENZA VAC SPLIT QUAD 0.5 ML IM SUSP
0.5000 mL | INTRAMUSCULAR | Status: AC
  Administered 2012-12-24: 0.5 mL via INTRAMUSCULAR
  Filled 2012-12-23: qty 0.5

## 2012-12-23 MED ORDER — ATORVASTATIN CALCIUM 20 MG PO TABS: 20.0000 mg | ORAL_TABLET | Freq: Every day | ORAL | Status: DC

## 2012-12-23 MED ORDER — MORPHINE SULFATE 4 MG/ML IJ SOLN
4.0000 mg | INTRAMUSCULAR | Status: DC | PRN
  Administered 2012-12-24 – 2012-12-26 (×6): 4 mg via INTRAVENOUS
  Filled 2012-12-23 (×6): qty 1

## 2012-12-23 MED ORDER — MOMETASONE FURO-FORMOTEROL FUM 100-5 MCG/ACT IN AERO
INHALATION_SPRAY | RESPIRATORY_TRACT | Status: AC
  Filled 2012-12-23: qty 8.8

## 2012-12-23 MED ORDER — PRASUGREL HCL 10 MG PO TABS: 10.0000 mg | ORAL_TABLET | Freq: Every day | ORAL | Status: DC

## 2012-12-23 MED ORDER — ONDANSETRON HCL 4 MG PO TABS
4.0000 mg | ORAL_TABLET | Freq: Four times a day (QID) | ORAL | Status: DC | PRN
  Administered 2012-12-24 – 2012-12-25 (×2): 4 mg via ORAL
  Filled 2012-12-23 (×2): qty 1

## 2012-12-23 MED ORDER — PANTOPRAZOLE SODIUM 40 MG PO TBEC
40.0000 mg | DELAYED_RELEASE_TABLET | Freq: Every day | ORAL | Status: DC
  Administered 2012-12-24 – 2012-12-26 (×3): 40 mg via ORAL
  Filled 2012-12-23 (×3): qty 1

## 2012-12-23 MED ORDER — WARFARIN SODIUM 2.5 MG PO TABS: 2.5000 mg | ORAL_TABLET | ORAL | Status: DC

## 2012-12-23 MED ORDER — ALBUTEROL SULFATE HFA 108 (90 BASE) MCG/ACT IN AERS: 2.0000 | INHALATION_SPRAY | Freq: Four times a day (QID) | RESPIRATORY_TRACT | Status: DC | PRN

## 2012-12-23 MED ORDER — ALBUTEROL SULFATE (5 MG/ML) 0.5% IN NEBU
2.5000 mg | INHALATION_SOLUTION | Freq: Four times a day (QID) | RESPIRATORY_TRACT | Status: DC | PRN
  Administered 2012-12-25 (×2): 2.5 mg via RESPIRATORY_TRACT
  Filled 2012-12-23 (×2): qty 0.5

## 2012-12-23 MED ORDER — MORPHINE SULFATE 4 MG/ML IJ SOLN
4.0000 mg | Freq: Once | INTRAMUSCULAR | Status: AC
  Administered 2012-12-23: 4 mg via INTRAVENOUS
  Filled 2012-12-23: qty 1

## 2012-12-23 MED ORDER — CARVEDILOL 3.125 MG PO TABS
6.2500 mg | ORAL_TABLET | Freq: Two times a day (BID) | ORAL | Status: DC
  Administered 2012-12-24 – 2012-12-25 (×4): 6.25 mg via ORAL
  Filled 2012-12-23: qty 2
  Filled 2012-12-23: qty 1
  Filled 2012-12-23: qty 2
  Filled 2012-12-23: qty 1
  Filled 2012-12-23 (×3): qty 2
  Filled 2012-12-23: qty 1
  Filled 2012-12-23: qty 2

## 2012-12-23 MED ORDER — IPRATROPIUM-ALBUTEROL 0.5-2.5 (3) MG/3ML IN SOLN: 3.0000 mL | Freq: Four times a day (QID) | RESPIRATORY_TRACT | Status: DC | PRN

## 2012-12-23 MED ORDER — IPRATROPIUM BROMIDE 0.02 % IN SOLN
0.5000 mg | Freq: Four times a day (QID) | RESPIRATORY_TRACT | Status: DC | PRN
  Administered 2012-12-25 (×2): 0.5 mg via RESPIRATORY_TRACT
  Filled 2012-12-23 (×2): qty 2.5

## 2012-12-23 MED ORDER — PANTOPRAZOLE SODIUM 40 MG PO TBEC: 40.0000 mg | DELAYED_RELEASE_TABLET | Freq: Every day | ORAL | Status: DC

## 2012-12-23 MED ORDER — ROSUVASTATIN CALCIUM 20 MG PO TABS
10.0000 mg | ORAL_TABLET | Freq: Every day | ORAL | Status: DC
  Administered 2012-12-24 – 2012-12-25 (×2): 10 mg via ORAL
  Filled 2012-12-23 (×3): qty 1

## 2012-12-23 MED ORDER — NITROGLYCERIN 0.4 MG SL SUBL: 0.4000 mg | SUBLINGUAL_TABLET | SUBLINGUAL | Status: DC | PRN

## 2012-12-23 MED ORDER — PREDNISONE 20 MG PO TABS
20.0000 mg | ORAL_TABLET | Freq: Every day | ORAL | Status: DC
  Administered 2012-12-23 – 2012-12-26 (×4): 20 mg via ORAL
  Filled 2012-12-23 (×4): qty 1

## 2012-12-23 MED ORDER — POTASSIUM CHLORIDE ER 10 MEQ PO TBCR
20.0000 meq | EXTENDED_RELEASE_TABLET | Freq: Every day | ORAL | Status: DC
  Administered 2012-12-23 – 2012-12-26 (×4): 20 meq via ORAL
  Filled 2012-12-23 (×9): qty 2

## 2012-12-23 MED ORDER — CLONIDINE HCL 0.1 MG PO TABS
0.1000 mg | ORAL_TABLET | Freq: Three times a day (TID) | ORAL | Status: DC
  Administered 2012-12-23 – 2012-12-25 (×7): 0.1 mg via ORAL
  Filled 2012-12-23 (×8): qty 1

## 2012-12-23 MED ORDER — MOMETASONE FURO-FORMOTEROL FUM 100-5 MCG/ACT IN AERO
2.0000 | INHALATION_SPRAY | Freq: Two times a day (BID) | RESPIRATORY_TRACT | Status: DC
  Administered 2012-12-24 – 2012-12-26 (×5): 2 via RESPIRATORY_TRACT
  Filled 2012-12-23: qty 8.8

## 2012-12-23 NOTE — H&P (Addendum)
[facility] History and Physical  Danny [name] WUJ:[id number] DOB: May 19, 1955 DOA: 12/23/2012  Referring physician: Dr. Rubin Payor, ER. PCP: Isabella Stalling, MD  Specialists: Pulmonology, Dr. Juanetta Gosling.  Chief Complaint: Epistaxis.  HPI: Danny [name] is a 57 y.o. male comes to the emergency room because he had a sudden spontaneous episode of right epistaxis which have been present for approximately 90 minutes before he came to the emergency room. This has been now controlled in the emergency room by nasal packing/balloon inflation. Unfortunately, he has become hypoxic. He normally is on 3-1/2-4 L per minute of oxygen at home and, presumably, do to blockage of one nostril, and poor respiratory capacity, he has become hypoxemic. He otherwise feels well. He does not feel particularly dyspneic at rest. He does not have a fever. There is no cough. 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 He also has cardiomyopathy with ejection fraction of 40-45%.   Review of Systems:  Apart from history of present illness, other systems are negative.  Past Medical History  Diagnosis Date  . GERD (gastroesophageal reflux disease)   . COPD (chronic obstructive pulmonary disease)   . Myocardial infarction 05/02/12  . Chronic systolic heart failure   . Essential hypertension, benign   . Cardiomyopathy     LVEF 40-45%  . Right ventricular mural thrombus     On Coumadin  . Cor pulmonale     Severe RV dysfunction  . Coronary atherosclerosis of native coronary artery     DES to OM - [facility]   Past Surgical History  Procedure Laterality Date  . Lung removal, partial Bilateral 04/26/2000    Bullectomy   Social History:  reports that he quit smoking about 18 years ago. His smoking use included Cigarettes. He has a 50 pack-year smoking history. He has never used smokeless tobacco. He reports  that he does not drink alcohol or use illicit drugs.   Allergies  Allergen Reactions  . Lipitor [Atorvastatin] Other (See Comments)    Muscle spasms     No family history on file. noncontributory.   Prior to Admission medications   Medication Sig Start Date End Date Taking? Authorizing Provider  carvedilol (COREG) 6.25 MG tablet Take 1 tablet (6.25 mg total) by mouth 2 (two) times daily. 10/18/12  Yes Jodelle Gross, NP  cloNIDine (CATAPRES) 0.1 MG tablet Take 1 tablet (0.1 mg total) by mouth 3 (three) times daily. 11/01/12  Yes Isabella Stalling, MD  Fluticasone-Salmeterol (ADVAIR) 250-50 MCG/DOSE AEPB Inhale 1 puff into the lungs every 12 (twelve) hours.   Yes Historical Provider, MD  furosemide (LASIX) 40 MG tablet Take 1 tablet (40 mg total) by mouth daily. 10/14/12  Yes Jodelle Gross, NP  ipratropium-albuterol (DUONEB) 0.5-2.5 (3) MG/3ML SOLN Take 3 mLs by nebulization every 6 (six) hours as needed (Shortness of breath). 05/10/12  Yes Leslye Peer, MD  omeprazole (PRILOSEC) 20 MG capsule Take 20 mg by mouth 2 (two) times daily.    Yes Historical Provider, MD  potassium chloride (K-DUR) 10 MEQ tablet Take 2 tablets (20 mEq total) by mouth daily. 10/18/12  Yes Jodelle Gross, NP  prasugrel (EFFIENT) 10 MG TABS Take 10 mg by mouth daily.   Yes Historical Provider, MD  predniSONE (DELTASONE) 20 MG tablet Take 1 tablet (20 mg total) by mouth daily. 11/01/12  Yes Isabella Stalling, MD  rosuvastatin (CRESTOR) 10 MG  tablet Take 10 mg by mouth daily.   Yes Historical Provider, MD  warfarin (COUMADIN) 2.5 MG tablet Take 2.5-3.75 mg by mouth See admin instructions. Takes 3.75 mg (1.5 tablets) for 3 days in the evening, then take 2.5mg  (1 tablet) on the fourth day, then repeat as directed per MD   Yes Historical Provider, MD  albuterol (PROVENTIL HFA;VENTOLIN HFA) 108 (90 BASE) MCG/ACT inhaler Inhale 2 puffs into the lungs every 6 (six) hours as needed for wheezing.    Historical Provider,  MD  azithromycin (ZITHROMAX) 250 MG tablet Take 250-500 mg by mouth See admin instructions. Take two tablets on day 1, then take one tablet on days 2 through 5 12/11/12   Historical Provider, MD  nitroGLYCERIN (NITROSTAT) 0.4 MG SL tablet Place 1 tablet (0.4 mg total) under the tongue every 5 (five) minutes as needed for chest pain. 09/18/12   Wendall Stade, MD   Physical Exam: Filed Vitals:   12/23/12 2118  BP: 145/101  Pulse: 115  Temp:   Resp: 28     General:  He looks systemically well. He does not appear to be in respiratory distress at rest.  Eyes: No pallor. No jaundice.  ENT: Balloon in the right nostril in situ.  Neck: No lymphadenopathy.  Cardiovascular: Heart sounds are present in sinus rhythm without murmurs or added sounds.  Respiratory: Lung fields are clinically clear with poor air entry bilaterally from his severe emphysema.  Abdomen: Soft, nontender.  Skin: No rash.  Musculoskeletal: No acute joint abnormalities.  Psychiatric: Appropriate affect.  Neurologic: Alert and orientated without any focal neurological signs.  Labs on Admission:  Basic Metabolic Panel:  Recent Labs Lab 12/23/12 1955  NA 143  K 3.3*  CL 98  CO2 38*  GLUCOSE 132*  BUN 26*  CREATININE 1.29  CALCIUM 9.7       CBC:  Recent Labs Lab 12/23/12 1955  WBC 14.0*  NEUTROABS 8.5*  HGB 14.8  HCT 46.4  MCV 100.9*  PLT 178      BNP (last 3 results)  Recent Labs  10/02/12 1015 10/23/12 1055  PROBNP 2474.00* 2088.0*      Radiological Exams on Admission:     Assessment/Plan   1. Epistaxis, spontaneous. Bleeding has been controlled for the time being. 2. COPD and severe emphysematous lung disease, status post bilateral bullectomy. 3.  Right ventricular mural thrombus, on long-term anticoagulation. 4. Hypertension. 5. Cardiomyopathy, ejection fraction 40-45%.  Plan: 1. Admit to step down unit as he is on BiPAP at night. 2. Monitor overnight. 3. If  stable tomorrow, consider weaning oxygen requirements and he will need to see ENT surgeon soon, we'll request consultation. Hold Coumadin for the time being.  Further recommendations will depend on patient's hospital progress.  Code Status: Full code.  Family Communication: Discussed plan with patient at the bedside.   Disposition Plan: Home when medically stable.  Time spent: 60 minutes.  Wilson Singer [facility] Pager [phone].  If 7PM-7AM, please contact night-coverage [email or web address] Password [facility] 12/23/2012, 10:02 PM

## 2012-12-23 NOTE — ED Notes (Signed)
Patient states at this time his legs feel numb and weird. No complaint of any pain else where.

## 2012-12-23 NOTE — ED Notes (Signed)
Patient placed on nasal cannula per pickering,md.

## 2012-12-23 NOTE — ED Notes (Signed)
Bleeding is now controlled to right nare, Left nare is now bleeding after pt coughed. Pt coughed up a clot of blood. Pt denies this ever happening in the past.

## 2012-12-23 NOTE — ED Notes (Signed)
Right nare actively bleeding again.  More gauze provided.  edp at bedside.

## 2012-12-23 NOTE — ED Provider Notes (Addendum)
CSN: [id number]     Arrival date & time 12/23/12  1746 History  This chart was scribed for [employer]. Rubin Payor, MD by Quintella Reichert, ED scribe.  This patient was seen in room APA12/APA12 and the patient's care was started at 6:38 PM.   Chief Complaint  Patient presents with  . Epistaxis    The history is provided by the patient. No language interpreter was used.    HPI Comments: Danny [name] is a 57 y.o. male with h/o right ventricular mural thrombus (on coumadin), chronic systolic heart failure, COPD, MI, cardiomyopathy and HTN who presents to the Emergency Department complaining of 1 1/2 hours of persistent bleeding from the right nare.  Pt states that he has also been gradually growing lightheaded since bleeding began.  He denies trauma and denies prior h/o similar symptoms.  He denies bleeding from any other area.  He denies CP or SOB.  He last had INR check 5 days ago and it was only slightly elevated at that time.  Pt is on oxygen at all times at 3.5-4 liters.    Past Medical History  Diagnosis Date  . GERD (gastroesophageal reflux disease)   . COPD (chronic obstructive pulmonary disease)   . Myocardial infarction 05/02/12  . Chronic systolic heart failure   . Essential hypertension, benign   . Cardiomyopathy     LVEF 40-45%  . Right ventricular mural thrombus     On Coumadin  . Cor pulmonale     Severe RV dysfunction  . Coronary atherosclerosis of native coronary artery     DES to OM - [facility]    Past Surgical History  Procedure Laterality Date  . Lung removal, partial Bilateral 04/26/2000    Bullectomy    No family history on file.   History  Substance Use Topics  . Smoking status: Former Smoker -- 2.00 packs/day for 25 years    Types: Cigarettes    Quit date: 04/03/1994  . Smokeless tobacco: Never Used  . Alcohol Use: No     Review of Systems  HENT: Positive for nosebleeds.   Respiratory: Negative for shortness of breath.    Cardiovascular: Negative for chest pain.  Neurological: Positive for light-headedness.  All other systems reviewed and are negative.     Allergies  Lipitor  Home Medications   Current Outpatient Rx  Name  Route  Sig  Dispense  Refill  . carvedilol (COREG) 6.25 MG tablet   Oral   Take 1 tablet (6.25 mg total) by mouth 2 (two) times daily.   180 tablet   3   . cloNIDine (CATAPRES) 0.1 MG tablet   Oral   Take 1 tablet (0.1 mg total) by mouth 3 (three) times daily.   60 tablet   11   . Fluticasone-Salmeterol (ADVAIR) 250-50 MCG/DOSE AEPB   Inhalation   Inhale 1 puff into the lungs every 12 (twelve) hours.         . furosemide (LASIX) 40 MG tablet   Oral   Take 1 tablet (40 mg total) by mouth daily.   30 tablet   6   . ipratropium-albuterol (DUONEB) 0.5-2.5 (3) MG/3ML SOLN   Nebulization   Take 3 mLs by nebulization every 6 (six) hours as needed (Shortness of breath).         Marland Kitchen omeprazole (PRILOSEC) 20 MG capsule   Oral   Take 20 mg by mouth 2 (two) times daily.          Marland Kitchen  potassium chloride (K-DUR) 10 MEQ tablet   Oral   Take 2 tablets (20 mEq total) by mouth daily.   60 tablet   6   . prasugrel (EFFIENT) 10 MG TABS   Oral   Take 10 mg by mouth daily.         . predniSONE (DELTASONE) 20 MG tablet   Oral   Take 1 tablet (20 mg total) by mouth daily.   10 tablet   0   . rosuvastatin (CRESTOR) 10 MG tablet   Oral   Take 10 mg by mouth daily.         Marland Kitchen warfarin (COUMADIN) 2.5 MG tablet   Oral   Take 2.5-3.75 mg by mouth See admin instructions. Takes 3.75 mg (1.5 tablets) for 3 days in the evening, then take 2.5mg  (1 tablet) on the fourth day, then repeat as directed per MD         . albuterol (PROVENTIL HFA;VENTOLIN HFA) 108 (90 BASE) MCG/ACT inhaler   Inhalation   Inhale 2 puffs into the lungs every 6 (six) hours as needed for wheezing.         Marland Kitchen azithromycin (ZITHROMAX) 250 MG tablet   Oral   Take 250-500 mg by mouth See admin  instructions. Take two tablets on day 1, then take one tablet on days 2 through 5         . nitroGLYCERIN (NITROSTAT) 0.4 MG SL tablet   Sublingual   Place 1 tablet (0.4 mg total) under the tongue every 5 (five) minutes as needed for chest pain.   90 tablet   3    BP 190/150  Pulse 117  Temp(Src) 98.2 F (36.8 C) (Oral)  Resp 22  SpO2 96%  Physical Exam  Nursing note and vitals reviewed. Constitutional: He is oriented to person, place, and time. He appears well-developed and well-nourished. No distress.  HENT:  Head: Normocephalic and atraumatic.  Blood coming out of right nare  Eyes: EOM are normal.  Neck: Neck supple. No tracheal deviation present.  Cardiovascular: Normal rate, regular rhythm and normal heart sounds.   No murmur heard. Pulmonary/Chest: Effort normal and breath sounds normal. No respiratory distress. He has no wheezes. He has no rales.  Musculoskeletal: Normal range of motion.  Neurological: He is alert and oriented to person, place, and time.  Skin: Skin is warm and dry.  Psychiatric: He has a normal mood and affect. His behavior is normal.    ED Course  EPISTAXIS MANAGEMENT Date/Time: 12/23/2012 9:45 PM Performed by: Benjiman Core R. Authorized by: Billee Cashing Consent: Verbal consent obtained. written consent not obtained. Risks and benefits: risks, benefits and alternatives were discussed Consent given by: patient Patient understanding: patient states understanding of the procedure being performed Relevant documents: relevant documents present and verified Site marked: the operative site was marked Required items: required blood products, implants, devices, and special equipment available Patient identity confirmed: verbally with patient and arm band Patient sedated: no Treatment site: right anterior Repair method: nasal balloon Post-procedure assessment: bleeding decreased Treatment complexity: simple Recurrence: recurrence of  recent bleed Comments: Patient required increased air into the nasal balloon.   (including critical care time)  DIAGNOSTIC STUDIES: Oxygen Saturation is 96% on room air, normal by my interpretation.    COORDINATION OF CARE: 6:43 PM-Discussed treatment plan with pt at bedside and pt agreed to plan.    Results for orders placed during the hospital encounter of 12/23/12  CBC WITH DIFFERENTIAL  Result Value Range   WBC 14.0 (*) 4.0 - 10.5 K/uL   RBC 4.60  4.22 - 5.81 MIL/uL   Hemoglobin 14.8  13.0 - 17.0 g/dL   HCT 69.6  29.5 - 28.4 %   MCV 100.9 (*) 78.0 - 100.0 fL   MCH 32.2  26.0 - 34.0 pg   MCHC 31.9  30.0 - 36.0 g/dL   RDW 13.2  44.0 - 10.2 %   Platelets 178  150 - 400 K/uL   Neutrophils Relative % 61  43 - 77 %   Neutro Abs 8.5 (*) 1.7 - 7.7 K/uL   Lymphocytes Relative 29  12 - 46 %   Lymphs Abs 4.1 (*) 0.7 - 4.0 K/uL   Monocytes Relative 9  3 - 12 %   Monocytes Absolute 1.2 (*) 0.1 - 1.0 K/uL   Eosinophils Relative 1  0 - 5 %   Eosinophils Absolute 0.1  0.0 - 0.7 K/uL   Basophils Relative 0  0 - 1 %   Basophils Absolute 0.1  0.0 - 0.1 K/uL  PROTIME-INR      Result Value Range   Prothrombin Time 23.4 (*) 11.6 - 15.2 seconds   INR 2.16 (*) 0.00 - 1.49  BASIC METABOLIC PANEL      Result Value Range   Sodium 143  135 - 145 mEq/L   Potassium 3.3 (*) 3.5 - 5.1 mEq/L   Chloride 98  96 - 112 mEq/L   CO2 38 (*) 19 - 32 mEq/L   Glucose, Bld 132 (*) 70 - 99 mg/dL   BUN 26 (*) 6 - 23 mg/dL   Creatinine, Ser 7.25  0.50 - 1.35 mg/dL   Calcium 9.7  8.4 - 36.6 mg/dL   GFR calc non Af Amer 61 (*) >90 mL/min   GFR calc Af Amer 71 (*) >90 mL/min     MDM   1. Anterior epistaxis   2. COPD (chronic obstructive pulmonary disease)   3. Long term (current) use of anticoagulants   4. Hypoxia    Patient presents with epistaxis from his right nose. He was improved with packing. However he is on chronic oxygen and was only option going in one nostril he is having episodes of  hypoxia. He'll be admitted to internal medicine    I personally performed the services described in this documentation, which was scribed in my presence. The recorded information has been reviewed and is accurate.    Juliet Rude. Rubin Payor, MD 12/23/12 2145    Juliet Rude. Rubin Payor, MD 12/23/12 2146

## 2012-12-23 NOTE — ED Notes (Signed)
Pt states bleeding from right nare x 45 minutes PTA. Pt is on coumadin. Pt is hypertensive today and states that his PMD has taken him off of BP meds. NAD

## 2012-12-24 ENCOUNTER — Encounter: Payer: Self-pay | Admitting: Cardiology

## 2012-12-24 DIAGNOSIS — I2510 Atherosclerotic heart disease of native coronary artery without angina pectoris: Secondary | ICD-10-CM

## 2012-12-24 LAB — CBC
HCT: 46.5 % (ref 39.0–52.0)
MCH: 32.5 pg (ref 26.0–34.0)
MCV: 100 fL (ref 78.0–100.0)
Platelets: 189 10*3/uL (ref 150–400)
RBC: 4.65 MIL/uL (ref 4.22–5.81)
RDW: 14.3 % (ref 11.5–15.5)
WBC: 11.5 10*3/uL — ABNORMAL HIGH (ref 4.0–10.5)

## 2012-12-24 LAB — COMPREHENSIVE METABOLIC PANEL
AST: 15 U/L (ref 0–37)
Albumin: 3.5 g/dL (ref 3.5–5.2)
Alkaline Phosphatase: 53 U/L (ref 39–117)
BUN: 25 mg/dL — ABNORMAL HIGH (ref 6–23)
CO2: 40 mEq/L (ref 19–32)
Calcium: 9.6 mg/dL (ref 8.4–10.5)
Chloride: 99 mEq/L (ref 96–112)
Creatinine, Ser: 1.13 mg/dL (ref 0.50–1.35)
GFR calc Af Amer: 83 mL/min — ABNORMAL LOW (ref >90)
GFR calc non Af Amer: 71 mL/min — ABNORMAL LOW (ref >90)
Potassium: 4 mEq/L (ref 3.5–5.1)
Total Bilirubin: 0.8 mg/dL (ref 0.3–1.2)
Total Protein: 6.8 g/dL (ref 6.0–8.3)

## 2012-12-24 LAB — PROTIME-INR
INR: 1.8 — ABNORMAL HIGH (ref 0.00–1.49)
Prothrombin Time: 20.4 seconds — ABNORMAL HIGH (ref 11.6–15.2)

## 2012-12-24 MED ORDER — RAMIPRIL 2.5 MG PO CAPS
5.0000 mg | ORAL_CAPSULE | Freq: Every day | ORAL | Status: DC
  Administered 2012-12-24 – 2012-12-25 (×2): 5 mg via ORAL
  Filled 2012-12-24 (×3): qty 2

## 2012-12-24 NOTE — Progress Notes (Signed)
Patient unable to wear BIPAP tonight due to nasal packing and sore nose. ENT consult pending, then reassess for BIPAP usage.

## 2012-12-24 NOTE — Progress Notes (Signed)
UR chart review completed.  

## 2012-12-24 NOTE — Progress Notes (Signed)
ANTICOAGULATION CONSULT NOTE - Initial Consult  Pharmacy Consult for Coumadin (chronic PTA) and currently on hold due to admission for epistaxis.  Indication: h/o right ventricular thrombus  Allergies  Allergen Reactions  . Lipitor [Atorvastatin] Other (See Comments)    Muscle spasms     Patient Measurements: Height: 5\' 7"  (170.2 cm) Weight: 163 lb 12.8 oz (74.3 kg) IBW/kg (Calculated) : 66.1  Vital Signs: Temp: 97.6 F (36.4 C) (09/23 0801) Temp src: Oral (09/23 0801) BP: 135/106 mmHg (09/23 1044) Pulse Rate: 90 (09/23 0800)  Labs:  Recent Labs  12/23/12 1955 12/24/12 0415  HGB 14.8 15.1  HCT 46.4 46.5  PLT 178 189  LABPROT 23.4* 20.4*  INR 2.16* 1.80*  CREATININE 1.29 1.13    Estimated Creatinine Clearance: 69.1 ml/min (by C-G formula based on Cr of 1.13).   Medical History: Past Medical History  Diagnosis Date  . GERD (gastroesophageal reflux disease)   . COPD (chronic obstructive pulmonary disease)   . Myocardial infarction 05/02/12  . Chronic systolic heart failure   . Essential hypertension, benign   . Cardiomyopathy     LVEF 40-45%  . Right ventricular mural thrombus     On Coumadin  . Cor pulmonale     Severe RV dysfunction  . Coronary atherosclerosis of native coronary artery     DES to OM - [facility]    Medications:  Prescriptions prior to admission  Medication Sig Dispense Refill  . carvedilol (COREG) 6.25 MG tablet Take 1 tablet (6.25 mg total) by mouth 2 (two) times daily.  180 tablet  3  . cloNIDine (CATAPRES) 0.1 MG tablet Take 1 tablet (0.1 mg total) by mouth 3 (three) times daily.  60 tablet  11  . Fluticasone-Salmeterol (ADVAIR) 250-50 MCG/DOSE AEPB Inhale 1 puff into the lungs every 12 (twelve) hours.      . furosemide (LASIX) 40 MG tablet Take 1 tablet (40 mg total) by mouth daily.  30 tablet  6  . ipratropium-albuterol (DUONEB) 0.5-2.5 (3) MG/3ML SOLN Take 3 mLs by nebulization every 6 (six) hours as needed (Shortness  of breath).      Marland Kitchen omeprazole (PRILOSEC) 20 MG capsule Take 20 mg by mouth 2 (two) times daily.       . potassium chloride (K-DUR) 10 MEQ tablet Take 2 tablets (20 mEq total) by mouth daily.  60 tablet  6  . prasugrel (EFFIENT) 10 MG TABS Take 10 mg by mouth daily.      . predniSONE (DELTASONE) 20 MG tablet Take 1 tablet (20 mg total) by mouth daily.  10 tablet  0  . rosuvastatin (CRESTOR) 10 MG tablet Take 10 mg by mouth daily.      Marland Kitchen warfarin (COUMADIN) 2.5 MG tablet Take 2.5-3.75 mg by mouth See admin instructions. Takes 3.75 mg (1.5 tablets) for 3 days in the evening, then take 2.5mg  (1 tablet) on the fourth day, then repeat as directed per MD      . albuterol (PROVENTIL HFA;VENTOLIN HFA) 108 (90 BASE) MCG/ACT inhaler Inhale 2 puffs into the lungs every 6 (six) hours as needed for wheezing.      Marland Kitchen azithromycin (ZITHROMAX) 250 MG tablet Take 250-500 mg by mouth See admin instructions. Take two tablets on day 1, then take one tablet on days 2 through 5      . nitroGLYCERIN (NITROSTAT) 0.4 MG SL tablet Place 1 tablet (0.4 mg total) under the tongue every 5 (five) minutes as needed for chest pain.  90 tablet  3    Assessment: 57yo male who presented to the ED because of sudden, spontaneous episode of right epistaxis.  Bleeding was eventually controlled in ED by nasal packing/balloon inflation.  INR was therapeutic on admission but is now slightly below goal due to Coumadin being on hold.  Plan is for ENT to evaluate patient.  Pt is also on Effient.  Goal of Therapy:  INR 2-3 when Coumadin resumed Monitor platelets by anticoagulation protocol: Yes   Plan:  Hold Coumadin until evaluated by ENT INR daily  Danny [name] A 12/24/2012,1:00 PM

## 2012-12-24 NOTE — Consult Note (Signed)
CARDIOLOGY CONSULT NOTE   Patient ID: Danny [name] MRN: [id number] DOB/AGE: 1955-10-02 57 y.o.  Admit Date: 12/23/2012 Referring Physician: Janna Arch MD Primary Physician: Isabella Stalling, MD Consulting Cardiologist: Prentice Docker MD Primary Cardiologist: Nona Dell MD  Reason for Consultation: Recommendations for anticoagulation with epistaxis (on both Coumadin and Effient).  Clinical Summary 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 After packing to the nostril, inflation of nasal balloon,and being placed on O2, he became hypoxic and therefore admitted. He admits to being very anxious and hyperventilating during nasal packing procedure.  We are asked for cardiology recommendations for anticoagulation treatment in this setting as he is on both coumadin and Effient.    The patient states that he notices that his nose begins to bleed shortly after his albuterol treatments. He occasionally blows his nose and notices some blood clots. On day of admission when he blew his nose a large blood clot came from his right naris. Afterwards, the bleeding began. He denies any tarry stools or changes in his bowel habits indicating long-term bleeding prior to this event. Patient is followed in our office in the Coumadin clinic has had a recent INR completed to the level of 3.3, he was told to hold the Coumadin x1 day.    Hemoglobin on arrival was 14.8 with a hematocrit 46.4. Potassium initially was 3.3 and was replaced with a followup BMET revealing a potassium of 4.0. INR on admission 2.16. He is currently been taken off coumadin, but remains on Effient.     The patient also states, last week he gained approximately 6 pounds. The patient increased his  Lasix to twice a day for 2 days and weight returned to normal. Patient admitted to eating some salty foods which precipitated fluid retention.   Of note, in pulmonology has sent his records to [facility] to be considered for a lung transplant. They are awaiting appointment to meet with the transplant team.   Allergies  Allergen Reactions  . Lipitor [Atorvastatin] Other (See Comments)    Muscle spasms     Medications Scheduled Medications: . antiseptic oral rinse  15 mL Mouth Rinse q12n4p  . carvedilol  6.25 mg Oral BID WC  . chlorhexidine  15 mL Mouth Rinse BID  . cloNIDine  0.1 mg Oral TID  . furosemide  40 mg Oral Daily  . influenza vac split quadrivalent PF  0.5 mL Intramuscular Tomorrow-1000  . mometasone-formoterol  2 puff Inhalation BID  . pantoprazole  40 mg Oral Daily  . potassium chloride  20 mEq Oral Daily  . prasugrel  10 mg Oral Daily  . predniSONE  20 mg Oral Daily  . ramipril  5 mg Oral Daily  . rosuvastatin  10 mg Oral q1800     Infusions:     PRN Medications:  albuterol, albuterol, ipratropium, morphine injection, nitroGLYCERIN, ondansetron (ZOFRAN) IV, ondansetron   Past Medical History  Diagnosis Date  . GERD (gastroesophageal reflux disease)   . COPD (chronic obstructive pulmonary disease)   . Myocardial infarction 05/02/12  . Chronic systolic heart failure   . Essential hypertension, benign   . Cardiomyopathy     LVEF 40-45%  . Right ventricular mural thrombus     On Coumadin  .  Cor pulmonale     Severe RV dysfunction  . Coronary atherosclerosis of native coronary artery     DES to OM - [facility]    Past Surgical History  Procedure Laterality Date  . 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 No pertinent family history.  Social History [name] reports that he quit smoking about 18 years ago. His smoking use included Cigarettes. He has a 50 pack-year smoking history. He has never used  smokeless tobacco. [name] reports that he does not drink alcohol.  Review of Systems Otherwise reviewed and negative except as outlined.  Physical Examination Blood pressure 147/104, pulse 80, temperature 97.6 F (36.4 C), temperature source Oral, resp. rate 17, height 5\' 7"  (1.702 m), weight 163 lb 12.8 oz (74.3 kg), SpO2 99.00%.  Intake/Output Summary (Last 24 hours) at 12/24/12 0843 Last data filed at 12/24/12 0827  Gross per 24 hour  Intake    120 ml  Output    850 ml  Net   -730 ml     HEENT: Conjunctiva and lids normal, oropharynx clear with moist mucosa. Balloon to right nares. Neck: Supple, no elevated JVP or carotid bruits, no thyromegaly. Lungs: Clear to auscultation, nonlabored breathing at rest. Cardiac: Regular rate and rhythm, no S3 or significant systolic murmur, no pericardial rub. Abdomen: Soft, nontender, no hepatomegaly, bowel sounds present, no guarding or rebound. Extremities: No pitting edema, distal pulses 2+. Skin: Warm and dry. Musculoskeletal: No kyphosis. Neuropsychiatric: Alert and oriented x3, affect grossly appropriate.  Prior Cardiac Testing/Procedures  1. Echocardiogram 10/2012 Left ventricle: There was moderate concentric hypertrophy. Systolic function was mildly to moderately reduced. The estimated ejection fraction was in the range of 40% to 45%. There was an increased relative contribution of atrial contraction to ventricular filling. Doppler parameters are consistent with abnormal left ventricular relaxation (grade 1 diastolic dysfunction). Doppler parameters are consistent with high ventricular filling pressure. - Ventricular septum: The contour showed diastolic flattening and systolic flattening, indicative of right ventricular volume and pressure overload. - Right ventricle: Right ventricular systolic function is severely reduced. There is a homogeneous mass in the right ventricular apex, with a high suspicion for thrombus.  * The results were communicated with [name], NP. The cavity size was severely dilated. - Right atrium: The atrium was moderately to severely dilated.    Lab Results  Basic Metabolic Panel:  Recent Labs Lab 12/23/12 1955 12/24/12 0415  NA 143 141  K 3.3* 4.0  CL 98 99  CO2 38* 40*  GLUCOSE 132* 140*  BUN 26* 25*  CREATININE 1.29 1.13  CALCIUM 9.7 9.6    Liver Function Tests:  Recent Labs Lab 12/24/12 0415  AST 15  ALT 31  ALKPHOS 53  BILITOT 0.8  PROT 6.8  ALBUMIN 3.5    CBC:  Recent Labs Lab 12/23/12 1955 12/24/12 0415  WBC 14.0* 11.5*  NEUTROABS 8.5*  --   HGB 14.8 15.1  HCT 46.4 46.5  MCV 100.9* 100.0  PLT 178 189    ECG: Pending   Impression and Recommendations:  1. Acute Epistaxis of the Right Nare: Patient states this occurred over a hour and a half . After albuterol treatment. Had a blood clot enhanced right nare and removed it, bleeding began. He states that he is occasionally been blowing out blood clots from his nares bilaterally. He states that this is usually occurring around albuterol treatments. The patient is on Coumadin, and Effient.  Coumadin has been discontinued currently. This been no further bleeding. He was not found to be anemic, thrombocytopenic, or having history of abnormal stools.    ACC guidelines recommend 1 year of dual antiplatelet therapy after drug-eluting stent. However, with use of Coumadin aspirin has been discontinued in August  of 2014. Currently Coumadin is now on hold due to epistaxis.   2. RV thrombus: This was found on echocardiogram in July of 2014. The patient was placed on Coumadin at that time. On last visit with our Coumadin clinic the patient's INR was found to be mildly elevated at 3.3. Coumadin was held x1 day. Discuss further with Dr. Beulah Gandy concerning the need to continue Coumadin in this setting, repeating echocardiogram for further evaluation, possibly TEE.   3. CAD with ischemic CM:   Patient has an EF of 45%, with known history of drug-eluting stent to the OM in February 2014. This was placed at an outside facility. He has had some episodes of fluid retention over the last week for which he has taken extra doses of Lasix, with return to normal weight. He admits to eating some salty foods precipitating event. This down to be mildly hypokalemic on admission, this was repleted. He remains on ACE inhibitor, statin, lasix 40 mg daily, and carvedilol 6.25 mg twice a day. Blood pressure slightly elevated today, with diastolic elevation. The patient has not yet received his medications prior to recordings.  4. COPD: Sever bullous emphysema: The patient is being evaluated at [facility] for consideration for lung transplant.      Signed: Bettey Mare. Lyman Bishop NP [facility] 12/24/2012, 8:43 AM Co-Sign MD

## 2012-12-24 NOTE — Progress Notes (Signed)
[id number] 

## 2012-12-24 NOTE — Care Management Note (Addendum)
    Page 1 of 1   12/26/2012     1:10:12 PM   CARE MANAGEMENT NOTE 12/26/2012  Patient:  Danny [name], Danny [name]   Account Number:  [id number]  Date Initiated:  12/24/2012  Documentation initiated by:  Sharrie Rothman  Subjective/Objective Assessment:   Pt admitted from home with epistaxis and hypoxia. Pt lives with his wife and will return home at discharge. Pt is independent with ADL's. Pt has home O2 and neb machine from [facility].     Action/Plan:   No CM needs noted.   Anticipated DC Date:  12/26/2012   Anticipated DC Plan:  HOME/SELF CARE      DC Planning Services  CM consult      Choice offered to / List presented to:             Status of service:  Completed, signed off Medicare Important Message given?   (If response is "NO", the following Medicare IM given date fields will be blank) Date Medicare IM given:   Date Additional Medicare IM given:    Discharge Disposition:  HOME/SELF CARE  Per UR Regulation:    If discussed at Long Length of Stay Meetings, dates discussed:    Comments:  12/26/12 1310 Arlyss Queen, RN BSN CM Pt discharged home today. No CM needs noted.  12/24/12 1500 Arlyss Queen, RN BSN CM

## 2012-12-24 NOTE — Consult Note (Signed)
The patient was seen and examined, and I agree with the assessment and plan as documented above, with some modifications. Given that the patient had a DES to the OM in 2/14, he requires Effient. In addition to this, given that there has been a previously documented RV thrombus, he requires warfarin as well. Even if a repeat echocardiogram demonstrates resolution of the thrombus, because his RV systolic function is severely reduced in the setting of severe bullous emphysema, he would be at high risk for the recurrent development of an RV thrombus, putting him at exceedingly high risk for a pulmonary embolism, which could be catastrophic. I would recommend ENT consultation for possible cauterization of a bleeding vessel(s). The patient and his wife are understanding and in agreement with this plan.

## 2012-12-24 NOTE — Plan of Care (Signed)
Problem: Consults Goal: General Medical Patient Education See Patient Education Module for specific education. Outcome: Progressing Admitted with epistaxis, was at home cleaning his nose and it started to bleed and would not stop.  Problem: Phase I Progression Outcomes Goal: Pain controlled with appropriate interventions Outcome: Progressing Morphine being given for pain Q4 hrs. Goal: OOB as tolerated unless otherwise ordered Outcome: Progressing Patient is steady and ambulatory Goal: Initial discharge plan identified Outcome: Progressing To home with spouse  Goal: Hemodynamically stable Outcome: Progressing Hypertension, medication given

## 2012-12-24 NOTE — Progress Notes (Signed)
Per previous shift, the ENT contacts never called back to set up a consult. I contacted ENT on call (Dr. Jearld Fenton) to set up an ENT consult. He stated that pt would have to be transferred to [facility] for an ENT consult. He also stated that Dr. Suszanne Conners is the MD that sees ENT patients at [facility], and he has been added to the treatment team.  His phone # is [phone] if further contact is needed.

## 2012-12-25 ENCOUNTER — Ambulatory Visit: Payer: Managed Care, Other (non HMO) | Admitting: Emergency Medicine

## 2012-12-25 LAB — CBC
HCT: 41.9 % (ref 39.0–52.0)
Hemoglobin: 13.5 g/dL (ref 13.0–17.0)
MCH: 32.4 pg (ref 26.0–34.0)
MCHC: 32.2 g/dL (ref 30.0–36.0)
MCV: 100.5 fL — ABNORMAL HIGH (ref 78.0–100.0)
RBC: 4.17 MIL/uL — ABNORMAL LOW (ref 4.22–5.81)

## 2012-12-25 LAB — PROTIME-INR: INR: 1.84 — ABNORMAL HIGH (ref 0.00–1.49)

## 2012-12-25 MED ORDER — ALBUTEROL SULFATE (5 MG/ML) 0.5% IN NEBU
2.5000 mg | INHALATION_SOLUTION | Freq: Three times a day (TID) | RESPIRATORY_TRACT | Status: DC
  Administered 2012-12-26 (×2): 2.5 mg via RESPIRATORY_TRACT
  Filled 2012-12-25 (×2): qty 0.5

## 2012-12-25 MED ORDER — IPRATROPIUM BROMIDE 0.02 % IN SOLN
0.5000 mg | Freq: Three times a day (TID) | RESPIRATORY_TRACT | Status: DC
  Administered 2012-12-26 (×2): 0.5 mg via RESPIRATORY_TRACT
  Filled 2012-12-25 (×2): qty 2.5

## 2012-12-25 MED ORDER — WARFARIN - PHARMACIST DOSING INPATIENT: Freq: Every day | Status: DC

## 2012-12-25 NOTE — Progress Notes (Signed)
ANTICOAGULATION CONSULT NOTE   Pharmacy Consult for Coumadin (chronic PTA) and currently on hold due to admission for epistaxis.  Indication: h/o right ventricular thrombus  Allergies  Allergen Reactions  . Lipitor [Atorvastatin] Other (See Comments)    Muscle spasms    Patient Measurements: Height: 5\' 7"  (170.2 cm) Weight: 163 lb 12.8 oz (74.3 kg) IBW/kg (Calculated) : 66.1  Vital Signs: Temp: 98.3 F (36.8 C) (09/24 0500) Temp src: Oral (09/24 0500) BP: 100/69 mmHg (09/24 0900) Pulse Rate: 86 (09/24 0900)  Labs:  Recent Labs  12/23/12 1955 12/24/12 0415 12/25/12 0504  HGB 14.8 15.1 13.5  HCT 46.4 46.5 41.9  PLT 178 189 197  LABPROT 23.4* 20.4* 20.7*  INR 2.16* 1.80* 1.84*  CREATININE 1.29 1.13  --     Estimated Creatinine Clearance: 69.1 ml/min (by C-G formula based on Cr of 1.13).  Medical History: Past Medical History  Diagnosis Date  . GERD (gastroesophageal reflux disease)   . COPD (chronic obstructive pulmonary disease)   . Myocardial infarction 05/02/12  . Chronic systolic heart failure   . Essential hypertension, benign   . Cardiomyopathy     LVEF 40-45%  . Right ventricular mural thrombus     On Coumadin  . Cor pulmonale     Severe RV dysfunction  . Coronary atherosclerosis of native coronary artery     DES to OM - [facility]   Medications:  Prescriptions prior to admission  Medication Sig Dispense Refill  . carvedilol (COREG) 6.25 MG tablet Take 1 tablet (6.25 mg total) by mouth 2 (two) times daily.  180 tablet  3  . cloNIDine (CATAPRES) 0.1 MG tablet Take 1 tablet (0.1 mg total) by mouth 3 (three) times daily.  60 tablet  11  . Fluticasone-Salmeterol (ADVAIR) 250-50 MCG/DOSE AEPB Inhale 1 puff into the lungs every 12 (twelve) hours.      . furosemide (LASIX) 40 MG tablet Take 1 tablet (40 mg total) by mouth daily.  30 tablet  6  . ipratropium-albuterol (DUONEB) 0.5-2.5 (3) MG/3ML SOLN Take 3 mLs by nebulization every 6 (six)  hours as needed (Shortness of breath).      Marland Kitchen omeprazole (PRILOSEC) 20 MG capsule Take 20 mg by mouth 2 (two) times daily.       . potassium chloride (K-DUR) 10 MEQ tablet Take 2 tablets (20 mEq total) by mouth daily.  60 tablet  6  . prasugrel (EFFIENT) 10 MG TABS Take 10 mg by mouth daily.      . predniSONE (DELTASONE) 20 MG tablet Take 1 tablet (20 mg total) by mouth daily.  10 tablet  0  . rosuvastatin (CRESTOR) 10 MG tablet Take 10 mg by mouth daily.      Marland Kitchen warfarin (COUMADIN) 2.5 MG tablet Take 2.5-3.75 mg by mouth See admin instructions. Takes 3.75 mg (1.5 tablets) for 3 days in the evening, then take 2.5mg  (1 tablet) on the fourth day, then repeat as directed per MD      . albuterol (PROVENTIL HFA;VENTOLIN HFA) 108 (90 BASE) MCG/ACT inhaler Inhale 2 puffs into the lungs every 6 (six) hours as needed for wheezing.      Marland Kitchen azithromycin (ZITHROMAX) 250 MG tablet Take 250-500 mg by mouth See admin instructions. Take two tablets on day 1, then take one tablet on days 2 through 5      . nitroGLYCERIN (NITROSTAT) 0.4 MG SL tablet Place 1 tablet (0.4 mg total) under the tongue every 5 (five) minutes as  needed for chest pain.  90 tablet  3    Assessment: 57yo male who presented to the ED because of sudden, spontaneous episode of uncontrollable right epistaxis.  Bleeding was eventually controlled in ED by nasal packing/balloon inflation.  Pt was also hypoxic in ED. INR was therapeutic on admission but is now slightly below goal due to Coumadin being on hold. Plan is for ENT to evaluate patient.  Pt is also on Effient.  Goal of Therapy:  INR 2-3 when Coumadin resumed Monitor platelets by anticoagulation protocol: Yes   Plan:  Hold Coumadin until evaluated by ENT INR daily  Danny [name] A 12/25/2012,10:33 AM

## 2012-12-25 NOTE — Progress Notes (Signed)
[id number] 

## 2012-12-25 NOTE — Progress Notes (Signed)
Danny [name], Danny [name]                ACCOUNT NO.:  [id number]  MEDICAL RECORD NO.:  [id number]  LOCATION:  IC03                          FACILITY:  [facility]  PHYSICIAN:  Melvyn Novas, MDDATE OF BIRTH:  [birth year]  DATE OF PROCEDURE: DATE OF DISCHARGE:                                PROGRESS NOTE   SUBJECTIVE:  The patient is a 57 year old black male with history of hypertension, hyperlipidemia, status post MI, CAD and stenting, ischemic cardiomyopathy, ejection fraction 40-45% with documented RV thrombus by echo placed on Coumadin.  Likewise, he is on Effient for his stent placement a month ago.  The patient presented with epistaxis now under control with the nose packing.  INR was therapeutic at 2.16.  Coumadin currently on hold.  PHYSICAL EXAMINATION:  VITAL SIGNS:  Blood pressure 147/104 now 129/91, and room temperature 97.7, pulse is 80 and regular, respiratory rate is 17.  The patient likewise has bullous emphysema, oxygen dependent.  He desaturated at the OR. LUNGS:  Prolonged inspiratory and expiratory phase.  Scattered rhonchi. No rales, no wheeze, no respiratory distress, O2 sat is 99%. HEART:  Regular rhythm.  No S3, S4.  No heaves, thrills, or rubs. ABDOMEN:  Soft, nontender.  Bowel sounds normoactive.  IMPRESSION: 1. Epistaxis in the face of Effient as well as Coumadin.  Coumadin     currently on hold, now controlled and no evidence of continued     bleed with nose packing. 2. Coronary artery disease status post stent. 3. Right ventricular thrombus documented by echo was on Coumadin. 4. Ischemic cardiomyopathy with ejection fraction 45%. 5. Hypertension. 6. Hyperlipidemia. 7. Bullous emphysema.  PLAN:  Right now, he is continue holding Coumadin.  Continue Effient for now.  We will get a cardiology as well as pharmacy opinions on combination of Effient as well as Coumadin the face of epistaxis and risk benefit ratio.  Continue monitoring INR daily.  Await  both of these consults.  The patient appears hemodynamically stable.  ENT consult likewise ordered.     Melvyn Novas, MD     RMD/MEDQ  D:  12/24/2012  T:  12/24/2012  Job:  [id number]

## 2012-12-25 NOTE — Progress Notes (Signed)
Patient transfered to 3rd floor. Report given to Loreli Slot RN. Vital signs stable at transfer.

## 2012-12-26 MED ORDER — MUPIROCIN 2 % EX OINT
TOPICAL_OINTMENT | Freq: Three times a day (TID) | CUTANEOUS | Status: DC
  Filled 2012-12-26: qty 22

## 2012-12-26 MED ORDER — ROSUVASTATIN CALCIUM 10 MG PO TABS: 10.0000 mg | ORAL_TABLET | Freq: Every day | ORAL | Status: DC

## 2012-12-26 MED ORDER — RAMIPRIL 5 MG PO CAPS: 5.0000 mg | ORAL_CAPSULE | Freq: Every day | ORAL | Status: DC

## 2012-12-26 MED ORDER — OXYCODONE HCL 5 MG PO TABS: 5.0000 mg | ORAL_TABLET | ORAL | Status: DC | PRN

## 2012-12-26 MED ORDER — OXYMETAZOLINE HCL 0.05 % NA SOLN
2.0000 | Freq: Three times a day (TID) | NASAL | Status: DC | PRN
  Filled 2012-12-26: qty 15

## 2012-12-26 NOTE — Progress Notes (Signed)
ANTICOAGULATION CONSULT NOTE   Pharmacy Consult for Coumadin (chronic PTA) and currently on hold due to admission for epistaxis, nasal packing and awaiting ENT consult and recommendations.  Indication: h/o right ventricular thrombus  Allergies  Allergen Reactions  . Lipitor [Atorvastatin] Other (See Comments)    Muscle spasms    Patient Measurements: Height: 5\' 7"  (170.2 cm) Weight: 163 lb 12.8 oz (74.3 kg) IBW/kg (Calculated) : 66.1  Vital Signs: Temp: 97.8 F (36.6 C) (09/25 0421) Temp src: Oral (09/25 0421) BP: 103/66 mmHg (09/25 0421) Pulse Rate: 80 (09/25 0421)  Labs:  Recent Labs  12/23/12 1955 12/24/12 0415 12/25/12 0504 12/26/12 0512  HGB 14.8 15.1 13.5  --   HCT 46.4 46.5 41.9  --   PLT 178 189 197  --   LABPROT 23.4* 20.4* 20.7* 18.3*  INR 2.16* 1.80* 1.84* 1.57*  CREATININE 1.29 1.13  --   --    Estimated Creatinine Clearance: 69.1 ml/min (by C-G formula based on Cr of 1.13).  Medical History: Past Medical History  Diagnosis Date  . GERD (gastroesophageal reflux disease)   . COPD (chronic obstructive pulmonary disease)   . Myocardial infarction 05/02/12  . Chronic systolic heart failure   . Essential hypertension, benign   . Cardiomyopathy     LVEF 40-45%  . Right ventricular mural thrombus     On Coumadin  . Cor pulmonale     Severe RV dysfunction  . Coronary atherosclerosis of native coronary artery     DES to OM - [facility]   Medications:  Prescriptions prior to admission  Medication Sig Dispense Refill  . carvedilol (COREG) 6.25 MG tablet Take 1 tablet (6.25 mg total) by mouth 2 (two) times daily.  180 tablet  3  . cloNIDine (CATAPRES) 0.1 MG tablet Take 1 tablet (0.1 mg total) by mouth 3 (three) times daily.  60 tablet  11  . Fluticasone-Salmeterol (ADVAIR) 250-50 MCG/DOSE AEPB Inhale 1 puff into the lungs every 12 (twelve) hours.      . furosemide (LASIX) 40 MG tablet Take 1 tablet (40 mg total) by mouth daily.  30 tablet  6   . ipratropium-albuterol (DUONEB) 0.5-2.5 (3) MG/3ML SOLN Take 3 mLs by nebulization every 6 (six) hours as needed (Shortness of breath).      Marland Kitchen omeprazole (PRILOSEC) 20 MG capsule Take 20 mg by mouth 2 (two) times daily.       . potassium chloride (K-DUR) 10 MEQ tablet Take 2 tablets (20 mEq total) by mouth daily.  60 tablet  6  . prasugrel (EFFIENT) 10 MG TABS Take 10 mg by mouth daily.      . predniSONE (DELTASONE) 20 MG tablet Take 1 tablet (20 mg total) by mouth daily.  10 tablet  0  . rosuvastatin (CRESTOR) 10 MG tablet Take 10 mg by mouth daily.      Marland Kitchen warfarin (COUMADIN) 2.5 MG tablet Take 2.5-3.75 mg by mouth See admin instructions. Takes 3.75 mg (1.5 tablets) for 3 days in the evening, then take 2.5mg  (1 tablet) on the fourth day, then repeat as directed per MD      . albuterol (PROVENTIL HFA;VENTOLIN HFA) 108 (90 BASE) MCG/ACT inhaler Inhale 2 puffs into the lungs every 6 (six) hours as needed for wheezing.      Marland Kitchen azithromycin (ZITHROMAX) 250 MG tablet Take 250-500 mg by mouth See admin instructions. Take two tablets on day 1, then take one tablet on days 2 through 5      .  nitroGLYCERIN (NITROSTAT) 0.4 MG SL tablet Place 1 tablet (0.4 mg total) under the tongue every 5 (five) minutes as needed for chest pain.  90 tablet  3   Assessment: 57yo male who presented to the ED because of sudden, spontaneous episode of uncontrollable right epistaxis.  Bleeding was eventually controlled in ED by nasal packing/balloon inflation.  Pt was also hypoxic in ED. INR was therapeutic on admission but is now below goal due to Coumadin being on hold. Plan is for ENT to evaluate patient.  Pt is also on Effient.  Goal of Therapy:  INR 2-3 when Coumadin resumed Monitor platelets by anticoagulation protocol: Yes   Plan:  Hold Coumadin until evaluated by ENT INR daily  Valrie Hart A 12/26/2012,10:40 AM

## 2012-12-26 NOTE — Progress Notes (Signed)
NAMEDEVONN, Danny [name]                ACCOUNT NO.:  [id number]  MEDICAL RECORD NO.:  [id number]  LOCATION:  IC03                          FACILITY:  [facility]  PHYSICIAN:  Melvyn Novas, MDDATE OF BIRTH:  Sep 05, 1955  DATE OF PROCEDURE: DATE OF DISCHARGE:                                PROGRESS NOTE   SUBJECTIVE:  The patient had a drug-eluting stent, he was on Effient __________, had a MI with ischemic cardiomyopathy, ejection fraction of 45%, has documented RV thrombus, currently on Coumadin, admitted with epistaxis.  Coumadin currently on hold.  WBC is 15.9, hemoglobin 13.5, INR down to 1.84 today.  The patient likewise has bullous emphysema, hypertension, hyperlipidemia and component of anxiety.  There is no further epistaxis of significance at present.  Nose is being unpacked. I asked Cardiology as well as Pharmacology comment on the plan of action with Effient and Coumadin for thrombus and stent as far as a combination and possible potentiating effects in anticoagulation.  OBJECTIVE:  VITAL SIGNS:  Awaiting further input upon this, blood pressure much improved 106/70, temperature 98.3, pulse is 82 and regular, respiratory rate is 18. LUNGS:  Scattered rhonchi.  Prolonged inspiratory and expiratory phase. No rales, no wheeze appreciable. HEART:  Regular rhythm.  No S3, S4.  No heaves, thrills, or rubs.  PLAN:  Resume his albuterol nebulizer t.i.d. as at home.  Monitor PT/INR.  We will hope to discharge in 24 hours, if definitive plan of action as far as anticoagulation can be formulated.     Melvyn Novas, MD     RMD/MEDQ  D:  12/25/2012  T:  12/25/2012  Job:  [id number]

## 2012-12-26 NOTE — Progress Notes (Signed)
Patient received discharge instructions along with follow up appointments and prescriptions. Patient verbalized understanding of all instructions. Patient was escorted by staff via wheelchair to vehicle. Patient discharged to home in stable condition. 

## 2012-12-26 NOTE — Consult Note (Signed)
Spoke with Dr. Janna Arch by phone regarding [name], apparently had right epistaxis from combination of Effient and Coumadin, admitted on 12/24/12. Apparently ER was able to control epistaxis with placement of a nasal pack and per report patient's epistaxis has been controlled with holding coumadin and the nasal packing. ENT was consulted to possibly come to [facility] to see patient or to transfer patient to [facility] for evaluation. I recommended that if the packing has controlled the bleeding then the best course of action would be to leave the packing in place for 5-7 days, have the patient on prophylactic antibiotics to cover strep/staph, and to have the patient see [place] in the office in 5-7 days to remove the packing and perform nasal endoscopy. I would not recommend removing the packing now if it is effectively controlling the epistaxis. [facility] ENT office number is [phone] to schedule appointment for next week.  Melvenia Beam, MD

## 2012-12-26 NOTE — Progress Notes (Signed)
The Pt is unable to wear BIPAP due to nasal packing.

## 2012-12-26 NOTE — Consult Note (Signed)
[id number] 

## 2012-12-27 NOTE — Progress Notes (Signed)
UR chart review completed.  

## 2012-12-27 NOTE — Discharge Summary (Signed)
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  The patient arrived to the hospital with significant epistaxis.  He has underlying bullous emphysema and during this epistaxis.  He desaturated and was subsequently admitted.  He had no further major respiratory difficulties.  Saturations remained normal in the hospital and he was continued on his albuterol treatments.  There was no evidence of hemodynamic instability.  Blood pressure was somewhat elevated upon admission with the addition of ramipril 5 mg and blood pressure was well controlled.  There was no evidence of heart failure, hemodynamic instability, or renal failure.  The patient had his nose packed, there was no ENT consult at this time.  Discussed over the phone with ENT and they recommended addition of antibiotic to cover staph and strep, and to leave the nose pack and to have him follow up at their office within 3-7 days.  The patient understood this.  He was given the phone number of [phone].  Follow up with ENT.  DISCHARGE MEDICINES: 1. Ramipril 5 mg p.o. daily. 2. Crestor 10 mg p.o. daily. 3. Proventil inhaler HFA 2 puffs q.i.d. 4. Zithromax 250 mg tablets #5, one p.o. daily. 5. Carvedilol 6.25 p.o. b.i.d. 6. Catapres 0.1 mg p.o. b.i.d. 7. Advair 250/50 one puff q.12. 8. Lasix 40 mg p.o. daily. 9. DuoNeb nebulizer t.i.d. at  home. 10.Sublingual nitroglycerin 0.4 p.r.n. 11.Prilosec 20 mg p.o. daily. 12.K-Dur 10 mEq p.o. daily. 13.Effient 10 mg p.o. daily. 14.Crestor 10 mg p.o. daily. 15.Coumadin 2.5 mg p.o. daily.  This was a reduced dosage.  We will follow up in the office to check his hemodynamics, any evidence of congestive heart failure, and his anticoagulation status.  He was likewise given oxycodone 5 mg, 30 tablets for discomfort in his nose 1 q.i.d. p.r.n. with no refills.     Melvyn Novas, MD     RMD/MEDQ  D:  12/26/2012  T:  12/27/2012  Job:  [id number]

## 2012-12-30 ENCOUNTER — Telehealth: Payer: Self-pay | Admitting: *Deleted

## 2012-12-30 ENCOUNTER — Ambulatory Visit (INDEPENDENT_AMBULATORY_CARE_PROVIDER_SITE_OTHER): Payer: Managed Care, Other (non HMO) | Admitting: *Deleted

## 2012-12-30 DIAGNOSIS — I513 Intracardiac thrombosis, not elsewhere classified: Secondary | ICD-10-CM

## 2012-12-30 DIAGNOSIS — I219 Acute myocardial infarction, unspecified: Secondary | ICD-10-CM

## 2012-12-30 DIAGNOSIS — Z7901 Long term (current) use of anticoagulants: Secondary | ICD-10-CM

## 2012-12-30 LAB — POCT INR: INR: 1.3

## 2012-12-30 NOTE — Telephone Encounter (Signed)
FYI: Pt had a coumadin OV today and wanted to inform Dr. Diona Browner, Lorin Picket NP and Dr. Purvis Sheffield that he was advised by Lorin Picket NP and Dr. Purvis Sheffield in the recent hospital visit NOT to change coumadin dosage per several risk factors however the pt was informed on discharge a change was made by PCP during hospital stay as well as discharge instructions and noted pt PT/INR today as 1.3 adjustments made and PT follow up noted next week 01-09-13

## 2012-12-30 NOTE — Telephone Encounter (Signed)
Noted. Thx.

## 2012-12-31 ENCOUNTER — Telehealth: Payer: Self-pay | Admitting: *Deleted

## 2012-12-31 NOTE — Telephone Encounter (Signed)
.  left message to have patient return my call.  

## 2012-12-31 NOTE — Telephone Encounter (Signed)
Pt wife is wanting to know the normal range for HR and all other STATs while sleeping

## 2012-12-31 NOTE — Telephone Encounter (Signed)
.  left message to have patient return my call to clarify per Dr. Diona Browner advised there are no specific ranges to please confirm pt reason for question

## 2013-01-01 NOTE — Telephone Encounter (Signed)
Called pt to confirm current signs and symptoms, pt noted that Dr Juanetta Gosling made the statement that the HR and O2 level will be low at night while he is sleeping per COPD, pt notes ranging at night as 95% per records while sleeping on Bi Pap and wanted to be advised the risk for this from a cardiac standpoint as well to note that while  his nasal cannula comes off while he is sleeping in the chair prior to retiring to the bed and when he wakes up the O2 is noted at 84% and unable to note how long he is off the O2, how much danger is he in when this comes off and how can he help prevent this in anyway. Dr Juanetta Gosling advised not to worry too much about the numbers lowering per pt notes that he does come back up to 95% once he places the O2 on, pt notes on  Pt made aware that the provider is currently providing care for other patients in office/hospital setting/out of office/or away from their desk. As soon as the provider responds to your concern/question/results, a member of our staff will contact you as soon as time permits. If the patient has not heard a response by the end of the business day (5pm) they will be contacted the following business day. Pt understood.PLEASE ADVISE

## 2013-01-01 NOTE — Telephone Encounter (Signed)
In general, the patient's overall risk will be lower if he is consistent with use of supplemental oxygen, particularly if he has documented hypoxia. It sounds as if his oxygen saturations are normal as long as he is using supplemental oxygen. He should closely follow with his pulmonologist as it relates to this issue.

## 2013-01-02 NOTE — Telephone Encounter (Signed)
Spoke to pt wife to advise results/instructions. Pt wife understood.

## 2013-01-06 ENCOUNTER — Ambulatory Visit (INDEPENDENT_AMBULATORY_CARE_PROVIDER_SITE_OTHER): Payer: Managed Care, Other (non HMO) | Admitting: *Deleted

## 2013-01-06 DIAGNOSIS — I513 Intracardiac thrombosis, not elsewhere classified: Secondary | ICD-10-CM

## 2013-01-06 DIAGNOSIS — I219 Acute myocardial infarction, unspecified: Secondary | ICD-10-CM

## 2013-01-06 DIAGNOSIS — J449 Chronic obstructive pulmonary disease, unspecified: Secondary | ICD-10-CM

## 2013-01-06 DIAGNOSIS — Z7901 Long term (current) use of anticoagulants: Secondary | ICD-10-CM

## 2013-01-06 LAB — POCT INR: INR: 1.7

## 2013-01-21 ENCOUNTER — Telehealth: Payer: Self-pay | Admitting: *Deleted

## 2013-01-21 ENCOUNTER — Encounter (HOSPITAL_COMMUNITY): Payer: Self-pay | Admitting: Emergency Medicine

## 2013-01-21 ENCOUNTER — Other Ambulatory Visit: Payer: Self-pay

## 2013-01-21 ENCOUNTER — Emergency Department (HOSPITAL_COMMUNITY)
Admission: EM | Admit: 2013-01-21 | Discharge: 2013-01-21 | Disposition: A | Discharge status: Home / Self Care | Payer: Managed Care, Other (non HMO) | Attending: Emergency Medicine | Admitting: Emergency Medicine

## 2013-01-21 ENCOUNTER — Emergency Department (HOSPITAL_COMMUNITY): Payer: Managed Care, Other (non HMO)

## 2013-01-21 DIAGNOSIS — J4489 Other specified chronic obstructive pulmonary disease: Secondary | ICD-10-CM | POA: Insufficient documentation

## 2013-01-21 DIAGNOSIS — IMO0002 Reserved for concepts with insufficient information to code with codable children: Secondary | ICD-10-CM | POA: Insufficient documentation

## 2013-01-21 DIAGNOSIS — Z79899 Other long term (current) drug therapy: Secondary | ICD-10-CM | POA: Insufficient documentation

## 2013-01-21 DIAGNOSIS — Z7901 Long term (current) use of anticoagulants: Secondary | ICD-10-CM | POA: Insufficient documentation

## 2013-01-21 DIAGNOSIS — I2510 Atherosclerotic heart disease of native coronary artery without angina pectoris: Secondary | ICD-10-CM | POA: Insufficient documentation

## 2013-01-21 DIAGNOSIS — I10 Essential (primary) hypertension: Secondary | ICD-10-CM | POA: Insufficient documentation

## 2013-01-21 DIAGNOSIS — I4949 Other premature depolarization: Secondary | ICD-10-CM | POA: Insufficient documentation

## 2013-01-21 DIAGNOSIS — J449 Chronic obstructive pulmonary disease, unspecified: Secondary | ICD-10-CM | POA: Insufficient documentation

## 2013-01-21 DIAGNOSIS — I5022 Chronic systolic (congestive) heart failure: Secondary | ICD-10-CM | POA: Insufficient documentation

## 2013-01-21 DIAGNOSIS — R531 Weakness: Secondary | ICD-10-CM

## 2013-01-21 DIAGNOSIS — I252 Old myocardial infarction: Secondary | ICD-10-CM | POA: Insufficient documentation

## 2013-01-21 DIAGNOSIS — R5381 Other malaise: Secondary | ICD-10-CM | POA: Insufficient documentation

## 2013-01-21 DIAGNOSIS — Z87891 Personal history of nicotine dependence: Secondary | ICD-10-CM | POA: Insufficient documentation

## 2013-01-21 DIAGNOSIS — K219 Gastro-esophageal reflux disease without esophagitis: Secondary | ICD-10-CM | POA: Insufficient documentation

## 2013-01-21 DIAGNOSIS — R42 Dizziness and giddiness: Secondary | ICD-10-CM | POA: Insufficient documentation

## 2013-01-21 DIAGNOSIS — I493 Ventricular premature depolarization: Secondary | ICD-10-CM

## 2013-01-21 LAB — CBC WITH DIFFERENTIAL/PLATELET
Basophils Absolute: 0 10*3/uL (ref 0.0–0.1)
Eosinophils Relative: 1 % (ref 0–5)
MCH: 33.2 pg (ref 26.0–34.0)
Monocytes Absolute: 1 10*3/uL (ref 0.1–1.0)
Monocytes Relative: 10 % (ref 3–12)
Neutrophils Relative %: 60 % (ref 43–77)
Platelets: 217 10*3/uL (ref 150–400)
RBC: 4.67 MIL/uL (ref 4.22–5.81)
RDW: 14 % (ref 11.5–15.5)
WBC: 9.5 10*3/uL (ref 4.0–10.5)

## 2013-01-21 LAB — URINALYSIS, ROUTINE W REFLEX MICROSCOPIC
Bilirubin Urine: NEGATIVE
Glucose, UA: NEGATIVE mg/dL
Ketones, ur: NEGATIVE mg/dL
Leukocytes, UA: NEGATIVE
Nitrite: NEGATIVE
Specific Gravity, Urine: 1.01 (ref 1.005–1.030)
pH: 6.5 (ref 5.0–8.0)

## 2013-01-21 LAB — BASIC METABOLIC PANEL
BUN: 15 mg/dL (ref 6–23)
Calcium: 9.8 mg/dL (ref 8.4–10.5)
GFR calc Af Amer: 80 mL/min — ABNORMAL LOW (ref >90)
GFR calc non Af Amer: 69 mL/min — ABNORMAL LOW (ref >90)
Potassium: 3.4 mEq/L — ABNORMAL LOW (ref 3.5–5.1)
Sodium: 142 mEq/L (ref 135–145)

## 2013-01-21 LAB — TROPONIN I: Troponin I: <0.3 ng/mL (ref <0.30)

## 2013-01-21 MED ORDER — LEVOFLOXACIN 750 MG PO TABS
750.0000 mg | ORAL_TABLET | Freq: Every day | ORAL | Status: DC
  Administered 2013-01-21: 750 mg via ORAL
  Filled 2013-01-21: qty 1

## 2013-01-21 MED ORDER — LEVOFLOXACIN 750 MG PO TABS: 750.0000 mg | ORAL_TABLET | Freq: Every day | ORAL | Status: DC

## 2013-01-21 NOTE — ED Notes (Signed)
Patient with no complaints at this time. Respirations even and unlabored. Skin warm/dry. Discharge instructions reviewed with patient at this time. Patient given opportunity to voice concerns/ask questions. IV removed per policy and band-aid applied to site. Patient discharged at this time and left Emergency Department with steady gait.  

## 2013-01-21 NOTE — ED Notes (Signed)
Patient c/o intermittent burning pain to right flank area that gives him the feeling that he needs to urinate (pressure). Urine specimen obtained earlier, sent for analysis. MD aware.

## 2013-01-21 NOTE — ED Provider Notes (Signed)
CSN: [id number]     Arrival date & time 01/21/13  1321 History  This chart was scribed for Benny Lennert, MD by Caryn Bee, ED Scribe. This patient was seen in room [facility] and the patient's care was started 2:15 PM.    Chief Complaint  Patient presents with  . Irregular Heart Beat   Patient is a 57 y.o. male presenting with palpitations. The history is provided by the patient. No language interpreter was used.  Palpitations Palpitations quality:  Irregular Onset quality:  Sudden Timing:  Intermittent Progression:  Unchanged Chronicity:  New Ineffective treatments:  None tried Associated symptoms: dizziness   Associated symptoms: no back pain, no chest pain and no cough   Risk factors: hx of DVT    HPI Comments: Danny [name] is a 57 y.o. male brought in by ambulance, who presents to the Emergency Department complaining of irregular heart rate that began a few hours ago. He reports that it feels like his heart is "jumping". Pt also reports dizziness onset during the episodes of irregular heart beat. Pt states that he has been feeling weak lately. Pt denies abdominal pain. He also has a blood clot in his heart for which he is takes 3.75 mg Coumadin daily.  He has an appointment with his cardiologist on 01/23/13. Pt's Cardiologist is Dr. Diona Browner. Pt PCP is Dr. Janna Arch.    Past Medical History  Diagnosis Date  . GERD (gastroesophageal reflux disease)   . COPD (chronic obstructive pulmonary disease)   . Myocardial infarction 05/02/12  . Chronic systolic heart failure   . Essential hypertension, benign   . Cardiomyopathy     LVEF 40-45%  . Right ventricular mural thrombus     On Coumadin  . Cor pulmonale     Severe RV dysfunction  . Coronary atherosclerosis of native coronary artery     DES to OM - [facility]   Past Surgical History  Procedure Laterality Date  . 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 History  Substance Use Topics  . Smoking status: Former Smoker -- 2.00 packs/day for 25 years    Types: Cigarettes    Quit date: 04/03/1994  . Smokeless tobacco: Never Used  . Alcohol Use: No    Review of Systems  Constitutional: Positive for fatigue. Negative for appetite change.  HENT: Negative for congestion, ear discharge and sinus pressure.   Eyes: Negative for discharge.  Respiratory: Negative for cough.   Cardiovascular: Positive for palpitations. Negative for chest pain.  Gastrointestinal: Negative for abdominal pain and diarrhea.  Genitourinary: Negative for frequency and hematuria.  Musculoskeletal: Negative for back pain.  Skin: Negative for rash.  Neurological: Positive for dizziness. Negative for seizures and headaches.  Psychiatric/Behavioral: Negative for hallucinations.  All other systems reviewed and are negative.    Allergies  Lipitor  Home Medications   Current Outpatient Rx  Name  Route  Sig  Dispense  Refill  . albuterol (PROVENTIL HFA;VENTOLIN HFA) 108 (90 BASE) MCG/ACT inhaler   Inhalation   Inhale 2 puffs into the lungs every 6 (six) hours as needed for wheezing.         . carvedilol (COREG) 6.25 MG tablet   Oral   Take 1 tablet (6.25 mg total) by mouth 2 (two) times daily.   180 tablet   3   . cloNIDine (CATAPRES) 0.1 MG tablet   Oral   Take 1 tablet (0.1 mg  total) by mouth 3 (three) times daily.   60 tablet   11   . Fluticasone-Salmeterol (ADVAIR) 250-50 MCG/DOSE AEPB   Inhalation   Inhale 1 puff into the lungs every 12 (twelve) hours.         . furosemide (LASIX) 40 MG tablet   Oral   Take 1 tablet (40 mg total) by mouth daily.   30 tablet   6   . ipratropium-albuterol (DUONEB) 0.5-2.5 (3) MG/3ML SOLN   Nebulization   Take 3 mLs by nebulization every 6 (six) hours as needed (Shortness of breath).         Marland Kitchen omeprazole (PRILOSEC) 20 MG capsule   Oral   Take 20 mg by mouth 2 (two) times daily.          Marland Kitchen  oxyCODONE (ROXICODONE) 5 MG immediate release tablet   Oral   Take 1 tablet (5 mg total) by mouth every 4 (four) hours as needed for pain.   30 tablet   0   . potassium chloride (K-DUR) 10 MEQ tablet   Oral   Take 20 mEq by mouth at bedtime.         . prasugrel (EFFIENT) 10 MG TABS   Oral   Take 10 mg by mouth daily.         . ramipril (ALTACE) 5 MG capsule   Oral   Take 1 capsule (5 mg total) by mouth daily.   30 capsule   1   . rosuvastatin (CRESTOR) 10 MG tablet   Oral   Take 1 tablet (10 mg total) by mouth daily at 6 PM.   30 tablet   1   . warfarin (COUMADIN) 2.5 MG tablet   Oral   Take 3.75 mg by mouth daily.          . nitroGLYCERIN (NITROSTAT) 0.4 MG SL tablet   Sublingual   Place 1 tablet (0.4 mg total) under the tongue every 5 (five) minutes as needed for chest pain.   90 tablet   3    Triage Vitals: BP 122/89  Pulse 106  Temp(Src) 99.1 F (37.3 C) (Oral)  Resp 22  Ht 5\' 7"  (1.702 m)  Wt 166 lb (75.297 kg)  BMI 25.99 kg/m2  SpO2 96%  Physical Exam  Nursing note and vitals reviewed. Constitutional: He is oriented to person, place, and time. He appears well-developed and well-nourished. No distress.  HENT:  Head: Normocephalic.  Eyes: Conjunctivae and EOM are normal. No scleral icterus.  Neck: Neck supple. No thyromegaly present.  Cardiovascular: Regular rhythm.  Exam reveals no gallop and no friction rub.   No murmur heard. Pulmonary/Chest: No stridor. He has no wheezes. He has no rales. He exhibits no tenderness.  Abdominal: He exhibits no distension. There is no tenderness. There is no rebound.  Musculoskeletal: Normal range of motion. He exhibits no edema.  Lymphadenopathy:    He has no cervical adenopathy.  Neurological: He is alert and oriented to person, place, and time. He exhibits normal muscle tone. Coordination normal.  Skin: Skin is warm and dry. No rash noted. He is not diaphoretic. No erythema.  Psychiatric: He has a normal  mood and affect. His behavior is normal.    ED Course  Procedures (including critical care time) DIAGNOSTIC STUDIES: Oxygen Saturation is 96% on room air, adequate by my interpretation.    COORDINATION OF CARE: 2:09 PM-Discussed treatment plan which includes  (CXR, CBC panel, CMP, UA) with pt at bedside  and pt agreed to plan.   Labs Review Labs Reviewed  BASIC METABOLIC PANEL - Abnormal; Notable for the following:    Potassium 3.4 (*)    CO2 37 (*)    Glucose, Bld 104 (*)    GFR calc non Af Amer 69 (*)    GFR calc Af Amer 80 (*)    All other components within normal limits  TROPONIN I  CBC WITH DIFFERENTIAL   Imaging Review Dg Chest Portable 1 View  01/21/2013   CLINICAL DATA:  Tachycardia. Short of breath.  EXAM: PORTABLE CHEST - 1 VIEW  COMPARISON:  10/23/2012.  FINDINGS: Chronic bilateral pulmonary parenchymal scarring is present with elevation of the left hemidiaphragm. Rib deformity is present on the right, which is chronic. Monitoring leads project over the chest. Cardiopericardial silhouette is mildly enlarged for projection. Mediastinal contours appear similar to the prior exam.  Compared to prior exam, there is a new small focus of airspace disease near the right costophrenic angle with a probable small right pleural effusion suspicious for pneumonia. Followup to ensure radiographic clearing and exclude an underlying lesion is recommended. Typically clearing will be observed at 8 weeks.  IMPRESSION: Small focus of airspace density at the right costophrenic angle is new compared to prior exam, suspicious for pneumonia. Symmetric/atypical pulmonary edema considered less likely.   Electronically Signed   By: Andreas Newport M.D.   On: 01/21/2013 14:11    EKG Interpretation     Ventricular Rate:  101 PR Interval:  186 QRS Duration: 104 QT Interval:  364 QTC Calculation: 471 R Axis:   163 Text Interpretation:  Sinus tachycardia with Premature supraventricular complexes Right  ventricular hypertrophy with repolarization abnormality Inferior infarct , age undetermined Abnormal ECG When compared with ECG of 21-Jan-2013 13:20, Premature ventricular complexes are no longer Present Premature supraventricular complexes are now Present Inferior infarct is now Present           Medications - No data to display  MDM  No diagnosis found. Pt was offered obs admission, but wanted to go home.     Will tx for possible pneumonia.  Pt to see cards in 2 days    Benny Lennert, MD 01/21/13 [phone]

## 2013-01-21 NOTE — ED Notes (Signed)
Patient c/o cramping to feet. Patient repositioned and sitting on side of bed. Stretching performed.

## 2013-01-21 NOTE — ED Notes (Signed)
Patient states his significant other went home to bring his oxygen tank back for discharge. Initially patient was going to go home by EMS. Patient states to cancel EMS and that he will go home when she returns.

## 2013-01-21 NOTE — ED Notes (Signed)
Patient arrives via EMS from home with c/o irregular heart rate. Patient reports episode several days ago of dizziness and his heart "fluttering". Patient c/o same today along with some chest pain. Denies pain now. Extensive heart history. Alert/oriented x 4. No distress at present.

## 2013-01-21 NOTE — Telephone Encounter (Signed)
Noted pt wife called into to office to advise he noted fluid gain and was not feeling well, HR changing from 60-110 with no resolution, pt was advised to go to the ER per multiple issues, will review pt chart to ensure pt was seen in ED as advised

## 2013-01-22 ENCOUNTER — Telehealth: Payer: Self-pay | Admitting: *Deleted

## 2013-01-22 NOTE — Telephone Encounter (Signed)
Please call patient regarding antibiotic was put on in ED last night / tgs

## 2013-01-22 NOTE — Telephone Encounter (Signed)
Pt in ED last night.  Started on Levaquin QD for possible pneumonia.  Will take first dose of levaquin tonight and come for INR check tomorrow as previously scheduled and dose coumadin based on results.

## 2013-01-23 ENCOUNTER — Encounter: Payer: Self-pay | Admitting: Cardiology

## 2013-01-23 ENCOUNTER — Ambulatory Visit (INDEPENDENT_AMBULATORY_CARE_PROVIDER_SITE_OTHER): Payer: Managed Care, Other (non HMO) | Admitting: *Deleted

## 2013-01-23 ENCOUNTER — Ambulatory Visit (INDEPENDENT_AMBULATORY_CARE_PROVIDER_SITE_OTHER): Payer: Managed Care, Other (non HMO) | Admitting: Cardiology

## 2013-01-23 VITALS — BP 97/69 | HR 105 | Ht 66.0 in | Wt 166.0 lb

## 2013-01-23 DIAGNOSIS — I429 Cardiomyopathy, unspecified: Secondary | ICD-10-CM

## 2013-01-23 DIAGNOSIS — I219 Acute myocardial infarction, unspecified: Secondary | ICD-10-CM

## 2013-01-23 DIAGNOSIS — I513 Intracardiac thrombosis, not elsewhere classified: Secondary | ICD-10-CM

## 2013-01-23 DIAGNOSIS — R002 Palpitations: Secondary | ICD-10-CM

## 2013-01-23 DIAGNOSIS — J449 Chronic obstructive pulmonary disease, unspecified: Secondary | ICD-10-CM

## 2013-01-23 DIAGNOSIS — Z7901 Long term (current) use of anticoagulants: Secondary | ICD-10-CM

## 2013-01-23 DIAGNOSIS — I2510 Atherosclerotic heart disease of native coronary artery without angina pectoris: Secondary | ICD-10-CM

## 2013-01-23 NOTE — Telephone Encounter (Signed)
Noted pt was evaluated in Ed for tx of PNA, pt has apt with Lorin Picket NP today 01-23-13

## 2013-01-23 NOTE — Assessment & Plan Note (Signed)
Status post DES to the obtuse marginal in an outside facility in February of this year. Continue Effient. Aspirin was stopped since he is concurrently on Coumadin.

## 2013-01-23 NOTE — Patient Instructions (Addendum)
Your physician recommends that you schedule a follow-up appointment in: TWO MONTHS  Your physician has recommended that you wear a holter monitor. Holter monitors are medical devices that record the heart's electrical activity. Doctors most often use these monitors to diagnose arrhythmias. Arrhythmias are problems with the speed or rhythm of the heartbeat. The monitor is a small, portable device. You can wear one while you do your normal daily activities. This is usually used to diagnose what is causing palpitations/syncope (passing out).  Your physician has requested that you have an echocardiogram. Echocardiography is a painless test that uses sound waves to create images of your heart. It provides your doctor with information about the size and shape of your heart and how well your heart's chambers and valves are working. This procedure takes approximately one hour. There are no restrictions for this procedure.  WE WILL CALL YOU WITH YOUR TEST RESULTS/INSTRUCTIONS/NEXT STEPS ONCE RECEIVED BY THE PROVIDER  PLEASE BE ADVISED YOU WILL STILL NEED TO KEEP YOUR FOLLOW UP APPOINTMENT TO DISCUSS FURTHER DETAILS OF YOUR TEST RESULTS/NEXT STEPS/FUTURE PLAN OF CARE WITH YOUR PROVIDER DESPITE THE FACT THAT YOU MAY HAVE NORMAL TEST RESULTS

## 2013-01-23 NOTE — Assessment & Plan Note (Signed)
Associated with severe RV dysfunction. Plan is to continue Coumadin. Followup echocardiogram to be obtained as well.

## 2013-01-23 NOTE — Progress Notes (Signed)
Clinical Summary [name] is a medically complex 57 y.o.male that I met for the first time back in August of this year, a former patient of Dr. Eden [name]. Subsequent records reviewed including hospitalization with epistaxis in September, seen in consultation by Dr. Purvis [name]. He was ultimately able to continue on Coumadin and Effient with history of RV thrombus and also DES placement. He was just recently seen in the ER, diagnosed with possible pneumonia and placed on Levaquin. He continues to follow his Coumadin through our Coumadin clinic.  Lab work from recent ER visit showed normal troponin I levels, hemoglobin 13.5, WBC 15.9, potassium 3.4, BUN 15, creatinine 1.1. Weight is stable compared August.  He is here with his wife today. He is wearing oxygen, using a wheelchair. His pulmonary care is now with Dr. Juanetta [name]. They tell me that referral is being made to [facility] for further evaluation.  From a cardiac perspective, he reports no angina symptoms. He has chronic NYHA class III dyspnea. Does report occasional sense of rapid palpitations. ECG reviewed from recent ER visit shows sinus rhythm with frequent PVCs and PACs on serial tracings. No clear history of atrial arrhythmias noted.  Today we discussed his diagnosis of CAD with DES to the obtuse marginal at an outside facility, also cardiomyopathy with LVEF 40-45%, and also right ventricular dysfunction with mural thrombus for which we continue Coumadin treatment.   Allergies  Allergen Reactions  . Lipitor [Atorvastatin] Other (See Comments)    Muscle spasms     Current Outpatient Prescriptions  Medication Sig Dispense Refill  . albuterol (PROVENTIL HFA;VENTOLIN HFA) 108 (90 BASE) MCG/ACT inhaler Inhale 2 puffs into the lungs every 6 (six) hours as needed for wheezing.      . carvedilol (COREG) 6.25 MG tablet Take 1 tablet (6.25 mg total) by mouth 2 (two) times daily.  180 tablet  3  . cloNIDine (CATAPRES) 0.1 MG tablet Take 1  tablet (0.1 mg total) by mouth 3 (three) times daily.  60 tablet  11  . Fluticasone-Salmeterol (ADVAIR) 250-50 MCG/DOSE AEPB Inhale 1 puff into the lungs every 12 (twelve) hours.      . furosemide (LASIX) 40 MG tablet Take 1 tablet (40 mg total) by mouth daily.  30 tablet  6  . ipratropium-albuterol (DUONEB) 0.5-2.5 (3) MG/3ML SOLN Take 3 mLs by nebulization every 6 (six) hours as needed (Shortness of breath).      Marland Kitchen levofloxacin (LEVAQUIN) 750 MG tablet Take 1 tablet (750 mg total) by mouth daily. X 7 days  7 tablet  0  . nitroGLYCERIN (NITROSTAT) 0.4 MG SL tablet Place 1 tablet (0.4 mg total) under the tongue every 5 (five) minutes as needed for chest pain.  90 tablet  3  . omeprazole (PRILOSEC) 20 MG capsule Take 20 mg by mouth 2 (two) times daily.       Marland Kitchen oxyCODONE (ROXICODONE) 5 MG immediate release tablet Take 1 tablet (5 mg total) by mouth every 4 (four) hours as needed for pain.  30 tablet  0  . potassium chloride (K-DUR) 10 MEQ tablet Take 20 mEq by mouth at bedtime.      . prasugrel (EFFIENT) 10 MG TABS Take 10 mg by mouth daily.      . predniSONE (DELTASONE) 20 MG tablet Take 20 mg by mouth as needed.       . ramipril (ALTACE) 5 MG capsule Take 1 capsule (5 mg total) by mouth daily.  30 capsule  1  .  rosuvastatin (CRESTOR) 10 MG tablet Take 1 tablet (10 mg total) by mouth daily at 6 PM.  30 tablet  1  . warfarin (COUMADIN) 2.5 MG tablet Take 3.75 mg by mouth daily.        No current facility-administered medications for this visit.    Past Medical History  Diagnosis Date  . GERD (gastroesophageal reflux disease)   . COPD (chronic obstructive pulmonary disease)   . Myocardial infarction 05/02/12  . Chronic systolic heart failure   . Essential hypertension, benign   . Cardiomyopathy     LVEF 40-45%  . Right ventricular mural thrombus     On Coumadin  . Cor pulmonale     Severe RV dysfunction  . Coronary atherosclerosis of native coronary artery     DES to OM - [facility]    Past Surgical History  Procedure Laterality Date  . Lung removal, partial Bilateral 04/26/2000    Bullectomy    Social History [name] reports that he quit smoking about 18 years ago. His smoking use included Cigarettes. He has a 50 pack-year smoking history. He has never used smokeless tobacco. [name] reports that he does not drink alcohol.  Review of Systems Does have increases in weight of 2-4 pounds, uses extra diuretic dose on those days. This has happened approximately 3 times in the last few months. Otherwise as outlined above.  Physical Examination Filed Vitals:   01/23/13 1051  BP: 97/69  Pulse: 105   Filed Weights   01/23/13 1051  Weight: 166 lb (75.297 kg)    Wearing oxygen via [place].  HEENT: Conjunctiva and lids normal, oropharynx clear.  Neck: Supple, no elevated JVP or carotid bruits, no thyromegaly.  Lungs: Decreased breath sounds mainly upper lobes, nonlabored breathing at rest.  Cardiac: Regular rate and rhythm, indistinct PMI, no S3 or significant systolic murmur, no pericardial rub.  Abdomen: Soft, nontender, bowel sounds present.  Extremities: 1+ edema, distal pulses 2+.  Skin: Warm and dry.  Musculoskeletal: No kyphosis.  Neuropsychiatric: Alert and oriented x3, affect grossly appropriate.   Problem List and Plan   Palpitations Described recently. No syncope. He did have frequent PVCs and PACs on recent ECGs with ER visit. States that they notice fluctuation in heart rate when using his pulse oximeter home. A 48 hour Holter monitor will be obtained.  RV (right ventricular) mural thrombus Associated with severe RV dysfunction. Plan is to continue Coumadin. Followup echocardiogram to be obtained as well.  Secondary cardiomyopathy, unspecified LVEF 40-45%, weight stable. He continues on Coreg, Lasix, Altace, potassium supplements.  Coronary atherosclerosis of native coronary artery Status post DES to the obtuse marginal in an outside  facility in February of this year. Continue Effient. Aspirin was stopped since he is concurrently on Coumadin.  COPD (chronic obstructive pulmonary disease) Oxygen dependent with cor pulmonale. Keep follow up with Dr. Juanetta [name]. Sounds like patient may also be referred for evaluation at [facility].    Jonelle Sidle, M.D., F.A.C.C.

## 2013-01-23 NOTE — Assessment & Plan Note (Signed)
LVEF 40-45%, weight stable. He continues on Coreg, Lasix, Altace, potassium supplements.

## 2013-01-23 NOTE — Assessment & Plan Note (Signed)
Oxygen dependent with cor pulmonale. Keep follow up with Dr. Juanetta Gosling. Sounds like patient may also be referred for evaluation at [facility].

## 2013-01-23 NOTE — Assessment & Plan Note (Signed)
Described recently. No syncope. He did have frequent PVCs and PACs on recent ECGs with ER visit. States that they notice fluctuation in heart rate when using his pulse oximeter home. A 48 hour Holter monitor will be obtained.

## 2013-01-27 ENCOUNTER — Ambulatory Visit (HOSPITAL_COMMUNITY)
Admission: RE | Admit: 2013-01-27 | Discharge: 2013-01-27 | Disposition: A | Discharge status: Home / Self Care | Payer: Managed Care, Other (non HMO) | Source: Ambulatory Visit | Attending: Cardiology | Admitting: Cardiology

## 2013-01-27 ENCOUNTER — Ambulatory Visit (INDEPENDENT_AMBULATORY_CARE_PROVIDER_SITE_OTHER): Payer: Managed Care, Other (non HMO) | Admitting: *Deleted

## 2013-01-27 DIAGNOSIS — R002 Palpitations: Secondary | ICD-10-CM | POA: Insufficient documentation

## 2013-01-27 DIAGNOSIS — Z7901 Long term (current) use of anticoagulants: Secondary | ICD-10-CM

## 2013-01-27 DIAGNOSIS — I428 Other cardiomyopathies: Secondary | ICD-10-CM | POA: Insufficient documentation

## 2013-01-27 DIAGNOSIS — J449 Chronic obstructive pulmonary disease, unspecified: Secondary | ICD-10-CM | POA: Insufficient documentation

## 2013-01-27 DIAGNOSIS — I429 Cardiomyopathy, unspecified: Secondary | ICD-10-CM

## 2013-01-27 DIAGNOSIS — I517 Cardiomegaly: Secondary | ICD-10-CM

## 2013-01-27 DIAGNOSIS — R072 Precordial pain: Secondary | ICD-10-CM | POA: Insufficient documentation

## 2013-01-27 DIAGNOSIS — I513 Intracardiac thrombosis, not elsewhere classified: Secondary | ICD-10-CM

## 2013-01-27 DIAGNOSIS — J4489 Other specified chronic obstructive pulmonary disease: Secondary | ICD-10-CM | POA: Insufficient documentation

## 2013-01-27 DIAGNOSIS — I509 Heart failure, unspecified: Secondary | ICD-10-CM | POA: Insufficient documentation

## 2013-01-27 DIAGNOSIS — I2510 Atherosclerotic heart disease of native coronary artery without angina pectoris: Secondary | ICD-10-CM | POA: Insufficient documentation

## 2013-01-27 DIAGNOSIS — I219 Acute myocardial infarction, unspecified: Secondary | ICD-10-CM

## 2013-01-27 DIAGNOSIS — Z87891 Personal history of nicotine dependence: Secondary | ICD-10-CM | POA: Insufficient documentation

## 2013-01-27 LAB — POCT INR: INR: 2.1

## 2013-01-27 NOTE — Progress Notes (Signed)
*  PRELIMINARY RESULTS* Echocardiogram 2D Echocardiogram has been performed.  Danny [name] 01/27/2013, 3:38 PM

## 2013-01-28 ENCOUNTER — Telehealth: Payer: Self-pay | Admitting: *Deleted

## 2013-01-28 ENCOUNTER — Ambulatory Visit: Payer: Managed Care, Other (non HMO) | Admitting: Cardiology

## 2013-01-28 NOTE — Telephone Encounter (Signed)
Spoke to pt to advise results/instructions. Pt understood.  

## 2013-01-28 NOTE — Telephone Encounter (Signed)
Pt informed of echo results and wanted to clarify if the clot is smaller/gone or whatever per noted pt does not have f/u until 03-2013, pt was read verbatim as follows:  Reviewed report. LVEF is stable in the range of 40-45%. Severely dilated and hypokinetic RV as noted previously with evidence of persistent RV thrombus. As we already discussed at his recent visit, he should continue anticoagulation.  Pt also advised certain tests show certain areas, pt understood

## 2013-01-28 NOTE — Telephone Encounter (Signed)
I reviewed the images and compared the two studies. Although there are inherent differences in technique between the two studies, the apparent RV thrombus is less prominent on the most recent study, particularly in the anterior apical aspect of the RV. This would suggest some resolution, however continuing anticoagulation is still important.

## 2013-01-29 NOTE — Progress Notes (Signed)
48 hour Holter Monitor in progress. Ends on 01-29-2913 @ 3:30.

## 2013-02-04 ENCOUNTER — Encounter: Payer: Self-pay | Admitting: Emergency Medicine

## 2013-02-04 ENCOUNTER — Ambulatory Visit (INDEPENDENT_AMBULATORY_CARE_PROVIDER_SITE_OTHER): Payer: Managed Care, Other (non HMO) | Admitting: Emergency Medicine

## 2013-02-04 VITALS — BP 120/78 | HR 45 | Ht 67.0 in | Wt 168.4 lb

## 2013-02-04 DIAGNOSIS — J449 Chronic obstructive pulmonary disease, unspecified: Secondary | ICD-10-CM

## 2013-02-04 NOTE — Progress Notes (Signed)
Subjective:    Patient ID: Danny [name], male    DOB: 12/10/1955, 57 y.o.   MRN: [id number] HPI Danny [name] is a 57 year old gentleman with a history of COPD and bullous emphysema status post bilateral bullectomies in  2002.    01/15/2008--Complains of 2 weeks of cough, congestion, blood tinged initially. Went to ER CXR and CT chest showed 4 R broken ribs, negative for PE. bilateral scarring, and questionable opacities. Started on Avelox for 4 days.(per pt ). Some better but still has coughing fits with thick mucus. and DOE, no energy. Pain in ribs with coughng. finished avelox 2 days. ago.   02/04/08 - returns today for f/u, has completed avelox. CP is better. Breathing is better. Cough and hemoptysis have resolved. Seldom uses ProAir.   ROV 06/01/09 -- Returns for f/u, last seen 2009. His breathing has been stable. Tells me he ran out of Advair about 2 weeks ago, noticed that his SABA use increased some. Has had some anxiety at night about not getting his meds but breathing ok. No exacerbations since last visit. No smoking. No cough or hemoptysis.   ROV 09/05/10 -- Hx COPD, emphysema and s/p bullectomies. Follows up for annual check. No flares since last visit. Uses Advair bid, ProAir prn. He believes he would miss the Advair if stopped. Uses ProAir once every several days. Occas wheze, no real cough. Occas gray sputum. No CP. Needs his meds refilled.   ROV 02/07/11 -- COPD, bullous emphysema. Presents today c/o more congestion, thick sputum, over about 5 months. He has been more active, and wonders if this is a cause. The mucous if thick yellow. Still able to work. Denies much dyspnea. No exacerbations. Uses his SABA a couple times a week.   ROV 10/23/11 -- COPD, bullous emphysema. He tried mucinex since last time. He has had some LE/toe neuropathic changes, ? Some rash on fingers. He stopped the mucinex and the sx are better. Remains on Advair. Uses albuterol rarely.  Coughs but not every day. Some DOE,  happens almost every day.   ROV 04/24/12 -- COPD, bullous emphysema s/p bullectomies. Returns for f/u.  He had a recent URI that caused probable AE, was rx with abx, also started on a trial of Spiriva x 1 week. He was already on Advair. The URI sx are better. Uses SABA typically 1-2 x a day. He is trying to get back to exercising.   ROV 05/10/12 -- COPD, bullous emphysema s/p bullectomies. Was recently evaluated w cardiac cath at [facility], had NSTEMI and cath >> R dominant system, stent placed in the circumflex. LVEDP was normal, RV was dilated. He is improved, was discharged on O2. His Spiriva was stopped and he was changed to DuoNebs during that hospitalization. His Advair was continued.  Feels drained but stable  ROV 06/25/12 -- COPD, bullous emphysema s/p bullectomies. CAD w hx NSTEMI. Doing cardiopulm rehab.  He has seen Dr Eden Emms to establish in [place]. He remains on Advair + Duonebs. He didn't tolerate spiriva. 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 He is compliant with his O2.   ROV 09/26/12 -- COPD, bullous emphysema s/p bullectomies. CAD w hx NSTEMI, secondary PAH. He reports today that he has had more dyspnea, seems to be associated with some epigastric, lower chest fullness or pain. He reports desats with O2 at 3L/min when exercising. He is on prednisone 5, started by Dr Janna Arch,  that he is using prn. He feels that it is helping with his pain.   ROV 02/04/13 -- COPD, bullous emphysema s/p bullectomies. CAD w hx NSTEMI, secondary PAH. He has a CM + RV dysfunction, mural thrombus on coumadin. On presentation today he is hypoxic. He describes increase SOB and cough over the last few months. He denies any acute exacerbations he remains on Advair, duonebs 2-3x a day. He has prednisone that is available to use prn for pain - not currently on any. He wears BiPAP every night. He had a CT scan in 10/23/12 for dyspnea and CP.     Objective:   Physical Exam Filed Vitals:   02/04/13 1555  BP: 120/78  Pulse: 45  Height: 5\' 7"  (1.702 m)  Weight: 168 lb 6.4 oz (76.386 kg)  SpO2: 91%   Gen: Pleasant, well-nourished, in no distress,  normal affect  ENT: No lesions,  mouth clear,  oropharynx clear, no postnasal drip  Neck: No JVD, no TMG, no carotid bruits  Lungs: No use of accessory muscles, no dullness to percussion, clear without rales or rhonchi  Cardiovascular: RRR, heart sounds normal, no murmur or gallops, trace edema > much improved  Musculoskeletal: No deformities, no cyanosis or clubbing  Neuro: alert, non focal  Skin: Warm, no lesions or rashes   10/23/12 --  Comparison: Chest radiograph, 10/23/2012. Chest CT, 08/04/2008.  Findings: There is no evidence of a pulmonary embolism.  The heart is enlarged, but stable. There is a short left coronary  artery stents. The no mediastinal or hilar masses are appreciated.  Fascia on the right subcarinal lymph node is mildly enlarged  measuring 14 mm in short axis. There are prominent hilar lymph  nodes bilaterally are not well defined.  There are changes of emphysema and lung scarring. There is a new 8  mm irregular focal opacity in the left upper lobe that may reflect  additional scarring. Neoplastic disease is possible. There is  some ground-glass type opacity in the posterior lateral right lower  lobe which was not present previously. This is somewhat  nonspecific. It could reflect an alveolitis. No other evidence of  infection/inflammation. No other change from the prior CT within  the lungs.  There are multiple old rib fractures on the right.  Limited evaluation of the upper abdomen is unremarkable.  There are minor degenerative changes of the thoracic spine. No  osteoblastic or osteolytic lesions.  IMPRESSION:  No evidence of a pulmonary embolism.  Significant emphysema and areas of lung scarring.  Mild ground-glass opacity in the right  lower lobe, new from the  prior study. Consider an acute inflammatory or infectious  alveolitis in the proper clinical setting.  8 mm focal opacity in the left upper lobe is new from prior study.    Assessment & Plan:  COPD (chronic obstructive pulmonary disease) Continue current regimen iincluding O2 and nocturnal Bipap We will repeat Ct scan in 10/2013   Levy Pupa, MD, PhD 02/04/2013, 4:30 PM [facility] [phone] or if no answer [phone]

## 2013-02-04 NOTE — Patient Instructions (Signed)
Please continue your inhaled medications as you are taking them Wear your oxygen at all times Wear your BiPAP every night Follow with Dr Delton Coombes in 4 months or sooner if you have any problems.

## 2013-02-04 NOTE — Assessment & Plan Note (Signed)
Continue current regimen iincluding O2 and nocturnal Bipap We will repeat Ct scan in 10/2013

## 2013-02-05 ENCOUNTER — Encounter (HOSPITAL_COMMUNITY): Payer: Self-pay | Admitting: Emergency Medicine

## 2013-02-05 ENCOUNTER — Telehealth: Payer: Self-pay | Admitting: Adult Health

## 2013-02-05 ENCOUNTER — Emergency Department (HOSPITAL_COMMUNITY)
Admission: EM | Admit: 2013-02-05 | Discharge: 2013-02-05 | Disposition: A | Discharge status: Home / Self Care | Payer: Managed Care, Other (non HMO) | Attending: Emergency Medicine | Admitting: Emergency Medicine

## 2013-02-05 DIAGNOSIS — J449 Chronic obstructive pulmonary disease, unspecified: Secondary | ICD-10-CM | POA: Insufficient documentation

## 2013-02-05 DIAGNOSIS — Z79899 Other long term (current) drug therapy: Secondary | ICD-10-CM | POA: Insufficient documentation

## 2013-02-05 DIAGNOSIS — R000 Tachycardia, unspecified: Secondary | ICD-10-CM | POA: Insufficient documentation

## 2013-02-05 DIAGNOSIS — Z87891 Personal history of nicotine dependence: Secondary | ICD-10-CM | POA: Insufficient documentation

## 2013-02-05 DIAGNOSIS — I2510 Atherosclerotic heart disease of native coronary artery without angina pectoris: Secondary | ICD-10-CM | POA: Insufficient documentation

## 2013-02-05 DIAGNOSIS — K219 Gastro-esophageal reflux disease without esophagitis: Secondary | ICD-10-CM | POA: Insufficient documentation

## 2013-02-05 DIAGNOSIS — I252 Old myocardial infarction: Secondary | ICD-10-CM | POA: Insufficient documentation

## 2013-02-05 DIAGNOSIS — R040 Epistaxis: Secondary | ICD-10-CM | POA: Insufficient documentation

## 2013-02-05 DIAGNOSIS — I5022 Chronic systolic (congestive) heart failure: Secondary | ICD-10-CM | POA: Insufficient documentation

## 2013-02-05 DIAGNOSIS — I10 Essential (primary) hypertension: Secondary | ICD-10-CM | POA: Insufficient documentation

## 2013-02-05 DIAGNOSIS — Z7901 Long term (current) use of anticoagulants: Secondary | ICD-10-CM | POA: Insufficient documentation

## 2013-02-05 DIAGNOSIS — J4489 Other specified chronic obstructive pulmonary disease: Secondary | ICD-10-CM | POA: Insufficient documentation

## 2013-02-05 DIAGNOSIS — Z8739 Personal history of other diseases of the musculoskeletal system and connective tissue: Secondary | ICD-10-CM | POA: Insufficient documentation

## 2013-02-05 DIAGNOSIS — Z9861 Coronary angioplasty status: Secondary | ICD-10-CM | POA: Insufficient documentation

## 2013-02-05 LAB — CBC
MCH: 33 pg (ref 26.0–34.0)
Platelets: 212 10*3/uL (ref 150–400)
RBC: 3.64 MIL/uL — ABNORMAL LOW (ref 4.22–5.81)
WBC: 11 10*3/uL — ABNORMAL HIGH (ref 4.0–10.5)

## 2013-02-05 LAB — CBC WITH DIFFERENTIAL/PLATELET
Basophils Absolute: 0 10*3/uL (ref 0.0–0.1)
Eosinophils Relative: 1 % (ref 0–5)
HCT: 42.7 % (ref 39.0–52.0)
Hemoglobin: 14.1 g/dL (ref 13.0–17.0)
Lymphocytes Relative: 27 % (ref 12–46)
Lymphs Abs: 2.7 10*3/uL (ref 0.7–4.0)
MCH: 33.1 pg (ref 26.0–34.0)
MCV: 100.2 fL — ABNORMAL HIGH (ref 78.0–100.0)
Monocytes Absolute: 1 10*3/uL (ref 0.1–1.0)
Neutro Abs: 6.1 10*3/uL (ref 1.7–7.7)
Platelets: 235 10*3/uL (ref 150–400)
RDW: 13.7 % (ref 11.5–15.5)
WBC: 9.8 10*3/uL (ref 4.0–10.5)

## 2013-02-05 LAB — BASIC METABOLIC PANEL
CO2: 30 mEq/L (ref 19–32)
Calcium: 9.5 mg/dL (ref 8.4–10.5)
Chloride: 100 mEq/L (ref 96–112)
GFR calc Af Amer: 67 mL/min — ABNORMAL LOW (ref >90)
Glucose, Bld: 129 mg/dL — ABNORMAL HIGH (ref 70–99)
Potassium: 3.8 mEq/L (ref 3.5–5.1)
Sodium: 139 mEq/L (ref 135–145)

## 2013-02-05 LAB — APTT: aPTT: 32 seconds (ref 24–37)

## 2013-02-05 LAB — PROTIME-INR
INR: 1.84 — ABNORMAL HIGH (ref 0.00–1.49)
Prothrombin Time: 20.7 seconds — ABNORMAL HIGH (ref 11.6–15.2)

## 2013-02-05 MED ORDER — SODIUM CHLORIDE 0.9 % IV BOLUS (SEPSIS)
1000.0000 mL | Freq: Once | INTRAVENOUS | Status: AC
  Administered 2013-02-05: 1000 mL via INTRAVENOUS

## 2013-02-05 MED ORDER — LIDOCAINE-EPINEPHRINE (PF) 1 %-1:200000 IJ SOLN
INTRAMUSCULAR | Status: AC
  Administered 2013-02-05: 14:00:00
  Filled 2013-02-05: qty 10

## 2013-02-05 MED ORDER — SILVER NITRATE-POT NITRATE 75-25 % EX MISC
CUTANEOUS | Status: AC
  Administered 2013-02-05: 14:00:00
  Filled 2013-02-05: qty 2

## 2013-02-05 MED ORDER — OXYMETAZOLINE HCL 0.05 % NA SOLN
NASAL | Status: AC
  Administered 2013-02-05: 2
  Filled 2013-02-05: qty 15

## 2013-02-05 NOTE — ED Notes (Signed)
Pt ambulated in hallway without difficulty

## 2013-02-05 NOTE — ED Notes (Signed)
Pt reports nosebleed for the last 1 1/2 hours.  Is on blood thinners.  Bleeding from right nare.

## 2013-02-05 NOTE — ED Notes (Signed)
Pt reports nose bleeding from right nare which began approximately 1-2 hours prior to arrival. Pt is on two different blood thinners and has had same symptoms in September. Pt states he was admitted into hospital.

## 2013-02-05 NOTE — Telephone Encounter (Signed)
Please call pt's wife regarding results of holter monitor/tgs

## 2013-02-05 NOTE — ED Provider Notes (Signed)
CSN: [id number]     Arrival date & time 02/05/13  4098 History  This chart was scribed for Charles B. Bernette Mayers, MD by Quintella Reichert, ED scribe.  This patient was seen in room APA07/APA07 and the patient's care was started at 11:01 AM.   Chief Complaint  Patient presents with  . Epistaxis    The history is provided by the patient. No language interpreter was used.    HPI Comments: Danny [name] is a 57 y.o. male with h/o right ventricular mural thrombus (on Coumadin and Effient), chronic systolic heart failure, COPD, MI, cardiomyopathy and HTN who presents to the Emergency Department complaining of 3 hours of persistent bleeding from the right nostril.  Pt states that he sprayed Afrin in his nose 2 times and has been placing gauze in the nostril, which slowed the bleeding slightly but never stopped it.  Blood is draining down the back of his throat.  Pt states he has lost less blood today than in his previous nosebleed on 9/22.  He was seen in the ED on that day and bleeding was succesfully stopped by packing.  However he is on chronic oxygen and with air going in only one nostril he became hypoxemic and had to be admitted.  He was referred to see Dr. Pollyann Kennedy after stabilizing in the hospital and was told to return and see him again if this recurred.  However Dr. Pollyann Kennedy is in the OR currently so pt came to the ED.  Pt is not on aspirin.  He states his BP has been doing well for the past several days.   Past Medical History  Diagnosis Date  . GERD (gastroesophageal reflux disease)   . COPD (chronic obstructive pulmonary disease)   . Myocardial infarction 05/02/12  . Chronic systolic heart failure   . Essential hypertension, benign   . Cardiomyopathy     LVEF 40-45%  . Right ventricular mural thrombus     On Coumadin  . Cor pulmonale     Severe RV dysfunction  . Coronary atherosclerosis of native coronary artery     DES to OM - [facility]    Past Surgical History  Procedure  Laterality Date  . 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 Coronary angioplasty with stent placement      No family history on file.   History  Substance Use Topics  . Smoking status: Former Smoker -- 2.00 packs/day for 25 years    Types: Cigarettes    Quit date: 04/03/1994  . Smokeless tobacco: Never Used  . Alcohol Use: No     Review of Systems A complete 10 system review of systems was obtained and all systems are negative except as noted in the HPI and PMH.    Allergies  Lipitor  Home Medications   Current Outpatient Rx  Name  Route  Sig  Dispense  Refill  . albuterol (PROVENTIL HFA;VENTOLIN HFA) 108 (90 BASE) MCG/ACT inhaler   Inhalation   Inhale 2 puffs into the lungs every 6 (six) hours as needed for wheezing.         . carvedilol (COREG) 6.25 MG tablet   Oral   Take 1 tablet (6.25 mg total) by mouth 2 (two) times daily.   180 tablet   3   . cloNIDine (CATAPRES) 0.1 MG tablet   Oral   Take 1 tablet (0.1 mg total) by mouth 3 (three) times daily.   60 tablet  11   . Fluticasone-Salmeterol (ADVAIR) 250-50 MCG/DOSE AEPB   Inhalation   Inhale 1 puff into the lungs every 12 (twelve) hours.         . furosemide (LASIX) 40 MG tablet   Oral   Take 1 tablet (40 mg total) by mouth daily.   30 tablet   6   . ipratropium-albuterol (DUONEB) 0.5-2.5 (3) MG/3ML SOLN   Nebulization   Take 3 mLs by nebulization every 6 (six) hours as needed (Shortness of breath).         . losartan (COZAAR) 100 MG tablet      1 tablet daily.         Marland Kitchen omeprazole (PRILOSEC) 20 MG capsule   Oral   Take 20 mg by mouth 2 (two) times daily.          Marland Kitchen oxyCODONE (ROXICODONE) 5 MG immediate release tablet   Oral   Take 1 tablet (5 mg total) by mouth every 4 (four) hours as needed for pain.   30 tablet   0   . potassium chloride (K-DUR) 10 MEQ tablet   Oral   Take 20 mEq by mouth at bedtime.         . prasugrel (EFFIENT) 10 MG TABS   Oral    Take 10 mg by mouth daily.         . predniSONE (DELTASONE) 20 MG tablet   Oral   Take 20 mg by mouth as needed.          . ramipril (ALTACE) 5 MG capsule   Oral   Take 1 capsule (5 mg total) by mouth daily.   30 capsule   1   . rosuvastatin (CRESTOR) 10 MG tablet   Oral   Take 1 tablet (10 mg total) by mouth daily at 6 PM.   30 tablet   1   . warfarin (COUMADIN) 2.5 MG tablet   Oral   Take 3.75 mg by mouth daily.           BP 95/73  Pulse 128  Temp(Src) 98.2 F (36.8 C) (Oral)  Resp 24  Ht 5\' 7"  (1.702 m)  Wt 166 lb (75.297 kg)  BMI 25.99 kg/m2  SpO2 90%  Physical Exam  Nursing note and vitals reviewed. Constitutional: He is oriented to person, place, and time. He appears well-developed and well-nourished.  HENT:  Head: Normocephalic and atraumatic.  Nose: Epistaxis is observed.  Right anterior epistaxis  Eyes: EOM are normal. Pupils are equal, round, and reactive to light.  Neck: Normal range of motion. Neck supple.  Cardiovascular: Normal heart sounds and intact distal pulses.  Tachycardia present.   Pulmonary/Chest: Effort normal and breath sounds normal.  Abdominal: Bowel sounds are normal. He exhibits no distension. There is no tenderness.  Musculoskeletal: Normal range of motion. He exhibits no edema and no tenderness.  Neurological: He is alert and oriented to person, place, and time. He has normal strength. No cranial nerve deficit or sensory deficit.  Skin: Skin is warm and dry. No rash noted.  Psychiatric: He has a normal mood and affect.    ED Course  Procedures (including critical care time)  DIAGNOSTIC STUDIES: Oxygen Saturation is 90% on [place], low by my interpretation.    COORDINATION OF CARE: 11:10 AM-Discussed treatment plan with pt at bedside and pt agreed to plan.    Labs Review Labs Reviewed  CBC WITH DIFFERENTIAL - Abnormal; Notable for the following:    MCV  100.2 (*)    All other components within normal limits  BASIC  METABOLIC PANEL - Abnormal; Notable for the following:    Glucose, Bld 129 (*)    GFR calc non Af Amer 58 (*)    GFR calc Af Amer 67 (*)    All other components within normal limits  PROTIME-INR - Abnormal; Notable for the following:    Prothrombin Time 20.7 (*)    INR 1.84 (*)    All other components within normal limits  CBC - Abnormal; Notable for the following:    WBC 11.0 (*)    RBC 3.64 (*)    Hemoglobin 12.0 (*)    HCT 36.6 (*)    MCV 100.5 (*)    All other components within normal limits  APTT    Imaging Review No results found.  EKG Interpretation   None       MDM   1. Epistaxis     Pt with clots in R nare blown out. Afrin infused and pressure held. Bleeding improved but still oozing from a location on R anterior nasal septum. Given recent history, blood thinners and chronic home O2 doubt that cautery will be successful. Pt agrees to packing and so rhino rocket was placed and inflated. HR improved while resting pt states it always goes up when he is nervous. However BP has dropped. Will give an additional liter of IVF and recheck H/H.   3:40 PM Pt feeling better. Decrease in Hgb may be partly dilutional after IVF but hemodynamically improved and he wants to go home. Ambulated in the ED without dizziness. ADvised to followup with Dr. Pollyann Kennedy and to return to the [facility] ED if symptoms worsen.   I personally performed the services described in this documentation, which was scribed in my presence. The recorded information has been reviewed and is accurate.      Charles B. Bernette Mayers, MD 02/05/13 [phone]

## 2013-02-05 NOTE — Telephone Encounter (Signed)
.  left message to have patient return my call.  

## 2013-02-06 MED ORDER — DIGOXIN 125 MCG PO TABS: 0.1250 mg | ORAL_TABLET | Freq: Every day | ORAL | Status: DC

## 2013-02-06 NOTE — Telephone Encounter (Signed)
Pt wife wanted to see if there were any results from the holter monitor, this nurse read Dr. Diona Browner notations and the pt wanted to confirm if he needs to come in to discuss the possible increase in coreg/dig therapy before the apt noted on 04-01-13 at 2pm, the pt also wanted to make Dr. Diona Browner aware he was again seen in the ED yesterday for uncontrollable nose bleeds, please advise

## 2013-02-06 NOTE — Telephone Encounter (Signed)
Spoke to pt to advise results/instructions. Pt understood. New RX sent to pharmacy Apt scheduled for 02-24-13 10:20am for SM Pt advised he is noting runny nose(not bloody) runny and itchy eyes and slightly labored breathing started when the packing was placed in his nose yesterday so the pt wanted to know if there is any allergy medication he can take, pt is aware last time he had nose packed this also caused watery eyes but did not itchy eyes, please advise

## 2013-02-06 NOTE — Telephone Encounter (Signed)
I would like to try and add digoxin to his current regimen and see how he does on a combination of Coreg and digoxin, can then arrange an office visit within the next few weeks. Let's start with digoxin 0.125 mg once a day.

## 2013-02-07 NOTE — Telephone Encounter (Signed)
Spoke to pt to advise results/instructions. Pt understood.  

## 2013-02-07 NOTE — Telephone Encounter (Signed)
Allergy questions might be better referred to his primary care provider. In general, he should be careful using decongestants that might cause vasoconstriction or increase in his heart rate. Anti-histamines such as plain Allegra or Zyrtec might be reasonable.

## 2013-02-10 ENCOUNTER — Telehealth: Payer: Self-pay

## 2013-02-10 NOTE — Telephone Encounter (Signed)
Pt c/o heart 42- 60 this am with c/o dizziness but did not check pulse while dizzy.Is O2 dependant and has machine that reports heart rate.Also reports heart rate up to 120 but doesn't know how to check his pulse .Recently started on Digoxin and Coreg. Spoke with Dr.McDowell.Patient encouraged to remain on daily dose of meds and f/u as planned. No office visit needs at this point.Pt agrees with dispo   C.Les Pou  RN

## 2013-02-11 ENCOUNTER — Telehealth: Payer: Self-pay

## 2013-02-11 NOTE — Telephone Encounter (Signed)
Received call from pt's wife, Shirlee Limerick,  concerned that spouse is more SOB and tired.Wife reports pt is early stages with [facility] for posible lung transplant.Incresed O2 at home up to 3 1/2 to 4 liters via nasal canula with sats of 90% .reports weight today as 167 lbs which is one pound over since his last visit  in cardiology. Wife has pt take extra lasix today,pt still feels tired. Wife says she will call Dr.Hawkins to get a sooner appt than decemberSpoke with K.Lawrence NP,who feels patient should follow up with pulmonologist,Dr.Hawkins and if she becomes more concerned or pt's condition becomes worse to got to the ED for evaluation.  Attempted reach wife on her cell phone [phone] and it went to voicemail  Had opportunity to speak with Dr.McDowell, advised patient to go to [facility] ED as    visit would probably serve pt better as the have pulmonology,EP specialists etc.Wife recalled and agreed with dispo   C.Les Pou RN

## 2013-02-12 ENCOUNTER — Telehealth: Payer: Self-pay | Admitting: Emergency Medicine

## 2013-02-12 ENCOUNTER — Other Ambulatory Visit: Payer: Self-pay | Admitting: *Deleted

## 2013-02-12 ENCOUNTER — Ambulatory Visit (INDEPENDENT_AMBULATORY_CARE_PROVIDER_SITE_OTHER): Payer: Managed Care, Other (non HMO) | Admitting: *Deleted

## 2013-02-12 DIAGNOSIS — R0600 Dyspnea, unspecified: Secondary | ICD-10-CM

## 2013-02-12 DIAGNOSIS — J449 Chronic obstructive pulmonary disease, unspecified: Secondary | ICD-10-CM

## 2013-02-12 DIAGNOSIS — I2510 Atherosclerotic heart disease of native coronary artery without angina pectoris: Secondary | ICD-10-CM

## 2013-02-12 DIAGNOSIS — Z7901 Long term (current) use of anticoagulants: Secondary | ICD-10-CM

## 2013-02-12 DIAGNOSIS — I219 Acute myocardial infarction, unspecified: Secondary | ICD-10-CM

## 2013-02-12 DIAGNOSIS — I513 Intracardiac thrombosis, not elsewhere classified: Secondary | ICD-10-CM

## 2013-02-12 DIAGNOSIS — I10 Essential (primary) hypertension: Secondary | ICD-10-CM

## 2013-02-12 LAB — POCT INR: INR: 2.1

## 2013-02-12 MED ORDER — FUROSEMIDE 40 MG PO TABS: 40.0000 mg | ORAL_TABLET | Freq: Every day | ORAL | Status: DC

## 2013-02-12 NOTE — Telephone Encounter (Signed)
Pt seen in ER x3 (on 9/22, 10/21 and 11/5).  Last cxr in system on 10.21.14 was worrisome for PNA Pt seen by RB on 11.4.14: Patient Instructions    Please continue your inhaled medications as you are taking them  Wear your oxygen at all times  Wear your BiPAP every night  Follow with Dr Delton Coombes in 4 months or sooner if you have any problems   Called spoke with patient's spouse who reports that pt has been having prod cough with light yellow mucus mixed with BRB x2 episodes today at about "1/2 dime-size" each time with increased SOB, wheezing, chest tightness and some nausea.  Also reports sats dropping into the 80s w/ exertion on 3-4 lpm, his lungs "burn" when on 4lpm but unable to maintain sats on 2lpm, weight gain, retaining "a lot of fluid" and has increased lasix.  Denies any f/c/s, vomiting.  Per spouse, pt sees Dr Juanetta Gosling PCP in [place] who strongly recommends that pt try to get a lung transplant at [facility].  Pt also sees Dr Diona Browner in [place] who reportedly feels that all of patient's symptoms "are lung related."  Patient wants all doctors on the same page.    RB scheduled off until 11.17.14 MR with openings tomorrow >> scheduled for 12N Spouse to stop by PCP and get copy of recent notes Advised spouse to seek emergency attention if patient's symptoms worsen prior to appt (spouse stated she will bring to [facility] if ER is necessary) Will sign and forward to RB and MR as FYI.

## 2013-02-12 NOTE — Telephone Encounter (Signed)
Ok thanks 

## 2013-02-13 ENCOUNTER — Inpatient Hospital Stay (HOSPITAL_COMMUNITY): Payer: Managed Care, Other (non HMO)

## 2013-02-13 ENCOUNTER — Ambulatory Visit (INDEPENDENT_AMBULATORY_CARE_PROVIDER_SITE_OTHER): Payer: Managed Care, Other (non HMO) | Admitting: Internal Medicine

## 2013-02-13 ENCOUNTER — Inpatient Hospital Stay (HOSPITAL_COMMUNITY)
Admission: AD | Admit: 2013-02-13 | Discharge: 2013-02-18 | DRG: 190 | Disposition: A | Discharge status: Home / Self Care | Payer: Managed Care, Other (non HMO) | Source: Ambulatory Visit | Attending: Internal Medicine | Admitting: Internal Medicine

## 2013-02-13 ENCOUNTER — Telehealth: Payer: Self-pay | Admitting: Cardiology

## 2013-02-13 ENCOUNTER — Encounter (HOSPITAL_COMMUNITY): Payer: Self-pay | Admitting: *Deleted

## 2013-02-13 ENCOUNTER — Encounter: Payer: Self-pay | Admitting: Internal Medicine

## 2013-02-13 VITALS — BP 102/64 | HR 81 | Temp 98.0°F | Ht 67.0 in | Wt 166.8 lb

## 2013-02-13 DIAGNOSIS — R000 Tachycardia, unspecified: Secondary | ICD-10-CM | POA: Diagnosis present

## 2013-02-13 DIAGNOSIS — I498 Other specified cardiac arrhythmias: Secondary | ICD-10-CM | POA: Diagnosis present

## 2013-02-13 DIAGNOSIS — Z9861 Coronary angioplasty status: Secondary | ICD-10-CM

## 2013-02-13 DIAGNOSIS — I10 Essential (primary) hypertension: Secondary | ICD-10-CM

## 2013-02-13 DIAGNOSIS — I2510 Atherosclerotic heart disease of native coronary artery without angina pectoris: Secondary | ICD-10-CM | POA: Diagnosis present

## 2013-02-13 DIAGNOSIS — R042 Hemoptysis: Secondary | ICD-10-CM | POA: Diagnosis present

## 2013-02-13 DIAGNOSIS — I4949 Other premature depolarization: Secondary | ICD-10-CM | POA: Diagnosis present

## 2013-02-13 DIAGNOSIS — I5189 Other ill-defined heart diseases: Secondary | ICD-10-CM | POA: Diagnosis present

## 2013-02-13 DIAGNOSIS — J449 Chronic obstructive pulmonary disease, unspecified: Secondary | ICD-10-CM

## 2013-02-13 DIAGNOSIS — N182 Chronic kidney disease, stage 2 (mild): Secondary | ICD-10-CM | POA: Diagnosis present

## 2013-02-13 DIAGNOSIS — J4489 Other specified chronic obstructive pulmonary disease: Secondary | ICD-10-CM

## 2013-02-13 DIAGNOSIS — I429 Cardiomyopathy, unspecified: Secondary | ICD-10-CM

## 2013-02-13 DIAGNOSIS — J9620 Acute and chronic respiratory failure, unspecified whether with hypoxia or hypercapnia: Secondary | ICD-10-CM | POA: Diagnosis present

## 2013-02-13 DIAGNOSIS — I471 Supraventricular tachycardia, unspecified: Secondary | ICD-10-CM | POA: Diagnosis present

## 2013-02-13 DIAGNOSIS — I219 Acute myocardial infarction, unspecified: Secondary | ICD-10-CM

## 2013-02-13 DIAGNOSIS — Z87891 Personal history of nicotine dependence: Secondary | ICD-10-CM

## 2013-02-13 DIAGNOSIS — J441 Chronic obstructive pulmonary disease with (acute) exacerbation: Principal | ICD-10-CM | POA: Diagnosis present

## 2013-02-13 DIAGNOSIS — I2789 Other specified pulmonary heart diseases: Secondary | ICD-10-CM | POA: Diagnosis present

## 2013-02-13 DIAGNOSIS — I513 Intracardiac thrombosis, not elsewhere classified: Secondary | ICD-10-CM

## 2013-02-13 DIAGNOSIS — K5900 Constipation, unspecified: Secondary | ICD-10-CM | POA: Diagnosis present

## 2013-02-13 DIAGNOSIS — I509 Heart failure, unspecified: Secondary | ICD-10-CM | POA: Diagnosis present

## 2013-02-13 DIAGNOSIS — Z7901 Long term (current) use of anticoagulants: Secondary | ICD-10-CM

## 2013-02-13 DIAGNOSIS — I4891 Unspecified atrial fibrillation: Secondary | ICD-10-CM | POA: Diagnosis present

## 2013-02-13 DIAGNOSIS — K219 Gastro-esophageal reflux disease without esophagitis: Secondary | ICD-10-CM | POA: Diagnosis present

## 2013-02-13 DIAGNOSIS — I272 Pulmonary hypertension, unspecified: Secondary | ICD-10-CM

## 2013-02-13 DIAGNOSIS — Z79899 Other long term (current) drug therapy: Secondary | ICD-10-CM

## 2013-02-13 DIAGNOSIS — I2609 Other pulmonary embolism with acute cor pulmonale: Secondary | ICD-10-CM | POA: Diagnosis present

## 2013-02-13 DIAGNOSIS — I129 Hypertensive chronic kidney disease with stage 1 through stage 4 chronic kidney disease, or unspecified chronic kidney disease: Secondary | ICD-10-CM | POA: Diagnosis present

## 2013-02-13 DIAGNOSIS — I5022 Chronic systolic (congestive) heart failure: Secondary | ICD-10-CM | POA: Diagnosis present

## 2013-02-13 DIAGNOSIS — I252 Old myocardial infarction: Secondary | ICD-10-CM

## 2013-02-13 DIAGNOSIS — R002 Palpitations: Secondary | ICD-10-CM

## 2013-02-13 DIAGNOSIS — Z9981 Dependence on supplemental oxygen: Secondary | ICD-10-CM

## 2013-02-13 HISTORY — DX: Other pneumothorax: J93.83

## 2013-02-13 LAB — URINALYSIS, ROUTINE W REFLEX MICROSCOPIC
Glucose, UA: NEGATIVE mg/dL
Hgb urine dipstick: NEGATIVE
Ketones, ur: NEGATIVE mg/dL
Nitrite: NEGATIVE
Protein, ur: NEGATIVE mg/dL
Urobilinogen, UA: 1 mg/dL (ref 0.0–1.0)

## 2013-02-13 LAB — COMPREHENSIVE METABOLIC PANEL
ALT: 14 U/L (ref 0–53)
AST: 15 U/L (ref 0–37)
Albumin: 3.6 g/dL (ref 3.5–5.2)
CO2: 32 mEq/L (ref 19–32)
Calcium: 9.9 mg/dL (ref 8.4–10.5)
Chloride: 98 mEq/L (ref 96–112)
Creatinine, Ser: 1.42 mg/dL — ABNORMAL HIGH (ref 0.50–1.35)
Sodium: 138 mEq/L (ref 135–145)
Total Bilirubin: 0.7 mg/dL (ref 0.3–1.2)
Total Protein: 7.2 g/dL (ref 6.0–8.3)

## 2013-02-13 LAB — BLOOD GAS, ARTERIAL
Drawn by: 257701
O2 Content: 2.5 L/min
Patient temperature: 98.6
TCO2: 27.1 mmol/L (ref 0–100)
pCO2 arterial: 45 mmHg (ref 35.0–45.0)
pH, Arterial: 7.443 (ref 7.350–7.450)

## 2013-02-13 LAB — STREP PNEUMONIAE URINARY ANTIGEN: Strep Pneumo Urinary Antigen: NEGATIVE

## 2013-02-13 LAB — CBC WITH DIFFERENTIAL/PLATELET
Eosinophils Absolute: 0.1 10*3/uL (ref 0.0–0.7)
Eosinophils Relative: 1 % (ref 0–5)
HCT: 38.3 % — ABNORMAL LOW (ref 39.0–52.0)
Hemoglobin: 12.7 g/dL — ABNORMAL LOW (ref 13.0–17.0)
Lymphs Abs: 2.5 10*3/uL (ref 0.7–4.0)
MCH: 33 pg (ref 26.0–34.0)
MCV: 99.5 fL (ref 78.0–100.0)
Monocytes Absolute: 1.1 10*3/uL — ABNORMAL HIGH (ref 0.1–1.0)
Monocytes Relative: 12 % (ref 3–12)
RBC: 3.85 MIL/uL — ABNORMAL LOW (ref 4.22–5.81)
WBC: 9 10*3/uL (ref 4.0–10.5)

## 2013-02-13 LAB — AMYLASE: Amylase: 124 U/L — ABNORMAL HIGH (ref 0–105)

## 2013-02-13 LAB — PHOSPHORUS: Phosphorus: 3.2 mg/dL (ref 2.3–4.6)

## 2013-02-13 LAB — PROCALCITONIN: Procalcitonin: <0.1 ng/mL

## 2013-02-13 LAB — TROPONIN I
Troponin I: <0.3 ng/mL (ref <0.30)
Troponin I: <0.3 ng/mL (ref <0.30)

## 2013-02-13 LAB — LIPASE, BLOOD: Lipase: 37 U/L (ref 11–59)

## 2013-02-13 LAB — DIGOXIN LEVEL: Digoxin Level: 0.7 ng/mL — ABNORMAL LOW (ref 0.8–2.0)

## 2013-02-13 LAB — MAGNESIUM: Magnesium: 2 mg/dL (ref 1.5–2.5)

## 2013-02-13 MED ORDER — LOSARTAN POTASSIUM 50 MG PO TABS
100.0000 mg | ORAL_TABLET | Freq: Every day | ORAL | Status: DC
  Administered 2013-02-14 – 2013-02-18 (×5): 100 mg via ORAL
  Filled 2013-02-13 (×6): qty 2

## 2013-02-13 MED ORDER — PIPERACILLIN-TAZOBACTAM 3.375 G IVPB
3.3750 g | Freq: Three times a day (TID) | INTRAVENOUS | Status: DC
  Administered 2013-02-14 (×2): 3.375 g via INTRAVENOUS
  Filled 2013-02-13 (×3): qty 50

## 2013-02-13 MED ORDER — WARFARIN SODIUM 2.5 MG PO TABS
3.7500 mg | ORAL_TABLET | Freq: Once | ORAL | Status: AC
  Administered 2013-02-13: 18:00:00 3.75 mg via ORAL
  Filled 2013-02-13: qty 1

## 2013-02-13 MED ORDER — DIGOXIN 125 MCG PO TABS
0.1250 mg | ORAL_TABLET | Freq: Every day | ORAL | Status: DC
  Administered 2013-02-14 – 2013-02-18 (×5): 0.125 mg via ORAL
  Filled 2013-02-13 (×6): qty 1

## 2013-02-13 MED ORDER — PIPERACILLIN-TAZOBACTAM 3.375 G IVPB
3.3750 g | Freq: Once | INTRAVENOUS | Status: AC
  Administered 2013-02-13: 18:00:00 3.375 g via INTRAVENOUS
  Filled 2013-02-13: qty 50

## 2013-02-13 MED ORDER — VANCOMYCIN HCL IN DEXTROSE 1-5 GM/200ML-% IV SOLN
1000.0000 mg | Freq: Once | INTRAVENOUS | Status: AC
  Administered 2013-02-13: 1000 mg via INTRAVENOUS
  Filled 2013-02-13: qty 200

## 2013-02-13 MED ORDER — ATORVASTATIN CALCIUM 10 MG PO TABS: 10.0000 mg | ORAL_TABLET | Freq: Every day | ORAL | Status: DC

## 2013-02-13 MED ORDER — VANCOMYCIN HCL IN DEXTROSE 750-5 MG/150ML-% IV SOLN
750.0000 mg | Freq: Two times a day (BID) | INTRAVENOUS | Status: DC
  Administered 2013-02-14: 750 mg via INTRAVENOUS
  Filled 2013-02-13: qty 150

## 2013-02-13 MED ORDER — SALINE SPRAY 0.65 % NA SOLN
1.0000 | NASAL | Status: DC | PRN
  Administered 2013-02-13: 1 via NASAL
  Filled 2013-02-13: qty 44

## 2013-02-13 MED ORDER — ASPIRIN 81 MG PO CHEW
324.0000 mg | CHEWABLE_TABLET | ORAL | Status: AC
  Administered 2013-02-13: 324 mg via ORAL
  Filled 2013-02-13: qty 4

## 2013-02-13 MED ORDER — SODIUM CHLORIDE 0.9 % IV SOLN: 250.0000 mL | INTRAVENOUS | Status: DC | PRN

## 2013-02-13 MED ORDER — PRASUGREL HCL 10 MG PO TABS
10.0000 mg | ORAL_TABLET | Freq: Every day | ORAL | Status: DC
  Administered 2013-02-14 – 2013-02-18 (×5): 10 mg via ORAL
  Filled 2013-02-13 (×6): qty 1

## 2013-02-13 MED ORDER — WARFARIN - PHARMACIST DOSING INPATIENT: Freq: Every day | Status: DC

## 2013-02-13 MED ORDER — IPRATROPIUM BROMIDE 0.02 % IN SOLN
0.5000 mg | RESPIRATORY_TRACT | Status: DC
  Administered 2013-02-13 – 2013-02-14 (×5): 0.5 mg via RESPIRATORY_TRACT
  Filled 2013-02-13 (×5): qty 2.5

## 2013-02-13 MED ORDER — ASPIRIN 300 MG RE SUPP
300.0000 mg | RECTAL | Status: AC
  Filled 2013-02-13: qty 1

## 2013-02-13 MED ORDER — METHYLPREDNISOLONE SODIUM SUCC 125 MG IJ SOLR
80.0000 mg | Freq: Three times a day (TID) | INTRAMUSCULAR | Status: DC
  Administered 2013-02-13 – 2013-02-14 (×3): 80 mg via INTRAVENOUS
  Filled 2013-02-13 (×6): qty 1.28

## 2013-02-13 MED ORDER — CLONIDINE HCL 0.1 MG PO TABS
0.1000 mg | ORAL_TABLET | Freq: Three times a day (TID) | ORAL | Status: DC
  Administered 2013-02-13 – 2013-02-18 (×15): 0.1 mg via ORAL
  Filled 2013-02-13 (×17): qty 1

## 2013-02-13 MED ORDER — PANTOPRAZOLE SODIUM 40 MG PO TBEC
40.0000 mg | DELAYED_RELEASE_TABLET | Freq: Every day | ORAL | Status: DC
  Administered 2013-02-13 – 2013-02-18 (×6): 40 mg via ORAL
  Filled 2013-02-13 (×6): qty 1

## 2013-02-13 MED ORDER — CARVEDILOL 6.25 MG PO TABS
6.2500 mg | ORAL_TABLET | Freq: Two times a day (BID) | ORAL | Status: DC
  Administered 2013-02-14 – 2013-02-15 (×3): 6.25 mg via ORAL
  Filled 2013-02-13 (×6): qty 1

## 2013-02-13 MED ORDER — FUROSEMIDE 40 MG PO TABS
40.0000 mg | ORAL_TABLET | Freq: Every day | ORAL | Status: DC
  Administered 2013-02-14 – 2013-02-18 (×5): 40 mg via ORAL
  Filled 2013-02-13 (×6): qty 1

## 2013-02-13 MED ORDER — ALBUTEROL SULFATE (5 MG/ML) 0.5% IN NEBU
2.5000 mg | INHALATION_SOLUTION | RESPIRATORY_TRACT | Status: DC
  Administered 2013-02-13 – 2013-02-14 (×5): 2.5 mg via RESPIRATORY_TRACT
  Filled 2013-02-13 (×5): qty 0.5

## 2013-02-13 MED ORDER — ROSUVASTATIN CALCIUM 10 MG PO TABS
10.0000 mg | ORAL_TABLET | Freq: Every day | ORAL | Status: DC
  Administered 2013-02-13 – 2013-02-17 (×5): 10 mg via ORAL
  Filled 2013-02-13 (×7): qty 1

## 2013-02-13 NOTE — Progress Notes (Signed)
md notified about hr, will consult cardiology and will compare ekg's.

## 2013-02-13 NOTE — Progress Notes (Signed)
ANTICOAGULATION CONSULT NOTE - Initial Consult  Pharmacy Consult for Warfarin Indication: RV mural thrombus  Allergies  Allergen Reactions  . Lipitor [Atorvastatin] Other (See Comments)    Muscle spasms     Patient Measurements: Height: 5\' 7"  (170.2 cm) Weight: 161 lb (73.029 kg) IBW/kg (Calculated) : 66.1  Vital Signs: Temp: 97.1 F (36.2 C) (11/13 1307) Temp src: Oral (11/13 1307) BP: 120/67 mmHg (11/13 1307) Pulse Rate: 40 (11/13 1307)  Labs:  Recent Labs  02/12/13 1109 02/13/13 1334  HGB  --  12.7*  HCT  --  38.3*  PLT  --  261  APTT  --  31  LABPROT  --  20.7*  INR 2.1 1.84*    Estimated Creatinine Clearance: 57.8 ml/min (by C-G formula based on Cr of 1.35).   Medical History: Past Medical History  Diagnosis Date  . GERD (gastroesophageal reflux disease)   . COPD (chronic obstructive pulmonary disease)   . Myocardial infarction 05/02/12  . Chronic systolic heart failure   . Essential hypertension, benign   . Cardiomyopathy     LVEF 40-45%  . Right ventricular mural thrombus     On Coumadin  . Cor pulmonale     Severe RV dysfunction  . Coronary atherosclerosis of native coronary artery     DES to OM [facility]  . Atrial fibrillation     Medications:  Scheduled:  . albuterol  2.5 mg Nebulization Q4H   And  . ipratropium  0.5 mg Nebulization Q4H  . aspirin  324 mg Oral NOW   Or  . aspirin  300 mg Rectal NOW  . cloNIDine  0.1 mg Oral TID  . furosemide  40 mg Oral Daily  . losartan  100 mg Oral Daily  . methylPREDNISolone (SOLU-MEDROL) injection  80 mg Intravenous Q8H  . pantoprazole  40 mg Oral Q1200  . piperacillin-tazobactam (ZOSYN)  IV  3.375 g Intravenous Once  . prasugrel  10 mg Oral Daily  . rosuvastatin  10 mg Oral q1800  . vancomycin  1,000 mg Intravenous Once    Assessment: 57 yo male on chronic warfarin for history RV mural thrombus. Order to continue per Pharmacy protocol on admission.  Home dose is 3.75mg   daily, last dose taken yesterday  INR 2.1 at clinic yesterday, 1.84 today  CBC ok, mild hemoptysis noted - likely due to possible PNA  Patient also takes prasugrel, and now aspirin - monitor closely for bleeding  Goal of Therapy:  INR 2-3   Plan:   Warfarin 3.75mg  po today  Daily PT/INR  Loralee Pacas, PharmD, BCPS Pager: [phone] 02/13/2013,2:14 PM

## 2013-02-13 NOTE — Telephone Encounter (Signed)
[facility] is telling patient's wife that they didn't get new RX for Furosemide yesterday.  It is sent through [facility] / [facility]

## 2013-02-13 NOTE — Telephone Encounter (Signed)
Spoke to patient concerning lab/test results/instructions from provider. Patient understood. Pt will be able to pick up prescription on Nov 15th.

## 2013-02-13 NOTE — Progress Notes (Signed)
ANTIBIOTIC CONSULT NOTE - INITIAL  Pharmacy Consult for Vancomycin and Zosyn Indication: rule out pneumonia  Allergies  Allergen Reactions  . Lipitor [Atorvastatin] Other (See Comments)    Muscle spasms     Patient Measurements: Height: 5\' 7"  (170.2 cm) Weight: 166 lb (75.297 kg) IBW/kg (Calculated) : 66.1  Vital Signs: Temp: 97.1 F (36.2 C) (11/13 1307) Temp src: Oral (11/13 1307) BP: 120/67 mmHg (11/13 1307) Pulse Rate: 40 (11/13 1307) Intake/Output from previous day:   Intake/Output from this shift:    Labs: BMET, CBC, Procalcitonin pending   Medical History: Past Medical History  Diagnosis Date  . GERD (gastroesophageal reflux disease)   . COPD (chronic obstructive pulmonary disease)   . Myocardial infarction 05/02/12  . Chronic systolic heart failure   . Essential hypertension, benign   . Cardiomyopathy     LVEF 40-45%  . Right ventricular mural thrombus     On Coumadin  . Cor pulmonale     Severe RV dysfunction  . Coronary atherosclerosis of native coronary artery     DES to OM [facility]  . Atrial fibrillation    Microbiology: 11/13 blood x2: 11/13 urine: 11/13 urine strep: 11/13 urine legionella  Medications:  Scheduled:  . albuterol  2.5 mg Nebulization Q4H   And  . ipratropium  0.5 mg Nebulization Q4H  . aspirin  324 mg Oral NOW   Or  . aspirin  300 mg Rectal NOW  . cloNIDine  0.1 mg Oral TID  . furosemide  40 mg Oral Daily  . losartan  100 mg Oral Daily  . methylPREDNISolone (SOLU-MEDROL) injection  80 mg Intravenous Q8H  . pantoprazole  40 mg Oral Q1200  . prasugrel  10 mg Oral Daily  . rosuvastatin  10 mg Oral q1800   Assessment: 57 yo male presents from MD office with worsening fatigue, dyspnea, cougn, sputum, mild hemoptysis after starting digoxin a week ago. Has stage IV COPD on home O2, undergoing w/u for lung transplant at [facility]. Note states two previous admissions for PNA, most recent Oct 2014. Vanc/Zosyn ordered  for r/o HAP.  Weight 75.3kg  SCr 1.35 on 11/5, CrCl 58 ml/min (CG), 62 ml/min (normalized)  Labs today pending   Goal of Therapy:  Vancomycin trough level 15-20 mcg/ml  Plan:   Vancomycin 1g IV x 1 Zosyn 3.375gm IV x 1 Follow up labs once available to determine maintenance doses  Loralee Pacas, PharmD, BCPS Pager: [phone] 02/13/2013,1:43 PM

## 2013-02-13 NOTE — Progress Notes (Signed)
Brief Pharmacy Consult Note - vancomycin and Zosyn follow up  Labs: Scr 1.42, CrCl 55  A/P: Vancomycin 1g IV x 1 ordered prior to resulting Scr. With renal function now known, will start vancomycin 750mg  IV q12 thereafter. With CrCl > 20 ml/min, start Zosyn 3.375g IV q8 (extended interval infusion)  Hessie Knows, PharmD, BCPS Pager [phone] 02/13/2013 3:01 PM

## 2013-02-13 NOTE — H&P (Addendum)
PULMONARY  / CRITICAL CARE MEDICINE  Name: Danny [name] MRN: [id number] DOB: 03/01/1956    ADMISSION DATE:  (Not on file) CONSULTATION DATE:  11./13/14  CHIEF COMPLAINT:  worsenig fatigues, dyspnea, cougn, sputum, mild hemoptysis after starting digoxin a week agp    SIGNIFICANT EVENTS / STUDIES:  (Not on file) -- 02/13/13 - admit   CULTURES: Results for orders placed during the hospital encounter of 12/23/12  MRSA PCR SCREENING     Status: None   Collection Time    12/23/12 11:10 PM      Result Value Range Status   MRSA by PCR NEGATIVE  NEGATIVE Final   Comment:            The GeneXpert MRSA Assay (FDA     approved for NASAL specimens     only), is one component of a     comprehensive MRSA colonization     surveillance program. It is not     intended to diagnose MRSA     infection nor to guide or     monitor treatment for     MRSA infections.     ANTIBIOTICS: Anti-infectives   None       HISTORY OF PRESENT ILLNESS:    LENNY FIUMARA has multiple medical problems including Gold stage IV COPD on chronic 2 L oxygen with FEV1 of 28%. COPD is followed both Dr. Delton Coombes and Dr Elder Love. Some talk about lung trnasplant eval. Is also status bullectomy  colectomy for upper lobe emphysema in the remote past. History is provided by him and his wife. The history quality is poor due to his multiple health problems.  It appears that he is had 2 admissions for pneumonia one in August 2014 the other one in September or October 2014. Unclear details. It appears that a week ago he was started on digoxin for a to fibrillation and atrial tachycardia. Subsequent to that he's had increasing fatigue, low heart rate. Is associated with increasing cough, dyspnea, increased sputum volume and also change in sputum color today 2 mild hemoptysis. He does not appear there is worsening edema which he chronically suffers from but his wife has adjusted his Lasix upwards. Is no fever or chest  pain.  In the office today his pulse oximetry was 83% on 2 L nasal cannula which is far below baseline. Patient is wife asked them anxious about his health situation and is being admitted directly from the office to the hospital.   PAST MEDICAL HISTORY :  Past Medical History  Diagnosis Date  . GERD (gastroesophageal reflux disease)   . COPD (chronic obstructive pulmonary disease)   . Myocardial infarction 05/02/12  . Chronic systolic heart failure   . Essential hypertension, benign   . Cardiomyopathy     LVEF 40-45%  . Right ventricular mural thrombus     On Coumadin  . Cor pulmonale     Severe RV dysfunction  . Coronary atherosclerosis of native coronary artery     DES to OM [facility]  . Atrial fibrillation    Past Surgical History  Procedure Laterality Date  . 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 Coronary angioplasty with stent placement     Prior to Admission medications   Medication Sig Start Date End Date Taking? Authorizing Provider  albuterol (PROVENTIL HFA;VENTOLIN HFA) 108 (90 BASE) MCG/ACT inhaler Inhale 2 puffs into the lungs every 6 (six) hours as needed for wheezing.  Historical Provider, MD  carvedilol (COREG) 6.25 MG tablet Take 1 tablet (6.25 mg total) by mouth 2 (two) times daily. 10/18/12   Jodelle Gross, NP  cloNIDine (CATAPRES) 0.1 MG tablet Take 1 tablet (0.1 mg total) by mouth 3 (three) times daily. 11/01/12   Isabella Stalling, MD  digoxin (LANOXIN) 0.125 MG tablet Take 1 tablet (0.125 mg total) by mouth daily. 02/06/13   Jonelle Sidle, MD  Fluticasone-Salmeterol (ADVAIR) 250-50 MCG/DOSE AEPB Inhale 1 puff into the lungs every 12 (twelve) hours.    Historical Provider, MD  furosemide (LASIX) 40 MG tablet Take 40 mg by mouth daily. Can take 80mg  x 3 days  if increased swelling 02/12/13   Jonelle Sidle, MD  ipratropium-albuterol (DUONEB) 0.5-2.5 (3) MG/3ML SOLN Take 3 mLs by nebulization 3 (three) times daily.   05/10/12   Leslye Peer, MD  losartan (COZAAR) 100 MG tablet 1 tablet daily. 02/03/13   Historical Provider, MD  omeprazole (PRILOSEC) 20 MG capsule Take 20 mg by mouth 2 (two) times daily.     Historical Provider, MD  potassium chloride (K-DUR) 10 MEQ tablet Take 20 mEq by mouth at bedtime. 10/18/12   Jodelle Gross, NP  prasugrel (EFFIENT) 10 MG TABS Take 10 mg by mouth daily.    Historical Provider, MD  ramipril (ALTACE) 5 MG capsule Take 1 capsule (5 mg total) by mouth daily. 12/26/12   Isabella Stalling, MD  rosuvastatin (CRESTOR) 10 MG tablet Take 1 tablet (10 mg total) by mouth daily at 6 PM. 12/26/12   Isabella Stalling, MD  warfarin (COUMADIN) 2.5 MG tablet Take 3.75 mg by mouth daily.     Historical Provider, MD   Allergies  Allergen Reactions  . Lipitor [Atorvastatin] Other (See Comments)    Muscle spasms     FAMILY HISTORY:  No family history on file. SOCIAL HISTORY:  reports that he quit smoking about 18 years ago. His smoking use included Cigarettes. He has a 50 pack-year smoking history. He has never used smokeless tobacco. He reports that he does not drink alcohol or use illicit drugs.  REVIEW OF SYSTEMS:  11 point ROS negative other than in HPI    VITAL SIGNS: Temp:  [98 F (36.7 C)] 98 F (36.7 C) (11/13 1202) Pulse Rate:  [81] 81 (11/13 1202) BP: (102)/(64) 102/64 mmHg (11/13 1202) SpO2:  [83 %-91 %] 91 % (11/13 1210) FiO2 (%):  [28 %] 28 % (11/13 1210) Weight:  [166 lb 12.8 oz (75.66 kg)] 166 lb 12.8 oz (75.66 kg) (11/13 1202) HEMODYNAMICS:   VENTILATOR SETTINGS: Vent Mode:  [-]  FiO2 (%):  [28 %] 28 % INTAKE / OUTPUT: Intake/Output   None     PHYSICAL EXAMINATION: General:  Chronically unwell looking man sitting in the wheelchair Neuro:  Alert and oriented x3, speech is normal, moves all 4 extremities. HEENT:  Nasal cannula oxygen on. Neck is supple. No neck nodes. Cardiovascular:  Normal heart rate in the office with normal heart sounds. No  murmurs Lungs:  Clear to auscultation bilaterally. Overall air entry is diminished. No wheeze Abdomen:  Soft, nontender no organomegaly no normal bowel sounds Musculoskeletal:  Sitting in wheel chair do to respiratory issues Skin:  Possibly intact  LABS:  CBC No results found for this basename: WBC, HGB, HCT, PLT,  in the last 168 hours Coag's  Recent Labs Lab 02/12/13 1109  INR 2.1   BMET No results found for this basename: NA, K,  CL, CO2, BUN, CREATININE, GLUCOSE,  in the last 168 hours Electrolytes No results found for this basename: CALCIUM, MG, PHOS,  in the last 168 hours Sepsis Markers No results found for this basename: LATICACIDVEN, PROCALCITON, O2SATVEN,  in the last 168 hours ABG No results found for this basename: PHART, PCO2ART, PO2ART,  in the last 168 hours Liver Enzymes No results found for this basename: AST, ALT, ALKPHOS, BILITOT, ALBUMIN,  in the last 168 hours Cardiac Enzymes No results found for this basename: TROPONINI, PROBNP,  in the last 168 hours Glucose No results found for this basename: GLUCAP,  in the last 168 hours  Imaging No results found.   CXR: Pending  ASSESSMENT / PLAN:  PULMONARY A: Clinically appears to have a COPD exacerbation. Baseline Gold stage IV COPD. Transplant training in process at [facility] P:   Oxygen Nebulizers Steroids Antibiotics Check ABG Check chest x-ray Need to check with cardiology if he needs to be on carvedilol due to COPD  CARDIOVASCULAR A: History of pedal fibrillation/atrial tachycardia. Recent digoxin one week ago History of chronic systolic heart failure P:  Recheck EKG Cycle cardiac enzymes Check BNP Telemetry admission Check digoxin level Cardiology consultation [Dr. Simona Huh patient] Hold home dig and ace inhibitors and coreg but continue ARB, lasix, clonidine  RENAL A:  History of mild chronic renal insufficiency P:   Check renal function  GASTROINTESTINAL A:  Reportedly  no GI issues P:   Protonix   HEMATOLOGIC A:  Anemia noted November 2014 On Coumadin today to fibrillation possibly P:  Recheck CBC check INR  INFECTIOUS A:  Rule out bacterial process. At risk for multidrug-resistant, MRSA and hospital-acquired pneumonia P:   Check chest x-ray, blood cultures, urine culture, sepsis biomarkers Broad-spectrum empiric antibiotics   ENDOCRINE A:  No history of diabetes   P:   Sliding scale insulin  NEUROLOGIC A:  Intact P:   Monitor  TODAY'S SUMMARY: Admit her still on telemetry because there no beds at [facility]. Wife and he were updated. PResumed full code.   Dr. Kalman Shan, M.D., [facility].C.P Pulmonary and Critical Care Medicine Staff Physician [facility] [place] [facility] Pager: [phone], If no answer or between  15:00h - 7:00h: call [phone]  02/13/2013 12:35 PM

## 2013-02-13 NOTE — Consult Note (Signed)
CARDIOLOGY CONSULT NOTE   Patient ID: Danny [name] MRN: [id number] DOB/AGE: 55-Mar-1957 55 y.o.  Admit date: 02/13/2013  Primary Physician   DONDIEGO,RICHARD Judie Petit, MD; Dr Delton Coombes with CCM Primary Cardiologist   SM Reason for Consultation   Atrial fib, recently placed on dig, CHF  QIO:NGEXBM T Danny [name] is a 57 y.o. male with a history of CAD and atrial fibrillation.    He was started on digoxin recently after a Holter monitor showed atrial ectopy, possible atrial fib/flutter. Digoxin added on 11/5. Pt was having epistaxis as well, on coumadin for RV thrombus even before arrhythmia documented.   Pt began feeling worse, HR 40s at times and 120s at times. Called about this on 11/10, no change in plan. SOB worsened and oxygen requirements went up, now on 4 lpm. Called about this on 11/11. Weight only up 1 lb. Developed yellow mucus w/ BRB, increased SOB and wheezing. Lasix called in 11/12 or 13 but not started yet.   Scheduled with Dr. Conni Elliot 11/13, seen and admitted for COPD exacerbation. Digoxin level 0.7. Dig, ACE, BB held. On ARB, Lasix, clonidine. Cardiac enzymes negative for MI. His heart rate is now consistently elevated, with interpolated PVCs > 15/min, underlying sinus tachycardia.   Past Medical History  Diagnosis Date  . GERD (gastroesophageal reflux disease)   . COPD (chronic obstructive pulmonary disease)   . Myocardial infarction 05/02/12  . Chronic systolic heart failure   . Essential hypertension, benign   . Cardiomyopathy     LVEF 40-45%  . Right ventricular mural thrombus     On Coumadin  . Cor pulmonale     Severe RV dysfunction  . Coronary atherosclerosis of native coronary artery     DES to OM [facility]  . Atrial fibrillation   . Spontaneous pneumothorax 2002    bilateral     Past Surgical History  Procedure Laterality Date  . 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 Coronary angioplasty with stent placement  04/2012   in [place], [place]    Allergies  Allergen Reactions  . Lipitor [Atorvastatin] Other (See Comments)    Muscle spasms     I have reviewed the patient's current medications . albuterol  2.5 mg Nebulization Q4H   And  . ipratropium  0.5 mg Nebulization Q4H  . cloNIDine  0.1 mg Oral TID  . furosemide  40 mg Oral Daily  . losartan  100 mg Oral Daily  . methylPREDNISolone (SOLU-MEDROL) injection  80 mg Intravenous Q8H  . pantoprazole  40 mg Oral Q1200  . piperacillin-tazobactam (ZOSYN)  IV  3.375 g Intravenous Once  . [START ON 02/14/2013] piperacillin-tazobactam (ZOSYN)  IV  3.375 g Intravenous Q8H  . prasugrel  10 mg Oral Daily  . rosuvastatin  10 mg Oral q1800  . [START ON 02/14/2013] vancomycin  750 mg Intravenous Q12H  . warfarin  3.75 mg Oral ONCE-1800  . Warfarin - Pharmacist Dosing Inpatient   Does not apply q1800     sodium chloride  Prior to Admission medications   Medication Sig Start Date End Date Taking? Authorizing Provider  albuterol (PROVENTIL HFA;VENTOLIN HFA) 108 (90 BASE) MCG/ACT inhaler Inhale 2 puffs into the lungs every 6 (six) hours as needed for wheezing.    Historical Provider, MD  carvedilol (COREG) 6.25 MG tablet Take 1 tablet (6.25 mg total) by mouth 2 (two) times daily. 10/18/12   Jodelle Gross, NP  cloNIDine (CATAPRES)  0.1 MG tablet Take 1 tablet (0.1 mg total) by mouth 3 (three) times daily. 11/01/12   Isabella Stalling, MD  digoxin (LANOXIN) 0.125 MG tablet Take 1 tablet (0.125 mg total) by mouth daily. 02/06/13   Jonelle Sidle, MD  Fluticasone-Salmeterol (ADVAIR) 250-50 MCG/DOSE AEPB Inhale 1 puff into the lungs every 12 (twelve) hours.    Historical Provider, MD  furosemide (LASIX) 40 MG tablet Take 40 mg by mouth daily. Can take 80mg  x 3 days  if increased swelling 02/12/13   Jonelle Sidle, MD  ipratropium-albuterol (DUONEB) 0.5-2.5 (3) MG/3ML SOLN Take 3 mLs by nebulization 3 (three) times daily.  05/10/12   Leslye Peer, MD  losartan (COZAAR)  100 MG tablet 1 tablet daily. 02/03/13   Historical Provider, MD  omeprazole (PRILOSEC) 20 MG capsule Take 20 mg by mouth 2 (two) times daily.     Historical Provider, MD  potassium chloride (K-DUR) 10 MEQ tablet Take 20 mEq by mouth at bedtime. 10/18/12   Jodelle Gross, NP  prasugrel (EFFIENT) 10 MG TABS Take 10 mg by mouth daily.    Historical Provider, MD  ramipril (ALTACE) 5 MG capsule Take 1 capsule (5 mg total) by mouth daily. 12/26/12   Isabella Stalling, MD  rosuvastatin (CRESTOR) 10 MG tablet Take 1 tablet (10 mg total) by mouth daily at 6 PM. 12/26/12   Isabella Stalling, MD  warfarin (COUMADIN) 2.5 MG tablet Take 3.75 mg by mouth daily.     Historical Provider, MD     History   Social History  . Marital Status: Married    Spouse Name: N/A    Number of Children: N/A  . Years of Education: N/A   Occupational History  . Tour bus driver    Social History Main Topics  . Smoking status: Former Smoker -- 2.00 packs/day for 25 years    Types: Cigarettes    Quit date: 04/03/1994  . Smokeless tobacco: Never Used  . Alcohol Use: No  . Drug Use: No  . Sexual Activity: No   Other Topics Concern  . Not on file   Social History Narrative   Lives with wife in [place], [place].    Family Status  Relation Status Death Age  . Mother Deceased   . Father Deceased    History reviewed. No pertinent family history.   ROS:  Full 14 point review of systems complete and found to be negative unless listed above.  Physical Exam: Blood pressure 120/67, pulse 40, temperature 97.1 F (36.2 C), temperature source Oral, height 5\' 7"  (1.702 m), weight 161 lb (73.029 kg), SpO2 95.00%.  General: Well developed, well nourished, male in no acute distress Head: Eyes PERRLA, No xanthomas.   Normocephalic and atraumatic, oropharynx without edema or exudate. Dentition:  Lungs:  Heart: HRRR S1 S2, no rub/gallop, Heart irregular rate and rhythm with S1, S2  murmur. pulses are 2+ extrem.   Neck: No  carotid bruits. No lymphadenopathy.  JVD. Abdomen: Bowel sounds present, abdomen soft and non-tender without masses or hernias noted. Msk:  No spine or cva tenderness. No weakness, no joint deformities or effusions. Extremities: No clubbing or cyanosis.  edema.  Neuro: Alert and oriented X 3. No focal deficits noted. Psych:  Good affect, responds appropriately Skin: No rashes or lesions noted.  Labs:   Lab Results  Component Value Date   WBC 9.0 02/13/2013   HGB 12.7* 02/13/2013   HCT 38.3* 02/13/2013   MCV 99.5  02/13/2013   PLT 261 02/13/2013    Recent Labs  02/13/13 1334  INR 1.84*     Recent Labs Lab 02/13/13 1334  NA 138  K 3.8  CL 98  CO2 32  BUN 16  CREATININE 1.42*  CALCIUM 9.9  PROT 7.2  BILITOT 0.7  ALKPHOS 67  ALT 14  AST 15  GLUCOSE 103*  ALBUMIN 3.6   Magnesium  Date Value Range Status  02/13/2013 2.0  1.5 - 2.5 mg/dL Final    Recent Labs  16/10/96 1334  TROPONINI <0.30   Pro B Natriuretic peptide (BNP)  Date/Time Value Range Status  02/13/2013  1:32 PM 2361.0* 0 - 125 pg/mL Final  10/23/2012 10:55 AM 2088.0* 0 - 125 pg/mL Final   Lipase  Date/Time Value Range Status  02/13/2013  1:34 PM 37  11 - 59 U/L Final     Amylase  Date/Time Value Range Status  02/13/2013  1:34 PM 124* 0 - 105 U/L Final   Lab Results  Component Value Date   DIGOXIN 0.7* 02/13/2013   Echo: 01/27/2013 Study Conclusions - Procedure narrative: Transthoracic echocardiography. Image quality was suboptimal. The study was technically difficult, as a result of poor sound wave transmission. - Left ventricle: Wall thickness was increased in a pattern of mild LVH. Systolic function was mildly to moderately reduced. The estimated ejection fraction was in the range of 40% to 45%. Mild to moderate global hypokinesis is seen, with dysynchronous contraction. There was an increased relative contribution of atrial contraction to ventricular filling. Doppler parameters  are consistent with abnormal left ventricular relaxation (grade 1 diastolic dysfunction). - Ventricular septum: The contour showed diastolic flattening and systolic flattening, indicative of combined volume and pressure overload. - Aortic valve: Trileaflet; mildly thickened leaflets. There was no stenosis. - Right ventricle: A homogeneous mass is seen in the right ventricular apex, with a high suspicion for thrombus. This was noted on the previous study from 10-18-2012, but was not as well visualized on this study. The cavity size was severely dilated. Systolic function was severely reduced. - Right atrium: The atrium was moderately to severely dilated. - Pulmonary arteries: The TRDoppler velocity profileis incomplete to make an accurate assessment of PA systolic pressure. - Inferior vena cava: The vessel was dilated; the respirophasic diameter changes were blunted (< 50%); findings are consistent with elevated central venous pressure.   ECG: 13-Feb-2013 15:02:21  Sinus rhythm with 1st degree A-V block with frequent Premature ventricular complexes Left atrial enlargement Left posterior fascicular block Right ventricular hypertrophy Vent. rate 125 BPM PR interval 234 ms QRS duration 106 ms QT/QTc 306/441 ms P-R-T axes 42 125 -10  Radiology:  Dg Chest Port 1 View 02/13/2013   CLINICAL DATA:  Shortness of breath  EXAM: PORTABLE CHEST - 1 VIEW  COMPARISON:  01/21/2013  FINDINGS: Cardiac shadow is stable. Chronic changes are noted in the lungs bilaterally. Chronic changes in the right rib cage are noted as well. The changes in the right lateral lung base have improved in the interval. No new focal infiltrate is seen. Scarring is noted in the left base.  IMPRESSION: Improved aeration in the right lung base. Chronic changes are identified.   Electronically Signed   By: Alcide Clever M.D.   On: 02/13/2013 13:32    ASSESSMENT AND PLAN:   The patient was seen today by Dr. Jens Som, the  patient evaluated and the data reviewed.  Active Problems:   COPD exacerbation   Signed: Theodore Demark, PA-C  02/13/2013 3:56 PM Beeper [phone]  Co-Sign MD As above, patient seen and examined. Briefly he is a complicated 57 year old male with past medical history of coronary artery disease with prior drug-eluting stent to his obtuse marginal, ischemic cardiomyopathy with an ejection fraction of 40-45%, severe COPD with prior bullectomies, cor pulmonale, and right ventricular new thrombus on Coumadin who I am asked to evaluate for dyspnea. Last echocardiogram in October of 2014 and showed an ejection fraction of 40-45%, thrombus and right ventricle, severe right ventricular enlargement and severe RV dysfunction. Moderate to severe left atrial enlargement. The patient has chronic dyspnea on exertion but over the past month he has had progressive increased dyspnea on exertion. He also has orthopnea. He had pedal edema and his Lasix was increased with improvement. He has had problems with recurrent palpitations and states his heart rate can increase to 160 and at other times is 40. He denies fevers or chills but has had a mildly productive cough and blood-streaked sputum. He occasionally has chest pain when he has palpitations but does not have exertional chest pain. Patient was seen by pulmonary today and noted to be hypoxic and was admitted. Cardiology is asked to evaluate. Laboratories show a pro BNP of 2361. Creatinine is 1.42. Troponin is normal. White blood cell count 9.0. And electrocardiogram shows sinus rhythm with frequent PACs and PVCs.first degree AV block. Cannot rule out prior inferior infarct. Left posterior fascicular block. Nonspecific ST changes. Patient is admitted with possible COPD flare. Agree with therapy including bronchodilators and antibiotics. His biggest issue appears to be COPD and pulmonary hypertension. I agree that he needs an evaluation at [facility] for consideration of  transplant. I would continue present dose of diuretics as his volume status appears to be reasonable. Continue Coumadin for RV thrombus. Continue effient for previous stent. Recent Holter monitor showed possible short runs of atrial fibrillation. Follow telemetry closely. Would continue Coreg and digoxin at this point. He may require outpatient CardioNet to further assess. And I agree with discontinuing ACE inhibitor as he is on an ARB. Olga Millers 5:27 PM

## 2013-02-13 NOTE — Progress Notes (Signed)
  Subjective:    Patient ID: Danny [name], male    DOB: 05-10-1955, 57 y.o.   MRN: [id number]  HPI  admitted  Review of Systems  Constitutional: Negative for fever and unexpected weight change.  HENT: Negative for congestion, dental problem, ear pain, nosebleeds, postnasal drip, rhinorrhea, sinus pressure, sneezing, sore throat and trouble swallowing.   Eyes: Negative for redness and itching.  Respiratory: Positive for cough, chest tightness and shortness of breath. Negative for wheezing.   Cardiovascular: Negative for palpitations and leg swelling.  Gastrointestinal: Negative for nausea and vomiting.  Genitourinary: Negative for dysuria.  Musculoskeletal: Negative for joint swelling.  Skin: Negative for rash.  Neurological: Negative for headaches.  Hematological: Does not bruise/bleed easily.  Psychiatric/Behavioral: Negative for dysphoric mood. The patient is not nervous/anxious.        Objective:   Physical Exam        Assessment & Plan:

## 2013-02-14 DIAGNOSIS — I272 Pulmonary hypertension, unspecified: Secondary | ICD-10-CM

## 2013-02-14 DIAGNOSIS — I4891 Unspecified atrial fibrillation: Secondary | ICD-10-CM

## 2013-02-14 DIAGNOSIS — I2789 Other specified pulmonary heart diseases: Secondary | ICD-10-CM

## 2013-02-14 LAB — URINE CULTURE

## 2013-02-14 LAB — TROPONIN I: Troponin I: <0.3 ng/mL (ref <0.30)

## 2013-02-14 LAB — LEGIONELLA ANTIGEN, URINE: Legionella Antigen, Urine: NEGATIVE

## 2013-02-14 MED ORDER — METHYLPREDNISOLONE SODIUM SUCC 40 MG IJ SOLR
40.0000 mg | Freq: Two times a day (BID) | INTRAMUSCULAR | Status: DC
  Administered 2013-02-14 – 2013-02-16 (×4): 40 mg via INTRAVENOUS
  Filled 2013-02-14 (×6): qty 1

## 2013-02-14 MED ORDER — ARFORMOTEROL TARTRATE 15 MCG/2ML IN NEBU
15.0000 ug | INHALATION_SOLUTION | Freq: Two times a day (BID) | RESPIRATORY_TRACT | Status: DC
  Administered 2013-02-14 – 2013-02-18 (×9): 15 ug via RESPIRATORY_TRACT
  Filled 2013-02-14 (×14): qty 2

## 2013-02-14 MED ORDER — DOCUSATE SODIUM 100 MG PO CAPS
100.0000 mg | ORAL_CAPSULE | Freq: Two times a day (BID) | ORAL | Status: DC
  Administered 2013-02-14 – 2013-02-18 (×8): 100 mg via ORAL
  Filled 2013-02-14 (×10): qty 1

## 2013-02-14 MED ORDER — POLYETHYLENE GLYCOL 3350 17 G PO PACK
17.0000 g | PACK | Freq: Every day | ORAL | Status: DC | PRN
  Filled 2013-02-14: qty 1

## 2013-02-14 MED ORDER — WARFARIN SODIUM 4 MG PO TABS
4.0000 mg | ORAL_TABLET | Freq: Once | ORAL | Status: AC
  Administered 2013-02-14: 4 mg via ORAL
  Filled 2013-02-14: qty 1

## 2013-02-14 MED ORDER — IPRATROPIUM BROMIDE 0.02 % IN SOLN
0.5000 mg | RESPIRATORY_TRACT | Status: DC | PRN
  Administered 2013-02-14 – 2013-02-17 (×2): 0.5 mg via RESPIRATORY_TRACT
  Filled 2013-02-14 (×2): qty 2.5

## 2013-02-14 MED ORDER — ALBUTEROL SULFATE (5 MG/ML) 0.5% IN NEBU
2.5000 mg | INHALATION_SOLUTION | RESPIRATORY_TRACT | Status: DC | PRN
  Administered 2013-02-14 – 2013-02-17 (×2): 2.5 mg via RESPIRATORY_TRACT
  Filled 2013-02-14 (×2): qty 0.5

## 2013-02-14 MED ORDER — LEVOFLOXACIN IN D5W 500 MG/100ML IV SOLN
500.0000 mg | INTRAVENOUS | Status: DC
  Administered 2013-02-14 – 2013-02-16 (×3): 500 mg via INTRAVENOUS
  Filled 2013-02-14 (×5): qty 100

## 2013-02-14 MED ORDER — BUDESONIDE 0.25 MG/2ML IN SUSP
0.2500 mg | Freq: Two times a day (BID) | RESPIRATORY_TRACT | Status: DC
  Administered 2013-02-14 – 2013-02-18 (×9): 0.25 mg via RESPIRATORY_TRACT
  Filled 2013-02-14 (×16): qty 2

## 2013-02-14 NOTE — Progress Notes (Signed)
Nutrition Brief Note  Patient identified on the Malnutrition Screening Tool (MST) Report  Wt Readings from Last 15 Encounters:  02/14/13 164 lb 7.4 oz (74.6 kg)  02/13/13 166 lb 12.8 oz (75.66 kg)  02/05/13 166 lb (75.297 kg)  02/04/13 168 lb 6.4 oz (76.386 kg)  01/23/13 166 lb (75.297 kg)  01/21/13 166 lb (75.297 kg)  12/23/12 163 lb 12.8 oz (74.3 kg)  11/28/12 166 lb 8 oz (75.524 kg)  10/31/12 170 lb 10.2 oz (77.4 kg)  10/18/12 163 lb (73.936 kg)  10/18/12 163 lb 4 oz (74.05 kg)  10/14/12 163 lb 4 oz (74.05 kg)  09/26/12 168 lb 3.2 oz (76.295 kg)  09/18/12 170 lb (77.111 kg)  06/25/12 174 lb 3.2 oz (79.017 kg)    Body mass index is 25.75 kg/(m^2). Patient meets criteria for Overweight based on current BMI. Pt reports that he usually weighs around 162 to 168 lbs; he relates recent weight loss to removal of fluid that accumulated around his heart. He states his appetite is good and he is eating well. Pt reports following a low sodium diet at home and avoids fried foods; he denies any need for additional diet education.  Current diet order is Heart Healthy, patient is consuming approximately 75% of meals at this time. Encouraged pt to eat 3 balanced meals daily; encouraged pt to incorporate fruits, vegetables and protein-rich foods at each meal. Labs and medications reviewed.   No nutrition interventions warranted at this time. If nutrition issues arise, please consult RD.   Ian Malkin RD, LDN Inpatient Clinical Dietitian Pager: [phone] After Hours Pager: [phone]

## 2013-02-14 NOTE — Progress Notes (Signed)
Spoke with pt's wife concerning discharge needs for pt. She was not sure of any needs at this time. She want to see what MD recommend.

## 2013-02-14 NOTE — Progress Notes (Signed)
    Subjective:  Dyspnea improving; no chest pain.   Objective:  Filed Vitals:   02/13/13 2313 02/13/13 2315 02/14/13 0437 02/14/13 0500  BP:   119/80   Pulse:  86 76   Temp:   97.9 F (36.6 C)   TempSrc:   Oral   Resp:  20 18   Height:      Weight:    164 lb 7.4 oz (74.6 kg)  SpO2: 94% 96% 100%     Intake/Output from previous day:  Intake/Output Summary (Last 24 hours) at 02/14/13 0723 Last data filed at 02/14/13 0507  Gross per 24 hour  Intake    890 ml  Output    750 ml  Net    140 ml    Physical Exam: Physical exam: Well-developed well-nourished in no acute distress.  Skin is warm and dry.  HEENT is normal.  Neck is supple.  Chest is clear to auscultation with normal expansion.  Cardiovascular exam is regular rate and rhythm.  Abdominal exam nontender or distended. No masses palpated. Extremities show no edema. neuro grossly intact    Lab Results: Basic Metabolic Panel:  Recent Labs  04/54/09 1334  NA 138  K 3.8  CL 98  CO2 32  GLUCOSE 103*  BUN 16  CREATININE 1.42*  CALCIUM 9.9  MG 2.0  PHOS 3.2   CBC:  Recent Labs  02/13/13 1334  WBC 9.0  NEUTROABS 5.4  HGB 12.7*  HCT 38.3*  MCV 99.5  PLT 261   Cardiac Enzymes:  Recent Labs  02/13/13 1334 02/13/13 1829 02/14/13 0035  TROPONINI <0.30 <0.30 <0.30     Assessment/Plan:  1 COPD-continue bronchodilators and antibiotics. 2 pulmonary hypertension-predominant issue; severe with cor pulmonale; patient appears to be euvolemic on examination. Continue present dose of Lasix. Continue digoxin and Coumadin. Agree with [facility] evaluation at [facility]. 3 CAD-continue effient for DES 2/14; continue statin.  4 possible atrial fibrillation noted on recent Holter monitor-telemetry has been reviewed. He remains in sinus with frequent PACs and PVCs. Continue Coreg, digoxin and Coumadin. There has been no bradycardia noted. If he has more frequent palpitations after discharge will  consider CardioNet. Options for antiarrhythmic therapy would be limited. He would not be a candidate for flecainide given his history of coronary artery disease. Amiodarone would most likely need to be avoided given severity of pulmonary disease. Renal insufficiency would most likely preclude tikosyn. 5 hypertension-continue present medications. 6 right ventricular thrombus-continue Coumadin.   Olga Millers 02/14/2013, 7:23 AM

## 2013-02-14 NOTE — Progress Notes (Signed)
Placed patient on BIPAP for the night with oxygen set at 2.5lpm. Sp02 at this time 92%. Will continue to monitor patient

## 2013-02-14 NOTE — Progress Notes (Signed)
PULMONARY  / CRITICAL CARE MEDICINE  Name: Danny [name] MRN: [id number] DOB: [birth year]    ADMISSION DATE:  02/13/2013  CHIEF COMPLAINT: Hemoptysis  BRIEF PATIENT DESCRIPTION:  57 yo male former smoker admitted from pulmonary office with fatigue, cough, dyspnea, hypoxia, edema and increased sputum with hemoptysis.  Recent started on digoxin for A fib as outpt.  Followed by Dr. Delton Coombes in pulmonary office for GOLD 4 COPD s/p bullectomy, and cor pulmonale.  SIGNIFICANT EVENTS: 11/13 Admit from pulmonary office, cardiology consulted  STUDIES:  04/24/12 Spirometry >> FEV1 0.78 (26%), FEV1% 45 01/27/13 Echo >> mild LVH, EF 40 to 45%, grade 1 diastolic dysfx, thrombus Rt ventricular apex, mod/severe RA dilation  LINES / TUBES: PIV  CULTURES: Blood 11/13 >> Urine 11/13 >> Pneumococcal Ag 11/13 >> negative Legionella Ag 11/13 >>   ANTIBIOTICS: Vancomycin 11/3 >> 11/14 Zosyn 11/13 >> 11/14 Levaquin 11/14  SUBJECTIVE:  Breathing improved.  C/o constipation.  Not as much sputum.  No further blood in sputum.  Had several episodes of epistaxis over past two months.  Had chills and then sweats last night.  VITAL SIGNS: Temp:  [97.1 F (36.2 C)-98.6 F (37 C)] 97.9 F (36.6 C) (11/14 0437) Pulse Rate:  [40-86] 78 (11/14 1020) Resp:  [18-20] 18 (11/14 0437) BP: (102-120)/(63-88) 120/72 mmHg (11/14 1020) SpO2:  [83 %-100 %] 95 % (11/14 0842) FiO2 (%):  [28 %] 28 % (11/13 1210) Weight:  [161 lb (73.029 kg)-166 lb 12.8 oz (75.66 kg)] 164 lb 7.4 oz (74.6 kg) (11/14 0500)  PHYSICAL EXAMINATION: General: No distress Neuro: Normal strength HEENT: No sinus tenderness Cardiovascular:  Regular, no murmur Lungs: decreased breath sounds, no wheeze Abdomen: soft, non tender Musculoskeletal: ankle edema Skin: no rashes  Labs: CBC Recent Labs     02/13/13  1334  WBC  9.0  HGB  12.7*  HCT  38.3*  PLT  261    Coag's Recent Labs     02/12/13  1109  02/13/13  1334  02/14/13  0035   APTT   --   31   --   INR  2.1  1.84*  1.89*    BMET Recent Labs     02/13/13  1334  NA  138  K  3.8  CL  98  CO2  32  BUN  16  CREATININE  1.42*  GLUCOSE  103*    Electrolytes Recent Labs     02/13/13  1334  CALCIUM  9.9  MG  2.0  PHOS  3.2    Sepsis Markers Recent Labs     02/13/13  1333  PROCALCITON  <0.10    ABG Recent Labs     02/13/13  1455  PHART  7.443  PCO2ART  45.0  PO2ART  54.8*    Liver Enzymes Recent Labs     02/13/13  1334  AST  15  ALT  14  ALKPHOS  67  BILITOT  0.7  ALBUMIN  3.6    Cardiac Enzymes Recent Labs     02/13/13  1332  02/13/13  1334  02/13/13  1829  02/14/13  0035  TROPONINI   --   <0.30  <0.30  <0.30  PROBNP  2361.0*   --    --    --     Glucose No results found for this basename: GLUCAP,  in the last 72 hours  Imaging Dg Chest Port 1 View  02/13/2013   CLINICAL DATA:  Shortness of breath  EXAM: PORTABLE CHEST - 1 VIEW  COMPARISON:  01/21/2013  FINDINGS: Cardiac shadow is stable. Chronic changes are noted in the lungs bilaterally. Chronic changes in the right rib cage are noted as well. The changes in the right lateral lung base have improved in the interval. No new focal infiltrate is seen. Scarring is noted in the left base.  IMPRESSION: Improved aeration in the right lung base. Chronic changes are identified.   Electronically Signed   By: Alcide Clever M.D.   On: 02/13/2013 13:32       ASSESSMENT / PLAN:  A: Acute on chronic respiratory failure with hypoxia/hypercapnia. Acute on chronic cor pulmonale. AECOPD with hx of GOLD 4 COPD and bullous emphysema >> being evaluated at [facility] for transplant. Hemoptysis >> very small amount. Secondary pulmonary hypertension. Lt upper lobe pulmonary nodule on CT chest 10/23/12. P: -wean off solumedrol as tolerated -change to brovana, pulmicort BID 11/14 -prn albuterol, atrovent -oxygen to keep SpO2 > 92% -f/u CXR 11/15 -no evidence for PNA on CXR >> change Abx  to levaquin 11/14 -continue BiPAP qhs  A: Intermittent tachycardia with A fib noted on outpt holter monitoring. Chronic systolic heart failure. Hx of HTN, RV thrombus, CAD s/p stenting. P: -okay to continue coumadin per pharmacy -coreg, catapres, digoxin, cozaar, effient, crestor per cardiology -monitor on tele -continue lasix  A: Stage 2 CKD. P: -monitor renal fx, urine outpt, electrolytes  A: Constipation. P: -adjust bowel regimen  Coralyn Helling, MD [facility] Pulmonary/Critical Care 02/14/2013, 11:00 AM Pager:  [phone] After 3pm call: [phone]

## 2013-02-14 NOTE — Progress Notes (Signed)
CSW assisted patient/family in completing Advance Directives. The patient designated his wife as his primary healthcare agent.  Patient also completed healthcare living will.  The patient designates he does not want life prolonging measures in the situations indicated in his living will.  Clinical Social Worker notarized documents and made copies for patient/family. Clinical Social Worker placed copy on shadow chart to be scanned into patient's chart.   Clinical Social Worker encouraged patient/family to contact with any additional questions or concerns.  Unice Bailey, LCSW [facility] Clinical Social Worker cell #: [phone]

## 2013-02-14 NOTE — Progress Notes (Signed)
Copd gold program explained. Give COPD GOLD card # [phone]

## 2013-02-14 NOTE — Progress Notes (Signed)
ANTICOAGULATION CONSULT NOTE - Follow Up Consult  Pharmacy Consult for Warfarin Indication: RV mural thrombus  Allergies  Allergen Reactions  . Lipitor [Atorvastatin] Other (See Comments)    Muscle spasms     Patient Measurements: Height: 5\' 7"  (170.2 cm) Weight: 164 lb 7.4 oz (74.6 kg) IBW/kg (Calculated) : 66.1  Vital Signs: Temp: 97.9 F (36.6 C) (11/14 0437) Temp src: Oral (11/14 0437) BP: 120/72 mmHg (11/14 1020) Pulse Rate: 78 (11/14 1020)  Labs:  Recent Labs  02/12/13 1109 02/13/13 1334 02/13/13 1829 02/14/13 0035  HGB  --  12.7*  --   --   HCT  --  38.3*  --   --   PLT  --  261  --   --   APTT  --  31  --   --   LABPROT  --  20.7*  --  21.1*  INR 2.1 1.84*  --  1.89*  CREATININE  --  1.42*  --   --   TROPONINI  --  <0.30 <0.30 <0.30    Estimated Creatinine Clearance: 55 ml/min (by C-G formula based on Cr of 1.42).   Medications:  Scheduled:  . albuterol  2.5 mg Nebulization Q4H   And  . ipratropium  0.5 mg Nebulization Q4H  . carvedilol  6.25 mg Oral BID WC  . cloNIDine  0.1 mg Oral TID  . digoxin  0.125 mg Oral Daily  . furosemide  40 mg Oral Daily  . losartan  100 mg Oral Daily  . methylPREDNISolone (SOLU-MEDROL) injection  80 mg Intravenous Q8H  . pantoprazole  40 mg Oral Q1200  . piperacillin-tazobactam (ZOSYN)  IV  3.375 g Intravenous Q8H  . prasugrel  10 mg Oral Daily  . rosuvastatin  10 mg Oral q1800  . vancomycin  750 mg Intravenous Q12H  . Warfarin - Pharmacist Dosing Inpatient   Does not apply q1800   Infusions:    Assessment: 57 yo male on chronic warfarin for history RV mural thrombus. Order to continue per Pharmacy protocol on admission.  Home dose is 3.75mg  daily  INR today slightly subtherapeutic 1.89  CBC ok, no bleeding/complications reported  Patient also takes prasugrel  - monitor closely for bleeding  Goal of Therapy:  INR 2-3   Plan:   Increase warfarin slightly to 4mg  today  Daily PT/INR  Loralee Pacas, PharmD, BCPS Pager: [phone] 02/14/2013,10:47 AM

## 2013-02-15 ENCOUNTER — Inpatient Hospital Stay (HOSPITAL_COMMUNITY): Payer: Managed Care, Other (non HMO)

## 2013-02-15 DIAGNOSIS — I10 Essential (primary) hypertension: Secondary | ICD-10-CM

## 2013-02-15 DIAGNOSIS — R000 Tachycardia, unspecified: Secondary | ICD-10-CM | POA: Diagnosis present

## 2013-02-15 DIAGNOSIS — I498 Other specified cardiac arrhythmias: Secondary | ICD-10-CM

## 2013-02-15 DIAGNOSIS — I2510 Atherosclerotic heart disease of native coronary artery without angina pectoris: Secondary | ICD-10-CM

## 2013-02-15 LAB — CBC
Hemoglobin: 11.4 g/dL — ABNORMAL LOW (ref 13.0–17.0)
MCHC: 32.7 g/dL (ref 30.0–36.0)
Platelets: 289 10*3/uL (ref 150–400)
RBC: 3.48 MIL/uL — ABNORMAL LOW (ref 4.22–5.81)
WBC: 21.1 10*3/uL — ABNORMAL HIGH (ref 4.0–10.5)

## 2013-02-15 LAB — BASIC METABOLIC PANEL
BUN: 24 mg/dL — ABNORMAL HIGH (ref 6–23)
GFR calc Af Amer: 57 mL/min — ABNORMAL LOW (ref >90)
GFR calc non Af Amer: 50 mL/min — ABNORMAL LOW (ref >90)
Potassium: 4.4 mEq/L (ref 3.5–5.1)
Sodium: 141 mEq/L (ref 135–145)

## 2013-02-15 LAB — PROTIME-INR: Prothrombin Time: 24.6 seconds — ABNORMAL HIGH (ref 11.6–15.2)

## 2013-02-15 MED ORDER — WARFARIN SODIUM 3 MG PO TABS
3.5000 mg | ORAL_TABLET | Freq: Once | ORAL | Status: AC
  Administered 2013-02-15: 3.5 mg via ORAL
  Filled 2013-02-15: qty 1

## 2013-02-15 MED ORDER — CARVEDILOL 12.5 MG PO TABS
12.5000 mg | ORAL_TABLET | Freq: Two times a day (BID) | ORAL | Status: DC
  Administered 2013-02-15: 12.5 mg via ORAL
  Filled 2013-02-15 (×4): qty 1

## 2013-02-15 NOTE — Progress Notes (Signed)
ANTICOAGULATION CONSULT NOTE - Follow Up Consult  Pharmacy Consult for Warfarin Indication: RV mural thrombus  Allergies  Allergen Reactions  . Lipitor [Atorvastatin] Other (See Comments)    Muscle spasms     Patient Measurements: Height: 5\' 7"  (170.2 cm) Weight: 164 lb 7.4 oz (74.6 kg) IBW/kg (Calculated) : 66.1  Vital Signs: Temp: 98.7 F (37.1 C) (11/15 0538) Temp src: Oral (11/15 0538) BP: 141/87 mmHg (11/15 0538) Pulse Rate: 89 (11/15 0538)  Labs:  Recent Labs  02/13/13 1334 02/13/13 1829 02/14/13 0035 02/15/13 0524  HGB 12.7*  --   --  11.4*  HCT 38.3*  --   --  34.9*  PLT 261  --   --  289  APTT 31  --   --   --   LABPROT 20.7*  --  21.1* 24.6*  INR 1.84*  --  1.89* 2.31*  CREATININE 1.42*  --   --  1.53*  TROPONINI <0.30 <0.30 <0.30  --     Estimated Creatinine Clearance: 51 ml/min (by C-G formula based on Cr of 1.53).   Medications:  Scheduled:  . arformoterol  15 mcg Nebulization BID  . budesonide (PULMICORT) nebulizer solution  0.25 mg Nebulization BID  . carvedilol  12.5 mg Oral BID WC  . cloNIDine  0.1 mg Oral TID  . digoxin  0.125 mg Oral Daily  . docusate sodium  100 mg Oral BID  . furosemide  40 mg Oral Daily  . levofloxacin (LEVAQUIN) IV  500 mg Intravenous Q24H  . losartan  100 mg Oral Daily  . methylPREDNISolone (SOLU-MEDROL) injection  40 mg Intravenous Q12H  . pantoprazole  40 mg Oral Q1200  . prasugrel  10 mg Oral Daily  . rosuvastatin  10 mg Oral q1800  . Warfarin - Pharmacist Dosing Inpatient   Does not apply q1800   Assessment: 57 yo male on chronic warfarin for history RV mural thrombus. Order to continue per Pharmacy protocol on admission.  Home dose is 3.75mg  daily  INR (2.31) is therapeutic  CBC: Hgb (11.4) decreased and Plt remain wnl.  No bleeding/complications reported  Patient also takes prasugrel  - monitor closely for bleeding  Noted drug-drug interaction with Levaquin (will prolong INR) started 11/14.  Goal  of Therapy:  INR 2-3   Plan:   Warfarin 3.5 mg today at 1800 x1  Daily PT/INR   Lynann Beaver PharmD, BCPS Pager [phone] 02/15/2013 10:10 AM

## 2013-02-15 NOTE — Progress Notes (Signed)
SUBJECTIVE:  Had an episode of SOB this am with drop in O2 sats which resolved quickly and now O2 sats 91%.    OBJECTIVE:   Vitals:   Filed Vitals:   02/14/13 2146 02/14/13 2335 02/14/13 2345 02/15/13 0538  BP: 139/87   141/87  Pulse: 92  92 89  Temp: 98.3 F (36.8 C)   98.7 F (37.1 C)  TempSrc: Oral   Oral  Resp: 18  19 18   Height:      Weight:      SpO2: 92% 92% 92% 98%   I&O's:   Intake/Output Summary (Last 24 hours) at 02/15/13 0830 Last data filed at 02/15/13 0703  Gross per 24 hour  Intake    720 ml  Output   1351 ml  Net   -631 ml   TELEMETRY: Reviewed telemetry pt in sinus tachycardia with frequent PAC's and PVC's:     PHYSICAL EXAM General: Well developed, well nourished, in no acute distress Head: Eyes PERRLA, No xanthomas.   Normal cephalic and atramatic  Lungs:   Clear bilaterally to auscultation and percussion. Heart:   HRRR S1 S2 Pulses are 2+ & equal. Abdomen: Bowel sounds are positive, abdomen soft and non-tender without masses  Extremities:   No clubbing, cyanosis or edema.  DP +1 Neuro: Alert and oriented X 3. Psych:  Good affect, responds appropriately   LABS: Basic Metabolic Panel:  Recent Labs  16/10/96 1334 02/15/13 0524  NA 138 141  K 3.8 4.4  CL 98 101  CO2 32 32  GLUCOSE 103* 186*  BUN 16 24*  CREATININE 1.42* 1.53*  CALCIUM 9.9 9.6  MG 2.0  --   PHOS 3.2  --    Liver Function Tests:  Recent Labs  02/13/13 1334  AST 15  ALT 14  ALKPHOS 67  BILITOT 0.7  PROT 7.2  ALBUMIN 3.6    Recent Labs  02/13/13 1334  LIPASE 37  AMYLASE 124*   CBC:  Recent Labs  02/13/13 1334 02/15/13 0524  WBC 9.0 21.1*  NEUTROABS 5.4  --   HGB 12.7* 11.4*  HCT 38.3* 34.9*  MCV 99.5 100.3*  PLT 261 289   Cardiac Enzymes:  Recent Labs  02/13/13 1334 02/13/13 1829 02/14/13 0035  TROPONINI <0.30 <0.30 <0.30   BNP: No components found with this basename: POCBNP,  D-Dimer: No results found for this basename: DDIMER,  in  the last 72 hours Hemoglobin A1C: No results found for this basename: HGBA1C,  in the last 72 hours Fasting Lipid Panel: No results found for this basename: CHOL, HDL, LDLCALC, TRIG, CHOLHDL, LDLDIRECT,  in the last 72 hours Thyroid Function Tests: No results found for this basename: TSH, T4TOTAL, FREET3, T3FREE, THYROIDAB,  in the last 72 hours Anemia Panel: No results found for this basename: VITAMINB12, FOLATE, FERRITIN, TIBC, IRON, RETICCTPCT,  in the last 72 hours Coag Panel:   Lab Results  Component Value Date   INR 2.31* 02/15/2013   INR 1.89* 02/14/2013   INR 1.84* 02/13/2013    RADIOLOGY: Dg Chest Port 1 View  02/15/2013   CLINICAL DATA:  Followup COPD.  Shortness of Breath.  EXAM: PORTABLE CHEST - 1 VIEW  COMPARISON:  02/13/2013  FINDINGS: Areas of lung scarring and emphysema are stable. No convincing infiltrate and no pulmonary edema. No pneumothorax or pleural effusion.  Cardiopericardial silhouette is normal in size. Mediastinum is normal in contour.  IMPRESSION: 1. No acute findings in the lungs.  No change from  the prior exam.   Electronically Signed   By: Amie Portland M.D.   On: 02/15/2013 07:39   Dg Chest Port 1 View  02/13/2013   CLINICAL DATA:  Shortness of breath  EXAM: PORTABLE CHEST - 1 VIEW  COMPARISON:  01/21/2013  FINDINGS: Cardiac shadow is stable. Chronic changes are noted in the lungs bilaterally. Chronic changes in the right rib cage are noted as well. The changes in the right lateral lung base have improved in the interval. No new focal infiltrate is seen. Scarring is noted in the left base.  IMPRESSION: Improved aeration in the right lung base. Chronic changes are identified.   Electronically Signed   By: Alcide Clever M.D.   On: 02/13/2013 13:32   Dg Chest Portable 1 View  01/21/2013   CLINICAL DATA:  Tachycardia. Short of breath.  EXAM: PORTABLE CHEST - 1 VIEW  COMPARISON:  10/23/2012.  FINDINGS: Chronic bilateral pulmonary parenchymal scarring is present  with elevation of the left hemidiaphragm. Rib deformity is present on the right, which is chronic. Monitoring leads project over the chest. Cardiopericardial silhouette is mildly enlarged for projection. Mediastinal contours appear similar to the prior exam.  Compared to prior exam, there is a new small focus of airspace disease near the right costophrenic angle with a probable small right pleural effusion suspicious for pneumonia. Followup to ensure radiographic clearing and exclude an underlying lesion is recommended. Typically clearing will be observed at 8 weeks.  IMPRESSION: Small focus of airspace density at the right costophrenic angle is new compared to prior exam, suspicious for pneumonia. Symmetric/atypical pulmonary edema considered less likely.   Electronically Signed   By: Andreas Newport M.D.   On: 01/21/2013 14:11    Assessment/Plan:  1 COPD-continue bronchodilators and antibiotics.  2 pulmonary hypertension-predominant issue; severe with cor pulmonale; patient appears to be euvolemic on examination.  Continue digoxin and Coumadin. Agree with transplant evaluation at [facility]. Creatinine trending upward.  Will continue current dose of lasix but if creatinine increases further will have to cut back.   3 CAD-continue effient for DES 2/14; continue statin.  4 possible atrial fibrillation noted on recent Holter monitor-telemetry has been reviewed. He remains in sinus with frequent PACs and PVCs. Continue Coreg, digoxin and Coumadin. There has been no bradycardia noted. If he has more frequent palpitations after discharge will consider CardioNet. Options for antiarrhythmic therapy would be limited. He would not be a candidate for flecainide given his history of coronary artery disease. Amiodarone would most likely need to be avoided given severity of pulmonary disease. Renal insufficiency would most likely preclude tikosyn. For now will increase Coreg to 12.5mg  BID for elevated HR which goes  as high as 170's 5 hypertension-continue present medications.  6 right ventricular thrombus-continue Coumadin    Quintella Reichert, MD  02/15/2013  8:30 AM

## 2013-02-15 NOTE — Progress Notes (Signed)
PULMONARY  / CRITICAL CARE MEDICINE  Name: Danny [name] MRN: [id number] DOB: [birth year]    ADMISSION DATE:  02/13/2013  CHIEF COMPLAINT: Hemoptysis  BRIEF PATIENT DESCRIPTION:  57 yo male former smoker admitted from pulmonary office with fatigue, cough, dyspnea, hypoxia, edema and increased sputum with hemoptysis.  Recent started on digoxin for A fib as outpt.  Followed by Dr. Delton Coombes in pulmonary office for GOLD 4 COPD s/p bullectomy, and cor pulmonale.  SIGNIFICANT EVENTS: 11/13 Admit from pulmonary office, cardiology consulted  STUDIES:  04/24/12 Spirometry >> FEV1 0.78 (26%), FEV1% 45 01/27/13 Echo >> mild LVH, EF 40 to 45%, grade 1 diastolic dysfx, thrombus Rt ventricular apex, mod/severe RA dilation  LINES / TUBES: PIV  CULTURES: Blood 11/13 >> Urine 11/13 >> Pneumococcal Ag 11/13 >> negative Legionella Ag 11/13 >>   ANTIBIOTICS: Vancomycin 11/3 >> 11/14 Zosyn 11/13 >> 11/14 Levaquin 11/14  SUBJECTIVE:  Breathing improved.  C/o constipation.  Not as much sputum.  No further blood in sputum.  Had several episodes of epistaxis over past two months.  Had chills and then sweats last night.  VITAL SIGNS: Temp:  [97.8 F (36.6 C)-98.7 F (37.1 C)] 98.7 F (37.1 C) (11/15 0538) Pulse Rate:  [60-92] 89 (11/15 0538) Resp:  [18-19] 18 (11/15 0538) BP: (125-141)/(60-87) 141/87 mmHg (11/15 0538) SpO2:  [88 %-98 %] 91 % (11/15 1040)  PHYSICAL EXAMINATION: General: No distress Neuro: Normal strength HEENT: No sinus tenderness Cardiovascular:  Regular, no murmur Lungs: decreased breath sounds, no wheeze Abdomen: soft, non tender Musculoskeletal: ankle edema Skin: no rashes  Labs: CBC Recent Labs     02/13/13  1334  02/15/13  0524  WBC  9.0  21.1*  HGB  12.7*  11.4*  HCT  38.3*  34.9*  PLT  261  289    Coag's Recent Labs     02/13/13  1334  02/14/13  0035  02/15/13  0524  APTT  31   --    --   INR  1.84*  1.89*  2.31*    BMET Recent Labs      02/13/13  1334  02/15/13  0524  NA  138  141  K  3.8  4.4  CL  98  101  CO2  32  32  BUN  16  24*  CREATININE  1.42*  1.53*  GLUCOSE  103*  186*    Electrolytes Recent Labs     02/13/13  1334  02/15/13  0524  CALCIUM  9.9  9.6  MG  2.0   --   PHOS  3.2   --     Sepsis Markers Recent Labs     02/13/13  1333  PROCALCITON  <0.10    ABG Recent Labs     02/13/13  1455  PHART  7.443  PCO2ART  45.0  PO2ART  54.8*    Liver Enzymes Recent Labs     02/13/13  1334  AST  15  ALT  14  ALKPHOS  67  BILITOT  0.7  ALBUMIN  3.6    Cardiac Enzymes Recent Labs     02/13/13  1332  02/13/13  1334  02/13/13  1829  02/14/13  0035  TROPONINI   --   <0.30  <0.30  <0.30  PROBNP  2361.0*   --    --    --     Glucose No results found for this basename: GLUCAP,  in the last 72 hours  Imaging Dg Chest Port 1 View  02/15/2013   CLINICAL DATA:  Followup COPD.  Shortness of Breath.  EXAM: PORTABLE CHEST - 1 VIEW  COMPARISON:  02/13/2013  FINDINGS: Areas of lung scarring and emphysema are stable. No convincing infiltrate and no pulmonary edema. No pneumothorax or pleural effusion.  Cardiopericardial silhouette is normal in size. Mediastinum is normal in contour.  IMPRESSION: 1. No acute findings in the lungs.  No change from the prior exam.   Electronically Signed   By: Amie Portland M.D.   On: 02/15/2013 07:39   Dg Chest Port 1 View  02/13/2013   CLINICAL DATA:  Shortness of breath  EXAM: PORTABLE CHEST - 1 VIEW  COMPARISON:  01/21/2013  FINDINGS: Cardiac shadow is stable. Chronic changes are noted in the lungs bilaterally. Chronic changes in the right rib cage are noted as well. The changes in the right lateral lung base have improved in the interval. No new focal infiltrate is seen. Scarring is noted in the left base.  IMPRESSION: Improved aeration in the right lung base. Chronic changes are identified.   Electronically Signed   By: Alcide Clever M.D.   On: 02/13/2013 13:32        ASSESSMENT / PLAN:  A: Acute on chronic respiratory failure with hypoxia/hypercapnia. Acute on chronic cor pulmonale. AECOPD with hx of GOLD 4 COPD and bullous emphysema >> being [facility]. Hemoptysis >> very small amount. Secondary pulmonary hypertension. Lt upper lobe pulmonary nodule on CT chest 10/23/12. P: -wean off solumedrol as tolerated => on 40mg  Q12h -change to brovana, budesonide via NEB BID 11/14 -prn albuterol, atrovent also via Neb -oxygen to keep SpO2 > 92% -f/u CXR 11/15 => chr changes COPD & sl vol loss on left from prev surg -no evidence for PNA on CXR >> change Abx to levaquin 11/14 -continue BiPAP qhs  A: Intermittent tachycardia with A fib noted on outpt holter monitoring. Chronic systolic heart failure. Hx of HTN, RV thrombus, CAD s/p stenting. P: -okay to continue coumadin per pharmacy -coreg, catapres, digoxin, cozaar, effient, crestor per cardiology -monitor on tele -continue lasix  A: Stage 2 CKD. P: -monitor renal fx, urine outpt, electrolytes  A: Constipation. P: -adjust bowel regimen   Berneta Sconyers M. Kriste Basque, MD [facility] 02/15/2013, 12:18 PM

## 2013-02-16 DIAGNOSIS — I471 Supraventricular tachycardia: Secondary | ICD-10-CM | POA: Diagnosis not present

## 2013-02-16 LAB — PROTIME-INR
INR: 2.45 — ABNORMAL HIGH (ref 0.00–1.49)
Prothrombin Time: 25.8 seconds — ABNORMAL HIGH (ref 11.6–15.2)

## 2013-02-16 MED ORDER — WARFARIN SODIUM 3 MG PO TABS
3.0000 mg | ORAL_TABLET | Freq: Once | ORAL | Status: AC
  Administered 2013-02-16: 17:00:00 3 mg via ORAL
  Filled 2013-02-16: qty 1

## 2013-02-16 MED ORDER — CARVEDILOL 25 MG PO TABS
25.0000 mg | ORAL_TABLET | Freq: Two times a day (BID) | ORAL | Status: DC
  Administered 2013-02-16 – 2013-02-18 (×5): 25 mg via ORAL
  Filled 2013-02-16 (×7): qty 1

## 2013-02-16 MED ORDER — PREDNISONE 20 MG PO TABS
20.0000 mg | ORAL_TABLET | Freq: Two times a day (BID) | ORAL | Status: DC
  Administered 2013-02-16 – 2013-02-18 (×4): 20 mg via ORAL
  Filled 2013-02-16 (×6): qty 1

## 2013-02-16 NOTE — Progress Notes (Signed)
PULMONARY  / CRITICAL CARE MEDICINE  Name: KEYLON LABELLE MRN: [id number] DOB: Mar 05, 1956    ADMISSION DATE:  02/13/2013  CHIEF COMPLAINT: Hemoptysis  BRIEF PATIENT DESCRIPTION:  57 yo male former smoker admitted from pulmonary office with fatigue, cough, dyspnea, hypoxia, edema and increased sputum with hemoptysis.  Recent started on digoxin for A fib as outpt.  Followed by Dr. Delton Coombes in pulmonary office for GOLD 4 COPD s/p bullectomy, and cor pulmonale.  SIGNIFICANT EVENTS: 11/13 Admit from pulmonary office, cardiology consulted  STUDIES:  04/24/12 Spirometry >> FEV1 0.78 (26%), FEV1% 45 01/27/13 Echo >> mild LVH, EF 40 to 45%, grade 1 diastolic dysfx, thrombus Rt ventricular apex, mod/severe RA dilation  LINES / TUBES: PIV  CULTURES: Blood 11/13 >> Urine 11/13 >> Pneumococcal Ag 11/13 >> negative Legionella Ag 11/13 >>   ANTIBIOTICS: Vancomycin 11/3 >> 11/14 Zosyn 11/13 >> 11/14 Levaquin 11/14  SUBJECTIVE:  Breathing improved.  C/o constipation.  Not as much sputum.  No further blood in sputum.  Had several episodes of epistaxis over past two months.  Had chills and then sweats last night.  VITAL SIGNS: Temp:  [97.6 F (36.4 C)-98.2 F (36.8 C)] 97.6 F (36.4 C) (11/16 0546) Pulse Rate:  [58-92] 58 (11/16 0546) Resp:  [18-20] 18 (11/16 0546) BP: (112-138)/(70-92) 112/79 mmHg (11/16 0546) SpO2:  [92 %-100 %] 92 % (11/16 0834) Weight:  [74.7 kg (164 lb 10.9 oz)] 74.7 kg (164 lb 10.9 oz) (11/16 0500)  PHYSICAL EXAMINATION: General: No distress Neuro: Normal strength HEENT: No sinus tenderness Cardiovascular:  Regular, no murmur Lungs: decreased breath sounds, no wheeze Abdomen: soft, non tender Musculoskeletal: ankle edema Skin: no rashes  Labs: CBC Recent Labs     02/13/13  1334  02/15/13  0524  WBC  9.0  21.1*  HGB  12.7*  11.4*  HCT  38.3*  34.9*  PLT  261  289    Coag's Recent Labs     02/13/13  1334  02/14/13  0035  02/15/13  0524   02/16/13  0533  APTT  31   --    --    --   INR  1.84*  1.89*  2.31*  2.45*    BMET Recent Labs     02/13/13  1334  02/15/13  0524  NA  138  141  K  3.8  4.4  CL  98  101  CO2  32  32  BUN  16  24*  CREATININE  1.42*  1.53*  GLUCOSE  103*  186*    Electrolytes Recent Labs     02/13/13  1334  02/15/13  0524  CALCIUM  9.9  9.6  MG  2.0   --   PHOS  3.2   --     Sepsis Markers Recent Labs     02/13/13  1333  PROCALCITON  <0.10    ABG Recent Labs     02/13/13  1455  PHART  7.443  PCO2ART  45.0  PO2ART  54.8*    Liver Enzymes Recent Labs     02/13/13  1334  AST  15  ALT  14  ALKPHOS  67  BILITOT  0.7  ALBUMIN  3.6    Cardiac Enzymes Recent Labs     02/13/13  1332  02/13/13  1334  02/13/13  1829  02/14/13  0035  TROPONINI   --   <0.30  <0.30  <0.30  PROBNP  2361.0*   --    --    --  Glucose No results found for this basename: GLUCAP,  in the last 72 hours  Imaging Dg Chest Port 1 View  02/15/2013   CLINICAL DATA:  Followup COPD.  Shortness of Breath.  EXAM: PORTABLE CHEST - 1 VIEW  COMPARISON:  02/13/2013  FINDINGS: Areas of lung scarring and emphysema are stable. No convincing infiltrate and no pulmonary edema. No pneumothorax or pleural effusion.  Cardiopericardial silhouette is normal in size. Mediastinum is normal in contour.  IMPRESSION: 1. No acute findings in the lungs.  No change from the prior exam.   Electronically Signed   By: Amie Portland M.D.   On: 02/15/2013 07:39       ASSESSMENT / PLAN:  A: Acute on chronic respiratory failure with hypoxia/hypercapnia. Acute on chronic cor pulmonale. AECOPD with hx of GOLD 4 COPD and bullous emphysema >> being evaluated at [facility] for transplant. Hemoptysis >> very small amount. Secondary pulmonary hypertension. Lt upper lobe pulmonary nodule on CT chest 10/23/12. P: -wean off solumedrol as tolerated => on 40mg  Q12h => change to PO Pred 20Bid.Marland Kitchen. -changed to brovana, budesonide via  NEB BID 11/14 -prn albuterol, atrovent also via Neb -oxygen to keep SpO2 > 92% -f/u CXR 11/15 => chr changes COPD & sl vol loss on left from prev surg -no evidence for PNA on CXR >> change Abx to Levaquin 11/14 -continue BiPAP qhs  A: Intermittent tachycardia with A fib noted on outpt holter monitoring. Chronic systolic heart failure. Hx of HTN, RV thrombus, CAD s/p stenting. P: -okay to continue coumadin per pharmacy -coreg, catapres, digoxin, cozaar, effient, crestor per cardiology -monitor on tele -continue lasix  A: Stage 2 CKD. P: -monitor renal fx, urine outpt, electrolytes  A: Constipation. P: -adjust bowel regimen   Dyane Broberg M. Kriste Basque, MD [place] [facility] 02/16/2013, 10:44 AM

## 2013-02-16 NOTE — Progress Notes (Signed)
ANTICOAGULATION CONSULT NOTE - Follow Up Consult  Pharmacy Consult for Warfarin Indication: RV mural thrombus  Allergies  Allergen Reactions  . Lipitor [Atorvastatin] Other (See Comments)    Muscle spasms     Patient Measurements: Height: 5\' 7"  (170.2 cm) Weight: 164 lb 10.9 oz (74.7 kg) IBW/kg (Calculated) : 66.1  Vital Signs: Temp: 97.6 F (36.4 C) (11/16 0546) Temp src: Oral (11/16 0546) BP: 112/79 mmHg (11/16 0546) Pulse Rate: 58 (11/16 0546)  Labs:  Recent Labs  02/13/13 1334 02/13/13 1829 02/14/13 0035 02/15/13 0524 02/16/13 0533  HGB 12.7*  --   --  11.4*  --   HCT 38.3*  --   --  34.9*  --   PLT 261  --   --  289  --   APTT 31  --   --   --   --   LABPROT 20.7*  --  21.1* 24.6* 25.8*  INR 1.84*  --  1.89* 2.31* 2.45*  CREATININE 1.42*  --   --  1.53*  --   TROPONINI <0.30 <0.30 <0.30  --   --     Estimated Creatinine Clearance: 51 ml/min (by C-G formula based on Cr of 1.53).   Medications:  Scheduled:  . arformoterol  15 mcg Nebulization BID  . budesonide (PULMICORT) nebulizer solution  0.25 mg Nebulization BID  . carvedilol  25 mg Oral BID WC  . cloNIDine  0.1 mg Oral TID  . digoxin  0.125 mg Oral Daily  . docusate sodium  100 mg Oral BID  . furosemide  40 mg Oral Daily  . levofloxacin (LEVAQUIN) IV  500 mg Intravenous Q24H  . losartan  100 mg Oral Daily  . methylPREDNISolone (SOLU-MEDROL) injection  40 mg Intravenous Q12H  . pantoprazole  40 mg Oral Q1200  . prasugrel  10 mg Oral Daily  . rosuvastatin  10 mg Oral q1800  . Warfarin - Pharmacist Dosing Inpatient   Does not apply q1800   Assessment: 57 yo male on chronic warfarin for history RV mural thrombus. Order to continue per Pharmacy protocol on admission.  Home dose is 3.75mg  daily  INR (2.45) is therapeutic  CBC 11/15: Hgb (11.4) decreased and Plt remain wnl.  No bleeding/complications reported  Patient also takes prasugrel  - monitor closely for bleeding  Noted drug-drug  interaction with Levaquin (will prolong INR) started 11/14.  Goal of Therapy:  INR 2-3   Plan:   Warfarin 3 mg today at 1800 x1  Daily PT/INR   Lynann Beaver PharmD, BCPS Pager [phone] 02/16/2013 9:08 AM

## 2013-02-16 NOTE — Progress Notes (Signed)
SUBJECTIVE:  Still having runs of atrial tachycardia up to 155 bpm  OBJECTIVE:   Vitals:   Filed Vitals:   02/15/13 1500 02/15/13 2206 02/16/13 0500 02/16/13 0546  BP: 125/70 138/92  112/79  Pulse: 92 73  58  Temp: 97.8 F (36.6 C) 98.2 F (36.8 C)  97.6 F (36.4 C)  TempSrc: Oral Oral  Oral  Resp: 20 18  18   Height:      Weight:   164 lb 10.9 oz (74.7 kg)   SpO2: 95% 98%  100%   I&O's:   Intake/Output Summary (Last 24 hours) at 02/16/13 0750 Last data filed at 02/16/13 0500  Gross per 24 hour  Intake   1880 ml  Output   1100 ml  Net    780 ml   TELEMETRY: Reviewed telemetry pt in atrial tachycardia up to 155bpm     PHYSICAL EXAM General: Well developed, well nourished, in no acute distress Head: Eyes PERRLA, No xanthomas.   Normal cephalic and atramatic  Lungs:   Clear bilaterally to auscultation and percussion. Heart:   HRRR with runs of tachycardia  S1 S2 Pulses are 2+ & equal. Abdomen: Bowel sounds are positive, abdomen soft and non-tender without masses  Extremities:   No clubbing, cyanosis or edema.  DP +1 Neuro: Alert and oriented X 3. Psych:  Good affect, responds appropriately   LABS: Basic Metabolic Panel:  Recent Labs  29/52/84 1334 02/15/13 0524  NA 138 141  K 3.8 4.4  CL 98 101  CO2 32 32  GLUCOSE 103* 186*  BUN 16 24*  CREATININE 1.42* 1.53*  CALCIUM 9.9 9.6  MG 2.0  --   PHOS 3.2  --    Liver Function Tests:  Recent Labs  02/13/13 1334  AST 15  ALT 14  ALKPHOS 67  BILITOT 0.7  PROT 7.2  ALBUMIN 3.6    Recent Labs  02/13/13 1334  LIPASE 37  AMYLASE 124*   CBC:  Recent Labs  02/13/13 1334 02/15/13 0524  WBC 9.0 21.1*  NEUTROABS 5.4  --   HGB 12.7* 11.4*  HCT 38.3* 34.9*  MCV 99.5 100.3*  PLT 261 289   Cardiac Enzymes:  Recent Labs  02/13/13 1334 02/13/13 1829 02/14/13 0035  TROPONINI <0.30 <0.30 <0.30   Coag Panel:   Lab Results  Component Value Date   INR 2.45* 02/16/2013   INR 2.31* 02/15/2013   INR 1.89* 02/14/2013    RADIOLOGY: Dg Chest Port 1 View  02/15/2013   CLINICAL DATA:  Followup COPD.  Shortness of Breath.  EXAM: PORTABLE CHEST - 1 VIEW  COMPARISON:  02/13/2013  FINDINGS: Areas of lung scarring and emphysema are stable. No convincing infiltrate and no pulmonary edema. No pneumothorax or pleural effusion.  Cardiopericardial silhouette is normal in size. Mediastinum is normal in contour.  IMPRESSION: 1. No acute findings in the lungs.  No change from the prior exam.   Electronically Signed   By: Amie Portland M.D.   On: 02/15/2013 07:39   Dg Chest Port 1 View  02/13/2013   CLINICAL DATA:  Shortness of breath  EXAM: PORTABLE CHEST - 1 VIEW  COMPARISON:  01/21/2013  FINDINGS: Cardiac shadow is stable. Chronic changes are noted in the lungs bilaterally. Chronic changes in the right rib cage are noted as well. The changes in the right lateral lung base have improved in the interval. No new focal infiltrate is seen. Scarring is noted in the left base.  IMPRESSION: Improved  aeration in the right lung base. Chronic changes are identified.   Electronically Signed   By: Alcide Clever M.D.   On: 02/13/2013 13:32   Dg Chest Portable 1 View  01/21/2013   CLINICAL DATA:  Tachycardia. Short of breath.  EXAM: PORTABLE CHEST - 1 VIEW  COMPARISON:  10/23/2012.  FINDINGS: Chronic bilateral pulmonary parenchymal scarring is present with elevation of the left hemidiaphragm. Rib deformity is present on the right, which is chronic. Monitoring leads project over the chest. Cardiopericardial silhouette is mildly enlarged for projection. Mediastinal contours appear similar to the prior exam.  Compared to prior exam, there is a new small focus of airspace disease near the right costophrenic angle with a probable small right pleural effusion suspicious for pneumonia. Followup to ensure radiographic clearing and exclude an underlying lesion is recommended. Typically clearing will be observed at 8 weeks.   IMPRESSION: Small focus of airspace density at the right costophrenic angle is new compared to prior exam, suspicious for pneumonia. Symmetric/atypical pulmonary edema considered less likely.   Electronically Signed   By: Andreas Newport M.D.   On: 01/21/2013 14:11    Assessment/Plan:  1 COPD-continue bronchodilators and antibiotics.  2 pulmonary hypertension-predominant issue; severe with cor pulmonale; patient appears to be euvolemic on examination. Continue digoxin and Coumadin. Agree with transplant evaluation at [facility]. Creatinine trending upward. Will continue current dose of lasix but if creatinine increases further will have to cut back. BMET pending this am. 3 CAD-continue effient for DES 2/14; continue statin.  4 possible atrial fibrillation noted on recent Holter monitor-telemetry has been reviewed. He remains in sinus with frequent PACs and PVCs. Continue Coreg, digoxin and Coumadin. There has been no bradycardia noted. If he has more frequent palpitations after discharge will consider CardioNet. Options for antiarrhythmic therapy would be limited. He would not be a candidate for flecainide given his history of coronary artery disease. Amiodarone would most likely need to be avoided given severity of pulmonary disease. Renal insufficiency would most likely preclude tikosyn. Will increase Coreg to 25mg  BID for elevated HR which goes as high as 150's with short runs of atrial tachycardia 5 hypertension-continue present medications.  6 right ventricular thrombus-continue Coumadin    Quintella Reichert, MD  02/16/2013  7:50 AM

## 2013-02-17 LAB — BASIC METABOLIC PANEL
Calcium: 9.6 mg/dL (ref 8.4–10.5)
Creatinine, Ser: 1.26 mg/dL (ref 0.50–1.35)
GFR calc non Af Amer: 63 mL/min — ABNORMAL LOW (ref >90)
Glucose, Bld: 172 mg/dL — ABNORMAL HIGH (ref 70–99)
Potassium: 4.4 mEq/L (ref 3.5–5.1)
Sodium: 139 mEq/L (ref 135–145)

## 2013-02-17 LAB — CBC
MCH: 32.6 pg (ref 26.0–34.0)
MCHC: 33.1 g/dL (ref 30.0–36.0)
MCV: 98.7 fL (ref 78.0–100.0)
Platelets: 281 10*3/uL (ref 150–400)
RBC: 3.8 MIL/uL — ABNORMAL LOW (ref 4.22–5.81)
RDW: 13.3 % (ref 11.5–15.5)
WBC: 19.2 10*3/uL — ABNORMAL HIGH (ref 4.0–10.5)

## 2013-02-17 LAB — PROTIME-INR: Prothrombin Time: 30.2 seconds — ABNORMAL HIGH (ref 11.6–15.2)

## 2013-02-17 MED ORDER — WARFARIN 0.5 MG HALF TABLET
0.5000 mg | ORAL_TABLET | Freq: Once | ORAL | Status: AC
  Administered 2013-02-17: 17:00:00 0.5 mg via ORAL
  Filled 2013-02-17: qty 1

## 2013-02-17 MED ORDER — LEVOFLOXACIN 500 MG PO TABS
500.0000 mg | ORAL_TABLET | Freq: Every day | ORAL | Status: DC
  Administered 2013-02-17 – 2013-02-18 (×2): 500 mg via ORAL
  Filled 2013-02-17 (×2): qty 1

## 2013-02-17 NOTE — Progress Notes (Signed)
Pt placed on BIPAP 8/6 with 2 LPM O2 bleed in via nasal mask.  Pt tolerating well at this time, RT to monitor and assess as needed.

## 2013-02-17 NOTE — Progress Notes (Signed)
Placed patient on Bipap. RT bleed in  5L  of oxygen per home regimine. Patient tolerating at this time.

## 2013-02-17 NOTE — Telephone Encounter (Signed)
Thank you :)

## 2013-02-17 NOTE — Progress Notes (Signed)
PHARMACIST - PHYSICIAN COMMUNICATION DR:   CCM CONCERNING: Antibiotic IV to Oral Route Change Policy  RECOMMENDATION: This patient is receiving Levaquin by the intravenous route.  Based on criteria approved by the Pharmacy and Therapeutics Committee, the antibiotic(s) is/are being converted to the equivalent oral dose form(s).   DESCRIPTION: These criteria include:  Patient being treated for a respiratory tract infection, urinary tract infection, cellulitis or clostridium difficile associated diarrhea if on metronidazole  The patient is not neutropenic and does not exhibit a GI malabsorption state  The patient is eating (either orally or via tube) and/or has been taking other orally administered medications for a least 24 hours  The patient is improving clinically and has a Tmax < 100.5  If you have questions about this conversion, please contact the Pharmacy Department  []   [phone] )  [facility] []   [phone] )  [facility]  []   [phone] )  [facility] [x]   [phone] )  [facility]   Loralee Pacas, PharmD, BCPS Pager: [phone] 02/17/2013 7:46 AM

## 2013-02-17 NOTE — Progress Notes (Signed)
Patient ambulated on 2L nasal cannula. During walk, patients oxygen saturation dropped to 85% and patient was short of breath. Gradually increased up to 4L to obtain a saturation of 91-91%. Patient stated that he felt "much better" with 4L while ambulating. Very mild shortness of breath noted on the 4L. Patient in bed resting, placed on 2L at rest to obtain a saturation of 93%. Will continue to monitor patient. Setzer, Don Broach

## 2013-02-17 NOTE — Progress Notes (Addendum)
PULMONARY  / CRITICAL CARE MEDICINE  Name: Danny [name] MRN: [id number] DOB: July 16, 1955    ADMISSION DATE:  02/13/2013  CHIEF COMPLAINT: Hemoptysis/SOB  BRIEF PATIENT DESCRIPTION:  57 yo male former smoker admitted from pulmonary office with fatigue, cough, dyspnea, hypoxia, edema and increased sputum with hemoptysis.  Recent started on digoxin for A fib as outpt.  Followed by Dr. Delton Coombes in pulmonary office for GOLD 4 COPD s/p bullectomy, and cor pulmonale.  SIGNIFICANT EVENTS: 11/13 Admit from pulmonary office, cardiology consulted  STUDIES:  04/24/12 Spirometry >> FEV1 0.78 (26%), FEV1% 45 01/27/13 Echo >> mild LVH, EF 40 to 45%, grade 1 diastolic dysfx, thrombus Rt ventricular apex, mod/severe RA dilation  LINES / TUBES: PIV  CULTURES: Blood 11/13 >> Urine 11/13 >>neg Pneumococcal Ag 11/13 >> negative Legionella Ag 11/13 >>   ANTIBIOTICS: Vancomycin 11/3 >> 11/14 Zosyn 11/13 >> 11/14 Levaquin 11/14>>  SUBJECTIVE:  Breathing improved.  Has not ambulated as of yet.  VITAL SIGNS: Temp:  [97.5 F (36.4 C)-98.1 F (36.7 C)] 97.8 F (36.6 C) (11/17 0618) Pulse Rate:  [70-125] 83 (11/17 0801) Resp:  [18] 18 (11/17 0618) BP: (102-132)/(72-85) 132/72 mmHg (11/17 0801) SpO2:  [93 %-100 %] 100 % (11/17 0845) Weight:  [165 lb 9.1 oz (75.1 kg)] 165 lb 9.1 oz (75.1 kg) (11/17 0618)  PHYSICAL EXAMINATION: General: No distress Neuro: Normal strength HEENT: No sinus tenderness Cardiovascular:  Regular, no murmur, monitor with PVC's Lungs: decreased breath sounds, no wheeze Abdomen: soft, non tender Musculoskeletal: decreased ankle edema Skin: no rashes  Labs: CBC Recent Labs     02/15/13  0524  02/17/13  0459  WBC  21.1*  19.2*  HGB  11.4*  12.4*  HCT  34.9*  37.5*  PLT  289  281    Coag's Recent Labs     02/15/13  0524  02/16/13  0533  02/17/13  0459  INR  2.31*  2.45*  3.02*    BMET Recent Labs     02/15/13  0524  02/17/13  0459  NA  141  139  K   4.4  4.4  CL  101  98  CO2  32  33*  BUN  24*  21  CREATININE  1.53*  1.26  GLUCOSE  186*  172*    Electrolytes Recent Labs     02/15/13  0524  02/17/13  0459  CALCIUM  9.6  9.6    Sepsis Markers No results found for this basename: LACTICACIDVEN, PROCALCITON, O2SATVEN,  in the last 72 hours  ABG No results found for this basename: PHART, PCO2ART, PO2ART,  in the last 72 hours  Liver Enzymes No results found for this basename: AST, ALT, ALKPHOS, BILITOT, ALBUMIN,  in the last 72 hours  Cardiac Enzymes No results found for this basename: TROPONINI, PROBNP,  in the last 72 hours  Glucose No results found for this basename: GLUCAP,  in the last 72 hours  Imaging No results found.    Intake/Output Summary (Last 24 hours) at 02/17/13 1021 Last data filed at 02/17/13 0759  Gross per 24 hour  Intake    940 ml  Output   1225 ml  Net   -285 ml    ASSESSMENT / PLAN:  A: Acute on chronic respiratory failure with hypoxia/hypercapnia. Acute on chronic cor pulmonale. AECOPD with hx of GOLD 4 COPD and bullous emphysema >> being evaluated at [facility] for [facility]. Hemoptysis >> very small amount. Secondary pulmonary hypertension. Lt upper  lobe pulmonary nodule on CT chest 10/23/12. P: -wean off solumedrol as tolerated => on 40mg  Q12h => change to PO Pred 20Bid. 11-16 -changed to brovana, budesonide via NEB BID 11/14 -prn albuterol, atrovent also via Neb -oxygen to keep SpO2 > 92% -f/u CXR 11/15 => chr changes COPD & sl vol loss on left from prev surg -no evidence for PNA on CXR >> change Abx to Levaquin 11/14 -continue BiPAP qhs -ambulate and record any desaturation 11-17  A: Intermittent tachycardia with A fib noted on outpt holter monitoring. Chronic systolic heart failure. Hx of HTN, RV thrombus, CAD s/p stenting. Lab Results  Component Value Date   INR 3.02* 02/17/2013   INR 2.45* 02/16/2013   INR 2.31* 02/15/2013    P: -okay to continue coumadin per  pharmacy -coreg, catapres, digoxin, cozaar, effient, crestor per cardiology -monitor on tele -continue lasix  A: Stage 2 CKD. Lab Results  Component Value Date   CREATININE 1.26 02/17/2013   CREATININE 1.53* 02/15/2013   CREATININE 1.42* 02/13/2013   CREATININE 1.35 10/14/2012   CREATININE 1.31 10/02/2012    Recent Labs Lab 02/13/13 1334 02/15/13 0524 02/17/13 0459  NA 138 141 139    Recent Labs Lab 02/13/13 1334 02/15/13 0524 02/17/13 0459  K 3.8 4.4 4.4      P: -monitor renal fx, urine outpt, electrolytes  A: Constipation. P: -adjust bowel regimen  Summary: Better, needs ambulation with sats monitored. Nearing dc from pulmonary standpoint.   Brett Canales Minor ACNP Adolph Pollack PCCM Pager [phone] till 3 pm If no answer page [phone] 02/17/2013, 10:22 AM   PCCM ATTENDING: I have interviewed and examined the patient and reviewed the database. I have formulated the assessment and plan as reflected in the note above with amendments made by me.   We talked at length re: his disease and possibility of lung transplantation. He knows that heart disease is a relative contraindication to transplantation. I encouraged that he enroll in cardiac rehab (having recently completed pulmonary rehab)  Likely ready for discharge to home 11/18  Would adjust his pulmonary medication regimen as follows: Maintenance meds: Budesonide 0.5 mg nebulized q 12 hrs Aformoterol 15 mcg nebulized q 12 hrs D/C Advair  Rescue medication: Duoneb q 4 hrs PRN Or  Albuterol MDI 2-4 actuations q 4 hrs PRN  Other med changes: D/C Ramipril   Billy Fischer, MD;  PCCM service; [place] [phone]

## 2013-02-17 NOTE — Progress Notes (Signed)
     SUBJECTIVE: No complaints this am.   Tele: sinus with frequent PACs  BP 121/85  Pulse 70  Temp(Src) 97.8 F (36.6 C) (Oral)  Resp 18  Ht 5\' 7"  (1.702 m)  Wt 165 lb 9.1 oz (75.1 kg)  BMI 25.93 kg/m2  SpO2 100%  Intake/Output Summary (Last 24 hours) at 02/17/13 0739 Last data filed at 02/17/13 1610  Gross per 24 hour  Intake    820 ml  Output   1425 ml  Net   -605 ml    PHYSICAL EXAM General: Well developed, well nourished, in no acute distress. Alert and oriented x 3.  Psych:  Good affect, responds appropriately Neck: No JVD. No masses noted.  Lungs: Decreased BS bilaterally  Heart: RRR with ectopy with no murmurs noted. Abdomen: Bowel sounds are present. Soft, non-tender.  Extremities: No lower extremity edema.   LABS: Basic Metabolic Panel:  Recent Labs  96/04/54 0524 02/17/13 0459  NA 141 139  K 4.4 4.4  CL 101 98  CO2 32 33*  GLUCOSE 186* 172*  BUN 24* 21  CREATININE 1.53* 1.26  CALCIUM 9.6 9.6   CBC:  Recent Labs  02/15/13 0524 02/17/13 0459  WBC 21.1* 19.2*  HGB 11.4* 12.4*  HCT 34.9* 37.5*  MCV 100.3* 98.7  PLT 289 281   Current Meds: . arformoterol  15 mcg Nebulization BID  . budesonide (PULMICORT) nebulizer solution  0.25 mg Nebulization BID  . carvedilol  25 mg Oral BID WC  . cloNIDine  0.1 mg Oral TID  . digoxin  0.125 mg Oral Daily  . docusate sodium  100 mg Oral BID  . furosemide  40 mg Oral Daily  . levofloxacin (LEVAQUIN) IV  500 mg Intravenous Q24H  . losartan  100 mg Oral Daily  . pantoprazole  40 mg Oral Q1200  . prasugrel  10 mg Oral Daily  . predniSONE  20 mg Oral BID WC  . rosuvastatin  10 mg Oral q1800  . Warfarin - Pharmacist Dosing Inpatient   Does not apply q1800     ASSESSMENT AND PLAN:  1. COPD: Continue bronchodilators and antibiotics per PCCM.   2. Pulmonary hypertension: Predominant issue and likely driving his atrial arrythmias. Severe with cor pulmonale. Patient appears to be euvolemic on  examination. Continue digoxin and Coumadin. Agree with transplant evaluation.  3. CAD: Continue effient for DES 2/14; continue statin.   4. Atrial arrythmias: Sinus this am with frequent PACs. Telemetry is reviewed and reveals atrial tach with frequent PACs and PVCs while in sinus. Possible atrial fibrillation noted on recent Holter monitor. Continue Coreg, digoxin and Coumadin. There has been no bradycardia noted.Options for antiarrhythmic therapy would be limited. He would not be a candidate for flecainide given his history of coronary artery disease. Amiodarone would most likely need to be avoided given severity of pulmonary disease. Renal insufficiency would most likely preclude tikosyn. Will follow.   5. Hypertension: BP controlled. Continue current meds.    6. Right ventricular thrombus: Continue Coumadin    Osha Rane  11/17/20147:39 AM

## 2013-02-17 NOTE — Progress Notes (Signed)
ANTICOAGULATION CONSULT NOTE - Follow Up Consult  Pharmacy Consult for Warfarin Indication: RV mural thrombus  Allergies  Allergen Reactions  . Lipitor [Atorvastatin] Other (See Comments)    Muscle spasms     Patient Measurements: Height: 5\' 7"  (170.2 cm) Weight: 165 lb 9.1 oz (75.1 kg) IBW/kg (Calculated) : 66.1  Vital Signs: Temp: 97.8 F (36.6 C) (11/17 0618) Temp src: Oral (11/17 0618) BP: 121/85 mmHg (11/17 0618) Pulse Rate: 70 (11/17 0618)  Labs:  Recent Labs  02/15/13 0524 02/16/13 0533 02/17/13 0459  HGB 11.4*  --  12.4*  HCT 34.9*  --  37.5*  PLT 289  --  281  LABPROT 24.6* 25.8* 30.2*  INR 2.31* 2.45* 3.02*  CREATININE 1.53*  --  1.26    Estimated Creatinine Clearance: 61.9 ml/min (by C-G formula based on Cr of 1.26).   Medications:  Scheduled:  . arformoterol  15 mcg Nebulization BID  . budesonide (PULMICORT) nebulizer solution  0.25 mg Nebulization BID  . carvedilol  25 mg Oral BID WC  . cloNIDine  0.1 mg Oral TID  . digoxin  0.125 mg Oral Daily  . docusate sodium  100 mg Oral BID  . furosemide  40 mg Oral Daily  . levofloxacin (LEVAQUIN) IV  500 mg Intravenous Q24H  . losartan  100 mg Oral Daily  . pantoprazole  40 mg Oral Q1200  . prasugrel  10 mg Oral Daily  . predniSONE  20 mg Oral BID WC  . rosuvastatin  10 mg Oral q1800  . Warfarin - Pharmacist Dosing Inpatient   Does not apply q1800   Assessment: 57 yo male on chronic warfarin for history RV mural thrombus. Order to continue per Pharmacy protocol on admission.  Home dose is 3.75mg  daily  INR essentially therapeutic (3.02) but rising - likely 2/2 interaction with levaquin started 11/14  CBC stable. No further epistaxis reported  Patient also takes prasugrel  - monitor closely for bleeding  Goal of Therapy:  INR 2-3   Plan:   Decrease warfarin 0.5 mg today at 1800 x1  Daily PT/INR   Loralee Pacas, PharmD, BCPS Pager: [phone]  02/17/2013 7:40 AM

## 2013-02-18 LAB — BASIC METABOLIC PANEL
BUN: 23 mg/dL (ref 6–23)
Calcium: 9.4 mg/dL (ref 8.4–10.5)
Chloride: 94 mEq/L — ABNORMAL LOW (ref 96–112)
Creatinine, Ser: 1.22 mg/dL (ref 0.50–1.35)
GFR calc Af Amer: 75 mL/min — ABNORMAL LOW (ref >90)
GFR calc non Af Amer: 65 mL/min — ABNORMAL LOW (ref >90)
Glucose, Bld: 193 mg/dL — ABNORMAL HIGH (ref 70–99)
Potassium: 5.3 mEq/L — ABNORMAL HIGH (ref 3.5–5.1)

## 2013-02-18 LAB — PROTIME-INR: Prothrombin Time: 25.7 seconds — ABNORMAL HIGH (ref 11.6–15.2)

## 2013-02-18 MED ORDER — IPRATROPIUM-ALBUTEROL 0.5-2.5 (3) MG/3ML IN SOLN: 3.0000 mL | Freq: Four times a day (QID) | RESPIRATORY_TRACT | Status: DC | PRN

## 2013-02-18 MED ORDER — CARVEDILOL 25 MG PO TABS: 25.0000 mg | ORAL_TABLET | Freq: Two times a day (BID) | ORAL | Status: DC

## 2013-02-18 MED ORDER — ARFORMOTEROL TARTRATE 15 MCG/2ML IN NEBU: 15.0000 ug | INHALATION_SOLUTION | Freq: Two times a day (BID) | RESPIRATORY_TRACT | Status: DC

## 2013-02-18 MED ORDER — PREDNISONE 10 MG PO TABS: ORAL_TABLET | ORAL | Status: DC

## 2013-02-18 MED ORDER — WARFARIN SODIUM 2.5 MG PO TABS
2.5000 mg | ORAL_TABLET | Freq: Once | ORAL | Status: DC
  Filled 2013-02-18: qty 1

## 2013-02-18 MED ORDER — WARFARIN SODIUM 2.5 MG PO TABS: 2.5000 mg | ORAL_TABLET | Freq: Every day | ORAL | Status: DC

## 2013-02-18 MED ORDER — BUDESONIDE 0.25 MG/2ML IN SUSP: 0.2500 mg | Freq: Two times a day (BID) | RESPIRATORY_TRACT | Status: DC

## 2013-02-18 NOTE — Addendum Note (Signed)
Encounter addended by: Angelica Pou, RN on: 02/18/2013 12:26 PM<BR>     Documentation filed: Notes Section

## 2013-02-18 NOTE — Progress Notes (Signed)
     SUBJECTIVE: Breathing is better. No pain.   Tele: Sinus with frequent PACs/PVCs.   BP 147/91  Pulse 65  Temp(Src) 98.2 F (36.8 C) (Oral)  Resp 20  Ht 5\' 7"  (1.702 m)  Wt 164 lb 14.5 oz (74.8 kg)  BMI 25.82 kg/m2  SpO2 95%  Intake/Output Summary (Last 24 hours) at 02/18/13 1610 Last data filed at 02/18/13 9604  Gross per 24 hour  Intake    480 ml  Output   2325 ml  Net  -1845 ml    PHYSICAL EXAM General: Well developed, well nourished, in no acute distress. Alert and oriented x 3.  Psych:  Good affect, responds appropriately Neck: No JVD. No masses noted.  Lungs: Decreased BS bilaterally  Heart: RRR with ectopy with no murmurs noted. Abdomen: Bowel sounds are present. Soft, non-tender.  Extremities: No lower extremity edema.   LABS: Basic Metabolic Panel:  Recent Labs  54/09/81 0459 02/18/13 0435  NA 139 132*  K 4.4 5.3*  CL 98 94*  CO2 33* 30  GLUCOSE 172* 193*  BUN 21 23  CREATININE 1.26 1.22  CALCIUM 9.6 9.4   CBC:  Recent Labs  02/17/13 0459  WBC 19.2*  HGB 12.4*  HCT 37.5*  MCV 98.7  PLT 281   Current Meds: . arformoterol  15 mcg Nebulization BID  . budesonide (PULMICORT) nebulizer solution  0.25 mg Nebulization BID  . carvedilol  25 mg Oral BID WC  . cloNIDine  0.1 mg Oral TID  . digoxin  0.125 mg Oral Daily  . docusate sodium  100 mg Oral BID  . furosemide  40 mg Oral Daily  . levofloxacin  500 mg Oral Daily  . losartan  100 mg Oral Daily  . pantoprazole  40 mg Oral Q1200  . prasugrel  10 mg Oral Daily  . predniSONE  20 mg Oral BID WC  . rosuvastatin  10 mg Oral q1800  . Warfarin - Pharmacist Dosing Inpatient   Does not apply q1800     ASSESSMENT AND PLAN:  1. COPD: Continue bronchodilators and antibiotics per PCCM.   2. Pulmonary hypertension: Severe with cor pulmonale. Predominant issue and likely driving his atrial arrythmias.  Patient appears to be euvolemic on examination. Continue digoxin and Coumadin.   3. CAD:  Continue effient for DES placed2/14. Continue statin.   4. Atrial arrythmias: Sinus this am with frequent PACs. Telemetry is reviewed and over last 4 days reveals atrial tach with frequent PACs and PVCs while in sinus. Possible atrial fibrillation noted on recent Holter monitor. Continue Coreg, digoxin and Coumadin. There has been no bradycardia noted.Options for antiarrhythmic therapy would be limited. He would not be a candidate for flecainide given his history of coronary artery disease. Amiodarone would most likely need to be avoided given severity of pulmonary disease. Renal insufficiency would most likely preclude tikosyn. Will follow.   5. Hypertension: BP controlled. Continue current meds.   6. Right ventricular thrombus: Continue Coumadin   Dreydon Cardenas  11/18/20148:22 AM

## 2013-02-18 NOTE — Progress Notes (Signed)
Spoke with pt and wife at bedside concerning home health needs. Pt will continue with cardiac rehab and will not need HH at present time. Encouraged pt to continue with MD follow up.

## 2013-02-18 NOTE — Progress Notes (Signed)
Went over discharge instructions with patient. Patient verbalizes understanding and importance of follow up appointment with pulmonologist. Danny [name]

## 2013-02-18 NOTE — Discharge Summary (Signed)
Physician Discharge Summary  Patient ID: Danny [name] MRN: [id number] DOB/AGE: 04/29/1955 57 y.o.  Admit date: 02/13/2013 Discharge date: 02/18/2013  Problem List Active Problems:   COPD exacerbation   Pulmonary hypertension   Atrial fibrillation   Sinus tachycardia   Atrial tachycardia, paroxysmal  HPI: Danny [name] has multiple medical problems including Gold stage IV COPD on chronic 2 L oxygen with FEV1 of 28%. COPD is followed both Dr. Delton Coombes and Dr Elder Love. Some talk about lung trnasplant eval. Is also status bullectomy colectomy for upper lobe emphysema in the remote past. History is provided by him and his wife. The history quality is poor due to his multiple health problems.  It appears that he is had 2 admissions for pneumonia one in August 2014 the other one in September or October 2014. Unclear details. It appears that a week ago he was started on digoxin for a to fibrillation and atrial tachycardia. Subsequent to that he's had increasing fatigue, low heart rate. Is associated with increasing cough, dyspnea, increased sputum volume and also change in sputum color today 2 mild hemoptysis. He does not appear there is worsening edema which he chronically suffers from but his wife has adjusted his Lasix upwards. Is no fever or chest pain.  In the office today his pulse oximetry was 83% on 2 L nasal cannula which is far below baseline. Patient is wife asked them anxious about his health situation and is being admitted directly from the office to the hospital.  Hospital Course: SIGNIFICANT EVENTS:  11/13 Admit from pulmonary office, cardiology consulted  STUDIES:  04/24/12 Spirometry >> FEV1 0.78 (26%), FEV1% 45  01/27/13 Echo >> mild LVH, EF 40 to 45%, grade 1 diastolic dysfx, thrombus Rt ventricular apex, mod/severe RA dilation  LINES / TUBES:  PIV  CULTURES:  Blood 11/13 >> pending at dc Urine 11/13 >>neg  Pneumococcal Ag 11/13 >> negative  Legionella Ag 11/13 >>   ANTIBIOTICS:  Vancomycin 11/3 >> 11/14  Zosyn 11/13 >> 11/14  Levaquin 11/14>>18   ASSESSMENT / PLAN:  A:  Acute on chronic respiratory failure with hypoxia/hypercapnia.  Acute on chronic cor pulmonale.  AECOPD with hx of GOLD 4 COPD and bullous emphysema >> being evaluated at [facility] for transplant.  Hemoptysis >> very small amount.  Secondary pulmonary hypertension.  Lt upper lobe pulmonary nodule on CT chest 10/23/12.  P:  -wean off solumedrol as tolerated => on 40mg  Q12h => change to PO Pred 20Bid. 11-16  -changed to brovana, budesonide via NEB BID 11/14  -prn albuterol, atrovent also via Neb  -oxygen to keep SpO2 > 92%  -f/u CXR 11/15 => chr changes COPD & sl vol loss on left from prev surg  -no evidence for PNA on CXR >> change Abx to Levaquin 11/14  -continue BiPAP qhs  -ambulate and record any desaturation 11-17 , placed on 4 l [place] with ambulation with sats 92%. A:  Intermittent tachycardia with A fib noted on outpt holter monitoring.  Chronic systolic heart failure.  Hx of HTN, RV thrombus, CAD s/p stenting.   P:  -okay to continue coumadin per pharmacy  -coreg, catapres, digoxin, cozaar, effient, crestor per cardiology  -monitor on tele  -continue lasix  A:  Stage 2 CKD.    P:  -monitor renal fx, urine outpt, electrolytes  A:  Constipation.  P:  -adjust bowel regimen  Summary:  Better, needs ambulation with sats monitored.   Summary: Needs cardiac rehab as opt in [place] per Dr. Juanetta Gosling. Home O2 increased to 2l/m with rest 4l/m with activity. Changed to Brovana/pulmicort. K+ held due to 5.3 on day of DC. Restart as opt. Levaquin dc'd due to rising INR. Coumadin decreased to 2.5 mg and clinic set up for 02-20-13 to dose coumadin.     Labs at discharge Lab Results  Component Value Date   CREATININE 1.22 02/18/2013   BUN 23 02/18/2013   NA 132* 02/18/2013   K 5.3* 02/18/2013   CL 94* 02/18/2013   CO2 30 02/18/2013   Lab Results  Component  Value Date   WBC 19.2* 02/17/2013   HGB 12.4* 02/17/2013   HCT 37.5* 02/17/2013   MCV 98.7 02/17/2013   PLT 281 02/17/2013   Lab Results  Component Value Date   ALT 14 02/13/2013   AST 15 02/13/2013   ALKPHOS 67 02/13/2013   BILITOT 0.7 02/13/2013   Lab Results  Component Value Date   INR 2.44* 02/18/2013   INR 3.02* 02/17/2013   INR 2.45* 02/16/2013    Current radiology studies No results found.  Disposition:  01-Home or Self Care   Future Appointments Provider Department Dept Phone   02/20/2013 1:10 PM Jodelle Gross, NP [facility] [place] [phone]   02/25/2013 3:15 PM Julio Sicks, NP [facility] [phone]   03/05/2013 11:40 AM [facility] [facility] [place] [phone]   04/01/2013 2:00 PM Jonelle Sidle, MD [facility] [phone]       Medication List    STOP taking these medications       Fluticasone-Salmeterol 250-50 MCG/DOSE Aepb  Commonly known as:  ADVAIR     potassium chloride 10 MEQ tablet  Commonly known as:  K-DUR      TAKE these medications       albuterol 108 (90 BASE) MCG/ACT inhaler  Commonly known as:  PROVENTIL HFA;VENTOLIN HFA  Inhale 2 puffs into the lungs every 6 (six) hours as needed for wheezing.     arformoterol 15 MCG/2ML Nebu  Commonly known as:  BROVANA  Take 2 mLs (15 mcg total) by nebulization 2 (two) times daily.     budesonide 0.25 MG/2ML nebulizer solution  Commonly known as:  PULMICORT  Take 2 mLs (0.25 mg total) by nebulization 2 (two) times daily.     carvedilol 25 MG tablet  Commonly known as:  COREG  Take 1 tablet (25 mg total) by mouth 2 (two) times daily with a meal.     cloNIDine 0.1 MG tablet  Commonly known as:  CATAPRES  Take 1 tablet (0.1 mg total) by mouth 3 (three) times daily.     digoxin 0.125 MG tablet  Commonly known as:  LANOXIN  Take 1 tablet (0.125 mg total) by mouth daily.     furosemide 40 MG tablet  Commonly known as:   LASIX  Take 40 mg by mouth daily. If fluid increases patient can take up to 80 mg daily for up to 3 days     ipratropium-albuterol 0.5-2.5 (3) MG/3ML Soln  Commonly known as:  DUONEB  Take 3 mLs by nebulization every 6 (six) hours as needed.     losartan 100 MG tablet  Commonly known as:  COZAAR  Take 100 mg by mouth every morning.     omeprazole 20 MG capsule  Commonly known as:  PRILOSEC  Take 20 mg by mouth 2 (two) times daily.     prasugrel 10 MG Tabs tablet  Commonly known as:  EFFIENT  Take 10 mg by mouth daily.     predniSONE 10 MG tablet  Commonly known as:  DELTASONE  Take 4 tabs  daily with food x 4 days, then 3 tabs daily x 4 days, then 2 tabs daily x 4 days, then 1 tab daily x4 days then stop. #40     ramipril 5 MG capsule  Commonly known as:  ALTACE  Take 1 capsule (5 mg total) by mouth daily.     rosuvastatin 10 MG tablet  Commonly known as:  CRESTOR  Take 1 tablet (10 mg total) by mouth daily at 6 PM.     warfarin 2.5 MG tablet  Commonly known as:  COUMADIN  Take 1 tablet (2.5 mg total) by mouth daily.           Follow-up Information   Follow up with [facility] [place] On 02/20/2013. (coumadin clinic 1:00 pm)    Specialty:  Cardiology   Contact information:   [address] [place] [address] [phone]      Follow up with PARRETT,TAMMY, NP On 02/25/2013. (3:15 pm)    Specialty:  Nurse Practitioner   Contact information:   [address] [place] [address] [phone]       Schedule an appointment as soon as possible for a visit with HAWKINS,EDWARD L, MD. (In near future)    Specialty:  Pulmonary Disease   Contact information:   [address] [place] [address] [phone]        Discharged Condition: fair  Time spent on discharge greater than 90 minutes.  Vital signs at Discharge. Temp:  [97.8 F (36.6 C)-98.2 F (36.8 C)] 98.2 F (36.8 C) (11/18 0552) Pulse Rate:  [59-90] 74 (11/18  1027) Resp:  [20] 20 (11/18 0552) BP: (133-158)/(81-97) 147/91 mmHg (11/18 0803) SpO2:  [90 %-99 %] 95 % (11/18 0807) Weight:  [164 lb 14.5 oz (74.8 kg)] 164 lb 14.5 oz (74.8 kg) (11/18 0553) Office follow up Special Information or instructions. Potassium supplements on hold for elevated K+. Consider restart.  Needs follow up with Dr. Delton Coombes. Dr. Juanetta Gosling to set up cardiac rehab in [place]. Signed: Brett Canales Minor ACNP [facility] P[facility] Pager [phone] till 3 pm If no answer page [phone] 02/18/2013, 1:01 PM     Agree with above  Billy Fischer, MD ; [facility] service Mobile [phone].  After 5:30 PM or weekends, call [phone]

## 2013-02-18 NOTE — Progress Notes (Signed)
ANTICOAGULATION CONSULT NOTE - Follow Up Consult  Pharmacy Consult for Warfarin Indication: RV mural thrombus  Allergies  Allergen Reactions  . Lipitor [Atorvastatin] Other (See Comments)    Muscle spasms     Patient Measurements: Height: 5\' 7"  (170.2 cm) Weight: 164 lb 14.5 oz (74.8 kg) IBW/kg (Calculated) : 66.1  Vital Signs: Temp: 98.2 F (36.8 C) (11/18 0552) Temp src: Oral (11/18 0552) BP: 147/91 mmHg (11/18 0803) Pulse Rate: 65 (11/18 0803)  Labs:  Recent Labs  02/16/13 0533 02/17/13 0459 02/18/13 0435  HGB  --  12.4*  --   HCT  --  37.5*  --   PLT  --  281  --   LABPROT 25.8* 30.2* 25.7*  INR 2.45* 3.02* 2.44*  CREATININE  --  1.26 1.22    Estimated Creatinine Clearance: 64 ml/min (by C-G formula based on Cr of 1.22).   Medications:  Scheduled:  . arformoterol  15 mcg Nebulization BID  . budesonide (PULMICORT) nebulizer solution  0.25 mg Nebulization BID  . carvedilol  25 mg Oral BID WC  . cloNIDine  0.1 mg Oral TID  . digoxin  0.125 mg Oral Daily  . docusate sodium  100 mg Oral BID  . furosemide  40 mg Oral Daily  . levofloxacin  500 mg Oral Daily  . losartan  100 mg Oral Daily  . pantoprazole  40 mg Oral Q1200  . prasugrel  10 mg Oral Daily  . predniSONE  20 mg Oral BID WC  . rosuvastatin  10 mg Oral q1800  . Warfarin - Pharmacist Dosing Inpatient   Does not apply q1800   Inpatient warfarin doses 11/13-11/17: 3.75, 5, 3.5, 3, 0.5mg   Assessment: 57 yo male on chronic warfarin for history RV mural thrombus. Order to continue per Pharmacy protocol on admission.  Home dose is 3.75mg  daily  INR therapeutic (2.44), had risen quickly - likely 2/2 interaction with levaquin started 11/14  CBC stable. No further epistaxis reported  Patient also takes prasugrel  - monitor closely for bleeding  Goal of Therapy:  INR 2-3   Plan:   Warfarin 2.5 mg today at 1800 x1  Daily PT/INR   Loralee Pacas, PharmD, BCPS Pager: [phone]  02/18/2013  9:47 AM

## 2013-02-18 NOTE — Addendum Note (Signed)
Encounter addended by: Angelica Pou, RN on: 02/18/2013 12:28 PM<BR>     Documentation filed: Clinical Notes

## 2013-02-18 NOTE — Progress Notes (Signed)
Pulmonary Rehabilitation Program Outcomes Report   Orientation:  02/272014 Graduate Date:  10/02/2012 Discharge Date:  10/02/2012 # of sessions completed: 24 DX: COPD/ CAD  Pulmonologist: Delton Coombes Family MD:  Maximino Greenland Class Time:  13:00  A.  Exercise Program:  Tolerates exercise @ 1.77 METS for 15 minutes, Walk Test Results:  Pre: Pre Walk Test Rest data: HR 102, BP 102/70, o2 91%, RPE 7 and RPD 9, 6 min HR 106, BP 120/90,,O2 66% RPE 11and RPD 11, Post 104HR, BP 100/60, O2 95%, RPE 7 and RPD 9 walked 1200 sq. Ft at 2.3 mph at 3.3 METS . No post test done upon exit. and Discharged to home exercise program.  Anticipated compliance:  excellent  B.  Mental Health:  Good mental attitude and Quality of Life (QOL)  improvements:  Overall  24.51 %, Health/Functioning 20.63 %, Socioeconomics 28.29 %, Psych/Spiritual 30.00 %, Family 24.00 %    C.  Education/Instruction/Skills  Accurately checks own pulse.  Rest:  89  Exercise:  111, Knows THR for exercise, Uses Perceived Exertion Scale and/or Dyspnea Scale and Attended 13 education classes  Uses Perceived Exertion Scale and/or Dyspnea Scale  D.  Nutrition/Weight Control/Body Composition:  Adherence to prescribed nutrition program: good    E.  Blood Lipids    No results found for this basename: CHOL, HDL, LDLCALC, LDLDIRECT, TRIG, CHOLHDL    F.  Lifestyle Changes:  Making positive lifestyle changes  G.  Symptoms noted with exercise:  Asymptomatic  Report Completed By:  Lelon Huh. Mariabelen Pressly RN   Comments:   This is patients Graduation report.He has done well in rehab. His resting HR was 89 and resting BP was 140/80. His peak HR was 111 and peak BP 160/92. He achieved a peak METS of 1.77. A call at 1 month, 6 months and 1 year will be placed to patient.

## 2013-02-19 LAB — CULTURE, BLOOD (ROUTINE X 2): Culture: NO GROWTH

## 2013-02-20 ENCOUNTER — Ambulatory Visit (INDEPENDENT_AMBULATORY_CARE_PROVIDER_SITE_OTHER): Payer: Managed Care, Other (non HMO) | Admitting: *Deleted

## 2013-02-20 ENCOUNTER — Ambulatory Visit (INDEPENDENT_AMBULATORY_CARE_PROVIDER_SITE_OTHER): Payer: Managed Care, Other (non HMO) | Admitting: Adult Health

## 2013-02-20 ENCOUNTER — Other Ambulatory Visit (HOSPITAL_COMMUNITY): Payer: Self-pay

## 2013-02-20 ENCOUNTER — Encounter: Payer: Self-pay | Admitting: Adult Health

## 2013-02-20 VITALS — BP 100/59 | HR 89 | Ht 67.0 in | Wt 168.0 lb

## 2013-02-20 DIAGNOSIS — I513 Intracardiac thrombosis, not elsewhere classified: Secondary | ICD-10-CM

## 2013-02-20 DIAGNOSIS — J441 Chronic obstructive pulmonary disease with (acute) exacerbation: Secondary | ICD-10-CM

## 2013-02-20 DIAGNOSIS — I2510 Atherosclerotic heart disease of native coronary artery without angina pectoris: Secondary | ICD-10-CM

## 2013-02-20 DIAGNOSIS — I219 Acute myocardial infarction, unspecified: Secondary | ICD-10-CM

## 2013-02-20 DIAGNOSIS — I4891 Unspecified atrial fibrillation: Secondary | ICD-10-CM

## 2013-02-20 DIAGNOSIS — Z7901 Long term (current) use of anticoagulants: Secondary | ICD-10-CM

## 2013-02-20 LAB — POCT INR: INR: 2.4

## 2013-02-20 NOTE — Assessment & Plan Note (Signed)
Heart rate is well controlled currently. He is tolerating his medications well without increased palpitations. He is to have him coumadin dose and INR evaluated today

## 2013-02-20 NOTE — Progress Notes (Signed)
Name: Danny [name]    DOB: [birth year]  Age: 57 y.o.  MR#: [id number]       PCP:  Isabella Stalling, MD      Insurance: Payor: Monia Pouch / Plan: Derrek Gu / Product Type: *No Product type* /   CC:    Chief Complaint  Patient presents with  . Palpitations  . Cardiomyopathy  . Coronary Artery Disease    VS Filed Vitals:   02/20/13 1305  BP: 100/59  Pulse: 89  Height: 5\' 7"  (1.702 m)  Weight: 168 lb (76.204 kg)  SpO2: 90%    Weights Current Weight  02/20/13 168 lb (76.204 kg)  02/18/13 164 lb 14.5 oz (74.8 kg)  02/13/13 166 lb 12.8 oz (75.66 kg)    Blood Pressure  BP Readings from Last 3 Encounters:  02/20/13 100/59  02/18/13 120/88  02/13/13 102/64     Admit date:  (Not on file) Last encounter with RMR:  02/05/2013   Allergy Lipitor  Current Outpatient Prescriptions  Medication Sig Dispense Refill  . albuterol (PROVENTIL HFA;VENTOLIN HFA) 108 (90 BASE) MCG/ACT inhaler Inhale 2 puffs into the lungs every 6 (six) hours as needed for wheezing.      Marland Kitchen arformoterol (BROVANA) 15 MCG/2ML NEBU Take 2 mLs (15 mcg total) by nebulization 2 (two) times daily.  120 mL  6  . budesonide (PULMICORT) 0.25 MG/2ML nebulizer solution Take 2 mLs (0.25 mg total) by nebulization 2 (two) times daily.  60 mL  12  . carvedilol (COREG) 25 MG tablet Take 1 tablet (25 mg total) by mouth 2 (two) times daily with a meal.  60 tablet  1  . cloNIDine (CATAPRES) 0.1 MG tablet Take 1 tablet (0.1 mg total) by mouth 3 (three) times daily.  60 tablet  11  . digoxin (LANOXIN) 0.125 MG tablet Take 1 tablet (0.125 mg total) by mouth daily.  90 tablet  3  . furosemide (LASIX) 40 MG tablet Take 40 mg by mouth daily. If fluid increases patient can take up to 80 mg daily for up to 3 days      . ipratropium-albuterol (DUONEB) 0.5-2.5 (3) MG/3ML SOLN Take 3 mLs by nebulization every 6 (six) hours as needed.  360 mL  6  . losartan (COZAAR) 100 MG tablet Take 100 mg by mouth every morning.       Marland Kitchen omeprazole  (PRILOSEC) 20 MG capsule Take 20 mg by mouth 2 (two) times daily.       . prasugrel (EFFIENT) 10 MG TABS Take 10 mg by mouth daily.      . predniSONE (DELTASONE) 10 MG tablet Take 4 tabs  daily with food x 4 days, then 3 tabs daily x 4 days, then 2 tabs daily x 4 days, then 1 tab daily x4 days then stop. #40  40 tablet  0  . ramipril (ALTACE) 5 MG capsule Take 1 capsule (5 mg total) by mouth daily.  30 capsule  1  . rosuvastatin (CRESTOR) 10 MG tablet Take 1 tablet (10 mg total) by mouth daily at 6 PM.  30 tablet  1  . warfarin (COUMADIN) 2.5 MG tablet Take 1 tablet (2.5 mg total) by mouth daily.       No current facility-administered medications for this visit.    Discontinued Meds:   There are no discontinued medications.  Patient Active Problem List   Diagnosis Date Noted  . Atrial tachycardia, paroxysmal 02/16/2013  . Sinus tachycardia 02/15/2013  . Pulmonary hypertension 02/14/2013  .  Atrial fibrillation 02/14/2013  . COPD exacerbation 02/13/2013  . Palpitations 01/23/2013  . Epistaxis 12/23/2012  . COPD (chronic obstructive pulmonary disease) 11/28/2012  . Secondary cardiomyopathy, unspecified 11/28/2012  . Essential hypertension, benign 10/23/2012  . Long term (current) use of anticoagulants 10/21/2012  . RV (right ventricular) mural thrombus 10/18/2012  . Coronary atherosclerosis of native coronary artery 05/10/2012  . GERD 05/14/2007    LABS    Component Value Date/Time   NA 132* 02/18/2013 0435   NA 139 02/17/2013 0459   NA 141 02/15/2013 0524   K 5.3* 02/18/2013 0435   K 4.4 02/17/2013 0459   K 4.4 02/15/2013 0524   CL 94* 02/18/2013 0435   CL 98 02/17/2013 0459   CL 101 02/15/2013 0524   CO2 30 02/18/2013 0435   CO2 33* 02/17/2013 0459   CO2 32 02/15/2013 0524   GLUCOSE 193* 02/18/2013 0435   GLUCOSE 172* 02/17/2013 0459   GLUCOSE 186* 02/15/2013 0524   BUN 23 02/18/2013 0435   BUN 21 02/17/2013 0459   BUN 24* 02/15/2013 0524   CREATININE 1.22  02/18/2013 0435   CREATININE 1.26 02/17/2013 0459   CREATININE 1.53* 02/15/2013 0524   CREATININE 1.35 10/14/2012 1240   CREATININE 1.31 10/02/2012 1015   CALCIUM 9.4 02/18/2013 0435   CALCIUM 9.6 02/17/2013 0459   CALCIUM 9.6 02/15/2013 0524   GFRNONAA 65* 02/18/2013 0435   GFRNONAA 63* 02/17/2013 0459   GFRNONAA 50* 02/15/2013 0524   GFRAA 75* 02/18/2013 0435   GFRAA 73* 02/17/2013 0459   GFRAA 57* 02/15/2013 0524   CMP     Component Value Date/Time   NA 132* 02/18/2013 0435   K 5.3* 02/18/2013 0435   CL 94* 02/18/2013 0435   CO2 30 02/18/2013 0435   GLUCOSE 193* 02/18/2013 0435   BUN 23 02/18/2013 0435   CREATININE 1.22 02/18/2013 0435   CREATININE 1.35 10/14/2012 1240   CALCIUM 9.4 02/18/2013 0435   PROT 7.2 02/13/2013 1334   ALBUMIN 3.6 02/13/2013 1334   AST 15 02/13/2013 1334   ALT 14 02/13/2013 1334   ALKPHOS 67 02/13/2013 1334   BILITOT 0.7 02/13/2013 1334   GFRNONAA 65* 02/18/2013 0435   GFRAA 75* 02/18/2013 0435       Component Value Date/Time   WBC 19.2* 02/17/2013 0459   WBC 21.1* 02/15/2013 0524   WBC 9.0 02/13/2013 1334   HGB 12.4* 02/17/2013 0459   HGB 11.4* 02/15/2013 0524   HGB 12.7* 02/13/2013 1334   HCT 37.5* 02/17/2013 0459   HCT 34.9* 02/15/2013 0524   HCT 38.3* 02/13/2013 1334   MCV 98.7 02/17/2013 0459   MCV 100.3* 02/15/2013 0524   MCV 99.5 02/13/2013 1334    Lipid Panel  No results found for this basename: chol, trig, hdl, cholhdl, vldl, ldlcalc    ABG    Component Value Date/Time   PHART 7.443 02/13/2013 1455   PCO2ART 45.0 02/13/2013 1455   PO2ART 54.8* 02/13/2013 1455   HCO3 30.3* 02/13/2013 1455   TCO2 27.1 02/13/2013 1455   O2SAT 86.4 02/13/2013 1455     No results found for this basename: TSH   BNP (last 3 results)  Recent Labs  10/02/12 1015 10/23/12 1055 02/13/13 1332  PROBNP 2474.00* 2088.0* 2361.0*   Cardiac Panel (last 3 results) No results found for this basename: CKTOTAL, CKMB, TROPONINI, RELINDX,  in the  last 72 hours  Iron/TIBC/Ferritin No results found for this basename: iron, tibc, ferritin     EKG Orders placed  during the hospital encounter of 02/13/13  . EKG 12-LEAD  . EKG 12-LEAD  . EKG 12-LEAD  . EKG 12-LEAD  . EKG     Prior Assessment and Plan Problem List as of 02/20/2013   GERD   Last Assessment & Plan   10/14/2012 Office Visit Written 10/14/2012  1:12 PM by Jodelle Gross, NP     Recently started on PPI, with some improvement in symptoms of chest pressure.    Coronary atherosclerosis of native coronary artery   Last Assessment & Plan   01/23/2013 Office Visit Written 01/23/2013 11:27 AM by Jonelle Sidle, MD     Status post DES to the obtuse marginal in an outside facility in February of this year. Continue Effient. Aspirin was stopped since he is concurrently on Coumadin.    RV (right ventricular) mural thrombus   Last Assessment & Plan   01/23/2013 Office Visit Written 01/23/2013 11:26 AM by Jonelle Sidle, MD     Associated with severe RV dysfunction. Plan is to continue Coumadin. Followup echocardiogram to be obtained as well.    Long term (current) use of anticoagulants   Essential hypertension, benign   Last Assessment & Plan   11/28/2012 Office Visit Written 11/28/2012 12:00 PM by Jonelle Sidle, MD     Blood pressure is well controlled today.    COPD (chronic obstructive pulmonary disease)   Last Assessment & Plan   02/04/2013 Office Visit Written 02/04/2013  4:29 PM by Leslye Peer, MD     Continue current regimen iincluding O2 and nocturnal Bipap We will repeat Ct scan in 10/2013    Secondary cardiomyopathy, unspecified   Last Assessment & Plan   01/23/2013 Office Visit Written 01/23/2013 11:26 AM by Jonelle Sidle, MD     LVEF 40-45%, weight stable. He continues on Coreg, Lasix, Altace, potassium supplements.    Epistaxis   Palpitations   Last Assessment & Plan   01/23/2013 Office Visit Written 01/23/2013 11:26 AM by Jonelle Sidle, MD     Described recently. No syncope. He did have frequent PVCs and PACs on recent ECGs with ER visit. States that they notice fluctuation in heart rate when using his pulse oximeter home. A 48 hour Holter monitor will be obtained.    COPD exacerbation   Pulmonary hypertension   Atrial fibrillation   Sinus tachycardia   Atrial tachycardia, paroxysmal       Imaging: Dg Chest Port 1 View  02/15/2013   CLINICAL DATA:  Followup COPD.  Shortness of Breath.  EXAM: PORTABLE CHEST - 1 VIEW  COMPARISON:  02/13/2013  FINDINGS: Areas of lung scarring and emphysema are stable. No convincing infiltrate and no pulmonary edema. No pneumothorax or pleural effusion.  Cardiopericardial silhouette is normal in size. Mediastinum is normal in contour.  IMPRESSION: 1. No acute findings in the lungs.  No change from the prior exam.   Electronically Signed   By: Amie Portland M.D.   On: 02/15/2013 07:39   Dg Chest Port 1 View  02/13/2013   CLINICAL DATA:  Shortness of breath  EXAM: PORTABLE CHEST - 1 VIEW  COMPARISON:  01/21/2013  FINDINGS: Cardiac shadow is stable. Chronic changes are noted in the lungs bilaterally. Chronic changes in the right rib cage are noted as well. The changes in the right lateral lung base have improved in the interval. No new focal infiltrate is seen. Scarring is noted in the left base.  IMPRESSION: Improved aeration in  the right lung base. Chronic changes are identified.   Electronically Signed   By: Alcide Clever M.D.   On: 02/13/2013 13:32   Dg Chest Portable 1 View  01/21/2013   CLINICAL DATA:  Tachycardia. Short of breath.  EXAM: PORTABLE CHEST - 1 VIEW  COMPARISON:  10/23/2012.  FINDINGS: Chronic bilateral pulmonary parenchymal scarring is present with elevation of the left hemidiaphragm. Rib deformity is present on the right, which is chronic. Monitoring leads project over the chest. Cardiopericardial silhouette is mildly enlarged for projection. Mediastinal contours appear  similar to the prior exam.  Compared to prior exam, there is a new small focus of airspace disease near the right costophrenic angle with a probable small right pleural effusion suspicious for pneumonia. Followup to ensure radiographic clearing and exclude an underlying lesion is recommended. Typically clearing will be observed at 8 weeks.  IMPRESSION: Small focus of airspace density at the right costophrenic angle is new compared to prior exam, suspicious for pneumonia. Symmetric/atypical pulmonary edema considered less likely.   Electronically Signed   By: Andreas Newport M.D.   On: 01/21/2013 14:11

## 2013-02-20 NOTE — Assessment & Plan Note (Signed)
No evidence of recurrent chest pain. He is compliant with medications. He will continue on Effient. Not on ASA due to use of coumadin. I will decrease his ramipril to 2.5 mg from 5 mg due to hypotension.

## 2013-02-20 NOTE — Progress Notes (Signed)
HPI: [name] is a 57 year old patient of Dr. Diona Browner we are following for ongoing assessment and management of CAD, cardiomyopathy, with history of palpitations and COPD. The patient was diagnosed with artery thrombus in September of 2014, and also had a drug-eluting stent placement to OM.Marland Kitchen The patient was placed on Coumadin and Effient. He is followed in [place] office for Coumadin dosing. He is being followed for COPD by Dr. Juanetta Gosling. He was last seen by Dr. Diona Browner in October of 2014. The patient was continued on his current medication regimen. A followup echocardiogram was ordered. Also a 48 hour Holter monitor was placed secondary to complaints of palpitations.     Echocardiogram completed on 01/27/2013 demonstrated LVEF of 40-45%, with LVH, mild to moderate global hypokinesis. A grade 1 diastolic dysfunction. RV continued to demonstrate a homogeneous mass in the right ventricular apex. Cavity size was severely dilated. Systolic function was severely reduced. Results found to have pulmonary hypertension.    On 02/06/2013 due to abnormal Holter monitor, the patient was started on digoxin 0.125 mg daily. He also had some complaints of nosebleeds. He was suggested to be placed on an allergy medication.   Unfortunately, the patient was admitted to the hospital again on 02/13/2013 for COPD exacerbation. He was started on steroids and breathing treatments and antibiotics. He was continued on his cardiac medications. He is here for followup.    He comes today on permanent O2 at 2-4 liters. He is without chest pain. He is less dyspneac with O2 support. He is to be established with cardiac rehab.  Allergies  Allergen Reactions  . Lipitor [Atorvastatin] Other (See Comments)    Muscle spasms     Current Outpatient Prescriptions  Medication Sig Dispense Refill  . albuterol (PROVENTIL HFA;VENTOLIN HFA) 108 (90 BASE) MCG/ACT inhaler Inhale 2 puffs into the lungs every 6 (six) hours as needed for  wheezing.      Marland Kitchen arformoterol (BROVANA) 15 MCG/2ML NEBU Take 2 mLs (15 mcg total) by nebulization 2 (two) times daily.  120 mL  6  . budesonide (PULMICORT) 0.25 MG/2ML nebulizer solution Take 2 mLs (0.25 mg total) by nebulization 2 (two) times daily.  60 mL  12  . carvedilol (COREG) 25 MG tablet Take 1 tablet (25 mg total) by mouth 2 (two) times daily with a meal.  60 tablet  1  . cloNIDine (CATAPRES) 0.1 MG tablet Take 1 tablet (0.1 mg total) by mouth 3 (three) times daily.  60 tablet  11  . digoxin (LANOXIN) 0.125 MG tablet Take 1 tablet (0.125 mg total) by mouth daily.  90 tablet  3  . furosemide (LASIX) 40 MG tablet Take 40 mg by mouth daily. If fluid increases patient can take up to 80 mg daily for up to 3 days      . ipratropium-albuterol (DUONEB) 0.5-2.5 (3) MG/3ML SOLN Take 3 mLs by nebulization every 6 (six) hours as needed.  360 mL  6  . losartan (COZAAR) 100 MG tablet Take 100 mg by mouth every morning.       Marland Kitchen omeprazole (PRILOSEC) 20 MG capsule Take 20 mg by mouth 2 (two) times daily.       . prasugrel (EFFIENT) 10 MG TABS Take 10 mg by mouth daily.      . predniSONE (DELTASONE) 10 MG tablet Take 4 tabs  daily with food x 4 days, then 3 tabs daily x 4 days, then 2 tabs daily x 4 days, then 1 tab daily  x4 days then stop. #40  40 tablet  0  . ramipril (ALTACE) 5 MG capsule Take 1 capsule (5 mg total) by mouth daily.  30 capsule  1  . rosuvastatin (CRESTOR) 10 MG tablet Take 1 tablet (10 mg total) by mouth daily at 6 PM.  30 tablet  1  . warfarin (COUMADIN) 2.5 MG tablet Take 1 tablet (2.5 mg total) by mouth daily.       No current facility-administered medications for this visit.    Past Medical History  Diagnosis Date  . GERD (gastroesophageal reflux disease)   . COPD (chronic obstructive pulmonary disease)   . Myocardial infarction 05/02/12  . Chronic systolic heart failure   . Essential hypertension, benign   . Cardiomyopathy     LVEF 40-45%  . Right ventricular mural  thrombus     On Coumadin  . Cor pulmonale     Severe RV dysfunction  . Coronary atherosclerosis of native coronary artery     DES to OM [facility]  . Atrial fibrillation   . Spontaneous pneumothorax 2002    bilateral    Past Surgical History  Procedure Laterality Date  . 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 Coronary angioplasty with stent placement  04/2012    in [place], [place]    ROS: Review of systems complete and found to be negative unless listed above PHYSICAL EXAM BP 100/59  Pulse 89  Ht 5\' 7"  (1.702 m)  Wt 168 lb (76.204 kg)  BMI 26.31 kg/m2  SpO2 90%  General: Well developed, well nourished, in no acute distress, wearing O2.  Head: Eyes PERRLA, No xanthomas.   Normal cephalic and atramatic  Lungs: Clear bilaterally to auscultation and percussion. Heart: HRRR S1 S2, without MRG.  Pulses are 2+ & equal.            No carotid bruit. No JVD.  No abdominal bruits. No femoral bruits. Abdomen: Bowel sounds are positive, abdomen soft and non-tender without masses or                  Hernia's noted. Msk:  Back normal, normal gait. Normal strength and tone for age. Extremities: No clubbing, cyanosis or edema.  DP +1 Neuro: Alert and oriented X 3. Psych:  Good affect, responds appropriately   ASSESSMENT AND PLAN

## 2013-02-20 NOTE — Patient Instructions (Addendum)
Your physician recommends that you schedule a follow-up appointment in: ONE MONTH  Your physician has recommended you make the following change in your medication:   1) DECREASE RAMIPRIL TO 2.5MG  ONCE DAILY  Your physician recommends THAT YOU BE REFERRED TO CARDIAC REHAB, A staff member from our office will alert you the with appointment date and time, once available

## 2013-02-20 NOTE — Assessment & Plan Note (Signed)
Continue coumadin as directed  

## 2013-02-20 NOTE — Assessment & Plan Note (Signed)
Low today.  Checked orthostatic's and not found to be so, but BP did drop from 102 to 96 systolic. Will have him return to be seen again in one month.

## 2013-02-21 ENCOUNTER — Telehealth: Payer: Self-pay

## 2013-02-21 ENCOUNTER — Telehealth: Payer: Self-pay | Admitting: Adult Health

## 2013-02-21 MED ORDER — RAMIPRIL 2.5 MG PO CAPS: ORAL_CAPSULE | ORAL | Status: DC

## 2013-02-21 NOTE — Telephone Encounter (Signed)
States that Ramipril was reduced and they can not be broke due to them being capsules. Please advise. / tgs carl

## 2013-02-21 NOTE — Telephone Encounter (Signed)
Pt notified that new rx called for altace 2.5 mg

## 2013-02-24 ENCOUNTER — Ambulatory Visit: Payer: Managed Care, Other (non HMO) | Admitting: Cardiology

## 2013-02-25 ENCOUNTER — Ambulatory Visit (INDEPENDENT_AMBULATORY_CARE_PROVIDER_SITE_OTHER): Payer: Managed Care, Other (non HMO) | Admitting: Adult Health

## 2013-02-25 ENCOUNTER — Encounter: Payer: Self-pay | Admitting: Adult Health

## 2013-02-25 VITALS — BP 106/68 | HR 70 | Temp 97.1°F | Ht 67.0 in | Wt 170.6 lb

## 2013-02-25 DIAGNOSIS — J441 Chronic obstructive pulmonary disease with (acute) exacerbation: Secondary | ICD-10-CM

## 2013-02-25 MED ORDER — CLOTRIMAZOLE 10 MG MT TROC: 10.0000 mg | Freq: Every day | OROMUCOSAL | Status: DC

## 2013-02-25 NOTE — Patient Instructions (Addendum)
Mycelex troche fives times daily for 1 week.  Taper prednisone as directed.  Continue on Budesonide and Brovana Nebs Twice daily  . Rinse after use.  Wear your oxygen at all times Wear your BiPAP every night  Follow with Dr Delton Coombes in 4 weeks or sooner if you have any problems. Please contact office for sooner follow up if symptoms do not improve or worsen or seek emergency care

## 2013-02-26 NOTE — Progress Notes (Signed)
Subjective:    Patient ID: Danny [name], male    DOB: 12/10/1955, 57 y.o.   MRN: [id number] HPI 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    01/15/2008--Complains of 2 weeks of cough, congestion, blood tinged initially. Went to ER CXR and CT chest showed 4 R broken ribs, negative for PE. bilateral scarring, and questionable opacities. Started on Avelox for 4 days.(per pt ). Some better but still has coughing fits with thick mucus. and DOE, no energy. Pain in ribs with coughng. finished avelox 2 days. ago.   02/04/08 - returns today for f/u, has completed avelox. CP is better. Breathing is better. Cough and hemoptysis have resolved. Seldom uses ProAir.   ROV 06/01/09 -- Returns for f/u, last seen 2009. His breathing has been stable. Tells me he ran out of Advair about 2 weeks ago, noticed that his SABA use increased some. Has had some anxiety at night about not getting his meds but breathing ok. No exacerbations since last visit. No smoking. No cough or hemoptysis.   ROV 09/05/10 -- Hx COPD, emphysema and s/p bullectomies. Follows up for annual check. No flares since last visit. Uses Advair bid, ProAir prn. He believes he would miss the Advair if stopped. Uses ProAir once every several days. Occas wheze, no real cough. Occas gray sputum. No CP. Needs his meds refilled.   ROV 02/07/11 -- COPD, bullous emphysema. Presents today c/o more congestion, thick sputum, over about 5 months. He has been more active, and wonders if this is a cause. The mucous if thick yellow. Still able to work. Denies much dyspnea. No exacerbations. Uses his SABA a couple times a week.   ROV 10/23/11 -- COPD, bullous emphysema. He tried mucinex since last time. He has had some LE/toe neuropathic changes, ? Some rash on fingers. He stopped the mucinex and the sx are better. Remains on Advair. Uses albuterol rarely.  Coughs but not every day. Some DOE,  happens almost every day.   ROV 04/24/12 -- COPD, bullous emphysema s/p bullectomies. Returns for f/u.  He had a recent URI that caused probable AE, was rx with abx, also started on a trial of Spiriva x 1 week. He was already on Advair. The URI sx are better. Uses SABA typically 1-2 x a day. He is trying to get back to exercising.   ROV 05/10/12 -- COPD, bullous emphysema s/p bullectomies. Was recently evaluated w cardiac cath at [facility], had NSTEMI and cath >> R dominant system, stent placed in the circumflex. LVEDP was normal, RV was dilated. He is improved, was discharged on O2. His Spiriva was stopped and he was changed to DuoNebs during that hospitalization. His Advair was continued.  Feels drained but stable  ROV 06/25/12 -- COPD, bullous emphysema s/p bullectomies. CAD w hx NSTEMI. Doing cardiopulm rehab.  He has seen Dr Eden Emms to establish in [place]. He remains on Advair + Duonebs. He didn't tolerate spiriva. 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 He is compliant with his O2.   ROV 09/26/12 -- COPD, bullous emphysema s/p bullectomies. CAD w hx NSTEMI, secondary PAH. He reports today that he has had more dyspnea, seems to be associated with some epigastric, lower chest fullness or pain. He reports desats with O2 at 3L/min when exercising. He is on prednisone 5, started by Dr Janna Arch,  that he is using prn. He feels that it is helping with his pain.   ROV 02/04/13 -- COPD, bullous emphysema s/p bullectomies. CAD w hx NSTEMI, secondary PAH. He has a CM + RV dysfunction, mural thrombus on coumadin. On presentation today he is hypoxic. He describes increase SOB and cough over the last few months. He denies any acute exacerbations he remains on Advair, duonebs 2-3x a day. He has prednisone that is available to use prn for pain - not currently on any. He wears BiPAP every night. He had a CT scan in 10/23/12 for dyspnea and CP.   02/25/13  Post Hospital follow up  Pt returns for a post hospital follow up .  Reports is doing "much better". Still has some cough and congestion.  Admitted 02/13/2013 for COPD exacerbation. He was started on steroids and breathing treatments and antibiotics.  At discharge , Home O2 increased to 2l/m with rest 4l/m with activity.  Advair was changed to Brovana/pulmicort. Remains on Coreg 25mg  Twice daily   Remains on ACE and ARB . Pt denies chest pain, orthopnea, edema or fever.  Discharged on prednisone taper-has few days left. Wears BIPAP everynight.     Constitutional:   No  weight loss, night sweats,  Fevers, chills, + fatigue, or  lassitude.  HEENT:   No headaches,  Difficulty swallowing,  Tooth/dental problems, or  Sore throat,                No sneezing, itching, ear ache, + nasal congestion, post nasal drip,   CV:  No chest pain,  Orthopnea, PND, swelling in lower extremities, anasarca, dizziness, palpitations, syncope.   GI  No heartburn, indigestion, abdominal pain, nausea, vomiting, diarrhea, change in bowel habits, loss of appetite, bloody stools.   Resp:      No chest wall deformity  Skin: no rash or lesions.  GU: no dysuria, change in color of urine, no urgency or frequency.  No flank pain, no hematuria   MS:  No joint pain or swelling.  No decreased range of motion.  No back pain.  Psych:  No change in mood or affect. No depression or anxiety.  No memory loss.        Objective:   Physical Exam   Gen: Pleasant, in no distress,  normal affect  ENT: no postnasal drip, scattered white patches along posterior pharynx   Neck: No JVD, no TMG, no carotid bruits  Lungs: No use of accessory muscles, no dullness to percussion, clear without rales or rhonchi  Cardiovascular: RRR, heart sounds normal, no murmur or gallops, trace edema > much improved  Musculoskeletal: No deformities, no cyanosis or clubbing  Neuro: alert, non focal  Skin: Warm, no lesions or  rashes   10/23/12 --  Comparison: Chest radiograph, 10/23/2012. Chest CT, 08/04/2008.  Findings: There is no evidence of a pulmonary embolism.  The heart is enlarged, but stable. There is a short left coronary  artery stents. The no mediastinal or hilar masses are appreciated.  Fascia on the right subcarinal lymph node is mildly enlarged  measuring 14 mm in short axis. There are prominent hilar lymph  nodes bilaterally are not well defined.  There are changes of emphysema and lung scarring. There is a new 8  mm irregular focal opacity in the left upper lobe that may reflect  additional scarring. Neoplastic disease is possible. There is  some ground-glass type opacity in the posterior lateral right lower  lobe which was not  present previously. This is somewhat  nonspecific. It could reflect an alveolitis. No other evidence of  infection/inflammation. No other change from the prior CT within  the lungs.  There are multiple old rib fractures on the right.  Limited evaluation of the upper abdomen is unremarkable.  There are minor degenerative changes of the thoracic spine. No  osteoblastic or osteolytic lesions.  IMPRESSION:  No evidence of a pulmonary embolism.  Significant emphysema and areas of lung scarring.  Mild ground-glass opacity in the right lower lobe, new from the  prior study. Consider an acute inflammatory or infectious  alveolitis in the proper clinical setting.  8 mm focal opacity in the left upper lobe is new from prior study.  02/14/13 CXR Areas of lung scarring and emphysema are stable.    Assessment & Plan:

## 2013-03-03 NOTE — Assessment & Plan Note (Signed)
Frequent exacerbation with recent hospitalizations.  Would consider avoiding ACE inhibitors if possible.  ? On both ACE and ARB , followed by PCP /Cardiology for HTN/CM /CAD  Also on non selective beta blocker ? Contributing to more symptoms of dyspnea as well. ? Consider changing to Bystolic if possible-leave to cardiology  Has oral candidiasis -tx with mycelex   Plan  Mycelex troche fives times daily for 1 week.  Taper prednisone as directed.  Continue on Budesonide and Brovana Nebs Twice daily  . Rinse after use.  Wear your oxygen at all times Wear your BiPAP every night  Follow with Dr Delton Coombes in 4 weeks or sooner if you have any problems. Please contact office for sooner follow up if symptoms do not improve or worsen or seek emergency care

## 2013-03-05 ENCOUNTER — Ambulatory Visit (INDEPENDENT_AMBULATORY_CARE_PROVIDER_SITE_OTHER): Payer: Managed Care, Other (non HMO) | Admitting: *Deleted

## 2013-03-05 ENCOUNTER — Telehealth: Payer: Self-pay | Admitting: *Deleted

## 2013-03-05 DIAGNOSIS — I219 Acute myocardial infarction, unspecified: Secondary | ICD-10-CM

## 2013-03-05 DIAGNOSIS — I513 Intracardiac thrombosis, not elsewhere classified: Secondary | ICD-10-CM

## 2013-03-05 DIAGNOSIS — Z7901 Long term (current) use of anticoagulants: Secondary | ICD-10-CM

## 2013-03-05 LAB — POCT INR: INR: 3.8

## 2013-03-05 MED ORDER — POTASSIUM CHLORIDE ER 10 MEQ PO TBCR: EXTENDED_RELEASE_TABLET | ORAL | Status: DC

## 2013-03-05 NOTE — Telephone Encounter (Signed)
Pt is made aware. 

## 2013-03-05 NOTE — Telephone Encounter (Signed)
I read the discharge summary from November. Potassium was stopped on the day of discharge because his potassium level was 5.3. Records indicate that this was to be resumed as an outpatient. Let him start back on K-Dur 10 milliequivalents every other day for now.

## 2013-03-05 NOTE — Telephone Encounter (Signed)
Pt was taken off potasium at last hospital stay and is now having l;eg cramps with the lasix, can he start back on potasium?

## 2013-03-07 ENCOUNTER — Ambulatory Visit (HOSPITAL_COMMUNITY)
Admission: RE | Admit: 2013-03-07 | Discharge: 2013-03-07 | Disposition: A | Discharge status: Home / Self Care | Payer: Managed Care, Other (non HMO) | Source: Ambulatory Visit | Attending: Pulmonary Disease | Admitting: Pulmonary Disease

## 2013-03-07 DIAGNOSIS — J4489 Other specified chronic obstructive pulmonary disease: Secondary | ICD-10-CM | POA: Insufficient documentation

## 2013-03-07 DIAGNOSIS — J449 Chronic obstructive pulmonary disease, unspecified: Secondary | ICD-10-CM | POA: Insufficient documentation

## 2013-03-07 LAB — PULMONARY FUNCTION TEST
DL/VA % pred: 60 %
DLCO cor % pred: 30 %
DLCO unc % pred: 27 %
DLCO unc: 7.92 ml/min/mmHg
FEF 25-75 Pre: 0.32 L/sec
FEF2575-%Pred-Post: 9 %
FEF2575-%Pred-Pre: 11 %
FEV1-%Pred-Pre: 29 %
FEV1-Post: 0.77 L
FEV1FVC-%Change-Post: -1 %
FEV6-%Change-Post: -7 %
FEV6-%Pred-Post: 47 %
FEV6-%Pred-Pre: 51 %
FEV6-Post: 1.68 L
FEV6-Pre: 1.81 L
FEV6FVC-%Change-Post: 2 %
FEV6FVC-%Pred-Post: 98 %
FVC-%Change-Post: -8 %
FVC-%Pred-Pre: 53 %
FVC-Post: 1.8 L
Post FEV6/FVC ratio: 94 %
RV % pred: 113 %
RV: 2.25 L
TLC % pred: 65 %

## 2013-03-07 LAB — BLOOD GAS, ARTERIAL
Acid-Base Excess: 12.2 mmol/L — ABNORMAL HIGH (ref 0.0–2.0)
Bicarbonate: 37.2 mEq/L — ABNORMAL HIGH (ref 20.0–24.0)
O2 Content: 4 L/min
O2 Saturation: 90.1 %
Patient temperature: 37
TCO2: 33.4 mmol/L (ref 0–100)
pCO2 arterial: 57.6 mmHg (ref 35.0–45.0)
pH, Arterial: 7.426 (ref 7.350–7.450)

## 2013-03-07 MED ORDER — ALBUTEROL SULFATE (5 MG/ML) 0.5% IN NEBU
2.5000 mg | INHALATION_SOLUTION | Freq: Once | RESPIRATORY_TRACT | Status: AC
  Administered 2013-03-07: 2.5 mg via RESPIRATORY_TRACT

## 2013-03-09 NOTE — Procedures (Signed)
NAMEKAVIR, SAVOCA                ACCOUNT NO.:  [id number]  MEDICAL RECORD NO.:  [id number]  LOCATION:  RESP                          FACILITY:  [facility]  PHYSICIAN:  Taylr Meuth L. Juanetta Gosling, M.D.DATE OF BIRTH:  Feb 11, 1956  DATE OF PROCEDURE: DATE OF DISCHARGE:  03/07/2013                           PULMONARY FUNCTION TEST   Reason for pulmonary function testing is COPD.  1. Spirometry shows a severe ventilatory defect with evidence of     airflow obstruction. 2. Lung volumes show reduction of total lung capacity and some     elevation of the residual volume.  The patient is known to have had     lung reduction surgery. 3. DLCO is severely reduced. 4. Airway resistance is elevated. 5. Arterial blood gas shows hypoxia and chronic CO2 elevation     consistent with chronic respiratory failure. 6. There is no significant bronchodilator improvement. 7. This study is consistent with the clinical diagnosis of COPD.     Shavonte Zhao L. Juanetta Gosling, M.D.     [facility]/MEDQ  D:  03/08/2013  T:  03/08/2013  Job:  [id number]

## 2013-03-14 ENCOUNTER — Other Ambulatory Visit: Payer: Self-pay | Admitting: Cardiology

## 2013-03-17 ENCOUNTER — Emergency Department (HOSPITAL_COMMUNITY): Payer: Managed Care, Other (non HMO)

## 2013-03-17 ENCOUNTER — Other Ambulatory Visit: Payer: Self-pay

## 2013-03-17 ENCOUNTER — Emergency Department (HOSPITAL_COMMUNITY)
Admission: EM | Admit: 2013-03-17 | Discharge: 2013-03-17 | Disposition: A | Discharge status: Home / Self Care | Payer: Managed Care, Other (non HMO) | Attending: Emergency Medicine | Admitting: Emergency Medicine

## 2013-03-17 ENCOUNTER — Encounter (HOSPITAL_COMMUNITY): Payer: Self-pay | Admitting: Emergency Medicine

## 2013-03-17 DIAGNOSIS — R0609 Other forms of dyspnea: Secondary | ICD-10-CM | POA: Insufficient documentation

## 2013-03-17 DIAGNOSIS — IMO0002 Reserved for concepts with insufficient information to code with codable children: Secondary | ICD-10-CM | POA: Insufficient documentation

## 2013-03-17 DIAGNOSIS — J4489 Other specified chronic obstructive pulmonary disease: Secondary | ICD-10-CM | POA: Insufficient documentation

## 2013-03-17 DIAGNOSIS — I10 Essential (primary) hypertension: Secondary | ICD-10-CM | POA: Insufficient documentation

## 2013-03-17 DIAGNOSIS — R0989 Other specified symptoms and signs involving the circulatory and respiratory systems: Secondary | ICD-10-CM | POA: Insufficient documentation

## 2013-03-17 DIAGNOSIS — I5022 Chronic systolic (congestive) heart failure: Secondary | ICD-10-CM | POA: Insufficient documentation

## 2013-03-17 DIAGNOSIS — I2510 Atherosclerotic heart disease of native coronary artery without angina pectoris: Secondary | ICD-10-CM | POA: Insufficient documentation

## 2013-03-17 DIAGNOSIS — R05 Cough: Secondary | ICD-10-CM | POA: Insufficient documentation

## 2013-03-17 DIAGNOSIS — Z9981 Dependence on supplemental oxygen: Secondary | ICD-10-CM | POA: Insufficient documentation

## 2013-03-17 DIAGNOSIS — Z9861 Coronary angioplasty status: Secondary | ICD-10-CM | POA: Insufficient documentation

## 2013-03-17 DIAGNOSIS — Z7901 Long term (current) use of anticoagulants: Secondary | ICD-10-CM | POA: Insufficient documentation

## 2013-03-17 DIAGNOSIS — I252 Old myocardial infarction: Secondary | ICD-10-CM | POA: Insufficient documentation

## 2013-03-17 DIAGNOSIS — Z87891 Personal history of nicotine dependence: Secondary | ICD-10-CM | POA: Insufficient documentation

## 2013-03-17 DIAGNOSIS — R0789 Other chest pain: Secondary | ICD-10-CM | POA: Insufficient documentation

## 2013-03-17 DIAGNOSIS — J449 Chronic obstructive pulmonary disease, unspecified: Secondary | ICD-10-CM | POA: Insufficient documentation

## 2013-03-17 DIAGNOSIS — K219 Gastro-esophageal reflux disease without esophagitis: Secondary | ICD-10-CM | POA: Insufficient documentation

## 2013-03-17 DIAGNOSIS — R5381 Other malaise: Secondary | ICD-10-CM | POA: Insufficient documentation

## 2013-03-17 DIAGNOSIS — R0600 Dyspnea, unspecified: Secondary | ICD-10-CM

## 2013-03-17 DIAGNOSIS — Z79899 Other long term (current) drug therapy: Secondary | ICD-10-CM | POA: Insufficient documentation

## 2013-03-17 DIAGNOSIS — R059 Cough, unspecified: Secondary | ICD-10-CM | POA: Insufficient documentation

## 2013-03-17 DIAGNOSIS — I4891 Unspecified atrial fibrillation: Secondary | ICD-10-CM | POA: Insufficient documentation

## 2013-03-17 LAB — CBC
MCH: 32.2 pg (ref 26.0–34.0)
MCV: 98.6 fL (ref 78.0–100.0)
Platelets: 228 10*3/uL (ref 150–400)
RBC: 4.16 MIL/uL — ABNORMAL LOW (ref 4.22–5.81)
RDW: 13.7 % (ref 11.5–15.5)

## 2013-03-17 LAB — BASIC METABOLIC PANEL
CO2: 36 mEq/L — ABNORMAL HIGH (ref 19–32)
Calcium: 9.2 mg/dL (ref 8.4–10.5)
Chloride: 100 mEq/L (ref 96–112)
Creatinine, Ser: 1.37 mg/dL — ABNORMAL HIGH (ref 0.50–1.35)
Glucose, Bld: 137 mg/dL — ABNORMAL HIGH (ref 70–99)
Sodium: 145 mEq/L (ref 135–145)

## 2013-03-17 LAB — POCT I-STAT TROPONIN I
Troponin i, poc: 0 ng/mL (ref 0.00–0.08)
Troponin i, poc: 0.02 ng/mL (ref 0.00–0.08)

## 2013-03-17 LAB — DIGOXIN LEVEL: Digoxin Level: 0.8 ng/mL (ref 0.8–2.0)

## 2013-03-17 MED ORDER — PREDNISONE 10 MG PO TABS: ORAL_TABLET | ORAL | Status: DC

## 2013-03-17 MED ORDER — IPRATROPIUM BROMIDE 0.02 % IN SOLN
0.5000 mg | Freq: Once | RESPIRATORY_TRACT | Status: AC
  Administered 2013-03-17: 0.5 mg via RESPIRATORY_TRACT
  Filled 2013-03-17: qty 2.5

## 2013-03-17 MED ORDER — FUROSEMIDE 40 MG PO TABS: 40.0000 mg | ORAL_TABLET | Freq: Every day | ORAL | Status: DC

## 2013-03-17 MED ORDER — METHYLPREDNISOLONE SODIUM SUCC 125 MG IJ SOLR
125.0000 mg | Freq: Once | INTRAMUSCULAR | Status: AC
  Administered 2013-03-17: 125 mg via INTRAVENOUS
  Filled 2013-03-17: qty 2

## 2013-03-17 MED ORDER — ALBUTEROL SULFATE (5 MG/ML) 0.5% IN NEBU
2.5000 mg | INHALATION_SOLUTION | Freq: Once | RESPIRATORY_TRACT | Status: AC
  Administered 2013-03-17: 2.5 mg via RESPIRATORY_TRACT
  Filled 2013-03-17: qty 0.5

## 2013-03-17 NOTE — ED Notes (Signed)
Shortness of breath for 4 days worsening overtime. On home oxygen 2l increased to 4l today. Denies chest pain.

## 2013-03-17 NOTE — ED Provider Notes (Addendum)
CSN: [id number]     Arrival date & time 03/17/13  1649 History   First MD Initiated Contact with Patient 03/17/13 1657     Chief Complaint  Patient presents with  . Shortness of Breath    HPI  Patient presents with a chief complaint shortness of breath intermittent chest tightness. History of COPD CHF, cor pulmonale, coronary artery disease status post PTCA. Mural thrombus on Coumadin, pallor hypertension, on home O2. Typically is on 2 L. Left hospital 3 weeks ago and was told to be on 2 or 2-1/2 at rest, and up before with activity. He does require 4-5 l at rest the last few days according to his home pulse oximeter. Describes combustion, or inflammation" in his chest. This is intermittent. Sometimes he can belch it would go away. His exertional capacity is slightly decreased over the last few weeks. He is on a prednisone taper that ended 2 weeks ago. Not currently on antibiotics. States compliance with his medications. He and his wife state that he is on a "heart lung transplant list". His wife states that she received the first call from [facility], today actually, regarding an intake appointment for consideration for this. Occasional sweats. No fevers. Dry cough. No peripheral edema. He took an extra Lasix today.  Past Medical History  Diagnosis Date  . GERD (gastroesophageal reflux disease)   . COPD (chronic obstructive pulmonary disease)   . Myocardial infarction 05/02/12  . Chronic systolic heart failure   . Essential hypertension, benign   . Cardiomyopathy     LVEF 40-45%  . Right ventricular mural thrombus     On Coumadin  . Cor pulmonale     Severe RV dysfunction  . Coronary atherosclerosis of native coronary artery     DES to [facility]  . Atrial fibrillation   . Spontaneous pneumothorax 2002    bilateral   Past Surgical History  Procedure Laterality Date  . 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 Coronary angioplasty with stent placement   04/2012    in [place], [place]   No family history on file. History  Substance Use Topics  . Smoking status: Former Smoker -- 2.00 packs/day for 25 years    Types: Cigarettes    Quit date: 04/03/1994  . Smokeless tobacco: Never Used  . Alcohol Use: No    Review of Systems  Constitutional: Positive for fatigue. Negative for fever, chills, diaphoresis and appetite change.  HENT: Negative for mouth sores, sore throat and trouble swallowing.   Eyes: Negative for visual disturbance.  Respiratory: Positive for cough, chest tightness and shortness of breath. Negative for wheezing.   Cardiovascular: Positive for chest pain. Negative for leg swelling.  Gastrointestinal: Negative for nausea, vomiting, abdominal pain, diarrhea and abdominal distention.  Endocrine: Negative for polydipsia, polyphagia and polyuria.  Genitourinary: Negative for dysuria, frequency and hematuria.  Musculoskeletal: Negative for gait problem.  Skin: Negative for color change, pallor and rash.  Neurological: Negative for dizziness, syncope, light-headedness and headaches.  Hematological: Does not bruise/bleed easily.  Psychiatric/Behavioral: Negative for behavioral problems and confusion.    Allergies  Lipitor  Home Medications   Current Outpatient Rx  Name  Route  Sig  Dispense  Refill  . acetaminophen (TYLENOL) 500 MG tablet   Oral   Take 1,000 mg by mouth every 8 (eight) hours as needed for moderate pain.         Marland Kitchen albuterol (PROVENTIL HFA;VENTOLIN HFA) 108 (90 BASE)  MCG/ACT inhaler   Inhalation   Inhale 2 puffs into the lungs every 6 (six) hours as needed for wheezing.         Marland Kitchen antiseptic oral rinse (BIOTENE) LIQD   Mouth Rinse   15 mLs by Mouth Rinse route 4 (four) times daily as needed for dry mouth.         Marland Kitchen arformoterol (BROVANA) 15 MCG/2ML NEBU   Nebulization   Take 15 mcg by nebulization 2 (two) times daily.         . budesonide (PULMICORT) 0.25 MG/2ML nebulizer solution    Nebulization   Take 0.25 mg by nebulization 2 (two) times daily.         . carvedilol (COREG) 25 MG tablet   Oral   Take 25 mg by mouth 2 (two) times daily with a meal.         . cloNIDine (CATAPRES) 0.1 MG tablet   Oral   Take 0.1 mg by mouth 3 (three) times daily.         . digoxin (LANOXIN) 0.125 MG tablet   Oral   Take 0.125 mg by mouth every morning.         . furosemide (LASIX) 40 MG tablet   Oral   Take 40-80 mg by mouth daily. *takes an extra 40mg  tablet if needed for swelling         . ipratropium-albuterol (DUONEB) 0.5-2.5 (3) MG/3ML SOLN   Nebulization   Take 3 mLs by nebulization every 6 (six) hours as needed (for shortness of breath).         . losartan (COZAAR) 100 MG tablet   Oral   Take 100 mg by mouth every morning.          Marland Kitchen omeprazole (PRILOSEC) 20 MG capsule   Oral   Take 20 mg by mouth 2 (two) times daily.          . potassium chloride (K-DUR) 10 MEQ tablet   Oral   Take 10 mEq by mouth every other day.         . prasugrel (EFFIENT) 10 MG TABS   Oral   Take 10 mg by mouth daily.         . ramipril (ALTACE) 2.5 MG capsule   Oral   Take 2.5 mg by mouth daily.         . rosuvastatin (CRESTOR) 10 MG tablet   Oral   Take 10 mg by mouth every evening.         . sodium chloride (OCEAN) 0.65 % nasal spray   Nasal   Place 1 spray into the nose every 6 (six) hours as needed for congestion.         Marland Kitchen warfarin (COUMADIN) 2.5 MG tablet   Oral   Take 3.75 mg by mouth daily.         . predniSONE (DELTASONE) 10 MG tablet      1 po q day x 3 days, then 1/2 po q day   30 tablet   0    BP 117/78  Pulse 79  Resp 18  SpO2 98% Physical Exam  Constitutional: He is oriented to person, place, and time. He appears well-developed and well-nourished. No distress.  HENT:  Head: Normocephalic.  Eyes: Conjunctivae are normal. Pupils are equal, round, and reactive to light. No scleral icterus.  Conjunctiva are not pale  Neck:  Normal range of motion. Neck supple. No thyromegaly present.  Cardiovascular: Normal  rate and regular rhythm.  Exam reveals no gallop and no friction rub.   No murmur heard. Irregular. Sinus rhythm with frequent PACs on monitor. No murmur.  Pulmonary/Chest: Effort normal and breath sounds normal. No respiratory distress. He has no wheezes. He has no rales.  Bleed diminished and distant breath sounds. Only mild prolongation.  Abdominal: Soft. Bowel sounds are normal. He exhibits no distension. There is no tenderness. There is no rebound.  Musculoskeletal: Normal range of motion.  Neurological: He is alert and oriented to person, place, and time.  Skin: Skin is warm and dry. No rash noted.  Psychiatric: He has a normal mood and affect. His behavior is normal.    ED Course  Procedures (including critical care time) Labs Review Labs Reviewed  BASIC METABOLIC PANEL - Abnormal; Notable for the following:    Potassium 3.3 (*)    CO2 36 (*)    Glucose, Bld 137 (*)    Creatinine, Ser 1.37 (*)    GFR calc non Af Amer 57 (*)    GFR calc Af Amer 66 (*)    All other components within normal limits  CBC - Abnormal; Notable for the following:    RBC 4.16 (*)    All other components within normal limits  PRO B NATRIURETIC PEPTIDE - Abnormal; Notable for the following:    Pro B Natriuretic peptide (BNP) 2269.0 (*)    All other components within normal limits  DIGOXIN LEVEL  POCT I-STAT TROPONIN I  POCT I-STAT TROPONIN I   Imaging Review Dg Chest Portable 1 View  03/17/2013   CLINICAL DATA:  Chest tightness, congestion, cough, shortness of breath, history of COPD and CAD  EXAM: PORTABLE CHEST - 1 VIEW  COMPARISON:  02/15/2013; 01/21/2013; 10/23/2012; chest CT -10/23/2012  FINDINGS: Grossly unchanged cardiac silhouette and mediastinal contours. Overall improved aeration of the lungs. Grossly unchanged lung hyperexpansion with thinning of the pulmonary parenchyma within the bilateral lung apices,  left greater than right. Postsurgical change of the bilateral mid lungs is grossly unchanged. There are grossly unchanged left basilar linear heterogeneous opacities favored to represent atelectasis or scar. No definite pleural effusion or pneumothorax. No evidence of edema. Unchanged bones.  IMPRESSION: 1. Improved aeration of the lungs without definite acute cardiopulmonary disease. 2. Grossly stable postsurgical and emphysematous change of the lungs.   Electronically Signed   By: Simonne Come M.D.   On: 03/17/2013 17:55    EKG Interpretation   None       MDM   1. Dyspnea    clinically the patient is not appear fluid overloaded. He is not dyspneic. He is not hypoxemic. His own requiring 2-3 L of nasal cannula O2. His motor exam is diminished but clear. No sign of acute coronary syndrome. His rhythm is sinus occasionally tachycardic in the 120s. History. Digoxin is 0.8. He did just tapered off his prednisone to 3 days ago when the symptoms became more acute. We'll place him back on a prednisone taper. He is mandatory abdomen from the restroom here without dyspnea think is appropriate for discharge with close followup with his physisicns.      Roney Marion, MD 03/17/13 2120  Roney Marion, MD 03/17/13 2120

## 2013-03-18 ENCOUNTER — Telehealth: Payer: Self-pay

## 2013-03-18 NOTE — Telephone Encounter (Signed)
Patient called wanting rx for Omerprazole instead of buying OTC  reviewwd with cardiologist, will defer to pcp to handle,ptients wife made aware

## 2013-03-18 NOTE — Telephone Encounter (Signed)
Patient wife called and requests Prilosec prescription be called in.  They are picking up as OTC but it is more expensive.  Please call patient wife [phone].

## 2013-03-19 ENCOUNTER — Ambulatory Visit (INDEPENDENT_AMBULATORY_CARE_PROVIDER_SITE_OTHER): Payer: Managed Care, Other (non HMO) | Admitting: *Deleted

## 2013-03-19 DIAGNOSIS — I513 Intracardiac thrombosis, not elsewhere classified: Secondary | ICD-10-CM

## 2013-03-19 DIAGNOSIS — I219 Acute myocardial infarction, unspecified: Secondary | ICD-10-CM

## 2013-03-19 DIAGNOSIS — Z7901 Long term (current) use of anticoagulants: Secondary | ICD-10-CM

## 2013-03-21 ENCOUNTER — Ambulatory Visit (INDEPENDENT_AMBULATORY_CARE_PROVIDER_SITE_OTHER): Payer: Managed Care, Other (non HMO) | Admitting: Emergency Medicine

## 2013-03-21 ENCOUNTER — Encounter: Payer: Self-pay | Admitting: Emergency Medicine

## 2013-03-21 VITALS — BP 138/86 | HR 92 | Ht 67.0 in | Wt 166.2 lb

## 2013-03-21 DIAGNOSIS — J449 Chronic obstructive pulmonary disease, unspecified: Secondary | ICD-10-CM

## 2013-03-21 NOTE — Progress Notes (Signed)
Subjective:    Patient ID: Danny [name], male    DOB: 12/10/1955, 57 y.o.   MRN: [id number] HPI Danny [name] is a 57 year old gentleman with a history of COPD and bullous emphysema status post bilateral bullectomies in  2002.    01/15/2008--Complains of 2 weeks of cough, congestion, blood tinged initially. Went to ER CXR and CT chest showed 4 R broken ribs, negative for PE. bilateral scarring, and questionable opacities. Started on Avelox for 4 days.(per pt ). Some better but still has coughing fits with thick mucus. and DOE, no energy. Pain in ribs with coughng. finished avelox 2 days. ago.   02/04/08 - returns today for f/u, has completed avelox. CP is better. Breathing is better. Cough and hemoptysis have resolved. Seldom uses ProAir.   ROV 06/01/09 -- Returns for f/u, last seen 2009. His breathing has been stable. Tells me he ran out of Advair about 2 weeks ago, noticed that his SABA use increased some. Has had some anxiety at night about not getting his meds but breathing ok. No exacerbations since last visit. No smoking. No cough or hemoptysis.   ROV 09/05/10 -- Hx COPD, emphysema and s/p bullectomies. Follows up for annual check. No flares since last visit. Uses Advair bid, ProAir prn. He believes he would miss the Advair if stopped. Uses ProAir once every several days. Occas wheze, no real cough. Occas gray sputum. No CP. Needs his meds refilled.   ROV 02/07/11 -- COPD, bullous emphysema. Presents today c/o more congestion, thick sputum, over about 5 months. He has been more active, and wonders if this is a cause. The mucous if thick yellow. Still able to work. Denies much dyspnea. No exacerbations. Uses his SABA a couple times a week.   ROV 10/23/11 -- COPD, bullous emphysema. He tried mucinex since last time. He has had some LE/toe neuropathic changes, ? Some rash on fingers. He stopped the mucinex and the sx are better. Remains on Advair. Uses albuterol rarely.  Coughs but not every day. Some DOE,  happens almost every day.   ROV 04/24/12 -- COPD, bullous emphysema s/p bullectomies. Returns for f/u.  He had a recent URI that caused probable AE, was rx with abx, also started on a trial of Spiriva x 1 week. He was already on Advair. The URI sx are better. Uses SABA typically 1-2 x a day. He is trying to get back to exercising.   ROV 05/10/12 -- COPD, bullous emphysema s/p bullectomies. Was recently evaluated w cardiac cath at [facility], had NSTEMI and cath >> R dominant system, stent placed in the circumflex. LVEDP was normal, RV was dilated. He is improved, was discharged on O2. His Spiriva was stopped and he was changed to DuoNebs during that hospitalization. His Advair was continued.  Feels drained but stable  ROV 06/25/12 -- COPD, bullous emphysema s/p bullectomies. CAD w hx NSTEMI. Doing cardiopulm rehab.  He has seen Dr Eden Emms to establish in [place]. He remains on Advair + Duonebs. He didn't tolerate spiriva. 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 He is compliant with his O2.   ROV 09/26/12 -- COPD, bullous emphysema s/p bullectomies. CAD w hx NSTEMI, secondary PAH. He reports today that he has had more dyspnea, seems to be associated with some epigastric, lower chest fullness or pain. He reports desats with O2 at 3L/min when exercising. He is on prednisone 5, started by Dr Janna Arch,  that he is using prn. He feels that it is helping with his pain.   ROV 02/04/13 -- COPD, bullous emphysema s/p bullectomies. CAD w hx NSTEMI, secondary PAH. He has a CM + RV dysfunction, mural thrombus on coumadin. On presentation today he is hypoxic. He describes increase SOB and cough over the last few months. He denies any acute exacerbations he remains on Advair, duonebs 2-3x a day. He has prednisone that is available to use prn for pain - not currently on any. He wears BiPAP every night. He had a CT scan in 10/23/12 for dyspnea and CP.   02/25/13  Post Hospital follow up  Pt returns for a post hospital follow up .  Reports is doing "much better". Still has some cough and congestion.  Admitted 02/13/2013 for COPD exacerbation. He was started on steroids and breathing treatments and antibiotics.  At discharge , Home O2 increased to 2l/m with rest 4l/m with activity.  Advair was changed to Brovana/pulmicort. Remains on Coreg 25mg  Twice daily   Remains on ACE and ARB . Pt denies chest pain, orthopnea, edema or fever.  Discharged on prednisone taper-has few days left. Wears BIPAP everynight.   ROV 03/21/13 -- COPD, bullous emphysema s/p bullectomies. CAD w hx NSTEMI, secondary PAH. He has been on chronic prednisone 5mg . He is on budesonide + brovana. He was restarted on prednisone, now on 5mg . He has done poorly when it has been tapered to 0.    Objective:   Physical Exam  Filed Vitals:   03/21/13 1214  BP: 138/86  Pulse: 92  Height: 5\' 7"  (1.702 m)  Weight: 166 lb 3.2 oz (75.388 kg)  SpO2: 90%    Gen: Pleasant, in no distress,  normal affect  ENT: no postnasal drip, scattered white patches along posterior pharynx   Neck: No JVD, no TMG, no carotid bruits  Lungs: No use of accessory muscles, clear without rales or rhonchi  Cardiovascular: RRR, heart sounds normal, no murmur or gallops, trace edema  Musculoskeletal: No deformities, no cyanosis or clubbing  Neuro: alert, non focal  Skin: Warm, no lesions or rashes   10/23/12 --  Comparison: Chest radiograph, 10/23/2012. Chest CT, 08/04/2008.  Findings: There is no evidence of a pulmonary embolism.  The heart is enlarged, but stable. There is a short left coronary  artery stents. The no mediastinal or hilar masses are appreciated.  Fascia on the right subcarinal lymph node is mildly enlarged  measuring 14 mm in short axis. There are prominent hilar lymph  nodes bilaterally are not well defined.  There are changes of emphysema and lung scarring. There is a new 8  mm  irregular focal opacity in the left upper lobe that may reflect  additional scarring. Neoplastic disease is possible. There is  some ground-glass type opacity in the posterior lateral right lower  lobe which was not present previously. This is somewhat  nonspecific. It could reflect an alveolitis. No other evidence of  infection/inflammation. No other change from the prior CT within  the lungs.  There are multiple old rib fractures on the right.  Limited evaluation of the upper abdomen is unremarkable.  There are minor degenerative changes of the thoracic spine. No  osteoblastic or osteolytic lesions.  IMPRESSION:  No evidence of a pulmonary embolism.  Significant emphysema and areas of lung scarring.  Mild ground-glass opacity in the right lower lobe, new from the  prior study. Consider an acute inflammatory or infectious  alveolitis in the proper  clinical setting.  8 mm focal opacity in the left upper lobe is new from prior study.  02/14/13 CXR Areas of lung scarring and emphysema are stable.    Assessment & Plan:    COPD (chronic obstructive pulmonary disease) continue brovana + pulmicort albuterol prn O2 at all times Continue pred 5 indefinitely Consider transplant referral.  Follow with Dr Delton Coombes in 2 months or sooner if you have any problems.

## 2013-03-21 NOTE — Assessment & Plan Note (Signed)
continue brovana + pulmicort albuterol prn O2 at all times Continue pred 5 indefinitely Consider transplant referral.  Follow with Dr Delton Coombes in 2 months or sooner if you have any problems.

## 2013-03-21 NOTE — Patient Instructions (Signed)
Please continue your inhaled medications as yo uare taking them We will keep you on prednisone 5mg  daily. We will refill this when you run out. Call our office.  Wear the oxygen at all times Follow with Dr Delton Coombes in 2 months or sooner if you have any problems.

## 2013-03-24 ENCOUNTER — Ambulatory Visit (INDEPENDENT_AMBULATORY_CARE_PROVIDER_SITE_OTHER): Payer: Managed Care, Other (non HMO) | Admitting: Pharmacist

## 2013-03-24 DIAGNOSIS — I513 Intracardiac thrombosis, not elsewhere classified: Secondary | ICD-10-CM

## 2013-03-24 DIAGNOSIS — I219 Acute myocardial infarction, unspecified: Secondary | ICD-10-CM

## 2013-03-24 DIAGNOSIS — Z7901 Long term (current) use of anticoagulants: Secondary | ICD-10-CM

## 2013-03-24 LAB — POCT INR: INR: 2

## 2013-03-29 ENCOUNTER — Encounter (HOSPITAL_COMMUNITY): Payer: Self-pay | Admitting: Emergency Medicine

## 2013-03-29 ENCOUNTER — Emergency Department (HOSPITAL_COMMUNITY): Payer: Managed Care, Other (non HMO)

## 2013-03-29 ENCOUNTER — Inpatient Hospital Stay (HOSPITAL_COMMUNITY)
Admission: EM | Admit: 2013-03-29 | Discharge: 2013-04-01 | DRG: 291 | Disposition: A | Discharge status: Home Health | Payer: Managed Care, Other (non HMO) | Attending: Internal Medicine | Admitting: Internal Medicine

## 2013-03-29 DIAGNOSIS — I2510 Atherosclerotic heart disease of native coronary artery without angina pectoris: Secondary | ICD-10-CM

## 2013-03-29 DIAGNOSIS — J449 Chronic obstructive pulmonary disease, unspecified: Secondary | ICD-10-CM

## 2013-03-29 DIAGNOSIS — M7989 Other specified soft tissue disorders: Secondary | ICD-10-CM | POA: Diagnosis present

## 2013-03-29 DIAGNOSIS — I252 Old myocardial infarction: Secondary | ICD-10-CM

## 2013-03-29 DIAGNOSIS — I471 Supraventricular tachycardia: Secondary | ICD-10-CM

## 2013-03-29 DIAGNOSIS — I4719 Other supraventricular tachycardia: Secondary | ICD-10-CM

## 2013-03-29 DIAGNOSIS — J9620 Acute and chronic respiratory failure, unspecified whether with hypoxia or hypercapnia: Secondary | ICD-10-CM

## 2013-03-29 DIAGNOSIS — I4891 Unspecified atrial fibrillation: Secondary | ICD-10-CM

## 2013-03-29 DIAGNOSIS — I509 Heart failure, unspecified: Secondary | ICD-10-CM

## 2013-03-29 DIAGNOSIS — I5043 Acute on chronic combined systolic (congestive) and diastolic (congestive) heart failure: Principal | ICD-10-CM | POA: Diagnosis present

## 2013-03-29 DIAGNOSIS — R002 Palpitations: Secondary | ICD-10-CM

## 2013-03-29 DIAGNOSIS — R5381 Other malaise: Secondary | ICD-10-CM | POA: Diagnosis present

## 2013-03-29 DIAGNOSIS — R000 Tachycardia, unspecified: Secondary | ICD-10-CM

## 2013-03-29 DIAGNOSIS — I5041 Acute combined systolic (congestive) and diastolic (congestive) heart failure: Secondary | ICD-10-CM

## 2013-03-29 DIAGNOSIS — R040 Epistaxis: Secondary | ICD-10-CM

## 2013-03-29 DIAGNOSIS — J441 Chronic obstructive pulmonary disease with (acute) exacerbation: Secondary | ICD-10-CM

## 2013-03-29 DIAGNOSIS — IMO0002 Reserved for concepts with insufficient information to code with codable children: Secondary | ICD-10-CM

## 2013-03-29 DIAGNOSIS — I272 Pulmonary hypertension, unspecified: Secondary | ICD-10-CM

## 2013-03-29 DIAGNOSIS — I429 Cardiomyopathy, unspecified: Secondary | ICD-10-CM

## 2013-03-29 DIAGNOSIS — Z87891 Personal history of nicotine dependence: Secondary | ICD-10-CM

## 2013-03-29 DIAGNOSIS — R062 Wheezing: Secondary | ICD-10-CM | POA: Diagnosis present

## 2013-03-29 DIAGNOSIS — I498 Other specified cardiac arrhythmias: Secondary | ICD-10-CM | POA: Diagnosis present

## 2013-03-29 DIAGNOSIS — Z7901 Long term (current) use of anticoagulants: Secondary | ICD-10-CM

## 2013-03-29 DIAGNOSIS — I279 Pulmonary heart disease, unspecified: Secondary | ICD-10-CM | POA: Diagnosis present

## 2013-03-29 DIAGNOSIS — I10 Essential (primary) hypertension: Secondary | ICD-10-CM

## 2013-03-29 DIAGNOSIS — J9601 Acute respiratory failure with hypoxia: Secondary | ICD-10-CM

## 2013-03-29 DIAGNOSIS — I513 Intracardiac thrombosis, not elsewhere classified: Secondary | ICD-10-CM

## 2013-03-29 DIAGNOSIS — Z9981 Dependence on supplemental oxygen: Secondary | ICD-10-CM

## 2013-03-29 DIAGNOSIS — K219 Gastro-esophageal reflux disease without esophagitis: Secondary | ICD-10-CM

## 2013-03-29 LAB — BASIC METABOLIC PANEL
CO2: 32 mEq/L (ref 19–32)
Calcium: 9 mg/dL (ref 8.4–10.5)
Chloride: 97 mEq/L (ref 96–112)
Potassium: 3.7 mEq/L (ref 3.5–5.1)
Sodium: 138 mEq/L (ref 135–145)

## 2013-03-29 LAB — CBC WITH DIFFERENTIAL/PLATELET
Basophils Absolute: 0 10*3/uL (ref 0.0–0.1)
Basophils Relative: 0 % (ref 0–1)
Eosinophils Relative: 0 % (ref 0–5)
Lymphocytes Relative: 22 % (ref 12–46)
Lymphs Abs: 2.7 10*3/uL (ref 0.7–4.0)
MCH: 31.9 pg (ref 26.0–34.0)
MCV: 98.3 fL (ref 78.0–100.0)
Monocytes Absolute: 1 10*3/uL (ref 0.1–1.0)
Neutro Abs: 8.4 10*3/uL — ABNORMAL HIGH (ref 1.7–7.7)
Platelets: 252 10*3/uL (ref 150–400)
RBC: 4.07 MIL/uL — ABNORMAL LOW (ref 4.22–5.81)
RDW: 14 % (ref 11.5–15.5)
WBC: 12.1 10*3/uL — ABNORMAL HIGH (ref 4.0–10.5)

## 2013-03-29 LAB — GLUCOSE, CAPILLARY: Glucose-Capillary: 327 mg/dL — ABNORMAL HIGH (ref 70–99)

## 2013-03-29 LAB — MRSA PCR SCREENING: MRSA by PCR: NEGATIVE

## 2013-03-29 LAB — PRO B NATRIURETIC PEPTIDE: Pro B Natriuretic peptide (BNP): 6129 pg/mL — ABNORMAL HIGH (ref 0–125)

## 2013-03-29 LAB — DIGOXIN LEVEL: Digoxin Level: 1 ng/mL (ref 0.8–2.0)

## 2013-03-29 LAB — PROTIME-INR: INR: 1.94 — ABNORMAL HIGH (ref 0.00–1.49)

## 2013-03-29 MED ORDER — INSULIN ASPART 100 UNIT/ML ~~LOC~~ SOLN
0.0000 [IU] | Freq: Three times a day (TID) | SUBCUTANEOUS | Status: DC
  Administered 2013-03-30: 15 [IU] via SUBCUTANEOUS
  Administered 2013-03-30: 3 [IU] via SUBCUTANEOUS
  Administered 2013-03-30: 8 [IU] via SUBCUTANEOUS
  Administered 2013-03-31: 11 [IU] via SUBCUTANEOUS
  Administered 2013-03-31: 5 [IU] via SUBCUTANEOUS
  Administered 2013-04-01: 3 [IU] via SUBCUTANEOUS
  Administered 2013-04-01: 5 [IU] via SUBCUTANEOUS

## 2013-03-29 MED ORDER — POTASSIUM CHLORIDE ER 10 MEQ PO TBCR
10.0000 meq | EXTENDED_RELEASE_TABLET | ORAL | Status: DC
  Administered 2013-03-30 – 2013-04-01 (×2): 10 meq via ORAL
  Filled 2013-03-29 (×2): qty 1

## 2013-03-29 MED ORDER — PANTOPRAZOLE SODIUM 40 MG PO TBEC
40.0000 mg | DELAYED_RELEASE_TABLET | Freq: Every day | ORAL | Status: DC
  Administered 2013-03-30 – 2013-04-01 (×3): 40 mg via ORAL
  Filled 2013-03-29 (×3): qty 1

## 2013-03-29 MED ORDER — SODIUM CHLORIDE 0.9 % IV SOLN
250.0000 mL | INTRAVENOUS | Status: DC | PRN
  Administered 2013-03-29: 10 mL via INTRAVENOUS

## 2013-03-29 MED ORDER — BIOTENE DRY MOUTH MT LIQD: 15.0000 mL | Freq: Four times a day (QID) | OROMUCOSAL | Status: DC | PRN

## 2013-03-29 MED ORDER — METOPROLOL TARTRATE 1 MG/ML IV SOLN
2.5000 mg | Freq: Once | INTRAVENOUS | Status: AC
  Administered 2013-03-29: 2.5 mg via INTRAVENOUS
  Filled 2013-03-29: qty 5

## 2013-03-29 MED ORDER — ARFORMOTEROL TARTRATE 15 MCG/2ML IN NEBU
15.0000 ug | INHALATION_SOLUTION | Freq: Two times a day (BID) | RESPIRATORY_TRACT | Status: DC
  Administered 2013-03-29 – 2013-04-01 (×6): 15 ug via RESPIRATORY_TRACT
  Filled 2013-03-29 (×9): qty 2

## 2013-03-29 MED ORDER — FUROSEMIDE 10 MG/ML IJ SOLN
40.0000 mg | Freq: Once | INTRAMUSCULAR | Status: AC
  Administered 2013-03-29: 40 mg via INTRAVENOUS
  Filled 2013-03-29: qty 4

## 2013-03-29 MED ORDER — METHYLPREDNISOLONE SODIUM SUCC 125 MG IJ SOLR
125.0000 mg | Freq: Once | INTRAMUSCULAR | Status: AC
  Administered 2013-03-29: 125 mg via INTRAVENOUS
  Filled 2013-03-29: qty 2

## 2013-03-29 MED ORDER — PRASUGREL HCL 10 MG PO TABS
10.0000 mg | ORAL_TABLET | Freq: Every day | ORAL | Status: DC
  Administered 2013-03-30 – 2013-04-01 (×3): 10 mg via ORAL
  Filled 2013-03-29 (×3): qty 1

## 2013-03-29 MED ORDER — RAMIPRIL 2.5 MG PO CAPS
2.5000 mg | ORAL_CAPSULE | Freq: Every day | ORAL | Status: DC
  Administered 2013-03-30 – 2013-04-01 (×3): 2.5 mg via ORAL
  Filled 2013-03-29 (×3): qty 1

## 2013-03-29 MED ORDER — SODIUM CHLORIDE 0.9 % IJ SOLN: 3.0000 mL | INTRAMUSCULAR | Status: DC | PRN

## 2013-03-29 MED ORDER — IPRATROPIUM BROMIDE 0.02 % IN SOLN
0.5000 mg | Freq: Once | RESPIRATORY_TRACT | Status: AC
  Administered 2013-03-29: 0.5 mg via RESPIRATORY_TRACT
  Filled 2013-03-29: qty 2.5

## 2013-03-29 MED ORDER — CARVEDILOL 25 MG PO TABS
25.0000 mg | ORAL_TABLET | Freq: Two times a day (BID) | ORAL | Status: DC
  Administered 2013-03-29 – 2013-04-01 (×6): 25 mg via ORAL
  Filled 2013-03-29 (×8): qty 1

## 2013-03-29 MED ORDER — DIGOXIN 125 MCG PO TABS
0.1250 mg | ORAL_TABLET | Freq: Every morning | ORAL | Status: DC
  Administered 2013-03-30 – 2013-04-01 (×3): 0.125 mg via ORAL
  Filled 2013-03-29 (×3): qty 1

## 2013-03-29 MED ORDER — LOSARTAN POTASSIUM 50 MG PO TABS
100.0000 mg | ORAL_TABLET | Freq: Every day | ORAL | Status: DC
  Administered 2013-03-30 – 2013-04-01 (×3): 100 mg via ORAL
  Filled 2013-03-29 (×3): qty 2

## 2013-03-29 MED ORDER — CEFTRIAXONE SODIUM 1 G IJ SOLR
1.0000 g | INTRAMUSCULAR | Status: DC
  Administered 2013-03-29 – 2013-03-31 (×3): 1 g via INTRAVENOUS
  Filled 2013-03-29 (×4): qty 10

## 2013-03-29 MED ORDER — MAGNESIUM SULFATE 40 MG/ML IJ SOLN
2.0000 g | Freq: Once | INTRAMUSCULAR | Status: AC
  Administered 2013-03-29: 2 g via INTRAVENOUS
  Filled 2013-03-29: qty 50

## 2013-03-29 MED ORDER — ROSUVASTATIN CALCIUM 10 MG PO TABS
10.0000 mg | ORAL_TABLET | Freq: Every day | ORAL | Status: DC
  Administered 2013-03-30 – 2013-03-31 (×2): 10 mg via ORAL
  Filled 2013-03-29 (×4): qty 1

## 2013-03-29 MED ORDER — BUDESONIDE 0.25 MG/2ML IN SUSP
0.2500 mg | Freq: Two times a day (BID) | RESPIRATORY_TRACT | Status: DC
  Administered 2013-03-29 – 2013-04-01 (×6): 0.25 mg via RESPIRATORY_TRACT
  Filled 2013-03-29 (×12): qty 2

## 2013-03-29 MED ORDER — FUROSEMIDE 10 MG/ML IJ SOLN
40.0000 mg | Freq: Every day | INTRAMUSCULAR | Status: DC
  Administered 2013-03-29 – 2013-03-31 (×3): 40 mg via INTRAVENOUS
  Filled 2013-03-29 (×6): qty 4

## 2013-03-29 MED ORDER — POTASSIUM CHLORIDE CRYS ER 20 MEQ PO TBCR
20.0000 meq | EXTENDED_RELEASE_TABLET | Freq: Once | ORAL | Status: AC
  Administered 2013-03-29: 20 meq via ORAL
  Filled 2013-03-29: qty 1

## 2013-03-29 MED ORDER — NITROGLYCERIN 2 % TD OINT
1.0000 [in_us] | TOPICAL_OINTMENT | Freq: Four times a day (QID) | TRANSDERMAL | Status: DC
  Administered 2013-03-29: 1 [in_us] via TOPICAL
  Filled 2013-03-29: qty 30

## 2013-03-29 MED ORDER — ALBUTEROL (5 MG/ML) CONTINUOUS INHALATION SOLN
15.0000 mg/h | INHALATION_SOLUTION | Freq: Once | RESPIRATORY_TRACT | Status: AC
  Administered 2013-03-29: 15 mg/h via RESPIRATORY_TRACT
  Filled 2013-03-29: qty 20

## 2013-03-29 MED ORDER — ACETAMINOPHEN 500 MG PO TABS
1000.0000 mg | ORAL_TABLET | Freq: Three times a day (TID) | ORAL | Status: DC | PRN
  Administered 2013-03-30 – 2013-03-31 (×2): 1000 mg via ORAL
  Filled 2013-03-29 (×2): qty 2

## 2013-03-29 MED ORDER — ALBUTEROL SULFATE (5 MG/ML) 0.5% IN NEBU
2.5000 mg | INHALATION_SOLUTION | Freq: Four times a day (QID) | RESPIRATORY_TRACT | Status: DC
  Administered 2013-03-29 – 2013-03-30 (×5): 2.5 mg via RESPIRATORY_TRACT
  Filled 2013-03-29 (×5): qty 0.5

## 2013-03-29 MED ORDER — ATORVASTATIN CALCIUM 10 MG PO TABS
10.0000 mg | ORAL_TABLET | Freq: Every day | ORAL | Status: DC
  Administered 2013-03-29: 10 mg via ORAL
  Filled 2013-03-29: qty 1

## 2013-03-29 MED ORDER — ALBUTEROL SULFATE (5 MG/ML) 0.5% IN NEBU: 2.5000 mg | INHALATION_SOLUTION | RESPIRATORY_TRACT | Status: DC | PRN

## 2013-03-29 MED ORDER — AZITHROMYCIN 500 MG PO TABS
500.0000 mg | ORAL_TABLET | ORAL | Status: DC
  Administered 2013-03-29 – 2013-03-31 (×3): 500 mg via ORAL
  Filled 2013-03-29 (×5): qty 1

## 2013-03-29 MED ORDER — SALINE SPRAY 0.65 % NA SOLN
1.0000 | Freq: Four times a day (QID) | NASAL | Status: DC | PRN
  Filled 2013-03-29: qty 44

## 2013-03-29 MED ORDER — SODIUM CHLORIDE 0.9 % IJ SOLN
3.0000 mL | Freq: Two times a day (BID) | INTRAMUSCULAR | Status: DC
  Administered 2013-03-30: 3 mL via INTRAVENOUS

## 2013-03-29 MED ORDER — WARFARIN SODIUM 2.5 MG PO TABS
2.5000 mg | ORAL_TABLET | Freq: Once | ORAL | Status: AC
  Administered 2013-03-29: 2.5 mg via ORAL
  Filled 2013-03-29: qty 1

## 2013-03-29 MED ORDER — CLONIDINE HCL 0.1 MG PO TABS
0.1000 mg | ORAL_TABLET | Freq: Three times a day (TID) | ORAL | Status: DC
  Administered 2013-03-29 – 2013-04-01 (×9): 0.1 mg via ORAL
  Filled 2013-03-29 (×11): qty 1

## 2013-03-29 MED ORDER — INSULIN ASPART 100 UNIT/ML ~~LOC~~ SOLN
0.0000 [IU] | Freq: Every day | SUBCUTANEOUS | Status: DC
Start: 2013-03-29 — End: 2013-04-01
  Administered 2013-03-29: 4 [IU] via SUBCUTANEOUS
  Administered 2013-03-30: 3 [IU] via SUBCUTANEOUS
  Administered 2013-03-31: 4 [IU] via SUBCUTANEOUS

## 2013-03-29 MED ORDER — WARFARIN - PHARMACIST DOSING INPATIENT: Freq: Every day | Status: DC

## 2013-03-29 MED ORDER — METHYLPREDNISOLONE SODIUM SUCC 125 MG IJ SOLR
60.0000 mg | Freq: Four times a day (QID) | INTRAMUSCULAR | Status: DC
  Administered 2013-03-29 – 2013-03-30 (×3): 60 mg via INTRAVENOUS
  Administered 2013-03-30: 04:00:00 via INTRAVENOUS
  Administered 2013-03-30: 60 mg via INTRAVENOUS
  Filled 2013-03-29: qty 2
  Filled 2013-03-29: qty 0.96
  Filled 2013-03-29: qty 2
  Filled 2013-03-29 (×5): qty 0.96
  Filled 2013-03-29: qty 2
  Filled 2013-03-29: qty 0.96

## 2013-03-29 NOTE — ED Notes (Signed)
Pt Troponin did not cross over in mini lab, Dr Wilkie Aye notified that pt Troponin is 0.02, WNL

## 2013-03-29 NOTE — ED Provider Notes (Signed)
CSN: [id number]     Arrival date & time 03/29/13  1147 History   First MD Initiated Contact with Patient 03/29/13 1204     Chief Complaint  Patient presents with  . COPD  . Shortness of Breath   (Consider location/radiation/quality/duration/timing/severity/associated sxs/prior Treatment) HPI   This is a 57 year old male with stage IV COPD, CHF with an ejection fraction of 40-45%, cor pulmonale who presents with worsening shortness of breath. Patient states over the last 3-4 days he's had increasing shortness of breath. He is normally on 2.5 L of oxygen at home when at rest but has indeed gained 3-4 L to maintain oxygen saturations. Patient states that over the last several days he and his wife have been trying to manage his shortness of breath by taking increasing doses of Lasix. Patient reports improvement of bilateral lower extremity swelling but no improvement of shortness of breath.  Patient is also currently on 5 mg of prednisone daily for COPD. He states that he is taking a total of 10 mg of prednisone the last 2 days. He reports chronic cough. He denies any fevers.  Past Medical History  Diagnosis Date  . GERD (gastroesophageal reflux disease)   . COPD (chronic obstructive pulmonary disease)   . Myocardial infarction 05/02/12  . Chronic systolic heart failure   . Essential hypertension, benign   . Cardiomyopathy     LVEF 40-45%  . Right ventricular mural thrombus     On Coumadin  . Cor pulmonale     Severe RV dysfunction  . Coronary atherosclerosis of native coronary artery     DES to OM [facility]  . Atrial fibrillation   . Spontaneous pneumothorax 2002    bilateral   Past Surgical History  Procedure Laterality Date  . 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 Coronary angioplasty with stent placement  04/2012    in [place], [place]   No family history on file. History  Substance Use Topics  . Smoking status: Former Smoker -- 2.00 packs/day  for 25 years    Types: Cigarettes    Quit date: 04/03/1994  . Smokeless tobacco: Never Used  . Alcohol Use: No    Review of Systems  Constitutional: Negative.  Negative for fever.  Respiratory: Positive for shortness of breath and wheezing. Negative for chest tightness.   Cardiovascular: Positive for leg swelling. Negative for chest pain.  Gastrointestinal: Negative.  Negative for abdominal pain.  Genitourinary: Negative.  Negative for dysuria.  Musculoskeletal: Negative for back pain.  Skin: Negative for rash.  Neurological: Negative for headaches.  All other systems reviewed and are negative.    Allergies  Lipitor  Home Medications   Current Outpatient Rx  Name  Route  Sig  Dispense  Refill  . acetaminophen (TYLENOL) 500 MG tablet   Oral   Take 1,000 mg by mouth every 8 (eight) hours as needed for moderate pain.         Marland Kitchen albuterol (PROVENTIL HFA;VENTOLIN HFA) 108 (90 BASE) MCG/ACT inhaler   Inhalation   Inhale 2 puffs into the lungs every 6 (six) hours as needed for wheezing or shortness of breath.          Marland Kitchen antiseptic oral rinse (BIOTENE) LIQD   Mouth Rinse   15 mLs by Mouth Rinse route 4 (four) times daily as needed for dry mouth.         Marland Kitchen arformoterol (BROVANA) 15 MCG/2ML NEBU   Nebulization  Take 15 mcg by nebulization 2 (two) times daily.         . budesonide (PULMICORT) 0.25 MG/2ML nebulizer solution   Nebulization   Take 0.25 mg by nebulization 2 (two) times daily.         . carvedilol (COREG) 25 MG tablet   Oral   Take 25 mg by mouth 2 (two) times daily with a meal.         . cloNIDine (CATAPRES) 0.1 MG tablet   Oral   Take 0.1 mg by mouth 3 (three) times daily.         . digoxin (LANOXIN) 0.125 MG tablet   Oral   Take 0.125 mg by mouth every morning.         . furosemide (LASIX) 40 MG tablet   Oral   Take 40-80 mg by mouth daily. *takes an extra 40mg  tablet if needed for swelling         . ipratropium-albuterol (DUONEB)  0.5-2.5 (3) MG/3ML SOLN   Nebulization   Take 3 mLs by nebulization every 6 (six) hours as needed (for shortness of breath).         . losartan (COZAAR) 100 MG tablet   Oral   Take 100 mg by mouth every morning.          Marland Kitchen omeprazole (PRILOSEC) 20 MG capsule   Oral   Take 20 mg by mouth 2 (two) times daily.          . potassium chloride (K-DUR) 10 MEQ tablet   Oral   Take 10 mEq by mouth every other day.         . prasugrel (EFFIENT) 10 MG TABS   Oral   Take 10 mg by mouth daily.         . predniSONE (DELTASONE) 10 MG tablet      1 po q day x 3 days, then 1/2 po q day   30 tablet   0   . ramipril (ALTACE) 2.5 MG capsule   Oral   Take 2.5 mg by mouth daily.         . rosuvastatin (CRESTOR) 10 MG tablet   Oral   Take 10 mg by mouth every evening.         . sodium chloride (OCEAN) 0.65 % nasal spray   Nasal   Place 1 spray into the nose every 6 (six) hours as needed for congestion.         Marland Kitchen warfarin (COUMADIN) 2.5 MG tablet   Oral   Take 3.75 mg by mouth daily.          BP 139/89  Pulse 108  Temp(Src) 98.6 F (37 C)  Resp 16  SpO2 94% Physical Exam  Nursing note and vitals reviewed. Constitutional: He is oriented to person, place, and time.  Speaking in short sentences, mild respiratory distress, nasal cannula in place  HENT:  Head: Normocephalic and atraumatic.  Eyes: Pupils are equal, round, and reactive to light.  Neck: Neck supple. No JVD present.  Cardiovascular: Regular rhythm and normal heart sounds.   No murmur heard. Tachycardia  Pulmonary/Chest: He is in respiratory distress.  Minimal air movement in all lung fields, scant wheezing  Abdominal: Soft. Bowel sounds are normal. There is no tenderness. There is no rebound.  Musculoskeletal: He exhibits edema.  1+ bilateral lower extremity edema  Lymphadenopathy:    He has no cervical adenopathy.  Neurological: He is alert and oriented to person,  place, and time.  Skin: Skin is  warm and dry.  Psychiatric: He has a normal mood and affect.    ED Course  Procedures (including critical care time) Labs Review Labs Reviewed  CBC WITH DIFFERENTIAL - Abnormal; Notable for the following:    WBC 12.1 (*)    RBC 4.07 (*)    Neutro Abs 8.4 (*)    All other components within normal limits  BASIC METABOLIC PANEL - Abnormal; Notable for the following:    Glucose, Bld 286 (*)    GFR calc non Af Amer 63 (*)    GFR calc Af Amer 73 (*)    All other components within normal limits  PRO B NATRIURETIC PEPTIDE - Abnormal; Notable for the following:    Pro B Natriuretic peptide (BNP) 6129.0 (*)    All other components within normal limits  PROTIME-INR - Abnormal; Notable for the following:    Prothrombin Time 21.6 (*)    INR 1.94 (*)    All other components within normal limits  DIGOXIN LEVEL   Imaging Review Dg Chest Portable 1 View  03/29/2013   CLINICAL DATA:  Two-day history of dyspnea, history of COPD and previous tobacco use.  EXAM: PORTABLE CHEST - 1 VIEW  COMPARISON:  Portable chest x-ray of March 17, 2013.  FINDINGS: The lungs are borderline hypoinflated. The patient has undergone previous partial lobectomy presumably on the right where there are chronic deformities of posterior ribs. The left hemidiaphragm is higher than the right. There is increased interstitial density in both lungs more conspicuous than in the past. The cardiopericardial silhouette is mildly enlarged. The central pulmonary vascularity is engorged. There is mild tortuosity of the descending thoracic aorta. There is no pleural effusion.  IMPRESSION: Mildly increased interstitial markings and prominence of the pulmonary vascularity suggests CHF. These findings have appeared since the previous study.   Electronically Signed   By: David  [place]   On: 03/29/2013 13:20    EKG Interpretation    Date/Time:  Saturday March 29 2013 12:10:22 EST Ventricular Rate:  151 PR Interval:  151 QRS  Duration: 108 QT Interval:  305 QTC Calculation: 483 R Axis:   -159 Text Interpretation:  Sinus tachycardia vs MAT Multiple premature complexes, vent  Probable right ventricular hypertrophy similar to prior Confirmed by Manha Amato  MD, Toni Amend ([id number]) on 03/29/2013 12:16:47 PM           CRITICAL CARE Performed by: Ross Marcus, F   Total critical care time: 40 min  Critical care time was exclusive of separately billable procedures and treating other patients.  Critical care was necessary to treat or prevent imminent or life-threatening deterioration.  Critical care was time spent personally by me on the following activities: development of treatment plan with patient and/or surrogate as well as nursing, discussions with consultants, evaluation of patient's response to treatment, examination of patient, obtaining history from patient or surrogate, ordering and performing treatments and interventions, ordering and review of laboratory studies, ordering and review of radiographic studies, pulse oximetry and re-evaluation of patient's condition.  MDM   3. Acute-on-chronic respiratory failure   4. CHF (congestive heart failure)   5. COPD exacerbation     Patient presents with acute on chronic shortness of breath. He is in mild respiratory distress and speaking in short sentences. He does not appear to be moving much air and is requiring more supplemental oxygen than normal. The patient was placed on continuous nebulization. EKG shows likely MAT. Patient was  given MAC, prednisone for a presumed COPD component of shortness of breath. Chest x-ray shows evidence of vascular congestion and BNP is elevated which would also suggest an element of heart failure. On reexamination, patient feels improved following a neb treatment;, patient exam notable for diffuse wheezing. Patient's shortness of breath is likely multifactorial. Patient will be given nitro paste as well as 40 mg of IV Lasix for  vascular congestion. I discussed with the on-call hospitalist who will admit to the step down unit.  Shon Baton, MD 03/29/13 [phone]

## 2013-03-29 NOTE — ED Notes (Signed)
Notified DR Horton that pt had run of V tach on monitor. Printed strip and placed in chart

## 2013-03-29 NOTE — Progress Notes (Signed)
ANTICOAGULATION CONSULT NOTE - Initial Consult  Pharmacy Consult for Coumadin Indication: R mural thrombus  Allergies  Allergen Reactions  . Lipitor [Atorvastatin] Other (See Comments)    Muscle spasms     Patient Measurements: Height: 5\' 7"  (170.2 cm) Weight: 163 lb 9.3 oz (74.2 kg) IBW/kg (Calculated) : 66.1  Labs:  Recent Labs  03/29/13 1220  HGB 13.0  HCT 40.0  PLT 252  LABPROT 21.6*  INR 1.94*  CREATININE 1.26    Assessment:  Danny [name] on Coumadin PTA for h/o R mural thrombus. Per patient and anticog clinic notes, pt was prescribed 2.5mg  daily except 3.75 mg MWF with last dose 12/26. MD would like to resume Coumadin inpatient. INR on admission is 1.94. CBC wnl. DDI noted with azithromycin and patient is continued on Prasugrel from PTA.  Goal of Therapy:  INR 2-3 Monitor platelets by anticoagulation protocol: Yes   Plan:   Coumadin 2.5mg  po x 1 tonight  Daily PT/INR  Geoffry Paradise, PharmD, BCPS Pager: [phone] 4:21 PM Pharmacy #: [id number]

## 2013-03-29 NOTE — H&P (Signed)
[facility] History and Physical  Danny [name] JXB:[id number] DOB: 1955-06-02 DOA: 03/29/2013  Referring physician: ED physician PCP: Isabella Stalling, MD   Chief Complaint: shortness of breath   HPI:  Pt is male with stage IV COPD, CHF with an ejection fraction of 40-45%, cor pulmonale, on 2.5 L oxygen at home, who presented to [facility] ED with main concern of progressively worsening shortness of breath that was initially present with exertion and has progressed to dyspnea at rest, started 3-4 days prior to admission, associated with non productive cough, subjective fevers, chills, malaise and poor oral intake, requiring increase in oxygen to 3-4 L. He also took extra dose of Lasix 40 mg tablet with no significant improvement in symptoms. Pt explains he is on Prednisone 5 mg daily for COPD. He denies chest pain, no specific alleviating or aggravating factors, no specific abdominal or urinary concerns.   In ED, pt found to have oxygen saturations in high 80's on 2 L [place] with diffuse wheezing and CXR findings worrisome for vascular congestion. TR[facility] asked to admit for further evaluation and management.   Assessment and Plan:  Principal Problem:   Acute respiratory failure with hypoxia - most likely secondary to CHF and COPD - will admit to SDU for now with close observation of clinical response - will place on Lasix 40 mg IV QD, along with BD's scheduled and as needed - will also treat with empiric ABX for now and obtain sputum analysis - follow upon sputum culture  Active Problems:   COPD (chronic obstructive pulmonary disease) - management with BD's, solumedrol, and empiric ABX as noted above - oxygen to maintain oxygen saturation > 90%   Acute combined systolic and diastolic CHF, NYHA class 1 - per last 2 D ECHO in 02/2013 EF 40-45%, grade I diastolic dysfunction - strict I's and O's, daily weights - place on Lasix 40 mg IV QD for now and monitor clinical response    Long term  (current) use of anticoagulants - for atrial fibrillation - coumadin per pharmacy    Essential hypertension, benign - reasonably stable BP on admission - continue home medical regimen    Atrial fibrillation - rate controlled - coumadin per pharmacy    GERD - continue PPI   Code Status: Full Family Communication: Pt at bedside Disposition Plan: Admit to step down unit   Review of Systems:  Constitutional: Negative for diaphoresis.  HENT: Negative for hearing loss, ear pain, nosebleeds, congestion, sore throat, neck pain, tinnitus and ear discharge.   Eyes: Negative for blurred vision, double vision, photophobia, pain, discharge and redness.  Respiratory: Per HPI   Cardiovascular: Negative for chest pain, palpitations, claudication and leg swelling.  Gastrointestinal: Negative for heartburn, constipation, blood in stool and melena.  Genitourinary: Negative for dysuria, urgency, frequency, hematuria and flank pain.  Musculoskeletal: Negative for myalgias, back pain, joint pain and falls.  Skin: Negative for itching and rash.  Neurological:Negative for tingling, tremors, sensory change, speech change, focal weakness, loss of consciousness and headaches.  Endo/Heme/Allergies: Negative for environmental allergies and polydipsia. Does not bruise/bleed easily.  Psychiatric/Behavioral: Negative for suicidal ideas. The patient is not nervous/anxious.      Past Medical History  Diagnosis Date  . GERD (gastroesophageal reflux disease)   . COPD (chronic obstructive pulmonary disease)   . Myocardial infarction 05/02/12  . Chronic systolic heart failure   . Essential hypertension, benign   . Cardiomyopathy     LVEF 40-45%  . Right ventricular mural thrombus  On Coumadin  . Cor pulmonale     Severe RV dysfunction  . Coronary atherosclerosis of native coronary artery     DES to OM [facility]  . Atrial fibrillation   . Spontaneous pneumothorax 2002    bilateral     Past Surgical History  Procedure Laterality Date  . 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 Coronary angioplasty with stent placement  04/2012    in [place], [place]    Social History:  reports that he quit smoking about 19 years ago. His smoking use included Cigarettes. He has a 50 pack-year smoking history. He has never used smokeless tobacco. He reports that he does not drink alcohol or use illicit drugs.  Allergies  Allergen Reactions  . Lipitor [Atorvastatin] Other (See Comments)    Muscle spasms     No family medical history   Prior to Admission medications   Medication Sig Start Date End Date Taking? Authorizing Provider  acetaminophen (TYLENOL) 500 MG tablet Take 1,000 mg by mouth every 8 (eight) hours as needed for moderate pain.   Yes Historical Provider, MD  albuterol (PROVENTIL HFA;VENTOLIN HFA) 108 (90 BASE) MCG/ACT inhaler Inhale 2 puffs into the lungs every 6 (six) hours as needed for wheezing or shortness of breath.    Yes Historical Provider, MD  antiseptic oral rinse (BIOTENE) LIQD 15 mLs by Mouth Rinse route 4 (four) times daily as needed for dry mouth.   Yes Historical Provider, MD  arformoterol (BROVANA) 15 MCG/2ML NEBU Take 15 mcg by nebulization 2 (two) times daily.   Yes Historical Provider, MD  budesonide (PULMICORT) 0.25 MG/2ML nebulizer solution Take 0.25 mg by nebulization 2 (two) times daily.   Yes Historical Provider, MD  carvedilol (COREG) 25 MG tablet Take 25 mg by mouth 2 (two) times daily with a meal.   Yes Historical Provider, MD  cloNIDine (CATAPRES) 0.1 MG tablet Take 0.1 mg by mouth 3 (three) times daily.   Yes Historical Provider, MD  digoxin (LANOXIN) 0.125 MG tablet Take 0.125 mg by mouth every morning.   Yes Historical Provider, MD  furosemide (LASIX) 40 MG tablet Take 40-80 mg by mouth daily. *takes an extra 40mg  tablet if needed for swelling   Yes Historical Provider, MD  ipratropium-albuterol (DUONEB) 0.5-2.5 (3) MG/3ML SOLN  Take 3 mLs by nebulization every 6 (six) hours as needed (for shortness of breath).   Yes Historical Provider, MD  losartan (COZAAR) 100 MG tablet Take 100 mg by mouth every morning.  02/03/13  Yes Historical Provider, MD  omeprazole (PRILOSEC) 20 MG capsule Take 20 mg by mouth 2 (two) times daily.    Yes Historical Provider, MD  potassium chloride (K-DUR) 10 MEQ tablet Take 10 mEq by mouth every other day.   Yes Historical Provider, MD  prasugrel (EFFIENT) 10 MG TABS Take 10 mg by mouth daily.   Yes Historical Provider, MD  predniSONE (DELTASONE) 10 MG tablet 1 po q day x 3 days, then 1/2 po q day 03/17/13  Yes Rolland Porter, MD  ramipril (ALTACE) 2.5 MG capsule Take 2.5 mg by mouth daily.   Yes Historical Provider, MD  rosuvastatin (CRESTOR) 10 MG tablet Take 10 mg by mouth every evening.   Yes Historical Provider, MD  sodium chloride (OCEAN) 0.65 % nasal spray Place 1 spray into the nose every 6 (six) hours as needed for congestion.   Yes Historical Provider, MD  warfarin (COUMADIN) 2.5 MG tablet Take 3.75 mg  by mouth daily.   Yes Historical Provider, MD    Physical Exam: Filed Vitals:   03/29/13 1315 03/29/13 1330 03/29/13 1409 03/29/13 1410  BP:    139/89  Pulse: 66 68 108   Temp:      Resp: 18 16    SpO2: 100% 100% 94%     Physical Exam  Constitutional: Appears well-developed and well-nourished. No distress.  HENT: Normocephalic. External right and left ear normal.  Dry MM Eyes: Conjunctivae and EOM are normal. PERRLA, no scleral icterus.  Neck: Normal ROM. Neck supple. No JVD. No tracheal deviation. No thyromegaly.  CVS: RRR, S1/S2 +, no murmurs, no gallops, no carotid bruit.  Pulmonary: Diminished air movement bilaterally with expiratory wheezing.  Abdominal: Soft. BS +,  no distension, tenderness, rebound or guarding.  Musculoskeletal: Normal range of motion. No edema and no tenderness.  Lymphadenopathy: No lymphadenopathy noted, cervical, inguinal. Neuro: Alert. Normal reflexes,  muscle tone coordination. No cranial nerve deficit. Skin: Skin is warm and dry. No rash noted. Not diaphoretic. No erythema. No pallor.  Psychiatric: Normal mood and affect. Behavior, judgment, thought content normal.   Labs on Admission:  Basic Metabolic Panel:  Recent Labs Lab 03/29/13 1220  NA 138  K 3.7  CL 97  CO2 32  GLUCOSE 286*  BUN 20  CREATININE 1.26  CALCIUM 9.0   CBC:  Recent Labs Lab 03/29/13 1220  WBC 12.1*  NEUTROABS 8.4*  HGB 13.0  HCT 40.0  MCV 98.3  PLT 252   Radiological Exams on Admission: Dg Chest Portable 1 View   03/29/2013    Mildly increased interstitial markings and prominence of the pulmonary vascularity suggests CHF.  EKG: Normal sinus rhythm, no ST/T wave changes  Debbora Presto, MD  [facility] Pager [phone]  If 7PM-7AM, please contact night-coverage [email or web address] Password [facility] 03/29/2013, 2:27 PM

## 2013-03-29 NOTE — ED Notes (Signed)
Pt states hx of copd, chf.  He is on 4 L of 02 at home.  75% on RA.  States that for the past week, he has been having SOB.

## 2013-03-29 NOTE — ED Notes (Signed)
Patient currently sitting on the side of the med.

## 2013-03-29 NOTE — ED Notes (Signed)
On assessment patient AXOX3. Patient c/o SOB for a few days. Patient states he has been coughing up yellow mucous. He has used his proair and his regular Neb treatments. Patient does use C-pap at home.

## 2013-03-30 DIAGNOSIS — J9600 Acute respiratory failure, unspecified whether with hypoxia or hypercapnia: Secondary | ICD-10-CM

## 2013-03-30 DIAGNOSIS — I509 Heart failure, unspecified: Secondary | ICD-10-CM

## 2013-03-30 DIAGNOSIS — I5041 Acute combined systolic (congestive) and diastolic (congestive) heart failure: Secondary | ICD-10-CM

## 2013-03-30 LAB — GLUCOSE, CAPILLARY
Glucose-Capillary: 453 mg/dL — ABNORMAL HIGH (ref 70–99)
Glucose-Capillary: 160 mg/dL — ABNORMAL HIGH (ref 70–99)
Glucose-Capillary: 284 mg/dL — ABNORMAL HIGH (ref 70–99)

## 2013-03-30 LAB — CBC
HCT: 42.5 % (ref 39.0–52.0)
Hemoglobin: 13.6 g/dL (ref 13.0–17.0)
MCHC: 32 g/dL (ref 30.0–36.0)
RBC: 4.32 MIL/uL (ref 4.22–5.81)

## 2013-03-30 LAB — EXPECTORATED SPUTUM ASSESSMENT W GRAM STAIN, RFLX TO RESP C

## 2013-03-30 LAB — PROTIME-INR
INR: 2.26 — ABNORMAL HIGH (ref 0.00–1.49)
Prothrombin Time: 24.2 seconds — ABNORMAL HIGH (ref 11.6–15.2)

## 2013-03-30 LAB — BASIC METABOLIC PANEL
BUN: 26 mg/dL — ABNORMAL HIGH (ref 6–23)
CO2: 35 mEq/L — ABNORMAL HIGH (ref 19–32)
Chloride: 97 mEq/L (ref 96–112)
Creatinine, Ser: 1.33 mg/dL (ref 0.50–1.35)
GFR calc Af Amer: 68 mL/min — ABNORMAL LOW (ref >90)
Glucose, Bld: 261 mg/dL — ABNORMAL HIGH (ref 70–99)
Potassium: 3.9 mEq/L (ref 3.5–5.1)

## 2013-03-30 LAB — LEGIONELLA ANTIGEN, URINE: Legionella Antigen, Urine: NEGATIVE

## 2013-03-30 LAB — HIV ANTIBODY (ROUTINE TESTING W REFLEX): HIV: NONREACTIVE

## 2013-03-30 LAB — EXPECTORATED SPUTUM ASSESSMENT W REFEX TO RESP CULTURE

## 2013-03-30 MED ORDER — LEVALBUTEROL HCL 1.25 MG/0.5ML IN NEBU
1.2500 mg | INHALATION_SOLUTION | RESPIRATORY_TRACT | Status: DC | PRN
  Filled 2013-03-30: qty 0.5

## 2013-03-30 MED ORDER — METHYLPREDNISOLONE SODIUM SUCC 40 MG IJ SOLR
40.0000 mg | Freq: Four times a day (QID) | INTRAMUSCULAR | Status: DC
  Administered 2013-03-30 – 2013-03-31 (×3): 40 mg via INTRAVENOUS
  Filled 2013-03-30 (×6): qty 1

## 2013-03-30 MED ORDER — DILTIAZEM HCL 60 MG PO TABS
60.0000 mg | ORAL_TABLET | Freq: Two times a day (BID) | ORAL | Status: DC
  Administered 2013-03-30: 60 mg via ORAL
  Filled 2013-03-30 (×3): qty 1

## 2013-03-30 MED ORDER — LEVALBUTEROL HCL 1.25 MG/0.5ML IN NEBU
1.2500 mg | INHALATION_SOLUTION | Freq: Four times a day (QID) | RESPIRATORY_TRACT | Status: DC
  Administered 2013-03-30 – 2013-04-01 (×6): 1.25 mg via RESPIRATORY_TRACT
  Filled 2013-03-30 (×11): qty 0.5

## 2013-03-30 MED ORDER — WARFARIN SODIUM 2.5 MG PO TABS
2.5000 mg | ORAL_TABLET | Freq: Once | ORAL | Status: AC
  Administered 2013-03-30: 2.5 mg via ORAL
  Filled 2013-03-30: qty 1

## 2013-03-30 NOTE — Progress Notes (Signed)
Patient ID: Danny [name], male   DOB: September 25, 1955, 57 y.o.   MRN: [id number]  [facility] PROGRESS NOTE  KENTARO ALEWINE NWG:[id number] DOB: 1955/12/26 DOA: 03/29/2013 PCP: Isabella Stalling, MD  Brief narrative: Pt is male with stage IV COPD, CHF with an ejection fraction of 40-45%, cor pulmonale, on 2.5 L oxygen at home, who presented to [facility] ED with main concern of progressively worsening shortness of breath that was initially present with exertion and has progressed to dyspnea at rest, started 3-4 days prior to admission, associated with non productive cough, subjective fevers, chills, malaise and poor oral intake, requiring increase in oxygen to 3-4 L. He also took extra dose of Lasix 40 mg tablet with no significant improvement in symptoms. Pt explains he is on Prednisone 5 mg daily for COPD. He denies chest pain, no specific alleviating or aggravating factors, no specific abdominal or urinary concerns.   In ED, pt found to have oxygen saturations in high 80's on 2 L [place] with diffuse wheezing and CXR findings worrisome for vascular congestion. [facility] asked to admit for further evaluation and management.   Assessment and Plan:  Principal Problem:  Acute respiratory failure with hypoxia  - most likely secondary to CHF and COPD  - pt is clinically improving and maintaining oxygen saturations at target range on 2-3 L [place]  - continue Lasix 40 mg IV QD, along with BD's scheduled and as needed  - continue to treat with empiric ABX for now, sputum analysis pending  Active Problems:  COPD (chronic obstructive pulmonary disease)  - management with BD's, solumedrol, and empiric ABX as noted above  - oxygen to maintain oxygen saturation > 90%  Acute combined systolic and diastolic CHF, NYHA class 1  - per last 2 D ECHO in 02/2013 EF 40-45%, grade I diastolic dysfunction  - strict I's and O's, daily weights  - continue Lasix 40 mg IV QD for now and monitor clinical response  - 2 D ECHO ordered   Long term (current) use of anticoagulants  - for atrial fibrillation  - coumadin per pharmacy  Essential hypertension, benign  - reasonably stable BP on admission  - continue home medical regimen  Atrial fibrillation  - place on Cardizem for better HR control  - coumadin per pharmacy  GERD  - continue PPI   Code Status: Full  Family Communication: Pt at bedside  Disposition Plan: Transfer to telemetry bed   Consultants:  None  Procedures/Studies: Dg Chest Portable 1 View    03/29/2013  Mildly increased interstitial markings and prominence of the pulmonary vascularity suggests CHF. These findings have appeared since the previous study.    Antibiotics:  Zithromax 12/27 -->  Rocephin 12/27 -->  HPI/Subjective: No events overnight.   Objective: Filed Vitals:   03/30/13 0114 03/30/13 0200 03/30/13 0400 03/30/13 0500  BP: 142/86 119/76 103/82 104/70  Pulse:  72 70 71  Temp:   97.8 F (36.6 C)   TempSrc:   Oral   Resp:  21 20 19   Height:      Weight:    74.5 kg (164 lb 3.9 oz)  SpO2:  97% 94% 88%    Intake/Output Summary (Last 24 hours) at 03/30/13 0716 Last data filed at 03/30/13 0400  Gross per 24 hour  Intake    370 ml  Output   1850 ml  Net  -1480 ml    Exam:   General:  Pt is alert, follows commands appropriately, not in acute  distress  Cardiovascular: Irregular rate and rhythm,  no rubs, no gallops  Respiratory: Clear to auscultation bilaterally, no wheezing, bibasilar crackles   Abdomen: Soft, non tender, non distended, bowel sounds present, no guarding  Extremities: +1 bilateral LE pitting edema, pulses DP and PT palpable bilaterally  Neuro: Grossly nonfocal  Data Reviewed: Basic Metabolic Panel:  Recent Labs Lab 03/29/13 1220 03/30/13 0345  NA 138 141  K 3.7 3.9  CL 97 97  CO2 32 35*  GLUCOSE 286* 261*  BUN 20 26*  CREATININE 1.26 1.33  CALCIUM 9.0 9.4   CBC:  Recent Labs Lab 03/29/13 1220 03/30/13 0345  WBC 12.1* 15.0*   NEUTROABS 8.4*  --   HGB 13.0 13.6  HCT 40.0 42.5  MCV 98.3 98.4  PLT 252 279   CBG:  Recent Labs Lab 03/29/13 2210  GLUCAP 327*  327*    Recent Results (from the past 240 hour(s))  MRSA PCR SCREENING     Status: None   Collection Time    03/29/13  3:24 PM      Result Value Range Status   MRSA by PCR NEGATIVE  NEGATIVE Final   Comment:            The GeneXpert MRSA Assay (FDA     approved for NASAL specimens     only), is one component of a     comprehensive MRSA colonization     surveillance program. It is not     intended to diagnose MRSA     infection nor to guide or     monitor treatment for     MRSA infections.     Scheduled Meds: . albuterol  2.5 mg Nebulization QID  . arformoterol  15 mcg Nebulization BID  . azithromycin  500 mg Oral Q24H  . budesonide  0.25 mg Nebulization BID  . carvedilol  25 mg Oral BID WC  . cefTRIAXone  IV  1 g Intravenous Q24H  . cloNIDine  0.1 mg Oral TID  . digoxin  0.125 mg Oral q morning - 10a  . furosemide  40 mg Intravenous Daily  . insulin aspart  0-15 Units Subcutaneous TID WC  . insulin aspart  0-5 Units Subcutaneous QHS  . losartan  100 mg Oral Daily  . methylPREDNISolone  inj  60 mg Intravenous Q6H  . pantoprazole  40 mg Oral Daily  . potassium chloride  10 mEq Oral QODAY  . prasugrel  10 mg Oral Daily  . ramipril  2.5 mg Oral Daily  . rosuvastatin  10 mg Oral q1800  . Warfarin -   Does not apply q1800   Continuous Infusions:   Debbora Presto, MD  [facility] Pager [phone]  If 7PM-7AM, please contact night-coverage [email or web address] Password [name] 03/30/2013, 7:16 AM   LOS: 1 day

## 2013-03-30 NOTE — Progress Notes (Signed)
INITIAL NUTRITION ASSESSMENT  DOCUMENTATION CODES Per approved criteria  -Non-severe (moderate) malnutrition in the context of chronic illness   INTERVENTION: Instructed patient and wife on a CHO MOD low sodium diet.  Both able to verbalize.  Written material, Low sodium diet therapy and CHO MOD diet provided with RD name and number.    Encouraged intake and discussed supplement options if unable to maintain weight despite good intake.    NUTRITION DIAGNOSIS: Increased nutrient needs related to increased work to breath as evidenced by COPD.   Goal: Intake of meals to meat >75% estimated needs  Monitor:  Intake, labs, weight trend  Reason for Assessment: Consult for assessment of nutritional requirements/status.  57 y.o. male  Admitting Dx: Acute respiratory failure with hypoxia  ASSESSMENT: Pt is male with stage IV COPD, CHF with an ejection fraction of 40-45%, cor pulmonale, on 2.5 L oxygen at home, who presented to [facility] ED with main concern of progressively worsening shortness of breath that was initially present with exertion and has progressed to dyspnea at rest, started 3-4 days prior to admission, associated with non productive cough, subjective fevers, chills, malaise and poor oral intake, requiring increase in oxygen to 3-4 L. He also took extra dose of Lasix 40 mg tablet with no significant improvement in symptoms. Pt explains he is on Prednisone 5 mg daily for COPD. He denies chest pain, no specific alleviating or aggravating factors, no specific abdominal or urinary concerns.   Patient admitted with acute reparatory failure with hypoxia.  Good intake currently and prior to admit.  Increased Glucose secondary to solumedrol.  Weight loss of 13% in the past 7 months secondary to increased respiratory issues.  Patient states that he will see someone at [facility] for potential double lung transplant.  Tries to follow a low sodium diet but had recently eating more hot dogs and bologna.     Patient meets criteria for mild/moderate malnutrition related to chronic illness AEB 13% weight loss in the past 7 months and decreased body fat and muscle mass.  Nutrition Focused Physical Exam:  Subcutaneous Fat:  Orbital Region: wnl Upper Arm Region: mild/moderate Thoracic and Lumbar Region: n/a  Muscle:  Temple Region: mild/moderate Clavicle Bone Region: wnl Clavicle and Acromion Bone Region: wnl Scapular Bone Region: wnl Dorsal Hand: mild Patellar Region: mild Anterior Thigh Region: wnl Posterior Calf Region: wnl  Edema: diastolic/systaqlic CHF    Height: Ht Readings from Last 1 Encounters:  03/29/13 5\' 7"  (1.702 m)    Weight: Wt Readings from Last 1 Encounters:  03/30/13 164 lb 3.9 oz (74.5 kg)    Ideal Body Weight: 148 lbs  % Ideal Body Weight: 111  Wt Readings from Last 10 Encounters:  03/30/13 164 lb 3.9 oz (74.5 kg)  03/21/13 166 lb 3.2 oz (75.388 kg)  02/25/13 170 lb 9.6 oz (77.384 kg)  02/20/13 168 lb (76.204 kg)  02/18/13 164 lb 14.5 oz (74.8 kg)  02/13/13 166 lb 12.8 oz (75.66 kg)  02/05/13 166 lb (75.297 kg)  02/04/13 168 lb 6.4 oz (76.386 kg)  01/23/13 166 lb (75.297 kg)  01/21/13 166 lb (75.297 kg)    Usual Body Weight: 189 lbs 08/2102  % Usual Body Weight: 87  BMI:  Body mass index is 25.72 kg/(m^2).  Estimated Nutritional Needs: Kcal: 2000-2100 Protein: 90-100 gm Fluid: 2L  Skin: intact  Diet Order: Carb Control  EDUCATION NEEDS: -Education needs addressed   Intake/Output Summary (Last 24 hours) at 03/30/13 4540 Last data filed at  03/30/13 0900  Gross per 24 hour  Intake    565 ml  Output   1850 ml  Net  -1285 ml    Labs:   Recent Labs Lab 03/29/13 1220 03/30/13 0345  NA 138 141  K 3.7 3.9  CL 97 97  CO2 32 35*  BUN 20 26*  CREATININE 1.26 1.33  CALCIUM 9.0 9.4  GLUCOSE 286* 261*    CBG (last 3)   Recent Labs  03/29/13 2210 03/30/13 0746  GLUCAP 327*  327* 266*    Scheduled Meds: . albuterol   2.5 mg Nebulization QID  . arformoterol  15 mcg Nebulization BID  . azithromycin  500 mg Oral Q24H  . budesonide  0.25 mg Nebulization BID  . carvedilol  25 mg Oral BID WC  . cefTRIAXone (ROCEPHIN)  IV  1 g Intravenous Q24H  . cloNIDine  0.1 mg Oral TID  . digoxin  0.125 mg Oral q morning - 10a  . furosemide  40 mg Intravenous Daily  . insulin aspart  0-15 Units Subcutaneous TID WC  . insulin aspart  0-5 Units Subcutaneous QHS  . losartan  100 mg Oral Daily  . methylPREDNISolone (SOLU-MEDROL) injection  60 mg Intravenous Q6H  . pantoprazole  40 mg Oral Daily  . potassium chloride  10 mEq Oral QODAY  . prasugrel  10 mg Oral Daily  . ramipril  2.5 mg Oral Daily  . rosuvastatin  10 mg Oral q1800  . sodium chloride  3 mL Intravenous Q12H  . warfarin  2.5 mg Oral ONCE-1800  . Warfarin - Pharmacist Dosing Inpatient   Does not apply q1800    Continuous Infusions:   Past Medical History  Diagnosis Date  . GERD (gastroesophageal reflux disease)   . COPD (chronic obstructive pulmonary disease)   . Myocardial infarction 05/02/12  . Chronic systolic heart failure   . Essential hypertension, benign   . Cardiomyopathy     LVEF 40-45%  . Right ventricular mural thrombus     On Coumadin  . Cor pulmonale     Severe RV dysfunction  . Coronary atherosclerosis of native coronary artery     DES to [facility]  . Atrial fibrillation   . Spontaneous pneumothorax 2002    bilateral    Past Surgical History  Procedure Laterality Date  . 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 Coronary angioplasty with stent placement  04/2012    in [place], [place]    Oran Rein, RD, LDN Clinical Inpatient Dietitian Pager:  [phone] Weekend and after hours pager:  [phone]

## 2013-03-30 NOTE — Evaluation (Signed)
Physical Therapy Evaluation Patient Details Name: Danny [name] MRN: [id number] DOB: [birth year] Today's Date: 03/30/2013 Time: [phone] PT Time Calculation (min): 32 min  PT Assessment / Plan / Recommendation History of Present Illness    Pt is male with stage IV COPD, CHF with an ejection fraction of 40-45%, cor pulmonale, on 2.5 L oxygen at home, who presented to [facility] ED with main concern of progressively worsening shortness of breath that was initially present with exertion and has progressed to dyspnea at rest, started 3-4 days prior to admission, associated with non productive cough, subjective fevers, chills, malaise and poor oral intake, requiring increase in oxygen to 3-4 L   Clinical Impression  Pt very cooperative and motivated to be up and moving but with functional mobility limited by SOB with exertion and mild ambulatory balance deficit.  With ambulation, pt HR fluctuating 82 - 124 BPM with pt stating this is normal for him.  O2 Sats on 4L fluctuating 85 - 94%.  Pt plans return home with family assist.  Need for follow up PT services dependent on acute stay progress.    PT Assessment  Patient needs continued PT services    Follow Up Recommendations  Home health PT;No PT follow up (dependent on acute stay progress)    Does the patient have the potential to tolerate intense rehabilitation      Barriers to Discharge        Equipment Recommendations  None recommended by PT    Recommendations for Other Services OT consult   Frequency Min 3X/week    Precautions / Restrictions Precautions Precautions: Fall Restrictions Weight Bearing Restrictions: No   Pertinent Vitals/Pain No c/o pain at this time.      Mobility  Bed Mobility Bed Mobility: Supine to Sit Supine to Sit: 5: Supervision;With rails Transfers Transfers: Sit to Stand;Stand to Sit Sit to Stand: 4: Min guard Stand to Sit: 4: Min guard Details for Transfer Assistance: cues for transition position and use  of UEs to self assist Ambulation/Gait Ambulation/Gait Assistance: 4: Min assist Ambulation Distance (Feet): 200 Feet (twice) Assistive device: None Ambulation/Gait Assistance Details: Min cues for pacing Gait Pattern: Step-through pattern Gait velocity: decr General Gait Details: Multiple short rests 2* fluctuating HR and O2 sats    Exercises     PT Diagnosis: Difficulty walking  PT Problem List: Decreased activity tolerance;Decreased balance;Decreased mobility PT Treatment Interventions: DME instruction;Gait training;Functional mobility training;Therapeutic activities;Balance training;Therapeutic exercise;Patient/family education     PT Goals(Current goals can be found in the care plan section) Acute Rehab PT Goals Patient Stated Goal: Resume previous lifestyle with improvement in activity tolerance PT Goal Formulation: With patient Time For Goal Achievement: 04/12/13 Potential to Achieve Goals: Good  Visit Information  Last PT Received On: 03/30/13 Assistance Needed: +2 (assist to manage equipment only)       Prior Functioning  Home Living Family/patient expects to be discharged to:: Private residence Living Arrangements: Spouse/significant other Available Help at Discharge: Family Type of Home: House Home Access: Ramped entrance Home Layout: One level Home Equipment: Environmental consultant - 2 wheels Additional Comments: On home O2 Prior Function Level of Independence: Independent Communication Communication: No difficulties    Cognition  Cognition Arousal/Alertness: Awake/alert Behavior During Therapy: WFL for tasks assessed/performed Overall Cognitive Status: Within Functional Limits for tasks assessed    Extremity/Trunk Assessment Upper Extremity Assessment Upper Extremity Assessment: Overall WFL for tasks assessed Lower Extremity Assessment Lower Extremity Assessment: Overall WFL for tasks assessed Cervical / Trunk Assessment Cervical /  Trunk Assessment: Normal    Balance Static Sitting Balance Static Sitting - Balance Support: Feet supported;No upper extremity supported Static Sitting - Level of Assistance: 7: Independent Static Sitting - Comment/# of Minutes: 2 Static Standing Balance Static Standing - Balance Support: Left upper extremity supported Static Standing - Level of Assistance: 5: Stand by assistance Static Standing - Comment/# of Minutes: 2  End of Session PT - End of Session Equipment Utilized During Treatment: Oxygen Activity Tolerance: Patient tolerated treatment well Patient left: in bed;with call bell/phone within reach;with family/visitor present Nurse Communication: Mobility status  GP     Judah Chevere 03/30/2013, 4:46 PM

## 2013-03-30 NOTE — Progress Notes (Signed)
Placed patient on CPAP via nasal mask, setting of 8.0 cm H2O per patient comfort with 3 lpm O2 bleed in.

## 2013-03-30 NOTE — Progress Notes (Signed)
ANTICOAGULATION CONSULT NOTE - Follow Up Consult  Pharmacy Consult for Coumadin Indication: R ventricular mural thrombus  Allergies  Allergen Reactions  . Lipitor [Atorvastatin] Other (See Comments)    Muscle spasms    Labs:  Recent Labs  03/29/13 1220 03/30/13 0345  HGB 13.0 13.6  HCT 40.0 42.5  PLT 252 279  LABPROT 21.6* 24.2*  INR 1.94* 2.26*  CREATININE 1.26 1.33    Assessment: 57 yoM on Coumadin PTA for h/o RV mural thrombus. Per patient and anticoag clinic notes, pt was prescribed 2.5mg  daily except 3.75 mg MWF with last dose 12/26. MD would like to resume Coumadin inpatient. INR on admission was   INR today therapeutic at 2.26. CBC wnl. No bleeding.   Drug-drug interaction noted with azithromycin and patient is continued on Prasugrel from PTA  Goal of Therapy:  INR 2-3 Monitor platelets by anticoagulation protocol: Yes   Plan:   Coumadin 2.5mg  po x 1 tonight as per home  Daily PT/INR  Pharmacy will f/u  Geoffry Paradise, PharmD, BCPS Pager: [phone] 8:17 AM Pharmacy #: [id number]

## 2013-03-30 NOTE — Progress Notes (Signed)
CSW received consult for COPD Gold Protocol -   CSW administered Depression Scale (patient scored 11/27) & Generalized Anxiety Disorder scale (patient scored 0/21).   Screening forms placed on shadow chart.   Dr. Izola Price text-paged with screening results.   RN notified.  CSW to sign off.  Providence Crosby, LCSWA Clinical Social Work [phone]

## 2013-03-31 DIAGNOSIS — I369 Nonrheumatic tricuspid valve disorder, unspecified: Secondary | ICD-10-CM

## 2013-03-31 LAB — CBC
Platelets: 253 10*3/uL (ref 150–400)
RBC: 3.85 MIL/uL — ABNORMAL LOW (ref 4.22–5.81)
WBC: 28 10*3/uL — ABNORMAL HIGH (ref 4.0–10.5)

## 2013-03-31 LAB — BASIC METABOLIC PANEL
BUN: 37 mg/dL — ABNORMAL HIGH (ref 6–23)
CO2: 32 mEq/L (ref 19–32)
Chloride: 98 mEq/L (ref 96–112)
Potassium: 4.1 mEq/L (ref 3.5–5.1)
Sodium: 139 mEq/L (ref 135–145)

## 2013-03-31 LAB — GLUCOSE, CAPILLARY
Glucose-Capillary: 224 mg/dL — ABNORMAL HIGH (ref 70–99)
Glucose-Capillary: 346 mg/dL — ABNORMAL HIGH (ref 70–99)

## 2013-03-31 LAB — PROTIME-INR
INR: 2.86 — ABNORMAL HIGH (ref 0.00–1.49)
Prothrombin Time: 29 seconds — ABNORMAL HIGH (ref 11.6–15.2)

## 2013-03-31 LAB — POCT I-STAT TROPONIN I: Troponin i, poc: 0.02 ng/mL (ref 0.00–0.08)

## 2013-03-31 MED ORDER — FUROSEMIDE 40 MG PO TABS
40.0000 mg | ORAL_TABLET | Freq: Every day | ORAL | Status: DC
  Administered 2013-03-31 – 2013-04-01 (×2): 40 mg via ORAL
  Filled 2013-03-31 (×2): qty 1

## 2013-03-31 MED ORDER — PREDNISONE 20 MG PO TABS
40.0000 mg | ORAL_TABLET | Freq: Every day | ORAL | Status: DC
  Administered 2013-04-01: 40 mg via ORAL
  Filled 2013-03-31 (×2): qty 2

## 2013-03-31 MED ORDER — WARFARIN SODIUM 1 MG PO TABS
1.0000 mg | ORAL_TABLET | Freq: Once | ORAL | Status: AC
  Administered 2013-03-31: 1 mg via ORAL
  Filled 2013-03-31: qty 1

## 2013-03-31 MED ORDER — ALBUTEROL SULFATE (5 MG/ML) 0.5% IN NEBU
INHALATION_SOLUTION | RESPIRATORY_TRACT | Status: AC
  Filled 2013-03-31: qty 0.5

## 2013-03-31 MED ORDER — METOPROLOL TARTRATE 1 MG/ML IV SOLN
5.0000 mg | Freq: Once | INTRAVENOUS | Status: AC
  Administered 2013-03-31: 5 mg via INTRAVENOUS
  Filled 2013-03-31 (×2): qty 5

## 2013-03-31 MED ORDER — DILTIAZEM HCL 30 MG PO TABS
30.0000 mg | ORAL_TABLET | Freq: Three times a day (TID) | ORAL | Status: DC
  Administered 2013-03-31 – 2013-04-01 (×3): 30 mg via ORAL
  Filled 2013-03-31 (×6): qty 1

## 2013-03-31 NOTE — Progress Notes (Signed)
Clinical Social Work Department BRIEF PSYCHOSOCIAL ASSESSMENT 03/31/2013  Patient:  Danny [name], Danny [name]     Account Number:  [id number]     Admit date:  03/29/2013  Clinical Social Worker:  Dennison Bulla  Date/Time:  03/31/2013 12:30 PM  Referred by:  Physician  Date Referred:  03/31/2013 Referred for  Other - See comment   Other Referral:   COPD gold protocol   Interview type:  Patient Other interview type:    PSYCHOSOCIAL DATA Living Status:  FAMILY Admitted from facility:   Level of care:   Primary support name:  Danny [name] Primary support relationship to patient:  SPOUSE Degree of support available:   Strong    CURRENT CONCERNS Current Concerns  Other - See comment   Other Concerns:   COPD gold protocol    SOCIAL WORK ASSESSMENT / PLAN CSW received referral to complete COPD gold protocol. Weekend CSW completed depression and anxiety screenings and informed MD and RN of results.    CSW met with patient at bedside and explained role. Patient reports he is feeling better and is hopeful to DC soon. 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 Patient lives at home with wife and reports that brothers are supportive of well. Patient reports no concerns and reports that he feels safe with returning home. Per chart review, PT recommends HH.    CSW is signing off but available if further needs arise.   Assessment/plan status:  No Further Intervention Required Other assessment/ plan:   Information/referral to community resources:   Patient declines any needs at this time    PATIENT'S/FAMILY'S RESPONSE TO PLAN OF CARE: Patient alert and oriented during assessment. Patient thanked CSW for time but reports that family is very supportive and he has no needs at this time.       [place], [place] [phone]

## 2013-03-31 NOTE — Progress Notes (Signed)
RT placed patient on nasal CPAP of 8cm with 5LNC. Patient tolerating well at this time.

## 2013-03-31 NOTE — Progress Notes (Signed)
ANTICOAGULATION CONSULT NOTE - Follow Up Consult  Pharmacy Consult for Coumadin Indication: R ventricular mural thrombus  Allergies  Allergen Reactions  . Lipitor [Atorvastatin] Other (See Comments)    Muscle spasms    Labs:  Recent Labs  03/29/13 1220 03/30/13 0345 03/31/13 0515  HGB 13.0 13.6 12.2*  HCT 40.0 42.5 37.6*  PLT 252 279 253  LABPROT 21.6* 24.2* 29.0*  INR 1.94* 2.26* 2.86*  CREATININE 1.26 1.33 1.27    Assessment: 57 yoM on Coumadin PTA for h/o RV mural thrombus. Per patient and anticoag clinic notes, pt was prescribed 2.5mg  daily except 3.75 mg MWF with last dose 12/26. MD would like to resume Coumadin inpatient. INR on admission was 1.94.  INR today therapeutic at 2.86.  Increased quickly since admission on 2.5 mg doses, potentially due to drug interaction with azithromycin.  Will provide a reduced dose today.  Hgb 12.2. No bleeding reported.   Drug-drug interaction noted with azithromycin and patient is continued on Prasugrel from PTA  Goal of Therapy:  INR 2-3 Monitor platelets by anticoagulation protocol: Yes   Plan:   Coumadin 1 mg po x 1 tonight  Daily PT/INR  Pharmacy will f/u  Clance Boll, PharmD, BCPS Pager: [phone] 03/31/2013 7:33 AM

## 2013-03-31 NOTE — Progress Notes (Signed)
Utilization review completed.  

## 2013-03-31 NOTE — Progress Notes (Signed)
Patient ID: Danny [name], male   DOB: 13-Jun-1955, 57 y.o.   MRN: [id number]  [facility] PROGRESS NOTE  MEDARD DECUIR VHQ:[id number] DOB: 01/01/1956 DOA: 03/29/2013 PCP: Isabella Stalling, MD  Brief narrative:  Pt is male with stage IV COPD, CHF with an ejection fraction of 40-45%, cor pulmonale, on 2.5 L oxygen at home, who presented to [facility] ED with main concern of progressively worsening shortness of breath that was initially present with exertion and has progressed to dyspnea at rest, started 3-4 days prior to admission, associated with non productive cough, subjective fevers, chills, malaise and poor oral intake, requiring increase in oxygen to 3-4 L. He also took extra dose of Lasix 40 mg tablet with no significant improvement in symptoms. Pt explains he is on Prednisone 5 mg daily for COPD. He denies chest pain, no specific alleviating or aggravating factors, no specific abdominal or urinary concerns.   In ED, pt found to have oxygen saturations in high 80's on 2 L [place] with diffuse wheezing and CXR findings worrisome for vascular congestion. [facility] asked to admit for further evaluation and management.   Assessment and Plan:  Principal Problem:  Acute respiratory failure with hypoxia  - most likely secondary to CHF and COPD  - pt is clinically improving and maintaining oxygen saturations at target range on 2-3 L [place]  - continue Lasix 40 mg IV QD, along with BD's scheduled and as needed and plan to transition to PO in AM - continue to treat with empiric ABX for now, sputum analysis negative to date  Active Problems:  COPD (chronic obstructive pulmonary disease)  - management with BD's, solumedrol, and empiric ABX as noted above  - d/c solumedrol and order Prednisone  - oxygen to maintain oxygen saturation > 90%  Acute combined systolic and diastolic CHF, NYHA class 1  - per last 2 D ECHO in 02/2013 EF 40-45%, grade I diastolic dysfunction  - strict I's and O's, daily weights  -  continue Lasix 40 mg IV QD for now and monitor clinical response  - 2 D ECHO ordered and pending this AM Long term (current) use of anticoagulants  - for atrial fibrillation  - coumadin per pharmacy  - placed on Cardizem PO as well  - changed BD to levalbuterol  Essential hypertension, benign  - reasonably stable BP on admission  - continue home medical regimen  Atrial fibrillation  - placed on Cardizem for better HR control  - coumadin per pharmacy  GERD  - continue PPI   Code Status: Full  Family Communication: Pt at bedside  Disposition Plan: possible d/c in AM  Consultants:  None Procedures/Studies:  Dg Chest Portable 1 View 03/29/2013 Mildly increased interstitial markings and prominence of the pulmonary vascularity suggests CHF. These findings have appeared since the previous study.  Antibiotics:  Zithromax 12/27 -->  Rocephin 12/27 -->  HPI/Subjective: No events overnight.   Objective: Filed Vitals:   03/30/13 2023 03/30/13 2120 03/30/13 2348 03/31/13 0549  BP:  108/64  105/64  Pulse:  75 64 64  Temp:  97.5 F (36.4 C)    TempSrc:  Oral    Resp:  17 18 17   Height:      Weight:      SpO2: 96% 94% 97% 100%    Intake/Output Summary (Last 24 hours) at 03/31/13 0556 Last data filed at 03/31/13 0337  Gross per 24 hour  Intake    740 ml  Output    475  ml  Net    265 ml    Exam:   General:  Pt is alert, follows commands appropriately, not in acute distress  Cardiovascular: Regular rate and rhythm, S1/S2, no murmurs, no rubs, no gallops  Respiratory: Clear to auscultation bilaterally, no wheezing, no crackles, no rhonchi  Abdomen: Soft, non tender, non distended, bowel sounds present, no guarding  Extremities: trace bilateral LE pitting edema, pulses DP and PT palpable bilaterally  Neuro: Grossly nonfocal  Data Reviewed: Basic Metabolic Panel:  Recent Labs Lab 03/29/13 1220 03/30/13 0345  NA 138 141  K 3.7 3.9  CL 97 97  CO2 32 35*  GLUCOSE  286* 261*  BUN 20 26*  CREATININE 1.26 1.33  CALCIUM 9.0 9.4   CBC:  Recent Labs Lab 03/29/13 1220 03/30/13 0345  WBC 12.1* 15.0*  NEUTROABS 8.4*  --   HGB 13.0 13.6  HCT 40.0 42.5  MCV 98.3 98.4  PLT 252 279   CBG:  Recent Labs Lab 03/29/13 2210 03/30/13 0746 03/30/13 1227 03/30/13 1622 03/30/13 2121  GLUCAP 327*  327* 266* 453* 160* 284*    Recent Results (from the past 240 hour(s))  MRSA PCR SCREENING     Status: None   Collection Time    03/29/13  3:24 PM      Result Value Range Status   MRSA by PCR NEGATIVE  NEGATIVE Final   Comment:            The GeneXpert MRSA Assay (FDA     approved for NASAL specimens     only), is one component of a     comprehensive MRSA colonization     surveillance program. It is not     intended to diagnose MRSA     infection nor to guide or     monitor treatment for     MRSA infections.  CULTURE, BLOOD (ROUTINE X 2)     Status: None   Collection Time    03/29/13  4:45 PM      Result Value Range Status   Specimen Description BLOOD LEFT HAND   Final   Special Requests BOTTLES DRAWN AEROBIC ONLY 3CC   Final   Culture  Setup Time     Final   Value: 03/29/2013 20:15     Performed at [facility]   Culture     Final   Value:        BLOOD CULTURE RECEIVED NO GROWTH TO DATE CULTURE WILL BE HELD FOR 5 DAYS BEFORE ISSUING A FINAL NEGATIVE REPORT     Performed at [facility]   Report Status PENDING   Incomplete  CULTURE, BLOOD (ROUTINE X 2)     Status: None   Collection Time    03/29/13  4:50 PM      Result Value Range Status   Specimen Description BLOOD LEFT HAND   Final   Special Requests BOTTLES DRAWN AEROBIC ONLY 3CC   Final   Culture  Setup Time     Final   Value: 03/29/2013 20:56     Performed at [facility]   Culture     Final   Value:        BLOOD CULTURE RECEIVED NO GROWTH TO DATE CULTURE WILL BE HELD FOR 5 DAYS BEFORE ISSUING A FINAL NEGATIVE REPORT     Performed at [facility]    Report Status PENDING   Incomplete  CULTURE, EXPECTORATED SPUTUM-ASSESSMENT     Status:  None   Collection Time    03/30/13  7:14 PM      Result Value Range Status   Specimen Description SPUTUM   Final   Special Requests NONE   Final   Sputum evaluation     Final   Value: THIS SPECIMEN IS ACCEPTABLE. RESPIRATORY CULTURE REPORT TO FOLLOW.   Report Status 03/30/2013 FINAL   Final     Scheduled Meds: . arformoterol  15 mcg Nebulization BID  . azithromycin  500 mg Oral Q24H  . budesonide  0.25 mg Nebulization BID  . carvedilol  25 mg Oral BID WC  . cefTRIAXone  IV  1 g Intravenous Q24H  . cloNIDine  0.1 mg Oral TID  . digoxin  0.125 mg Oral q morning - 10a  . diltiazem  60 mg Oral Q12H  . furosemide  40 mg Intravenous Daily  . insulin aspart  0-15 Units Subcutaneous TID WC  . insulin aspart  0-5 Units Subcutaneous QHS  . levalbuterol  1.25 mg Nebulization Q6H  . losartan  100 mg Oral Daily  . methylPREDNISolone inj  40 mg Intravenous Q6H  . pantoprazole  40 mg Oral Daily  . potassium chloride  10 mEq Oral QODAY  . prasugrel  10 mg Oral Daily  . ramipril  2.5 mg Oral Daily  . rosuvastatin  10 mg Oral q1800   Continuous Infusions:   Debbora Presto, MD  [facility] Pager [phone]  If 7PM-7AM, please contact night-coverage [email or web address] Password [email or web address] 03/31/2013, 5:56 AM   LOS: 2 days

## 2013-03-31 NOTE — Progress Notes (Signed)
Inpatient Diabetes Program Recommendations  AACE/ADA: New Consensus Statement on Inpatient Glycemic Control (2013)  Target Ranges:  Prepandial:   less than 140 mg/dL      Peak postprandial:   less than 180 mg/dL (1-2 hours)      Critically ill patients:  140 - 180 mg/dL     Results for Danny [name], Danny [name] (MRN [id number]) as of 03/31/2013 13:44  Ref. Range 03/30/2013 07:46 03/30/2013 12:27 03/30/2013 16:22 03/30/2013 21:21  Glucose-Capillary Latest Range: 70-99 mg/dL 782 (H) 956 (H) 213 (H) 284 (H)    Results for Danny [name], Danny [name] (MRN [id number]) as of 03/31/2013 13:44  Ref. Range 03/31/2013 08:36 03/31/2013 11:47  Glucose-Capillary Latest Range: 70-99 mg/dL 629 (H) 528 (H)    **Patient admitted with COPD, CHF.  Currently getting IV steroids.  Hyperglycemia noted.  No history of DM mentioned in H&P.   **MD- Please consider the following:  1. Add basal insulin if patient continues to have elevated fasting glucose- Levemir 10-15 units QHS (0.2 units/kg dosing) 2. Check Hemoglobin A1c to assess if patient has underlying DM   Will follow. Ambrose Finland RN, MSN, CDE Diabetes Coordinator Inpatient Diabetes Program Team Pager: [phone] (8a-10p)

## 2013-03-31 NOTE — Progress Notes (Signed)
  Echocardiogram 2D Echocardiogram has been performed.  Danny [name] 03/31/2013, 3:30 PM

## 2013-03-31 NOTE — Evaluation (Signed)
Occupational Therapy Evaluation Patient Details Name: Danny [name] MRN: [id number] DOB: Feb 17, 1956 Today's Date: 03/31/2013 Time: 4098-1191 OT Time Calculation (min): 20 min  OT Assessment / Plan / Recommendation History of present illness pt was admitted with acute respiratory failure.  H/o Copd (IV) and CHF   Clinical Impression   Pt was seen for OT evaluation.  All education was completed and pt was safe ambulating around room for ADLs.  No further OT is needed at this time.      OT Assessment  Patient does not need any further OT services    Follow Up Recommendations  No OT follow up    Barriers to Discharge      Equipment Recommendations  None recommended by OT    Recommendations for Other Services    Frequency       Precautions / Restrictions Precautions Precautions: Fall Restrictions Weight Bearing Restrictions: No   Pertinent Vitals/Pain sats 93 - 97% on 3 liters.  HR 77 - 85.  No pain reported    ADL  Toilet Transfer: Modified independent Toilet Transfer Method: Sit to stand Toilet Transfer Equipment: Comfort height toilet Transfers/Ambulation Related to ADLs: ambulated to bathroom and to closet to retrieve items.  Pt is  mod independent with ambulation, managing lines ADL Comments: Pt had performed ADL prior to OT's arrival:  performed above mobility and pt changed socks. Overall, pt is mod I for basic adls.   Pt verbalizes that he is aware of energy conservation and always paces himself and prioritizes.  He has a built in seat in shower, and an accessible shower without a ledge.   He has not used shower seat recently, but I recommended this from an energy conservation point of view.  Also reminded to keep water on luke warm side.  Pt states that he weighs himself daily to monitor for CHF.  Sats remained above 90 during OT on 3 liters 02.    OT Diagnosis:    OT Problem List:   OT Treatment Interventions:     OT Goals(Current goals can be found in the care  plan section)    Visit Information  Last OT Received On: 03/31/13 Assistance Needed: +1 History of Present Illness: pt was admitted with acute respiratory failure.  H/o Copd (IV) and CHF       Prior Functioning     Home Living Family/patient expects to be discharged to:: Private residence Home Equipment: Shower seat - built in Additional Comments: On home O2 Prior Function Level of Independence: Independent Communication Communication: No difficulties         Vision/Perception     Cognition  Cognition Arousal/Alertness: Awake/alert Behavior During Therapy: WFL for tasks assessed/performed Overall Cognitive Status: Within Functional Limits for tasks assessed    Extremity/Trunk Assessment Upper Extremity Assessment Upper Extremity Assessment: Overall WFL for tasks assessed     Mobility Transfers Sit to Stand: 7: Independent Stand to Sit: 7: Independent     Exercise     Balance Static Standing Balance Static Standing - Balance Support: No upper extremity supported Static Standing - Level of Assistance: 7: Independent Static Standing - Comment/# of Minutes: 5   End of Session OT - End of Session Activity Tolerance: Patient tolerated treatment well Patient left: in bed;with call bell/phone within reach (eob)  GO     Danny [name] 03/31/2013, 10:08 AM Marica Otter, OTR/L [phone] 03/31/2013

## 2013-04-01 ENCOUNTER — Ambulatory Visit: Payer: Managed Care, Other (non HMO) | Admitting: Cardiology

## 2013-04-01 LAB — GLUCOSE, CAPILLARY
Glucose-Capillary: 324 mg/dL — ABNORMAL HIGH (ref 70–99)
Glucose-Capillary: 214 mg/dL — ABNORMAL HIGH (ref 70–99)

## 2013-04-01 LAB — BASIC METABOLIC PANEL
BUN: 44 mg/dL — ABNORMAL HIGH (ref 6–23)
CO2: 34 mEq/L — ABNORMAL HIGH (ref 19–32)
Calcium: 9.3 mg/dL (ref 8.4–10.5)
GFR calc non Af Amer: 50 mL/min — ABNORMAL LOW (ref >90)
Glucose, Bld: 233 mg/dL — ABNORMAL HIGH (ref 70–99)
Potassium: 4.3 mEq/L (ref 3.7–5.3)

## 2013-04-01 LAB — CBC
HCT: 36.5 % — ABNORMAL LOW (ref 39.0–52.0)
Hemoglobin: 11.5 g/dL — ABNORMAL LOW (ref 13.0–17.0)
MCH: 30.9 pg (ref 26.0–34.0)
MCHC: 31.5 g/dL (ref 30.0–36.0)
RBC: 3.72 MIL/uL — ABNORMAL LOW (ref 4.22–5.81)

## 2013-04-01 LAB — PROTIME-INR: INR: 3.04 — ABNORMAL HIGH (ref 0.00–1.49)

## 2013-04-01 MED ORDER — LEVALBUTEROL HCL 1.25 MG/0.5ML IN NEBU: 1.2500 mg | INHALATION_SOLUTION | Freq: Four times a day (QID) | RESPIRATORY_TRACT | Status: DC

## 2013-04-01 MED ORDER — DILTIAZEM HCL 30 MG PO TABS
30.0000 mg | ORAL_TABLET | Freq: Two times a day (BID) | ORAL | Status: DC
  Filled 2013-04-01: qty 1

## 2013-04-01 MED ORDER — AZITHROMYCIN 500 MG PO TABS: 500.0000 mg | ORAL_TABLET | ORAL | Status: DC

## 2013-04-01 NOTE — Progress Notes (Signed)
Discharge summary sent to payer through [facility]  

## 2013-04-01 NOTE — Progress Notes (Signed)
Inpatient Diabetes Program Recommendations  AACE/ADA: New Consensus Statement on Inpatient Glycemic Control (2013)  Target Ranges:  Prepandial:   less than 140 mg/dL      Peak postprandial:   less than 180 mg/dL (1-2 hours)      Critically ill patients:  140 - 180 mg/dL     Results for Danny [name], Danny [name] (MRN [id number]) as of 04/01/2013 12:00  Ref. Range 03/31/2013 08:36 03/31/2013 11:47 03/31/2013 16:55 03/31/2013 20:39  Glucose-Capillary Latest Range: 70-99 mg/dL 409 (H) 811 (H) 914 (H) 324 (H)    Results for Danny [name], Danny [name] (MRN [id number]) as of 04/01/2013 12:00  Ref. Range 04/01/2013 07:55 04/01/2013 11:52  Glucose-Capillary Latest Range: 70-99 mg/dL 086 (H) 578 (H)    **Noted IV Solumedrol d/c'd.  Last dose given yesterday (12/29) at 11am.  Patient now on Prednisone.   **MD- Please consider adding Novolog meal coverage if patient continues to have postprandial glucose excursions.  Novolog 3 units tid with meals.   Will follow. Ambrose Finland RN, MSN, CDE Diabetes Coordinator Inpatient Diabetes Program Team Pager: [phone] (8a-10p)

## 2013-04-01 NOTE — Progress Notes (Signed)
ANTICOAGULATION CONSULT NOTE - Follow Up Consult  Pharmacy Consult for Coumadin Indication: R ventricular mural thrombus  Allergies  Allergen Reactions  . Lipitor [Atorvastatin] Other (See Comments)    Muscle spasms    Labs:  Recent Labs  03/30/13 0345 03/31/13 0515 04/01/13 0518  HGB 13.6 12.2* 11.5*  HCT 42.5 37.6* 36.5*  PLT 279 253 248  LABPROT 24.2* 29.0* 30.4*  INR 2.26* 2.86* 3.04*  CREATININE 1.33 1.27 1.53*    Assessment: 57 yoM on Coumadin PTA for h/o RV mural thrombus. Per patient and anticoag clinic notes, pt was prescribed 2.5mg  daily except 3.75 mg MWF with last dose 12/26. MD would like to resume Coumadin inpatient. INR on admission was 1.94.  INR increased to 3.04 today.  Increased quickly since potentially due to drug interaction with azithromycin.  Will hold dose tonight  Hgb 11.5, plt wnl/stable. No bleeding reported.   Drug-drug interaction noted with azithromycin and patient is continued on Prasugrel from PTA  Goal of Therapy:  INR 2-3 Monitor platelets by anticoagulation protocol: Yes   Plan:   No warfarin tonight   Daily PT/INR  Pharmacy will f/u  Clydene Fake PharmD Pager #: [phone] 12:28 PM 04/01/2013

## 2013-04-01 NOTE — Care Management Note (Signed)
    Page 1 of 1   04/01/2013     4:39:58 PM   CARE MANAGEMENT NOTE 04/01/2013  Patient:  Danny [name], Danny [name]   Account Number:  [id number]  Date Initiated:  04/01/2013  Documentation initiated by:  Colleen Can  Subjective/Objective Assessment:   dx copd, chf     Action/Plan:   Home upon discharge; has family for support. Pt has home o2. No HH needs   Anticipated DC Date:  04/01/2013   Anticipated DC Plan:  HOME/SELF CARE  In-house referral  Clinical Social Worker      DC Planning Services  CM consult      Choice offered to / List presented to:             Status of service:  Completed, signed off Medicare Important Message given?   (If response is "NO", the following Medicare IM given date fields will be blank) Date Medicare IM given:   Date Additional Medicare IM given:    Discharge Disposition:  HOME/SELF CARE  Per UR Regulation:    If discussed at Long Length of Stay Meetings, dates discussed:    Comments:

## 2013-04-01 NOTE — Discharge Summary (Signed)
Physician Discharge Summary  Danny [name] WJX:[id number] DOB: 25-Apr-1955 DOA: 03/29/2013  PCP: Isabella Stalling, MD  Admit date: 03/29/2013 Discharge date: 04/01/2013  Recommendations for Outpatient Follow-up:  1. Pt will need to follow up with PCP in 2-3 weeks post discharge 2. Please obtain BMP to evaluate electrolytes and kidney function 3. Please also check CBC to evaluate Hg and Hct levels 4. Pt was discharged on Zithromax to complete therapy for COPD/bronchitis 5. Please note that while inpatient pt has developed a-fib with RVR and he is on Coumadin already but HR was difficult to control on digoxin and coreg alone  6. Cardizem was attempted inpatient tfor better HR control and pt responded well but HR dropped to low 50's even with minimal dose of Cardizem PO 30 mg QD   Discharge Diagnoses:   Acute respiratory failure with hypoxia secondary to COPD, CHF (ssytolic and diastolic)  Principal Problem:   Acute respiratory failure with hypoxia Active Problems:   COPD (chronic obstructive pulmonary disease)   Acute combined systolic and diastolic CHF, NYHA class 1   Long term (current) use of anticoagulants   Essential hypertension, benign   Atrial fibrillation   GERD  Discharge Condition: Stable  Diet recommendation: Heart healthy diet discussed in details   History of present illness:  Pt is male with stage IV COPD, CHF with an ejection fraction of 40-45%, cor pulmonale, on 2.5 L oxygen at home, who presented to [facility] ED with main concern of progressively worsening shortness of breath that was initially present with exertion and has progressed to dyspnea at rest, started 3-4 days prior to admission, associated with non productive cough, subjective fevers, chills, malaise and poor oral intake, requiring increase in oxygen to 3-4 L. He also took extra dose of Lasix 40 mg tablet with no significant improvement in symptoms. Pt explains he is on Prednisone 5 mg daily for COPD. He denies  chest pain, no specific alleviating or aggravating factors, no specific abdominal or urinary concerns.   In ED, pt found to have oxygen saturations in high 80's on 2 L [place] with diffuse wheezing and CXR findings worrisome for vascular congestion. TRH asked to admit for further evaluation and management.   Assessment and Plan:  Principal Problem:  Acute respiratory failure with hypoxia  - most likely secondary to CHF and COPD  - pt is clinically improving and maintaining oxygen saturations at target range on 2-3 L [place]  - continued Lasix 40 mg IV QD inpatient, along with BD's scheduled and as needed and pt has responded well  - sputum analysis negative to date, pt also treated with empiric ABX Zithromax   Active Problems:  COPD (chronic obstructive pulmonary disease)  - management with BD's, solumedrol, and empiric ABX as noted above and pt responded well  - d/c solumedrol and order Prednisone, please note that pt is on chronic Prednisone at home   - oxygen to maintain oxygen saturation > 90%  Acute combined systolic and diastolic CHF, NYHA class 1  - per last 2 D ECHO in 02/2013 EF 40-45%, grade I diastolic dysfunction  - strict I's and O's, daily weights --> remained stable inpatient  - continued Lasix 40 mg IV QD and successfully transitioned to PO lasix  - 2 D ECHO with EF 50-55%, grade I diastolic dysfunction  Long term (current) use of anticoagulants  - for atrial fibrillation  - coumadin per pharmacy  - placed on Cardizem PO as well 30 mg PO QD and pt  has responded well but HR drops quickly to 50's - changed BD to levalbuterol  - no Cardizem given upon discharged due to bradycardia  - pt made aware that Cardizem was given to him and that if a-fib becomes more uncontrolled may need longer term Cardizem  Essential hypertension, benign  - reasonably stable BP on admission  - continue home medical regimen  Atrial fibrillation  - placed on Cardizem for better HR control  - stopped upon  discharge as noted above  - coumadin per pharmacy  GERD  - continue PPI   Code Status: Full  Family Communication: Pt at bedside   Consultants:  None Procedures/Studies:  Dg Chest Portable 1 View 03/29/2013 Mildly increased interstitial markings and prominence of the pulmonary vascularity suggests CHF. These findings have appeared since the previous study.  Antibiotics:  Zithromax 12/27 -->  Rocephin 12/27 --> 12/30  Discharge Exam: Filed Vitals:   04/01/13 0500  BP: 130/87  Pulse: 50  Temp: 97.3 F (36.3 C)  Resp: 22   Filed Vitals:   03/31/13 2340 04/01/13 0500 04/01/13 0810 04/01/13 1241  BP:  130/87    Pulse: 66 50    Temp:  97.3 F (36.3 C)    TempSrc:  Oral    Resp: 23 22    Height:      Weight:  75.6 kg (166 lb 10.7 oz)    SpO2: 98% 100% 98% 98%    General: Pt is alert, follows commands appropriately, not in acute distress Cardiovascular: Irregular rate and rhythm, no rubs, no gallops Respiratory: Clear to auscultation bilaterally, no wheezing, no crackles, no rhonchi Abdominal: Soft, non tender, non distended, bowel sounds +, no guarding Extremities: no edema, no cyanosis, pulses palpable bilaterally DP and PT Neuro: Grossly nonfocal  Discharge Instructions  Discharge Orders   Future Appointments Provider Department Dept Phone   04/09/2013 10:00 AM [facility] [facility] [place] [phone]   04/15/2013 10:30 AM [facility] [place] [facility] [phone]   04/16/2013 10:00 AM Jonelle Sidle, MD [facility] [place] [phone]   05/22/2013 10:00 AM Leslye Peer, MD [facility] [phone]   Future Orders Complete By Expires   Ambulatory referral to Cardiology  As directed    Diet - low sodium heart healthy  As directed    Increase activity slowly  As directed        Medication List         acetaminophen 500 MG tablet  Commonly known as:  TYLENOL  Take 1,000 mg by mouth every 8 (eight)  hours as needed for moderate pain.     albuterol 108 (90 BASE) MCG/ACT inhaler  Commonly known as:  PROVENTIL HFA;VENTOLIN HFA  Inhale 2 puffs into the lungs every 6 (six) hours as needed for wheezing or shortness of breath.     antiseptic oral rinse Liqd  15 mLs by Mouth Rinse route 4 (four) times daily as needed for dry mouth.     arformoterol 15 MCG/2ML Nebu  Commonly known as:  BROVANA  Take 15 mcg by nebulization 2 (two) times daily.     azithromycin 500 MG tablet  Commonly known as:  ZITHROMAX  Take 1 tablet (500 mg total) by mouth daily.     budesonide 0.25 MG/2ML nebulizer solution  Commonly known as:  PULMICORT  Take 0.25 mg by nebulization 2 (two) times daily.     carvedilol 25 MG tablet  Commonly known as:  COREG  Take 25 mg  by mouth 2 (two) times daily with a meal.     cloNIDine 0.1 MG tablet  Commonly known as:  CATAPRES  Take 0.1 mg by mouth 3 (three) times daily.     digoxin 0.125 MG tablet  Commonly known as:  LANOXIN  Take 0.125 mg by mouth every morning.     furosemide 40 MG tablet  Commonly known as:  LASIX  Take 40-80 mg by mouth daily. *takes an extra 40mg  tablet if needed for swelling     ipratropium-albuterol 0.5-2.5 (3) MG/3ML Soln  Commonly known as:  DUONEB  Take 3 mLs by nebulization every 6 (six) hours as needed (for shortness of breath).     levalbuterol 1.25 MG/0.5ML nebulizer solution  Commonly known as:  XOPENEX  Take 1.25 mg by nebulization every 6 (six) hours.     losartan 100 MG tablet  Commonly known as:  COZAAR  Take 100 mg by mouth every morning.     omeprazole 20 MG capsule  Commonly known as:  PRILOSEC  Take 20 mg by mouth 2 (two) times daily.     potassium chloride 10 MEQ tablet  Commonly known as:  K-DUR  Take 10 mEq by mouth every other day.     prasugrel 10 MG Tabs tablet  Commonly known as:  EFFIENT  Take 10 mg by mouth daily.     predniSONE 10 MG tablet  Commonly known as:  DELTASONE  1 po q day x 3 days,  then 1/2 po q day     ramipril 2.5 MG capsule  Commonly known as:  ALTACE  Take 2.5 mg by mouth daily.     rosuvastatin 10 MG tablet  Commonly known as:  CRESTOR  Take 10 mg by mouth every evening.     sodium chloride 0.65 % nasal spray  Commonly known as:  OCEAN  Place 1 spray into the nose every 6 (six) hours as needed for congestion.     warfarin 2.5 MG tablet  Commonly known as:  COUMADIN  Take 2.5-3.75 mg by mouth daily. Alternate with this med take one tablet one day and one and a half tablets the next day           Follow-up Information   Follow up with Isabella Stalling, MD In 1 week.   Specialty:  Internal Medicine   Contact information:   [address] [place] [address] [phone]       Follow up with Debbora Presto, MD. ( call my cell phone [phone])    Specialty:  Internal Medicine   Contact information:   [address] [place] [address] [phone]        The results of significant diagnostics from this hospitalization (including imaging, microbiology, ancillary and laboratory) are listed below for reference.     Microbiology: Recent Results (from the past 240 hour(s))  MRSA PCR SCREENING     Status: None   Collection Time    03/29/13  3:24 PM      Result Value Range Status   MRSA by PCR NEGATIVE  NEGATIVE Final   Comment:            The GeneXpert MRSA Assay (FDA     approved for NASAL specimens     only), is one component of a     comprehensive MRSA colonization     surveillance program. It is not     intended to diagnose MRSA     infection nor  to guide or     monitor treatment for     MRSA infections.  CULTURE, BLOOD (ROUTINE X 2)     Status: None   Collection Time    03/29/13  4:45 PM      Result Value Range Status   Specimen Description BLOOD LEFT HAND   Final   Special Requests BOTTLES DRAWN AEROBIC ONLY 3CC   Final   Culture  Setup Time     Final   Value: 03/29/2013 20:15     Performed at [facility]   Culture     Final   Value:        BLOOD CULTURE RECEIVED NO GROWTH TO DATE CULTURE WILL BE HELD FOR 5 DAYS BEFORE ISSUING A FINAL NEGATIVE REPORT     Performed at [facility]   Report Status PENDING   Incomplete  CULTURE, BLOOD (ROUTINE X 2)     Status: None   Collection Time    03/29/13  4:50 PM      Result Value Range Status   Specimen Description BLOOD LEFT HAND   Final   Special Requests BOTTLES DRAWN AEROBIC ONLY 3CC   Final   Culture  Setup Time     Final   Value: 03/29/2013 20:56     Performed at [facility]   Culture     Final   Value:        BLOOD CULTURE RECEIVED NO GROWTH TO DATE CULTURE WILL BE HELD FOR 5 DAYS BEFORE ISSUING A FINAL NEGATIVE REPORT     Performed at [facility]   Report Status PENDING   Incomplete  CULTURE, EXPECTORATED SPUTUM-ASSESSMENT     Status: None   Collection Time    03/30/13  7:14 PM      Result Value Range Status   Specimen Description SPUTUM   Final   Special Requests NONE   Final   Sputum evaluation     Final   Value: THIS SPECIMEN IS ACCEPTABLE. RESPIRATORY CULTURE REPORT TO FOLLOW.   Report Status 03/30/2013 FINAL   Final  CULTURE, RESPIRATORY (NON-EXPECTORATED)     Status: None   Collection Time    03/30/13  7:14 PM      Result Value Range Status   Specimen Description SPUTUM   Final   Special Requests NONE   Final   Gram Stain     Final   Value: RARE WBC PRESENT, PREDOMINANTLY PMN     FEW SQUAMOUS EPITHELIAL CELLS PRESENT     MODERATE GRAM POSITIVE RODS     FEW YEAST WITH PSEUDOHYPHAE     Performed at [facility]   Culture     Final   Value: NORMAL OROPHARYNGEAL FLORA     Performed at [facility]   Report Status PENDING   Incomplete     Labs: Basic Metabolic Panel:  Recent Labs Lab 03/29/13 1220 03/30/13 0345 03/31/13 0515 04/01/13 0518  NA 138 141 139 143  K 3.7 3.9 4.1 4.3  CL 97 97 98 101  CO2 32 35* 32 34*  GLUCOSE 286* 261* 216* 233*  BUN 20 26* 37* 44*   CREATININE 1.26 1.33 1.27 1.53*  CALCIUM 9.0 9.4 9.3 9.3   CBC:  Recent Labs Lab 03/29/13 1220 03/30/13 0345 03/31/13 0515 04/01/13 0518  WBC 12.1* 15.0* 28.0* 22.9*  NEUTROABS 8.4*  --   --   --   HGB 13.0 13.6 12.2* 11.5*  HCT 40.0  42.5 37.6* 36.5*  MCV 98.3 98.4 97.7 98.1  PLT 252 279 253 248    BNP (last 3 results)  Recent Labs  02/13/13 1332 03/17/13 1800 03/29/13 1220  PROBNP 2361.0* 2269.0* 6129.0*   CBG:  Recent Labs Lab 03/31/13 1147 03/31/13 1655 03/31/13 2039 04/01/13 0755 04/01/13 1152  GLUCAP 224* 346* 324* 180* 214*     SIGNED: Time coordinating discharge: Over 30 minutes  Debbora Presto, MD  [facility] 04/01/2013, 1:41 PM Pager [phone]  If 7PM-7AM, please contact night-coverage [email or web address] Password [email or web address]

## 2013-04-01 NOTE — Progress Notes (Signed)
Physical Therapy Treatment Patient Details Name: QUENCY TOBER MRN: [id number] DOB: September 09, 1955 Today's Date: 04/01/2013 Time: 1191-4782 PT Time Calculation (min): 30 min  PT Assessment / Plan / Recommendation  History of Present Illness pt was admitted with acute respiratory failure.  H/o Copd (IV) and CHF   PT Comments   Pt sitting on EOB on arrival on 3 lts O2 at 97%.  Removed O2 and sats avg 90% RA.  Amb on RA sats dropped to 81% so reapplied 3 lts to maintain sats at 90%.  Pt educated on importance of exhalation "blow out" when feeling SOB.   Follow Up Recommendations  Home health PT;No PT follow up     Does the patient have the potential to tolerate intense rehabilitation     Barriers to Discharge        Equipment Recommendations       Recommendations for Other Services    Frequency Min 3X/week   Progress towards PT Goals Progress towards PT goals: Progressing toward goals  Plan      Precautions / Restrictions Precautions Precautions: Fall Precaution Comments: COPD Restrictions Weight Bearing Restrictions: No     Pertinent Vitals/Pain SATURATION QUALIFICATIONS: (This note is used to comply with regulatory documentation for home oxygen)  Patient Saturations on Room Air at Rest = 90%  Patient Saturations on Room Air while Ambulating = 81%  Patient Saturations on 3 Liters of oxygen while Ambulating = 90%  Please briefly explain why patient needs home oxygen: pt demon 2/4 DOE with Hx COPD    Mobility  Bed Mobility Bed Mobility: Not assessed Details for Bed Mobility Assistance: Pt sitting EOB on arrival Transfers Transfers: Sit to Stand;Stand to Sit Sit to Stand: 6: Modified independent (Device/Increase time);From bed;From chair/3-in-1 Stand to Sit: 6: Modified independent (Device/Increase time);To chair/3-in-1 Details for Transfer Assistance: cues for transition position and use of UEs to self assist Ambulation/Gait Ambulation/Gait Assistance: 4: Min  guard Ambulation Distance (Feet): 250 Feet (125 feet x 2 one sitting rest break) Assistive device: None Ambulation/Gait Assistance Details: pt held to IV pole as he amb tested on RA lowest 80% so reapplied 3 lts avg 90% with HR 76.  Instructed pt on purse lib breathing withing emphasis on blowing out. Gait Pattern: Step-through pattern Gait velocity: decr    PT Goals (current goals can now be found in the care plan section)    Visit Information  Last PT Received On: 04/01/13 Assistance Needed: +1 History of Present Illness: pt was admitted with acute respiratory failure.  H/o Copd (IV) and CHF    Subjective Data      Cognition       Balance     End of Session PT - End of Session Equipment Utilized During Treatment: Oxygen Activity Tolerance: Patient tolerated treatment well Patient left: in bed;with call bell/phone within reach;with family/visitor present   Felecia Shelling  PTA [facility]  Acute  Rehab Pager      [phone]

## 2013-04-02 LAB — CULTURE, RESPIRATORY W GRAM STAIN: Culture: NORMAL

## 2013-04-04 LAB — CULTURE, BLOOD (ROUTINE X 2)
Culture: NO GROWTH
Culture: NO GROWTH

## 2013-04-09 ENCOUNTER — Ambulatory Visit (INDEPENDENT_AMBULATORY_CARE_PROVIDER_SITE_OTHER): Payer: Managed Care, Other (non HMO) | Admitting: *Deleted

## 2013-04-09 DIAGNOSIS — Z7901 Long term (current) use of anticoagulants: Secondary | ICD-10-CM

## 2013-04-09 DIAGNOSIS — I513 Intracardiac thrombosis, not elsewhere classified: Secondary | ICD-10-CM

## 2013-04-09 DIAGNOSIS — I219 Acute myocardial infarction, unspecified: Secondary | ICD-10-CM

## 2013-04-09 LAB — POCT INR: INR: 2.4

## 2013-04-10 ENCOUNTER — Encounter (HOSPITAL_COMMUNITY)
Admission: RE | Admit: 2013-04-10 | Discharge: 2013-04-10 | Disposition: A | Discharge status: Home / Self Care | Payer: Managed Care, Other (non HMO) | Source: Ambulatory Visit | Attending: Cardiology | Admitting: Cardiology

## 2013-04-10 VITALS — BP 112/72 | HR 74 | Ht 67.0 in | Wt 170.1 lb

## 2013-04-10 DIAGNOSIS — I214 Non-ST elevation (NSTEMI) myocardial infarction: Secondary | ICD-10-CM

## 2013-04-10 DIAGNOSIS — Z9861 Coronary angioplasty status: Secondary | ICD-10-CM

## 2013-04-10 NOTE — Patient Instructions (Signed)
Pt has finished orientation and is scheduled to start CR on 04/21/13 at 11:00. Pt has been instructed to arrive to class 15 minutes early for scheduled class. Pt has been instructed to wear comfortable clothing and shoes with rubber soles. Pt has been told to take their medications 1 hour prior to coming to class.  If the patient is not going to attend class, he/she has been instructed to call.

## 2013-04-10 NOTE — Progress Notes (Signed)
Patient was referred to CR by Dr. Eden Emms due to heart attack and stent placement. During orientation advised patient on arrival and appointment times what to wear, what to do before, during and after exercise. Reviewed attendance and class policy. Talked about inclement weather and class consultation policy. Pt is scheduled to start Cardiac Rehab on 04/21/13 at 11:00. Pt was advised to come to class 5 minutes before class starts. He was also given instructions on meeting with the dietician and attending the Family Structure classes. Pt is eager to get started. Patient not able to due walk test due to weakness.

## 2013-04-14 ENCOUNTER — Emergency Department (HOSPITAL_COMMUNITY)
Admission: EM | Admit: 2013-04-14 | Discharge: 2013-04-14 | Disposition: A | Discharge status: Home / Self Care | Payer: Managed Care, Other (non HMO) | Attending: Emergency Medicine | Admitting: Emergency Medicine

## 2013-04-14 ENCOUNTER — Encounter (HOSPITAL_COMMUNITY): Payer: Self-pay | Admitting: Emergency Medicine

## 2013-04-14 DIAGNOSIS — J449 Chronic obstructive pulmonary disease, unspecified: Secondary | ICD-10-CM | POA: Insufficient documentation

## 2013-04-14 DIAGNOSIS — I252 Old myocardial infarction: Secondary | ICD-10-CM | POA: Insufficient documentation

## 2013-04-14 DIAGNOSIS — I5022 Chronic systolic (congestive) heart failure: Secondary | ICD-10-CM | POA: Insufficient documentation

## 2013-04-14 DIAGNOSIS — I4891 Unspecified atrial fibrillation: Secondary | ICD-10-CM | POA: Insufficient documentation

## 2013-04-14 DIAGNOSIS — K219 Gastro-esophageal reflux disease without esophagitis: Secondary | ICD-10-CM | POA: Insufficient documentation

## 2013-04-14 DIAGNOSIS — Z9861 Coronary angioplasty status: Secondary | ICD-10-CM | POA: Insufficient documentation

## 2013-04-14 DIAGNOSIS — R040 Epistaxis: Secondary | ICD-10-CM | POA: Insufficient documentation

## 2013-04-14 DIAGNOSIS — Z792 Long term (current) use of antibiotics: Secondary | ICD-10-CM | POA: Insufficient documentation

## 2013-04-14 DIAGNOSIS — Z7901 Long term (current) use of anticoagulants: Secondary | ICD-10-CM | POA: Insufficient documentation

## 2013-04-14 DIAGNOSIS — Z87891 Personal history of nicotine dependence: Secondary | ICD-10-CM | POA: Insufficient documentation

## 2013-04-14 DIAGNOSIS — IMO0002 Reserved for concepts with insufficient information to code with codable children: Secondary | ICD-10-CM | POA: Insufficient documentation

## 2013-04-14 DIAGNOSIS — J4489 Other specified chronic obstructive pulmonary disease: Secondary | ICD-10-CM | POA: Insufficient documentation

## 2013-04-14 DIAGNOSIS — I2510 Atherosclerotic heart disease of native coronary artery without angina pectoris: Secondary | ICD-10-CM | POA: Insufficient documentation

## 2013-04-14 DIAGNOSIS — Z79899 Other long term (current) drug therapy: Secondary | ICD-10-CM | POA: Insufficient documentation

## 2013-04-14 DIAGNOSIS — I10 Essential (primary) hypertension: Secondary | ICD-10-CM | POA: Insufficient documentation

## 2013-04-14 LAB — CBC
HCT: 40.6 % (ref 39.0–52.0)
HEMOGLOBIN: 13.4 g/dL (ref 13.0–17.0)
MCH: 32.4 pg (ref 26.0–34.0)
MCHC: 33 g/dL (ref 30.0–36.0)
MCV: 98.1 fL (ref 78.0–100.0)
PLATELETS: 189 10*3/uL (ref 150–400)
RBC: 4.14 MIL/uL — ABNORMAL LOW (ref 4.22–5.81)
RDW: 14.4 % (ref 11.5–15.5)
WBC: 13.4 10*3/uL — ABNORMAL HIGH (ref 4.0–10.5)

## 2013-04-14 LAB — PROTIME-INR
INR: 2.24 — AB (ref 0.00–1.49)
PROTHROMBIN TIME: 24.1 s — AB (ref 11.6–15.2)

## 2013-04-14 MED ORDER — MUPIROCIN CALCIUM 2 % NA OINT: TOPICAL_OINTMENT | NASAL | Status: DC

## 2013-04-14 NOTE — ED Notes (Signed)
Per EMS report: pt c/o of nose bleed for about an hour this evening.  Pt hx of nosebleeds and COPD.  EMS gave pt 2 sprays of Afrin and bleeding was controlled until arrival to ED.  Pt on 2L [place] and O2 sats stays around 92%.  Pt a/o x 4. Skin warm and dry.

## 2013-04-14 NOTE — ED Notes (Signed)
Bed: WA01 Expected date:  Expected time:  Means of arrival:  Comments: EMS/57 yo male with nosebleed-Afrin 2 sprays per EMS with bleeding controlled at this time/IV established

## 2013-04-14 NOTE — ED Notes (Signed)
Pt reports of increased bleeding from nose.  Dr. Norlene Campbell at bedside.

## 2013-04-14 NOTE — ED Provider Notes (Signed)
CSN: [id number]     Arrival date & time 04/14/13  0018 History   First MD Initiated Contact with Patient 04/14/13 0126     Chief Complaint  Patient presents with  . Epistaxis   (Consider location/radiation/quality/duration/timing/severity/associated sxs/prior Treatment) HPI 58 year old male presents emergency department via EMS with complaint of nose bleed.  Nosebleed started about an hour prior to arrival.  Patient has history of same in past.  He has history of COPD, for which he wears 2 L.  He has required packing in the past.  He reports he was instructed by his ENT doctor to come to a  should he have another nosebleed in case he needed cautery.  Patient was given Afrin by EMS with improvement in nosebleed until arrival in the ED when he began to ooze again.  Patient is on Coumadin. Past Medical History  Diagnosis Date  . GERD (gastroesophageal reflux disease)   . COPD (chronic obstructive pulmonary disease)   . Myocardial infarction 05/02/12  . Chronic systolic heart failure   . Essential hypertension, benign   . Cardiomyopathy     LVEF 40-45%  . Right ventricular mural thrombus     On Coumadin  . Cor pulmonale     Severe RV dysfunction  . Coronary atherosclerosis of native coronary artery     DES to OM [facility]  . Atrial fibrillation   . Spontaneous pneumothorax 2002    bilateral   Past Surgical History  Procedure Laterality Date  . 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 Coronary angioplasty with stent placement  04/2012    in [place], [place]   No family history on file. History  Substance Use Topics  . Smoking status: Former Smoker -- 2.00 packs/day for 25 years    Types: Cigarettes    Quit date: 04/03/1994  . Smokeless tobacco: Never Used  . Alcohol Use: No    Review of Systems  See History of Present Illness; otherwise all other systems are reviewed and negative Allergies  Lipitor  Home Medications   Current  Outpatient Rx  Name  Route  Sig  Dispense  Refill  . acetaminophen (TYLENOL) 500 MG tablet   Oral   Take 1,000 mg by mouth every 6 (six) hours as needed for moderate pain.          Marland Kitchen albuterol (PROVENTIL HFA;VENTOLIN HFA) 108 (90 BASE) MCG/ACT inhaler   Inhalation   Inhale 2 puffs into the lungs every 6 (six) hours as needed for wheezing or shortness of breath.          Marland Kitchen antiseptic oral rinse (BIOTENE) LIQD   Mouth Rinse   15 mLs by Mouth Rinse route 4 (four) times daily as needed for dry mouth.         Marland Kitchen arformoterol (BROVANA) 15 MCG/2ML NEBU   Nebulization   Take 15 mcg by nebulization 2 (two) times daily.         . budesonide (PULMICORT) 0.25 MG/2ML nebulizer solution   Nebulization   Take 0.25 mg by nebulization 2 (two) times daily.         . carvedilol (COREG) 25 MG tablet   Oral   Take 25 mg by mouth 2 (two) times daily with a meal.         . cloNIDine (CATAPRES) 0.1 MG tablet   Oral   Take 0.1 mg by mouth 3 (three) times daily.         Marland Kitchen  digoxin (LANOXIN) 0.125 MG tablet   Oral   Take 0.125 mg by mouth every morning.         . furosemide (LASIX) 40 MG tablet   Oral   Take 40-80 mg by mouth daily. *takes an extra 40mg  tablet if needed for swelling         . ipratropium-albuterol (DUONEB) 0.5-2.5 (3) MG/3ML SOLN   Nebulization   Take 3 mLs by nebulization every 6 (six) hours as needed (for shortness of breath).         Marland Kitchen levalbuterol (XOPENEX) 1.25 MG/0.5ML nebulizer solution   Nebulization   Take 1.25 mg by nebulization every 6 (six) hours.   1 each   12   . losartan (COZAAR) 100 MG tablet   Oral   Take 100 mg by mouth every morning.          Marland Kitchen omeprazole (PRILOSEC) 20 MG capsule   Oral   Take 20 mg by mouth 2 (two) times daily.          . potassium chloride (K-DUR) 10 MEQ tablet   Oral   Take 10 mEq by mouth every other day.         . prasugrel (EFFIENT) 10 MG TABS   Oral   Take 10 mg by mouth daily.         .  predniSONE (DELTASONE) 10 MG tablet   Oral   Take 5 mg by mouth daily with breakfast.         . ramipril (ALTACE) 2.5 MG capsule   Oral   Take 2.5 mg by mouth daily.         . rosuvastatin (CRESTOR) 10 MG tablet   Oral   Take 10 mg by mouth every evening.         . sodium chloride (OCEAN) 0.65 % nasal spray   Nasal   Place 1 spray into the nose every 6 (six) hours as needed for congestion.         Marland Kitchen warfarin (COUMADIN) 2.5 MG tablet   Oral   Take 2.5-3.75 mg by mouth daily. Take 1 tablet alternating with 1.5 tablets every other day         . azithromycin (ZITHROMAX) 500 MG tablet   Oral   Take 1 tablet (500 mg total) by mouth daily.   3 tablet   0   . mupirocin nasal ointment (BACTROBAN) 2 %      Apply in each nostril bid   10 g   0    BP 122/70  Pulse 86  Temp(Src) 97.5 F (36.4 C) (Oral)  Resp 18  SpO2 91% Physical Exam  Nursing note and vitals reviewed. Constitutional: He appears well-developed and well-nourished. No distress.  HENT:  Head: Normocephalic and atraumatic.  Mouth/Throat: Oropharynx is clear and moist.  Slow bleed, noted from right nare  Eyes: Conjunctivae and EOM are normal. Pupils are equal, round, and reactive to light.  Cardiovascular: Normal rate, regular rhythm, normal heart sounds and intact distal pulses.  Exam reveals no gallop and no friction rub.   No murmur heard. Pulmonary/Chest: Effort normal and breath sounds normal. No respiratory distress. He has no wheezes. He has no rales. He exhibits no tenderness.  Skin: Skin is warm and dry. No rash noted. No erythema. No pallor.    ED Course  EPISTAXIS MANAGEMENT Date/Time: 04/14/2013 1:30 AM Performed by: Olivia Mackie Authorized by: Olivia Mackie Consent: Verbal consent obtained. written consent obtained.  Treatment site: right anterior Repair method: silver nitrate Post-procedure assessment: bleeding decreased Treatment complexity: simple Recurrence: recurrence of recent  bleed Comments: Patient had total of 3 separate silver nitrate applications.  Over the course of an hour, he has had no further bleeding   (including critical care time) Labs Review Labs Reviewed  CBC - Abnormal; Notable for the following:    WBC 13.4 (*)    RBC 4.14 (*)    All other components within normal limits  PROTIME-INR - Abnormal; Notable for the following:    Prothrombin Time 24.1 (*)    INR 2.24 (*)    All other components within normal limits   Imaging Review No results found.  EKG Interpretation   None       MDM   1. Anterior epistaxis    58 year old male with anterior epistaxis.  Bleeding controlled after several occasions of silver nitrate.  Patient to followup with his ENT physician    Olivia Mackielga M Mireya Meditz, MD 04/14/13 [phone]

## 2013-04-14 NOTE — Discharge Instructions (Signed)
Your nose was cauterized tonight with silver nitrate.  Be cautious when blowing your nose.  You may use afrin 2 sprays up to twice a day for nosebleed.  Follow up with Dr Jenne Pane. Return to the ER for worsening condition or new concerning symptoms   Nosebleed Nosebleeds can be caused by many conditions including trauma, infections, polyps, foreign bodies, dry mucous membranes or climate, medications and air conditioning. Most nosebleeds occur in the front of the nose. It is because of this location that most nosebleeds can be controlled by pinching the nostrils gently and continuously. Do this for at least 10 to 20 minutes. The reason for this long continuous pressure is that you must hold it long enough for the blood to clot. If during that 10 to 20 minute time period, pressure is released, the process may have to be started again. The nosebleed may stop by itself, quit with pressure, need concentrated heating (cautery) or stop with pressure from packing. HOME CARE INSTRUCTIONS   If your nose was packed, try to maintain the pack inside until your caregiver removes it. If a gauze pack was used and it starts to fall out, gently replace or cut the end off. Do not cut if a balloon catheter was used to pack the nose. Otherwise, do not remove unless instructed.  Avoid blowing your nose for 12 hours after treatment. This could dislodge the pack or clot and start bleeding again.  If the bleeding starts again, sit up and bending forward, gently pinch the front half of your nose continuously for 20 minutes.  If bleeding was caused by dry mucous membranes, cover the inside of your nose every morning with a petroleum or antibiotic ointment. Use your little fingertip as an applicator. Do this as needed during dry weather. This will keep the mucous membranes moist and allow them to heal.  Maintain humidity in your home by using less air conditioning or using a humidifier.  Do not use aspirin or medications which  make bleeding more likely. Your caregiver can give you recommendations on this.  Resume normal activities as able but try to avoid straining, lifting or bending at the waist for several days.  If the nosebleeds become recurrent and the cause is unknown, your caregiver may suggest laboratory tests. SEEK IMMEDIATE MEDICAL CARE IF:   Bleeding recurs and cannot be controlled.  There is unusual bleeding from or bruising on other parts of the body.  You have a fever.  Nosebleeds continue.  There is any worsening of the condition which originally brought you in.  You become lightheaded, feel faint, become sweaty or vomit blood. MAKE SURE YOU:   Understand these instructions.  Will watch your condition.  Will get help right away if you are not doing well or get worse. Document Released: 12/28/2004 Document Revised: 06/12/2011 Document Reviewed: 02/19/2009 [facility] Patient Information 2014 [place], [place].

## 2013-04-15 ENCOUNTER — Encounter (HOSPITAL_COMMUNITY): Payer: Managed Care, Other (non HMO)

## 2013-04-16 ENCOUNTER — Encounter: Payer: Self-pay | Admitting: Cardiology

## 2013-04-16 ENCOUNTER — Ambulatory Visit (INDEPENDENT_AMBULATORY_CARE_PROVIDER_SITE_OTHER): Payer: Managed Care, Other (non HMO) | Admitting: Cardiology

## 2013-04-16 VITALS — BP 94/58 | HR 55 | Ht 67.0 in | Wt 168.0 lb

## 2013-04-16 DIAGNOSIS — J449 Chronic obstructive pulmonary disease, unspecified: Secondary | ICD-10-CM

## 2013-04-16 DIAGNOSIS — I4719 Other supraventricular tachycardia: Secondary | ICD-10-CM

## 2013-04-16 DIAGNOSIS — I471 Supraventricular tachycardia: Secondary | ICD-10-CM

## 2013-04-16 DIAGNOSIS — I219 Acute myocardial infarction, unspecified: Secondary | ICD-10-CM

## 2013-04-16 DIAGNOSIS — I429 Cardiomyopathy, unspecified: Secondary | ICD-10-CM

## 2013-04-16 DIAGNOSIS — I2510 Atherosclerotic heart disease of native coronary artery without angina pectoris: Secondary | ICD-10-CM

## 2013-04-16 DIAGNOSIS — I513 Intracardiac thrombosis, not elsewhere classified: Secondary | ICD-10-CM

## 2013-04-16 NOTE — Assessment & Plan Note (Signed)
Also PAF. He is on Coumadin with RV thrombus. Medical regimen includes Coreg and digoxin for rate control.

## 2013-04-16 NOTE — Progress Notes (Signed)
Clinical Summary Mr. Carolin CoyRoach is a medically complex 58 y.o.male last seen by Ms. Lawrence NP in November 2014. He continues to follow in our Coumadin clinic. Interval visit in the Pulmonary clinic with Dr. Delton CoombesByrum noted in December 2014. He tells me that things have been fairly stable overall, still uses his oxygen continuously, states his inhalers have been effective, remains on long-term steroids. He reports occasional sense of palpitations but nothing progressive, no angina symptoms.  Awa[facility] for [facility]  History includes CAD with DES to the obtuse marginal at an outside facility, also cardiomyopathy with LVEF 40-45%, and also right ventricular dysfunction with mural thrombus for which we continue Coumadin treatment. From the perspective of arrhythmia, he has also shown evidence of brief atrial fibrillation, and a more regular narrow complex SVT and we are managing medically.  Weight is down 2 pounds from November. He reports consistent diuretic use, has not had to intensify this with any regularity. Recent lab work showed hemoglobin 13.4, platelets 189, potassium 4.3, BUN 44, creatinine 1.5.   Allergies  Allergen Reactions  . Lipitor [Atorvastatin] Other (See Comments)    Muscle spasms     Current Outpatient Prescriptions  Medication Sig Dispense Refill  . acetaminophen (TYLENOL) 500 MG tablet Take 1,000 mg by mouth every 6 (six) hours as needed for moderate pain.       Marland Kitchen. ADVAIR DISKUS 250-50 MCG/DOSE AEPB       . albuterol (PROVENTIL HFA;VENTOLIN HFA) 108 (90 BASE) MCG/ACT inhaler Inhale 2 puffs into the lungs every 6 (six) hours as needed for wheezing or shortness of breath.       Marland Kitchen. antiseptic oral rinse (BIOTENE) LIQD 15 mLs by Mouth Rinse route 4 (four) times daily as needed for dry mouth.      Marland Kitchen. arformoterol (BROVANA) 15 MCG/2ML NEBU Take 15 mcg by nebulization 2 (two) times daily.      . budesonide (PULMICORT) 0.25 MG/2ML nebulizer solution  Take 0.25 mg by nebulization 2 (two) times daily.      . carvedilol (COREG) 25 MG tablet Take 25 mg by mouth 2 (two) times daily with a meal.      . cloNIDine (CATAPRES) 0.1 MG tablet Take 0.1 mg by mouth 3 (three) times daily.      . clotrimazole (MYCELEX) 10 MG troche       . digoxin (LANOXIN) 0.125 MG tablet Take 0.125 mg by mouth every morning.      . furosemide (LASIX) 40 MG tablet Take 40-80 mg by mouth daily. *takes an extra 40mg  tablet if needed for swelling      . ipratropium-albuterol (DUONEB) 0.5-2.5 (3) MG/3ML SOLN Take 3 mLs by nebulization every 6 (six) hours as needed (for shortness of breath).      Marland Kitchen. levalbuterol (XOPENEX) 1.25 MG/0.5ML nebulizer solution Take 1.25 mg by nebulization every 6 (six) hours.  1 each  12  . losartan (COZAAR) 100 MG tablet Take 100 mg by mouth every morning.       . mupirocin nasal ointment (BACTROBAN) 2 % Apply in each nostril bid  10 g  0  . omeprazole (PRILOSEC) 20 MG capsule Take 20 mg by mouth 2 (two) times daily.       . potassium chloride (K-DUR) 10 MEQ tablet Take 10 mEq by mouth every other day.      . prasugrel (EFFIENT) 10 MG TABS Take 10 mg by mouth daily.      . predniSONE (DELTASONE)  10 MG tablet Take 5 mg by mouth daily with breakfast.      . ramipril (ALTACE) 2.5 MG capsule Take 2.5 mg by mouth daily.      . rosuvastatin (CRESTOR) 10 MG tablet Take 10 mg by mouth every evening.      . sodium chloride (OCEAN) 0.65 % nasal spray Place 1 spray into the nose every 6 (six) hours as needed for congestion.      Marland Kitchen warfarin (COUMADIN) 2.5 MG tablet Take 2.5-3.75 mg by mouth daily. Take 1 tablet alternating with 1.5 tablets every other day       No current facility-administered medications for this visit.  [place]  Past Medical History  Diagnosis Date  . GERD (gastroesophageal reflux disease)   . COPD (chronic obstructive pulmonary disease)   . Myocardial infarction 05/02/12  . Chronic systolic heart failure   . Essential hypertension, benign     . Cardiomyopathy     LVEF 40-45%  . Right ventricular mural thrombus     On Coumadin  . Cor pulmonale     Severe RV dysfunction  . Coronary atherosclerosis of native coronary artery     DES to OM [facility]  . Atrial fibrillation   . Spontaneous pneumothorax 2002    bilateral    Social History Mr. Schoenbeck reports that he quit smoking about 19 years ago. His smoking use included Cigarettes. He has a 50 pack-year smoking history. He has never used smokeless tobacco. Mr. Cass reports that he does not drink alcohol.  Review of Systems No fevers or chills. Occasional dizziness, no syncope. Otherwise as outlined above.  Physical Examination Filed Vitals:   04/16/13 1008  BP: 94/58  Pulse: 55   Filed Weights   04/16/13 1008  Weight: 168 lb (76.204 kg)    Wearing oxygen via [place]. Appears comfortable at rest. HEENT: Conjunctiva and lids normal, oropharynx clear.  Neck: Supple, no elevated JVP or carotid bruits, no thyromegaly.  Lungs: Decreased breath sounds, no wheezing, nonlabored breathing at rest.  Cardiac: Regular rate and rhythm with ectopy, indistinct PMI, no S3 or significant systolic murmur, no pericardial rub.  Abdomen: Soft, nontender, bowel sounds present.  Extremities: 1+ edema, distal pulses 2+.  Skin: Warm and dry.  Musculoskeletal: No kyphosis.  Neuropsychiatric: Alert and oriented x3, affect grossly appropriate.   Problem List and Plan   Coronary atherosclerosis of native coronary artery Symptomatically stable, no active angina. He is status post DES to the obtuse marginal at [facility], continues on Effient (no ASA with Coumadin). As of February he will be one year out from his intervention, and regimen can likely be simplified to Coumadin alone.  RV (right ventricular) mural thrombus In association with severe RV dysfunction and COPD. He continues on Coumadin, followed in the Coumadin clinic.  Secondary cardiomyopathy,  unspecified LVEF 40-45%, continue medical therapy.  Atrial tachycardia, paroxysmal Also PAF. He is on Coumadin with RV thrombus. Medical regimen includes Coreg and digoxin for rate control.  COPD (chronic obstructive pulmonary disease) Severe, followed by Dr. Delton Coombes and Dr. Juanetta Gosling. Patient tells me that he has pending assessment at [facility] for possible transplant evaluation.    Jonelle Sidle, M.D., F.A.C.C.

## 2013-04-16 NOTE — Assessment & Plan Note (Signed)
LVEF 40-45%, continue medical therapy.

## 2013-04-16 NOTE — Patient Instructions (Addendum)
Your physician recommends that you schedule a follow-up appointment in: 3 months. Your physician recommends that you continue on your current medications as directed. Please refer to the Current Medication list given to you today. 

## 2013-04-16 NOTE — Assessment & Plan Note (Signed)
Severe, followed by Dr. Delton Coombes and Dr. Juanetta Gosling. Patient tells me that he has pending assessment at [facility] for possible transplant evaluation.

## 2013-04-16 NOTE — Assessment & Plan Note (Addendum)
Symptomatically stable, no active angina. He is status post DES to the obtuse marginal at [facility], continues on Effient (no ASA with Coumadin). As of February he will be one year out from his intervention, and regimen can likely be simplified to Coumadin alone.

## 2013-04-16 NOTE — Assessment & Plan Note (Signed)
In association with severe RV dysfunction and COPD. He continues on Coumadin, followed in the Coumadin clinic.

## 2013-04-17 ENCOUNTER — Telehealth: Payer: Self-pay | Admitting: Emergency Medicine

## 2013-04-17 MED ORDER — PREDNISONE 5 MG PO TABS: 5.0000 mg | ORAL_TABLET | Freq: Every day | ORAL | Status: DC

## 2013-04-17 NOTE — Telephone Encounter (Signed)
Closed message in error. Rx has been sent in. Pt is aware.

## 2013-04-17 NOTE — Telephone Encounter (Signed)
Pt states he is returning someone's call.  Antionette Fairy

## 2013-04-17 NOTE — Addendum Note (Signed)
Addended by: Caryl Ada on: 04/17/2013 03:12 PM   Modules accepted: Orders, Medications

## 2013-04-18 ENCOUNTER — Telehealth: Payer: Self-pay | Admitting: *Deleted

## 2013-04-18 NOTE — Telephone Encounter (Signed)
Pt has been having Palpitations and Pulse has been up and down since last night. Pt states when this usually happens it's viral. Lowest his pulse has been is 44 and pt feels dizzy and sits down. Pt states it stays like this for only a few minutes. The highest his pulse has been is 144 and feels sluggish. Pt is worried that it's going up and down when nothing has been changed.

## 2013-04-18 NOTE — Telephone Encounter (Signed)
This is likely the same process that has been ongoing. He has documented transient episodes of atrial fibrillation as well as narrow complex SVT, likely representing the faster rates. Would continue current regimen for now, he is on good dose of Coreg, digoxin, also on Coumadin. We are limited in terms of additional antiarrhythmic therapies in light of his comorbidities.

## 2013-04-18 NOTE — Telephone Encounter (Signed)
Left message to call back  

## 2013-04-18 NOTE — Telephone Encounter (Signed)
Pt HR is going up and down 110- 44 all over the place. This has been happening all day.

## 2013-04-18 NOTE — Telephone Encounter (Signed)
Made pt aware. Pt states that he does have a burning sensation in his chest. States he feels better when he belches it feels better. Told pt to cal PCP.  While pt is talking to me pulse was 45 with 02 at 89 on 3L. Pt heart rate jump to 102 in about 45 seconds and 02 is 91 on 3L.

## 2013-04-19 ENCOUNTER — Encounter (HOSPITAL_COMMUNITY): Payer: Self-pay | Admitting: Emergency Medicine

## 2013-04-19 ENCOUNTER — Emergency Department (HOSPITAL_COMMUNITY): Payer: Managed Care, Other (non HMO)

## 2013-04-19 ENCOUNTER — Inpatient Hospital Stay (HOSPITAL_COMMUNITY)
Admission: EM | Admit: 2013-04-19 | Discharge: 2013-04-24 | DRG: 246 | Disposition: A | Discharge status: Home / Self Care | Payer: Managed Care, Other (non HMO) | Attending: Cardiovascular Disease | Admitting: Cardiovascular Disease

## 2013-04-19 DIAGNOSIS — I4719 Other supraventricular tachycardia: Secondary | ICD-10-CM

## 2013-04-19 DIAGNOSIS — I272 Pulmonary hypertension, unspecified: Secondary | ICD-10-CM | POA: Diagnosis present

## 2013-04-19 DIAGNOSIS — I2789 Other specified pulmonary heart diseases: Secondary | ICD-10-CM | POA: Diagnosis present

## 2013-04-19 DIAGNOSIS — I200 Unstable angina: Secondary | ICD-10-CM | POA: Diagnosis present

## 2013-04-19 DIAGNOSIS — I429 Cardiomyopathy, unspecified: Secondary | ICD-10-CM

## 2013-04-19 DIAGNOSIS — I509 Heart failure, unspecified: Secondary | ICD-10-CM | POA: Diagnosis present

## 2013-04-19 DIAGNOSIS — I498 Other specified cardiac arrhythmias: Secondary | ICD-10-CM | POA: Diagnosis present

## 2013-04-19 DIAGNOSIS — I2510 Atherosclerotic heart disease of native coronary artery without angina pectoris: Principal | ICD-10-CM | POA: Diagnosis present

## 2013-04-19 DIAGNOSIS — I2589 Other forms of chronic ischemic heart disease: Secondary | ICD-10-CM | POA: Diagnosis present

## 2013-04-19 DIAGNOSIS — I4891 Unspecified atrial fibrillation: Secondary | ICD-10-CM | POA: Diagnosis present

## 2013-04-19 DIAGNOSIS — Z7901 Long term (current) use of anticoagulants: Secondary | ICD-10-CM

## 2013-04-19 DIAGNOSIS — Z87891 Personal history of nicotine dependence: Secondary | ICD-10-CM

## 2013-04-19 DIAGNOSIS — I5043 Acute on chronic combined systolic (congestive) and diastolic (congestive) heart failure: Secondary | ICD-10-CM | POA: Diagnosis present

## 2013-04-19 DIAGNOSIS — I10 Essential (primary) hypertension: Secondary | ICD-10-CM | POA: Diagnosis present

## 2013-04-19 DIAGNOSIS — Z9981 Dependence on supplemental oxygen: Secondary | ICD-10-CM

## 2013-04-19 DIAGNOSIS — J9610 Chronic respiratory failure, unspecified whether with hypoxia or hypercapnia: Secondary | ICD-10-CM | POA: Diagnosis present

## 2013-04-19 DIAGNOSIS — J449 Chronic obstructive pulmonary disease, unspecified: Secondary | ICD-10-CM | POA: Diagnosis present

## 2013-04-19 DIAGNOSIS — I471 Supraventricular tachycardia, unspecified: Secondary | ICD-10-CM | POA: Diagnosis present

## 2013-04-19 DIAGNOSIS — I5189 Other ill-defined heart diseases: Secondary | ICD-10-CM | POA: Diagnosis present

## 2013-04-19 DIAGNOSIS — K219 Gastro-esophageal reflux disease without esophagitis: Secondary | ICD-10-CM | POA: Diagnosis present

## 2013-04-19 DIAGNOSIS — I252 Old myocardial infarction: Secondary | ICD-10-CM

## 2013-04-19 DIAGNOSIS — I513 Intracardiac thrombosis, not elsewhere classified: Secondary | ICD-10-CM | POA: Diagnosis present

## 2013-04-19 DIAGNOSIS — IMO0002 Reserved for concepts with insufficient information to code with codable children: Secondary | ICD-10-CM

## 2013-04-19 HISTORY — DX: Other supraventricular tachycardia: I47.19

## 2013-04-19 HISTORY — DX: Paroxysmal atrial fibrillation: I48.0

## 2013-04-19 HISTORY — DX: Chronic combined systolic (congestive) and diastolic (congestive) heart failure: I50.42

## 2013-04-19 HISTORY — DX: Supraventricular tachycardia: I47.1

## 2013-04-19 LAB — PROTIME-INR
INR: 3.29 — AB (ref 0.00–1.49)
Prothrombin Time: 32.3 seconds — ABNORMAL HIGH (ref 11.6–15.2)

## 2013-04-19 LAB — CBC WITH DIFFERENTIAL/PLATELET
Basophils Absolute: 0 10*3/uL (ref 0.0–0.1)
Basophils Relative: 0 % (ref 0–1)
EOS PCT: 1 % (ref 0–5)
Eosinophils Absolute: 0.1 10*3/uL (ref 0.0–0.7)
HEMATOCRIT: 36.7 % — AB (ref 39.0–52.0)
HEMOGLOBIN: 11.7 g/dL — AB (ref 13.0–17.0)
LYMPHS ABS: 2.7 10*3/uL (ref 0.7–4.0)
LYMPHS PCT: 29 % (ref 12–46)
MCH: 31.2 pg (ref 26.0–34.0)
MCHC: 31.9 g/dL (ref 30.0–36.0)
MCV: 97.9 fL (ref 78.0–100.0)
MONO ABS: 0.9 10*3/uL (ref 0.1–1.0)
MONOS PCT: 9 % (ref 3–12)
Neutro Abs: 5.5 10*3/uL (ref 1.7–7.7)
Neutrophils Relative %: 60 % (ref 43–77)
Platelets: 175 10*3/uL (ref 150–400)
RBC: 3.75 MIL/uL — ABNORMAL LOW (ref 4.22–5.81)
RDW: 14.8 % (ref 11.5–15.5)
WBC: 9.2 10*3/uL (ref 4.0–10.5)

## 2013-04-19 LAB — BASIC METABOLIC PANEL
BUN: 27 mg/dL — ABNORMAL HIGH (ref 6–23)
CALCIUM: 8.8 mg/dL (ref 8.4–10.5)
CO2: 32 meq/L (ref 19–32)
CREATININE: 1.36 mg/dL — AB (ref 0.50–1.35)
Chloride: 101 mEq/L (ref 96–112)
GFR calc Af Amer: 66 mL/min — ABNORMAL LOW (ref >90)
GFR calc non Af Amer: 57 mL/min — ABNORMAL LOW (ref >90)
GLUCOSE: 117 mg/dL — AB (ref 70–99)
Potassium: 3.6 mEq/L — ABNORMAL LOW (ref 3.7–5.3)
Sodium: 143 mEq/L (ref 137–147)

## 2013-04-19 LAB — PRO B NATRIURETIC PEPTIDE: PRO B NATRI PEPTIDE: 2727 pg/mL — AB (ref 0–125)

## 2013-04-19 LAB — POCT I-STAT TROPONIN I: Troponin i, poc: 0.03 ng/mL (ref 0.00–0.08)

## 2013-04-19 LAB — DIGOXIN LEVEL: Digoxin Level: 0.9 ng/mL (ref 0.8–2.0)

## 2013-04-19 MED ORDER — AMIODARONE HCL IN DEXTROSE 360-4.14 MG/200ML-% IV SOLN
60.0000 mg/h | INTRAVENOUS | Status: AC
  Administered 2013-04-19: 60 mg/h via INTRAVENOUS
  Filled 2013-04-19: qty 200

## 2013-04-19 MED ORDER — ALBUTEROL SULFATE HFA 108 (90 BASE) MCG/ACT IN AERS: 2.0000 | INHALATION_SPRAY | Freq: Four times a day (QID) | RESPIRATORY_TRACT | Status: DC | PRN

## 2013-04-19 MED ORDER — LEVALBUTEROL HCL 1.25 MG/0.5ML IN NEBU
1.2500 mg | INHALATION_SOLUTION | Freq: Four times a day (QID) | RESPIRATORY_TRACT | Status: DC
  Administered 2013-04-19: 1.25 mg via RESPIRATORY_TRACT
  Filled 2013-04-19 (×3): qty 0.5

## 2013-04-19 MED ORDER — ASPIRIN EC 81 MG PO TBEC
81.0000 mg | DELAYED_RELEASE_TABLET | Freq: Every day | ORAL | Status: DC
  Administered 2013-04-20 – 2013-04-23 (×4): 81 mg via ORAL
  Filled 2013-04-19 (×5): qty 1

## 2013-04-19 MED ORDER — PANTOPRAZOLE SODIUM 40 MG PO TBEC
40.0000 mg | DELAYED_RELEASE_TABLET | Freq: Every day | ORAL | Status: DC
  Administered 2013-04-20 – 2013-04-23 (×4): 40 mg via ORAL
  Filled 2013-04-19 (×4): qty 1

## 2013-04-19 MED ORDER — BUDESONIDE 0.25 MG/2ML IN SUSP
0.2500 mg | Freq: Two times a day (BID) | RESPIRATORY_TRACT | Status: DC
  Administered 2013-04-19 – 2013-04-24 (×9): 0.25 mg via RESPIRATORY_TRACT
  Filled 2013-04-19 (×14): qty 2

## 2013-04-19 MED ORDER — DIGOXIN 0.25 MG/ML IJ SOLN
0.2500 mg | Freq: Once | INTRAMUSCULAR | Status: DC
  Filled 2013-04-19: qty 1

## 2013-04-19 MED ORDER — AMIODARONE IV BOLUS ONLY 150 MG/100ML
150.0000 mg | Freq: Once | INTRAVENOUS | Status: AC
  Administered 2013-04-19: 150 mg via INTRAVENOUS
  Filled 2013-04-19: qty 100

## 2013-04-19 MED ORDER — RAMIPRIL 2.5 MG PO CAPS
2.5000 mg | ORAL_CAPSULE | Freq: Every day | ORAL | Status: DC
  Administered 2013-04-20 – 2013-04-23 (×3): 2.5 mg via ORAL
  Filled 2013-04-19 (×4): qty 1

## 2013-04-19 MED ORDER — ALBUTEROL SULFATE (2.5 MG/3ML) 0.083% IN NEBU
2.5000 mg | INHALATION_SOLUTION | Freq: Four times a day (QID) | RESPIRATORY_TRACT | Status: DC | PRN
  Administered 2013-04-20: 2.5 mg via RESPIRATORY_TRACT
  Filled 2013-04-19: qty 3

## 2013-04-19 MED ORDER — ARFORMOTEROL TARTRATE 15 MCG/2ML IN NEBU
15.0000 ug | INHALATION_SOLUTION | Freq: Two times a day (BID) | RESPIRATORY_TRACT | Status: DC
  Administered 2013-04-19 – 2013-04-24 (×9): 15 ug via RESPIRATORY_TRACT
  Filled 2013-04-19 (×15): qty 2

## 2013-04-19 MED ORDER — NITROGLYCERIN 2 % TD OINT
1.0000 [in_us] | TOPICAL_OINTMENT | Freq: Once | TRANSDERMAL | Status: AC
  Administered 2013-04-19: 1 [in_us] via TOPICAL
  Filled 2013-04-19: qty 30

## 2013-04-19 MED ORDER — CLONIDINE HCL 0.1 MG PO TABS
0.1000 mg | ORAL_TABLET | Freq: Three times a day (TID) | ORAL | Status: DC
  Administered 2013-04-19 – 2013-04-24 (×12): 0.1 mg via ORAL
  Filled 2013-04-19 (×19): qty 1

## 2013-04-19 MED ORDER — ROSUVASTATIN CALCIUM 10 MG PO TABS
10.0000 mg | ORAL_TABLET | Freq: Every day | ORAL | Status: DC
  Administered 2013-04-20 – 2013-04-24 (×5): 10 mg via ORAL
  Filled 2013-04-19 (×5): qty 1

## 2013-04-19 MED ORDER — AMIODARONE HCL 200 MG PO TABS
200.0000 mg | ORAL_TABLET | Freq: Two times a day (BID) | ORAL | Status: DC
  Administered 2013-04-20 – 2013-04-24 (×9): 200 mg via ORAL
  Filled 2013-04-19 (×11): qty 1

## 2013-04-19 MED ORDER — NON FORMULARY: 10.0000 mg | Freq: Every day | Status: DC

## 2013-04-19 MED ORDER — CLOTRIMAZOLE 10 MG MT LOZG
10.0000 mg | LOZENGE | Freq: Every day | OROMUCOSAL | Status: DC
  Administered 2013-04-20 – 2013-04-23 (×4): 10 mg via ORAL
  Filled 2013-04-19 (×5): qty 1

## 2013-04-19 MED ORDER — LOSARTAN POTASSIUM 50 MG PO TABS
100.0000 mg | ORAL_TABLET | Freq: Every day | ORAL | Status: DC
  Administered 2013-04-20 – 2013-04-24 (×4): 100 mg via ORAL
  Filled 2013-04-19 (×5): qty 2

## 2013-04-19 MED ORDER — DIGOXIN 125 MCG PO TABS
0.1250 mg | ORAL_TABLET | Freq: Every morning | ORAL | Status: DC
  Administered 2013-04-20 – 2013-04-24 (×4): 0.125 mg via ORAL
  Filled 2013-04-19 (×5): qty 1

## 2013-04-19 MED ORDER — PREDNISONE 5 MG PO TABS
5.0000 mg | ORAL_TABLET | Freq: Every day | ORAL | Status: DC
  Administered 2013-04-20 – 2013-04-24 (×5): 5 mg via ORAL
  Filled 2013-04-19 (×7): qty 1

## 2013-04-19 MED ORDER — FUROSEMIDE 40 MG PO TABS
40.0000 mg | ORAL_TABLET | Freq: Every day | ORAL | Status: DC
  Administered 2013-04-20: 40 mg via ORAL
  Filled 2013-04-19: qty 2

## 2013-04-19 MED ORDER — AMIODARONE HCL 200 MG PO TABS: 200.0000 mg | ORAL_TABLET | Freq: Every day | ORAL | Status: DC

## 2013-04-19 MED ORDER — NITROGLYCERIN 0.4 MG SL SUBL: 0.4000 mg | SUBLINGUAL_TABLET | SUBLINGUAL | Status: DC | PRN

## 2013-04-19 MED ORDER — PRASUGREL HCL 10 MG PO TABS
10.0000 mg | ORAL_TABLET | Freq: Every day | ORAL | Status: DC
  Administered 2013-04-20 – 2013-04-23 (×4): 10 mg via ORAL
  Filled 2013-04-19 (×4): qty 1

## 2013-04-19 MED ORDER — CARVEDILOL 25 MG PO TABS
25.0000 mg | ORAL_TABLET | Freq: Two times a day (BID) | ORAL | Status: DC
  Administered 2013-04-20 – 2013-04-21 (×3): 25 mg via ORAL
  Filled 2013-04-19 (×5): qty 1

## 2013-04-19 MED ORDER — DIGOXIN 0.25 MG/ML IJ SOLN
0.1250 mg | Freq: Once | INTRAMUSCULAR | Status: AC
  Administered 2013-04-19: 0.125 mg via INTRAVENOUS
  Filled 2013-04-19: qty 0.5

## 2013-04-19 MED ORDER — POTASSIUM CHLORIDE ER 10 MEQ PO TBCR
10.0000 meq | EXTENDED_RELEASE_TABLET | ORAL | Status: DC
  Administered 2013-04-20: 10 meq via ORAL
  Filled 2013-04-19 (×2): qty 1

## 2013-04-19 MED ORDER — ACETAMINOPHEN 325 MG PO TABS
650.0000 mg | ORAL_TABLET | ORAL | Status: DC | PRN
  Administered 2013-04-19 – 2013-04-23 (×6): 650 mg via ORAL
  Filled 2013-04-19 (×6): qty 2

## 2013-04-19 MED ORDER — AMIODARONE HCL IN DEXTROSE 360-4.14 MG/200ML-% IV SOLN
60.0000 mg/h | INTRAVENOUS | Status: DC
  Filled 2013-04-19: qty 200

## 2013-04-19 MED ORDER — ONDANSETRON HCL 4 MG/2ML IJ SOLN: 4.0000 mg | Freq: Four times a day (QID) | INTRAMUSCULAR | Status: DC | PRN

## 2013-04-19 MED ORDER — AMIODARONE LOAD VIA INFUSION
150.0000 mg | Freq: Once | INTRAVENOUS | Status: DC
  Filled 2013-04-19: qty 83.34

## 2013-04-19 MED ORDER — AMIODARONE HCL IN DEXTROSE 360-4.14 MG/200ML-% IV SOLN: 30.0000 mg/h | INTRAVENOUS | Status: DC

## 2013-04-19 MED ORDER — AMIODARONE HCL IN DEXTROSE 360-4.14 MG/200ML-% IV SOLN
30.0000 mg/h | INTRAVENOUS | Status: AC
  Administered 2013-04-19 – 2013-04-20 (×2): 30 mg/h via INTRAVENOUS
  Filled 2013-04-19 (×4): qty 200

## 2013-04-19 MED ORDER — DIGOXIN 0.1 MG/ML IJ SOLN: 0.2500 mg | Freq: Once | INTRAMUSCULAR | Status: DC

## 2013-04-19 NOTE — ED Notes (Signed)
Pt reports chest tightness 4/10 pain, provider notified.

## 2013-04-19 NOTE — H&P (Signed)
Chief Complaint:  Chest tightness, palpitations, dyspnea  Cardiologist: Domenic Polite  HPI:  This is a 58 y.o. male with a past medical history significant for severe oxygen dependent lung disease, right heart failure, RV thrombus on warfarin, CAD (approximately 1 year s/p drug eluting stent to OM), mild ischemic cardiomyopathy EF 40-45%, severe HTN, history of paroxysmal atrial fibrillation and paroxysmal atrial tachycardia.  He presents with complaints of a squeezing/twisting retrosternal discomfort associated with dyspnea, as well as increasing palpitations. Tachyarrhythmia has been challenging to manage in the past, but chest tightness appears to be a newly associated complaint.  Initial management in the ED was difficult due to low BP and digoxin was administered with little benefit. Some reduction in arrhythmia burden with iv amiodarone.  PMHx:  Past Medical History  Diagnosis Date  . GERD (gastroesophageal reflux disease)   . COPD (chronic obstructive pulmonary disease)   . Myocardial infarction 05/02/12  . Chronic systolic heart failure   . Essential hypertension, benign   . Cardiomyopathy     LVEF 40-45%  . Right ventricular mural thrombus     On Coumadin  . Cor pulmonale     Severe RV dysfunction  . Coronary atherosclerosis of native coronary artery     DES to OM [facility]  . Atrial fibrillation   . Spontaneous pneumothorax 2002    Bilateral    Past Surgical History  Procedure Laterality Date  . 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 Coronary angioplasty with stent placement  04/2012    in [place], [place]    FAMHx:  No family history on file.  SOCHx:   reports that he quit smoking about 19 years ago. His smoking use included Cigarettes. He has a 50 pack-year smoking history. He has never used smokeless tobacco. He reports that he does not drink alcohol or use illicit drugs.  ALLERGIES:  Allergies  Allergen Reactions  . Lipitor  [Atorvastatin] Other (See Comments)    Muscle spasms     ROS: Constitutional: positive for fatigue, negative for chills, fevers and sweats Eyes: negative Ears, nose, mouth, throat, and face: negative Respiratory: positive for chronic bronchitis, cough, dyspnea on exertion and wheezing, negative for hemoptysis and sputum Cardiovascular: positive for chest pain and lower extremity edema, negative for claudication, paroxysmal nocturnal dyspnea and syncope Gastrointestinal: negative for abdominal pain, melena, nausea and vomiting Genitourinary:negative Integument/breast: negative Hematologic/lymphatic: negative for bleeding, easy bruising and petechiae Musculoskeletal:negative except for arthralgias Neurological: negative for gait problems, headaches, memory problems, seizures, speech problems and weakness Behavioral/Psych: negative Endocrine: negative for diabetic symptoms including polydipsia, polyphagia and polyuria Allergic/Immunologic: negative  HOME MEDS:  (Not in a hospital admission)  LABS/IMAGING: Results for orders placed during the hospital encounter of 04/19/13 (from the past 48 hour(s))  PRO B NATRIURETIC PEPTIDE     Status: Abnormal   Collection Time    04/19/13  1:32 PM      Result Value Range   Pro B Natriuretic peptide (BNP) 2727.0 (*) 0 - 125 pg/mL  CBC WITH DIFFERENTIAL     Status: Abnormal   Collection Time    04/19/13  1:45 PM      Result Value Range   WBC 9.2  4.0 - 10.5 K/uL   RBC 3.75 (*) 4.22 - 5.81 MIL/uL   Hemoglobin 11.7 (*) 13.0 - 17.0 g/dL   HCT 36.7 (*) 39.0 - 52.0 %   MCV 97.9  78.0 - 100.0 fL   MCH 31.2  26.0 - 34.0 pg   MCHC 31.9  30.0 - 36.0 g/dL   RDW 14.8  11.5 - 15.5 %   Platelets 175  150 - 400 K/uL   Neutrophils Relative % 60  43 - 77 %   Neutro Abs 5.5  1.7 - 7.7 K/uL   Lymphocytes Relative 29  12 - 46 %   Lymphs Abs 2.7  0.7 - 4.0 K/uL   Monocytes Relative 9  3 - 12 %   Monocytes Absolute 0.9  0.1 - 1.0 K/uL   Eosinophils  Relative 1  0 - 5 %   Eosinophils Absolute 0.1  0.0 - 0.7 K/uL   Basophils Relative 0  0 - 1 %   Basophils Absolute 0.0  0.0 - 0.1 K/uL  BASIC METABOLIC PANEL     Status: Abnormal   Collection Time    04/19/13  1:45 PM      Result Value Range   Sodium 143  137 - 147 mEq/L   Potassium 3.6 (*) 3.7 - 5.3 mEq/L   Chloride 101  96 - 112 mEq/L   CO2 32  19 - 32 mEq/L   Glucose, Bld 117 (*) 70 - 99 mg/dL   BUN 27 (*) 6 - 23 mg/dL   Creatinine, Ser 1.36 (*) 0.50 - 1.35 mg/dL   Calcium 8.8  8.4 - 10.5 mg/dL   GFR calc non Af Amer 57 (*) >90 mL/min   GFR calc Af Amer 66 (*) >90 mL/min   Comment: (NOTE)     The eGFR has been calculated using the CKD EPI equation.     This calculation has not been validated in all clinical situations.     eGFR's persistently <90 mL/min signify possible Chronic Kidney     Disease.  DIGOXIN LEVEL     Status: None   Collection Time    04/19/13  1:45 PM      Result Value Range   Digoxin Level 0.9  0.8 - 2.0 ng/mL  POCT I-STAT TROPONIN I     Status: None   Collection Time    04/19/13  1:49 PM      Result Value Range   Troponin i, poc 0.03  0.00 - 0.08 ng/mL   Comment 3            Comment: Due to the release kinetics of cTnI,     a negative result within the first hours     of the onset of symptoms does not rule out     myocardial infarction with certainty.     If myocardial infarction is still suspected,     repeat the test at appropriate intervals.  PROTIME-INR     Status: Abnormal   Collection Time    04/19/13  3:35 PM      Result Value Range   Prothrombin Time 32.3 (*) 11.6 - 15.2 seconds   INR 3.29 (*) 0.00 - 1.49   Dg Chest 2 View  04/19/2013   CLINICAL DATA:  Dyspnea and weakness with history of COPD, on waiting list for lung transplant.  EXAM: CHEST  2 VIEW  COMPARISON:  Portable chest x-ray March 29, 2013.  FINDINGS: The lungs are borderline hypoinflated. This appears stable. The left hemidiaphragm is higher than the right which is also  stable. There are coarse increased lung markings bilaterally with areas of near confluence. These are stable. The cardiac silhouette is top-normal in size. The pulmonary vascularity is not engorged. There is no  pleural effusion or pneumothorax. The mediastinum is normal in width. The observed portions of the bony thorax appear normal.  IMPRESSION: There has not been significant interval change since the study of 29 March 2013. Bilateral pleural and parenchymal scarring is suspected. There is no pneumothorax. An element of low grade CHF may be present.   Electronically Signed   By: David  Martinique   On: 04/19/2013 14:13    VITALS: Blood pressure 116/75, pulse 99, temperature 97.8 F (36.6 C), temperature source Oral, resp. rate 31, SpO2 91.00%.  EXAM: General appearance: alert, cooperative and mild distress Neck: no adenopathy, no carotid bruit, no JVD, supple, symmetrical, trachea midline and thyroid not enlarged, symmetric, no tenderness/mass/nodules Lungs: diminished breath sounds bilaterally and emphysematous chest Heart: irregularly irregular rhythm, S1: normal, S2: increased P2 and no rub Abdomen: soft, non-tender; bowel sounds normal; no masses,  no organomegaly Extremities: edema 2-3+ pretibial Pulses: 2+ and symmetric Skin: Skin color, texture, turgor normal. No rashes or lesions Neurologic: Alert and oriented X 3, normal strength and tone. Normal symmetric reflexes. Normal coordination and gait  IMPRESSION: 1. Chest pain at rest, possible unstable angina, consider in-stent restenosis 12 months from drug eluting stent to OM  2. Severe chronic respiratory failure with cor pulmonale and right heart failure; chronic steroid therapy; preparing for transplant evaluation  3. On warfarin for RV thrombus  4. Recurrent PAT and very frequent PACs/atrial couplets. There seems to be a dominant P wave, so technically not MAT, but behaving as such  It is unclear if angina is exacerbated by  tachycardia induced ischemia or if arrhythmia may be triggered by new ischemia  PLAN: Amiodarone IV Hold warfarin for anticipated left heart cath/coronary angiogram No good noninvasive alternative for CAD evaluation in view of severity of lung disease and arrhythmia Appreciate Dr. Myles Gip help with the decision process.  Sanda Klein, MD  04/19/2013, 6:25 PM

## 2013-04-19 NOTE — ED Notes (Signed)
Per EMS pt SOB for 2 days with hx of COPD and Afib. Pt currently in Afib per EMS with O2 sat on RA on arrival at pt's home of 79. On arrival pt placed on 3 lpm [place] sat on 89%. Pt currently on 4 lpm [place] sat of 92%. Duo neb given by EMS en route.

## 2013-04-19 NOTE — ED Notes (Addendum)
Pharmacy messaged at [phone] regarding sending loading dose of amiodarone. Dose not present, pharmacy verbalized will send.

## 2013-04-19 NOTE — ED Notes (Signed)
Pharmacy sending filter to tube station.

## 2013-04-19 NOTE — ED Provider Notes (Signed)
CSN: [id number]     Arrival date & time 04/19/13  1303 History   First MD Initiated Contact with Patient 04/19/13 1310     Chief Complaint  Patient presents with  . Shortness of Breath    HPI  Patient presents with main complaint of palpitations and weakness and dizziness. He is competent history including COPD, cor pulmonale, multifocal atrial tachycardia, paroxysmal A. Fib. His mural and right ventricular thrombus. He is chronically anticoagulated. He takes Lanoxin and Coreg for rate control. He states he feels a little short of breath.  His primary complaint is that of weakness. He states he was walking to his cardiologist's office 2 days ago and only walk a few steps at a time. His pressure there he states was "a little ". He is chronically on 3 L nasal cannula at home. On 3 L on my initial evaluation his pulse ox is 92%.  Past Medical History  Diagnosis Date  . GERD (gastroesophageal reflux disease)   . COPD (chronic obstructive pulmonary disease)   . Myocardial infarction 05/02/12  . Chronic systolic heart failure   . Essential hypertension, benign   . Cardiomyopathy     LVEF 40-45%  . Right ventricular mural thrombus     On Coumadin  . Cor pulmonale     Severe RV dysfunction  . Coronary atherosclerosis of native coronary artery     DES to [facility]  . Atrial fibrillation   . Spontaneous pneumothorax 2002    bilateral   Past Surgical History  Procedure Laterality Date  . 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 Coronary angioplasty with stent placement  04/2012    in [place], [place]   No family history on file. History  Substance Use Topics  . Smoking status: Former Smoker -- 2.00 packs/day for 25 years    Types: Cigarettes    Quit date: 04/03/1994  . Smokeless tobacco: Never Used  . Alcohol Use: No    Review of Systems  Constitutional: Positive for fatigue. Negative for fever, chills, diaphoresis and appetite change.  HENT:  Negative for mouth sores, sore throat and trouble swallowing.   Eyes: Negative for visual disturbance.  Respiratory: Positive for shortness of breath. Negative for cough, chest tightness and wheezing.   Cardiovascular: Positive for palpitations. Negative for chest pain.  Gastrointestinal: Negative for nausea, vomiting, abdominal pain, diarrhea and abdominal distention.  Endocrine: Negative for polydipsia, polyphagia and polyuria.  Genitourinary: Negative for dysuria, frequency and hematuria.  Musculoskeletal: Negative for gait problem.  Skin: Negative for color change, pallor and rash.  Neurological: Positive for weakness. Negative for dizziness, syncope, light-headedness and headaches.  Hematological: Does not bruise/bleed easily.  Psychiatric/Behavioral: Negative for behavioral problems and confusion.    Allergies  Lipitor  Home Medications   Current Outpatient Rx  Name  Route  Sig  Dispense  Refill  . acetaminophen (TYLENOL) 500 MG tablet   Oral   Take 1,000 mg by mouth every 6 (six) hours as needed for moderate pain.          Marland Kitchen albuterol (PROVENTIL HFA;VENTOLIN HFA) 108 (90 BASE) MCG/ACT inhaler   Inhalation   Inhale 2 puffs into the lungs every 6 (six) hours as needed for wheezing or shortness of breath.          Marland Kitchen antiseptic oral rinse (BIOTENE) LIQD   Mouth Rinse   15 mLs by Mouth Rinse route 4 (four) times daily as needed for dry  mouth.         . arformoterol (BROVANA) 15 MCG/2ML NEBU   Nebulization   Take 15 mcg by nebulization 2 (two) times daily.         . budesonide (PULMICORT) 0.25 MG/2ML nebulizer solution   Nebulization   Take 0.25 mg by nebulization 2 (two) times daily.         . carvedilol (COREG) 25 MG tablet   Oral   Take 25 mg by mouth 2 (two) times daily with a meal.         . cloNIDine (CATAPRES) 0.1 MG tablet   Oral   Take 0.1 mg by mouth 3 (three) times daily.         . clotrimazole (MYCELEX) 10 MG troche   Oral   Take 10 mg by  mouth daily.          . digoxin (LANOXIN) 0.125 MG tablet   Oral   Take 0.125 mg by mouth every morning.         . furosemide (LASIX) 40 MG tablet   Oral   Take 40-80 mg by mouth daily. *takes an extra 40mg  tablet if needed for swelling         . levalbuterol (XOPENEX) 1.25 MG/0.5ML nebulizer solution   Nebulization   Take 1.25 mg by nebulization every 6 (six) hours.   1 each   12   . losartan (COZAAR) 100 MG tablet   Oral   Take 100 mg by mouth every morning.          . mupirocin nasal ointment (BACTROBAN) 2 %      Apply in each nostril bid   10 g   0   . omeprazole (PRILOSEC) 20 MG capsule   Oral   Take 20 mg by mouth 2 (two) times daily.          . potassium chloride (K-DUR) 10 MEQ tablet   Oral   Take 10 mEq by mouth every other day.         . prasugrel (EFFIENT) 10 MG TABS   Oral   Take 10 mg by mouth daily.         . predniSONE (DELTASONE) 5 MG tablet   Oral   Take 1 tablet (5 mg total) by mouth daily with breakfast.   90 tablet   1   . ramipril (ALTACE) 2.5 MG capsule   Oral   Take 2.5 mg by mouth daily.         . rosuvastatin (CRESTOR) 10 MG tablet   Oral   Take 10 mg by mouth every evening.         . sodium chloride (OCEAN) 0.65 % nasal spray   Nasal   Place 1 spray into the nose every 6 (six) hours as needed for congestion.         Marland Kitchen. warfarin (COUMADIN) 2.5 MG tablet   Oral   Take 2.5-3.75 mg by mouth daily. Take 2.5 mg tablet alternating with 3.75 mg  tablets every other day          BP 108/61  Pulse 40  Temp(Src) 97.8 F (36.6 C) (Oral)  Resp 17  SpO2 91% Physical Exam  Constitutional: He is oriented to person, place, and time. He appears well-developed and well-nourished. No distress.  Pleasant conversant male. He appears in no distress.  HENT:  Head: Normocephalic.  Eyes: Conjunctivae are normal. Pupils are equal, round, and reactive to light. No scleral icterus.  Neck: Normal range of motion. Neck supple. No  thyromegaly present.  Cardiovascular: An irregular rhythm present. Tachycardia present.  Exam reveals no gallop and no friction rub.   No murmur heard. Tachycardic. MAT on the monitor. Rate of 140  Pulmonary/Chest: Effort normal and breath sounds normal. No respiratory distress. He has no wheezes. He has no rales.  Minimal diminished basilar breath sounds. No wheezing or prolongation.  Abdominal: Soft. Bowel sounds are normal. He exhibits no distension. There is no tenderness. There is no rebound.  Musculoskeletal: Normal range of motion.  Neurological: He is alert and oriented to person, place, and time.  Skin: Skin is warm and dry. No rash noted.  Trace symmetric lower extremity edema  Psychiatric: He has a normal mood and affect. His behavior is normal.    ED Course  Procedures (including critical care time) Labs Review Labs Reviewed  CBC WITH DIFFERENTIAL - Abnormal; Notable for the following:    RBC 3.75 (*)    Hemoglobin 11.7 (*)    HCT 36.7 (*)    All other components within normal limits  BASIC METABOLIC PANEL - Abnormal; Notable for the following:    Potassium 3.6 (*)    Glucose, Bld 117 (*)    BUN 27 (*)    Creatinine, Ser 1.36 (*)    GFR calc non Af Amer 57 (*)    GFR calc Af Amer 66 (*)    All other components within normal limits  PRO B NATRIURETIC PEPTIDE - Abnormal; Notable for the following:    Pro B Natriuretic peptide (BNP) 2727.0 (*)    All other components within normal limits  PROTIME-INR - Abnormal; Notable for the following:    Prothrombin Time 32.3 (*)    INR 3.29 (*)    All other components within normal limits  DIGOXIN LEVEL  POCT I-STAT TROPONIN I   Imaging Review Dg Chest 2 View  04/19/2013   CLINICAL DATA:  Dyspnea and weakness with history of COPD, on waiting list for lung transplant.  EXAM: CHEST  2 VIEW  COMPARISON:  Portable chest x-ray March 29, 2013.  FINDINGS: The lungs are borderline hypoinflated. This appears stable. The left  hemidiaphragm is higher than the right which is also stable. There are coarse increased lung markings bilaterally with areas of near confluence. These are stable. The cardiac silhouette is top-normal in size. The pulmonary vascularity is not engorged. There is no pleural effusion or pneumothorax. The mediastinum is normal in width. The observed portions of the bony thorax appear normal.  IMPRESSION: There has not been significant interval change since the study of 29 March 2013. Bilateral pleural and parenchymal scarring is suspected. There is no pneumothorax. An element of low grade CHF may be present.   Electronically Signed   By: David  [place]   On: 04/19/2013 14:13    EKG Interpretation    Date/Time:  Saturday April 19 2013 13:13:34 EST Ventricular Rate:  123 PR Interval:    QRS Duration: 96 QT Interval:  309 QTC Calculation: 442 R Axis:   168 Text Interpretation:  Multifocal atrial tachycardia Confirmed by Fayrene Fearing  MD, Deandra Goering ([id number]) on 04/19/2013 3:52:35 PM            MDM   1. Multifocal atrial tachycardia    Patient given IV dig 0.0125. Given a second dose 0.25. Heart rate still 1:30. MAT was still on the monitor. Pressures as low as 88. I as 102. Still symptomatic. I discussed with Dr.Croitoru or  [facility].  He recommends amiodarone load, and confusion. Has been ordered. Request infusion to begin slowly. Her request is over 30 minutes. On recheck the patient's heart rate is 129. Systolic blood pressure 94. He is awake alert and mentating well. Saturation 90% on his home 3 L.    Rolland Porter, MD 04/19/13 1620

## 2013-04-19 NOTE — ED Notes (Signed)
Pt placed on 3 lpm [place] by EDP, Fayrene Fearing.

## 2013-04-19 NOTE — Progress Notes (Addendum)
  Amiodarone Drug - Drug Interaction Consult Note  Recommendations: Digoxin level currently at low end of goal, monitor closely given interaction between amio and dig.  Will also require close monitoring of INR and likely dose adjustment when Coumadin resumed.   Amiodarone is metabolized by the cytochrome P450 system and therefore has the potential to cause many drug interactions. Amiodarone has an average plasma half-life of 50 days (range 20 to 100 days).   There is potential for drug interactions to occur several weeks or months after stopping treatment and the onset of drug interactions may be slow after initiating amiodarone.   [x]  Statins: Increased risk of myopathy. Simvastatin- restrict dose to 20mg  daily. Other statins: counsel patients to report any muscle pain or weakness immediately.  [x]  Anticoagulants: Amiodarone can increase anticoagulant effect. Consider warfarin dose reduction. Patients should be monitored closely and the dose of anticoagulant altered accordingly, remembering that amiodarone levels take several weeks to stabilize.  []  Antiepileptics: Amiodarone can increase plasma concentration of phenytoin, the dose should be reduced. Note that small changes in phenytoin dose can result in large changes in levels. Monitor patient and counsel on signs of toxicity.  []  Beta blockers: increased risk of bradycardia, AV block and myocardial depression. Sotalol - avoid concomitant use.  []   Calcium channel blockers (diltiazem and verapamil): increased risk of bradycardia, AV block and myocardial depression.  []   Cyclosporine: Amiodarone increases levels of cyclosporine. Reduced dose of cyclosporine is recommended.  [x]  Digoxin dose should be halved when amiodarone is started.  []  Diuretics: increased risk of cardiotoxicity if hypokalemia occurs.  []  Oral hypoglycemic agents (glyburide, glipizide, glimepiride): increased risk of hypoglycemia. Patient's glucose levels should be  monitored closely when initiating amiodarone therapy.   []  Drugs that prolong the QT interval:  Torsades de pointes risk may be increased with concurrent use - avoid if possible.  Monitor QTc, also keep magnesium/potassium WNL if concurrent therapy can't be avoided. Marland Kitchen Antibiotics: e.g. fluoroquinolones, erythromycin. . Antiarrhythmics: e.g. quinidine, procainamide, disopyramide, sotalol. . Antipsychotics: e.g. phenothiazines, haloperidol.  . Lithium, tricyclic antidepressants, and methadone.  Thank You,  Vernard Gambles, PharmD, BCPS  04/19/2013 10:37 PM

## 2013-04-19 NOTE — ED Provider Notes (Signed)
Care received from Dr. Fayrene Fearing 4:16 PM Patient with new onset MAT.  Patient received iv dig and amiodarone now being given.  Cardiology consulted and coming to see. Patient seen by Dr.Croituro and nurse reports being transferred to [facility].   Hilario Quarry, MD 04/19/13 [phone]

## 2013-04-20 DIAGNOSIS — J449 Chronic obstructive pulmonary disease, unspecified: Secondary | ICD-10-CM

## 2013-04-20 DIAGNOSIS — I2789 Other specified pulmonary heart diseases: Secondary | ICD-10-CM

## 2013-04-20 DIAGNOSIS — I219 Acute myocardial infarction, unspecified: Secondary | ICD-10-CM

## 2013-04-20 LAB — PROTIME-INR
INR: 2.32 — ABNORMAL HIGH (ref 0.00–1.49)
Prothrombin Time: 24.7 seconds — ABNORMAL HIGH (ref 11.6–15.2)

## 2013-04-20 LAB — BASIC METABOLIC PANEL
BUN: 28 mg/dL — ABNORMAL HIGH (ref 6–23)
CHLORIDE: 100 meq/L (ref 96–112)
CO2: 28 mEq/L (ref 19–32)
Calcium: 8.9 mg/dL (ref 8.4–10.5)
Creatinine, Ser: 1.34 mg/dL (ref 0.50–1.35)
GFR calc Af Amer: 67 mL/min — ABNORMAL LOW (ref >90)
GFR calc non Af Amer: 58 mL/min — ABNORMAL LOW (ref >90)
Glucose, Bld: 210 mg/dL — ABNORMAL HIGH (ref 70–99)
POTASSIUM: 3.4 meq/L — AB (ref 3.7–5.3)
Sodium: 142 mEq/L (ref 137–147)

## 2013-04-20 LAB — LIPID PANEL
CHOL/HDL RATIO: 3.6 ratio
CHOLESTEROL: 115 mg/dL (ref 0–200)
HDL: 32 mg/dL — ABNORMAL LOW (ref >39)
LDL Cholesterol: 59 mg/dL (ref 0–99)
Triglycerides: 119 mg/dL (ref <150)
VLDL: 24 mg/dL (ref 0–40)

## 2013-04-20 LAB — TSH: TSH: 0.076 u[IU]/mL — ABNORMAL LOW (ref 0.350–4.500)

## 2013-04-20 LAB — TROPONIN I
Troponin I: <0.3 ng/mL (ref <0.30)
Troponin I: <0.3 ng/mL (ref <0.30)
Troponin I: <0.3 ng/mL (ref <0.30)

## 2013-04-20 MED ORDER — LEVALBUTEROL HCL 1.25 MG/0.5ML IN NEBU
1.2500 mg | INHALATION_SOLUTION | Freq: Four times a day (QID) | RESPIRATORY_TRACT | Status: DC
  Administered 2013-04-20: 1.25 mg via RESPIRATORY_TRACT
  Filled 2013-04-20 (×6): qty 0.5

## 2013-04-20 MED ORDER — LEVALBUTEROL HCL 0.63 MG/3ML IN NEBU
0.6300 mg | INHALATION_SOLUTION | Freq: Four times a day (QID) | RESPIRATORY_TRACT | Status: DC | PRN
  Administered 2013-04-20 – 2013-04-22 (×3): 0.63 mg via RESPIRATORY_TRACT
  Filled 2013-04-20 (×2): qty 3

## 2013-04-20 MED ORDER — ALPRAZOLAM 0.5 MG PO TABS: 0.5000 mg | ORAL_TABLET | Freq: Once | ORAL | Status: DC

## 2013-04-20 MED ORDER — POTASSIUM CHLORIDE CRYS ER 20 MEQ PO TBCR
20.0000 meq | EXTENDED_RELEASE_TABLET | Freq: Once | ORAL | Status: AC
  Administered 2013-04-20: 20 meq via ORAL
  Filled 2013-04-20: qty 1

## 2013-04-20 MED ORDER — LEVALBUTEROL HCL 1.25 MG/0.5ML IN NEBU
0.6000 mg | INHALATION_SOLUTION | Freq: Four times a day (QID) | RESPIRATORY_TRACT | Status: DC | PRN
  Filled 2013-04-20: qty 0.24

## 2013-04-20 MED ORDER — FUROSEMIDE 40 MG PO TABS
40.0000 mg | ORAL_TABLET | Freq: Every day | ORAL | Status: DC
  Administered 2013-04-21: 40 mg via ORAL
  Filled 2013-04-20 (×2): qty 1

## 2013-04-20 MED ORDER — FUROSEMIDE 10 MG/ML IJ SOLN
20.0000 mg | Freq: Once | INTRAMUSCULAR | Status: AC
  Administered 2013-04-20: 20 mg via INTRAVENOUS
  Filled 2013-04-20: qty 2

## 2013-04-20 MED ORDER — LEVALBUTEROL HCL 0.63 MG/3ML IN NEBU
INHALATION_SOLUTION | RESPIRATORY_TRACT | Status: AC
  Filled 2013-04-20: qty 3

## 2013-04-20 NOTE — Progress Notes (Signed)
Patient complaining of shortness of breath despite breathing treatment. Patient on O2 support at 3-4 lpm via nasal cannula.  O2 sats ranging from 85-90%.  Breath sounds diminished all over. Urine output marginal.  Notified provider on call.  One time order of Lasix 20 mg IV ordered.  Will evaluate.

## 2013-04-20 NOTE — Progress Notes (Signed)
Per Dr Verdie Mosher, cardiovascular on call, Amiodarone drip 0.5mg /min should be run for 18H, amiodarone 200 mg tablet to be started at 1459.

## 2013-04-20 NOTE — Progress Notes (Addendum)
Primary cardiologist: Dr. Jonelle SidleSamuel G. Jakyiah Briones  Subjective:    Patient says he had a rough night. Complains more of a soreness in his chest when he coughs. Less sense of palpitations.  Objective:   Temp:  [97.8 F (36.6 C)-98.9 F (37.2 C)] 98.3 F (36.8 C) (01/18 0418) Pulse Rate:  [40-127] 77 (01/18 0418) Resp:  [17-31] 22 (01/18 0418) BP: (94-133)/(43-87) 128/87 mmHg (01/18 0418) SpO2:  [79 %-98 %] 90 % (01/18 0842) Weight:  [166 lb 12.8 oz (75.66 kg)] 166 lb 12.8 oz (75.66 kg) (01/17 2214) Last BM Date: 04/20/13  Filed Weights   04/19/13 2214  Weight: 166 lb 12.8 oz (75.66 kg)    Intake/Output Summary (Last 24 hours) at 04/20/13 0855 Last data filed at 04/20/13 0649  Gross per 24 hour  Intake 874.29 ml  Output    750 ml  Net 124.29 ml    Telemetry: Sinus rhythm this morning, frequent atrial ectopy and bursts of SVT overnight.   Exam:  General: Chronically ill-appearing, no distress.  Lungs: Decreased breath sounds throughout.  Cardiac: RRR with occasional ectopy.  Abdomen: Nontender.  Extremities: Edema present.   Lab Results:  Basic Metabolic Panel:  Recent Labs Lab 04/19/13 1345 04/20/13 0025  NA 143 142  K 3.6* 3.4*  CL 101 100  CO2 32 28  GLUCOSE 117* 210*  BUN 27* 28*  CREATININE 1.36* 1.34  CALCIUM 8.8 8.9    CBC:  Recent Labs Lab 04/14/13 0057 04/19/13 1345  WBC 13.4* 9.2  HGB 13.4 11.7*  HCT 40.6 36.7*  MCV 98.1 97.9  PLT 189 175    Cardiac Enzymes:  Recent Labs Lab 04/20/13 0025 04/20/13 0501  TROPONINI <0.30 <0.30    BNP:  Recent Labs  03/17/13 1800 03/29/13 1220 04/19/13 1332  PROBNP 2269.0* 6129.0* 2727.0*    Coagulation:  Recent Labs Lab 04/14/13 0057 04/19/13 1535 04/20/13 0501  INR 2.24* 3.29* 2.32*     Medications:   Scheduled Medications: . ALPRAZolam  0.5 mg Oral Once  . amiodarone  200 mg Oral Q12H   Followed by  . [START ON 04/27/2013] amiodarone  200 mg Oral Daily  .  arformoterol  15 mcg Nebulization BID  . aspirin EC  81 mg Oral Daily  . budesonide  0.25 mg Nebulization BID  . carvedilol  25 mg Oral BID WC  . cloNIDine  0.1 mg Oral TID  . clotrimazole  10 mg Oral Daily  . digoxin  0.125 mg Oral q morning - 10a  . furosemide  40-80 mg Oral Daily  . levalbuterol  1.25 mg Nebulization Q6H  . losartan  100 mg Oral Daily  . pantoprazole  40 mg Oral Daily  . potassium chloride  10 mEq Oral QODAY  . prasugrel  10 mg Oral Daily  . predniSONE  5 mg Oral Q breakfast  . ramipril  2.5 mg Oral Daily  . rosuvastatin  10 mg Oral Daily     Infusions: . amiodarone (NEXTERONE PREMIX) 360 mg/200 mL dextrose 30 mg/hr (04/19/13 2304)     PRN Medications:  acetaminophen, albuterol, nitroGLYCERIN, ondansetron (ZOFRAN) IV   Assessment:   1. Chest pain symptoms, possible unstable angina, although cardiac markers are normal at this time. Also complains of palpitations so question association with arrhythmia. His severe lung disease may also be contributing.  2. History of CAD status post DES [facility] one [facility]. On ASA and prasugrel.  3.  Cardiomyopathy, LVEF 50-55% by most recent echocardiogram in December. Rate 1 diastolic dysfunction with increased filling pressures.  4. Severe, oxygen-dependent COPD , by Dr. Juanetta Gosling and Dr. Delton Coombes. Patient reportedly being considered for transplant evaluation at [facility], has not been seen as yet.  5. Severely dilated RV with severely reduced function and known RV thrombus, on Coumadin.  6. Recent documented PAF and atrial tachycardia by monitoring, history of palpitations. Recently with some symptomatic MAT and frequent atrial ectopy. Options for antiarrhythmic therapy are limited. He was placed on amiodarone on evaluation yesterday with Dr. Royann Shivers.   Plan/Discussion:    Complex patient. General plan at this time is to hold Coumadin in anticipation of a diagnostic heart  catheterization to clarify coronary anatomy and stent patency (he should be bridged when subtherapeutic). Although a right heart catheterization would probably be useful to determine pulmonary pressures, would be somewhat reluctant to do this with known RV thrombus. We will ask Pulmonary to follow this patient during his hospitalization to help optimize his pulmonary regimen - he follows with Dr. Delton Coombes. Would also like EP to see this patient in consultation regarding management of his atrial arrhythmias. He has been on a good dose of Coreg and also digoxin, I have been reluctant to put him on antiarrhythmic therapy in light of his comorbidities. He has been placed on amiodarone temporarily and this has helped to stabilize his rhythm. Obvious concern would be continuing this long-term with his severe lung disease.   Jonelle Sidle, M.D., F.A.C.C.

## 2013-04-20 NOTE — Consult Note (Addendum)
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 ishemia or arrhmia related chf.  SIGNIFICANT EVENTS / STUDIES:  LHC for 1/20 >>>     HISTORY OF PRESENT ILLNESS:   58 y.o. male with a past medical history significant for severe oxygen dependent lung disease, right heart failure, RV thrombus on warfarin, CAD (approximately 1 year s/p drug eluting stent to OM), mild ischemic cardiomyopathy EF 40-45%, severe HTN, history of paroxysmal atrial fibrillation and paroxysmal atrial tachycardia.  He presents with complaints of a squeezing/twisting retrosternal discomfort associated with dyspnea, as well as increasing palpitations. Tachyarrhythmia has been challenging to manage in the past, but chest tightness appears to be a newly associated complaint.  Initial management in the ED was difficult due to low BP and digoxin was administered with little benefit. Some reduction in arrhythmia burden with iv amiodarone.  Prior to onset of acute decomp sob had noted some increase in bilateral chronic edema but not cough  No obvious day to day or daytime variabilty or assoc   chest tightness, subjective wheeze overt sinus or hb symptoms. No unusual exp hx or h/o childhood pna/ asthma or knowledge of premature birth.  Sleeping ok typically on bipap /02  without nocturnal  or early am exacerbation  of respiratory  c/o's or need for noct saba. Also denies any obvious fluctuation of symptoms with weather or environmental changes or other aggravating or alleviating factors except as  outlined above   Current Medications, Allergies, Complete Past Medical History, Past Surgical History, Family History, and Social History were reviewed in [employer] record.  ROS  The following are not active complaints unless bolded sore throat, dysphagia, dental problems, itching, sneezing,  nasal congestion or excess/ purulent secretions, ear ache,   fever, chills, sweats, unintended wt loss, pleuritic or exertional cp, hemoptysis,  orthopnea pnd or leg swelling, presyncope, palpitations, heartburn, abdominal pain, anorexia, nausea, vomiting, diarrhea  or change in bowel or urinary habits, change in stools or urine, dysuria,hematuria,  rash, arthralgias, visual complaints, headache, numbness weakness or ataxia or problems with walking or coordination,  change in mood/affect or memory.        PAST MEDICAL HISTORY :  Past Medical History  Diagnosis Date  . GERD (gastroesophageal reflux disease)   . COPD (chronic obstructive pulmonary disease)   . Myocardial infarction 05/02/12  . Chronic systolic heart failure   . Essential hypertension, benign   . Cardiomyopathy     LVEF 40-45%  . Right ventricular mural thrombus     On Coumadin  . Cor pulmonale     Severe RV dysfunction  . Coronary atherosclerosis of native coronary artery     DES to OM [facility]  . Atrial fibrillation   . Spontaneous pneumothorax 2002    Bilateral   Past Surgical History  Procedure Laterality Date  . 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 Coronary angioplasty with stent placement  04/2012    in [place], [place]  Prior to Admission medications   Medication Sig Start Date End Date Taking? Authorizing Provider  acetaminophen (TYLENOL) 500 MG tablet Take 1,000 mg by mouth every 6 (six) hours as needed for moderate pain.    Yes Historical Provider, MD  albuterol (PROVENTIL HFA;VENTOLIN HFA) 108 (90 BASE) MCG/ACT inhaler Inhale 2 puffs into the lungs every 6  (six) hours as needed for wheezing or shortness of breath.    Yes Historical Provider, MD  antiseptic oral rinse (BIOTENE) LIQD 15 mLs by Mouth Rinse route 4 (four) times daily as needed for dry mouth.   Yes Historical Provider, MD  arformoterol (BROVANA) 15 MCG/2ML NEBU Take 15 mcg by nebulization 2 (two) times daily.   Yes Historical Provider, MD  budesonide (PULMICORT) 0.25 MG/2ML nebulizer solution Take 0.25 mg by nebulization 2 (two) times daily.   Yes Historical Provider, MD  carvedilol (COREG) 25 MG tablet Take 25 mg by mouth 2 (two) times daily with a meal.   Yes Historical Provider, MD  cloNIDine (CATAPRES) 0.1 MG tablet Take 0.1 mg by mouth 3 (three) times daily.   Yes Historical Provider, MD  clotrimazole (MYCELEX) 10 MG troche Take 10 mg by mouth daily.  02/25/13  Yes Historical Provider, MD  digoxin (LANOXIN) 0.125 MG tablet Take 0.125 mg by mouth every morning.   Yes Historical Provider, MD  furosemide (LASIX) 40 MG tablet Take 40-80 mg by mouth daily. *takes an extra 40mg  tablet if needed for swelling   Yes Historical Provider, MD  levalbuterol (XOPENEX) 1.25 MG/0.5ML nebulizer solution Take 1.25 mg by nebulization every 6 (six) hours. 04/01/13  Yes Dorothea Ogle, MD  losartan (COZAAR) 100 MG tablet Take 100 mg by mouth every morning.  02/03/13  Yes Historical Provider, MD  mupirocin nasal ointment (BACTROBAN) 2 % Apply in each nostril bid 04/14/13  Yes Olivia Mackie, MD  omeprazole (PRILOSEC) 20 MG capsule Take 20 mg by mouth 2 (two) times daily.    Yes Historical Provider, MD  potassium chloride (K-DUR) 10 MEQ tablet Take 10 mEq by mouth every other day.   Yes Historical Provider, MD  prasugrel (EFFIENT) 10 MG TABS Take 10 mg by mouth daily.   Yes Historical Provider, MD  predniSONE (DELTASONE) 5 MG tablet Take 1 tablet (5 mg total) by mouth daily with breakfast. 04/17/13  Yes Leslye Peer, MD  ramipril (ALTACE) 2.5 MG capsule Take 2.5 mg by mouth daily.   Yes Historical Provider, MD    rosuvastatin (CRESTOR) 10 MG tablet Take 10 mg by mouth every evening.   Yes Historical Provider, MD  sodium chloride (OCEAN) 0.65 % nasal spray Place 1 spray into the nose every 6 (six) hours as needed for congestion.   Yes Historical Provider, MD  warfarin (COUMADIN) 2.5 MG tablet Take 2.5-3.75 mg by mouth daily. Take 2.5 mg tablet alternating with 3.75 mg  tablets every other day   Yes Historical Provider, MD   Allergies  Allergen Reactions  . Lipitor [Atorvastatin] Other (See Comments)    Muscle spasms     FAMILY HISTORY:  No family history on file. SOCIAL HISTORY:  reports that he quit smoking about 19 years ago. His smoking use included Cigarettes. He has a 50 pack-year smoking history. He has never used smokeless tobacco. He reports that he does not drink alcohol or use illicit drugs.     SUBJECTIVE:  Feeling much better since admit   VITAL SIGNS: Temp:  [98.3 F (36.8 C)-98.9 F (37.2 C)] 98.3  F (36.8 C) (01/18 0418) Pulse Rate:  [76-127] 77 (01/18 0418) Resp:  [17-31] 22 (01/18 0418) BP: (94-133)/(54-87) 128/87 mmHg (01/18 0418) SpO2:  [89 %-98 %] 90 % (01/18 0842) Weight:  [166 lb 12.8 oz (75.66 kg)] 166 lb 12.8 oz (75.66 kg) (01/17 2214)  02 rx = 4lpm    HEMODYNAMICS:   VENTILATOR SETTINGS:   INTAKE / OUTPUT: Intake/Output     01/17 0701 - 01/18 0700 01/18 0701 - 01/19 0700   P.O. 600    I.V. (mL/kg) 274.3 (3.6)    Total Intake(mL/kg) 874.3 (11.6)    Urine (mL/kg/hr) 750    Total Output 750     Net +124.3          Stool Occurrence 1 x      PHYSICAL EXAMINATION: General:  Pleasant bm nad HEENT mild turbinate edema.  Oropharynx no thrush or excess pnd or cobblestoning.  No JVD or cervical adenopathy. Mild accessory muscle hypertrophy. Trachea midline, nl thryroid. Chest was hyperinflated by percussion with diminished breath sounds and moderate increased exp time without wheeze. Hoover sign positive at mid inspiration. Regular rate and rhythm without  murmur gallop or rub or increase P2 - he has 1-2 plus pitting both ankles.  Abd: no hsm, nl excursion. Ext warm without cyanosis or clubbing.   LABS:  CBC  Recent Labs Lab 04/14/13 0057 04/19/13 1345  WBC 13.4* 9.2  HGB 13.4 11.7*  HCT 40.6 36.7*  PLT 189 175   Coag's  Recent Labs Lab 04/14/13 0057 04/19/13 1535 04/20/13 0501  INR 2.24* 3.29* 2.32*   BMET  Recent Labs Lab 04/19/13 1345 04/20/13 0025  NA 143 142  K 3.6* 3.4*  CL 101 100  CO2 32 28  BUN 27* 28*  CREATININE 1.36* 1.34  GLUCOSE 117* 210*   Electrolytes  Recent Labs Lab 04/19/13 1345 04/20/13 0025  CALCIUM 8.8 8.9   Sepsis Markers No results found for this basename: LATICACIDVEN, PROCALCITON, O2SATVEN,  in the last 168 hours ABG No results found for this basename: PHART, PCO2ART, PO2ART,  in the last 168 hours Liver Enzymes No results found for this basename: AST, ALT, ALKPHOS, BILITOT, ALBUMIN,  in the last 168 hours Cardiac Enzymes  Recent Labs Lab 04/19/13 1332 04/20/13 0025 04/20/13 0501 04/20/13 1135  TROPONINI  --  <0.30 <0.30 <0.30  PROBNP 2727.0*  --   --   --    Glucose No results found for this basename: GLUCAP,  in the last 168 hours  Imaging Dg Chest 2 View  04/19/2013   CLINICAL DATA:  Dyspnea and weakness with history of COPD, on waiting list for lung transplant.  EXAM: CHEST  2 VIEW  COMPARISON:  Portable chest x-ray March 29, 2013.  FINDINGS: The lungs are borderline hypoinflated. This appears stable. The left hemidiaphragm is higher than the right which is also stable. There are coarse increased lung markings bilaterally with areas of near confluence. These are stable. The cardiac silhouette is top-normal in size. The pulmonary vascularity is not engorged. There is no pleural effusion or pneumothorax. The mediastinum is normal in width. The observed portions of the bony thorax appear normal.  IMPRESSION: There has not been significant interval change since the study of  29 March 2013. Bilateral pleural and parenchymal scarring is suspected. There is no pneumothorax. An element of low grade CHF may be present.   Electronically Signed   By: David  [place]   On: 04/19/2013 14:13  ASSESSMENT / PLAN:  1) GOLD IV COPD/ chronic resp failure/ 02 and steroid dep - PFT's 03/2013 0.85 (29%) ratio 43 and no change p B2   - HC03 = 32 04/19/13 so likely chronically hypercarbic as well - PAH with R mural thrombus on chronic anticogulation   DDX of  difficult airways managment all start with A and  include Adherence, Ace Inhibitors, Acid Reflux, Active Sinus Disease, Alpha 1 Antitripsin deficiency, Anxiety masquerading as Airways dz,  ABPA,  allergy(esp in young), Aspiration (esp in elderly), Adverse effects of DPI,  Active smokers, plus two Bs  = Bronchiectasis and Beta blocker use..and one C= CHF   Adherence is always the initial "prime suspect" and is a multilayered concern that requires a "trust but verify" approach in every patient - starting with knowing how to use medications, especially inhalers, correctly, keeping up with refills and understanding the fundamental difference between maintenance and prns vs those medications only taken for a very short course and then stopped and not refilled.  - not clear why he's on brovan and xopenex qid as outpt as these are redundant and arrythmogenic in this dose.  ACEi potentially problematic here but probably ok continue in absence of cough (which is not the only overlapping symptoms between acei effect and COPD but simply the most reported effect on the upper airway  Abrupt onset of symptoms s assoc uri/ cough/ congestion all strongly point to chf as cause of decomp leading to admit.  He uses too many forms of saba and would be  Better served on  1) a more specific BB like bisoprolol 2) brovana and prn xopenex hfa/ with neb xopenex reserved for aecopd   Sandrea HughsMichael Kora Groom, MD Pulmonary and Critical Care  Medicine [facility] Cell [phone] After 5:30 PM or weekends, call [phone]

## 2013-04-20 NOTE — Progress Notes (Signed)
Provider on call notified of Potassium level 3.4.  Awaiting response

## 2013-04-20 NOTE — Progress Notes (Signed)
Patient from [facility], transported by EMS via stretcher.  No chest pains no shortness of breath noted.  Patient on amiodarone drip and O2 support at 3lpm via nasal cannula.  Oriented pt to unit routines and policies.  Instructed to call for concerns and assistance.  Will monitor and evaluate at intervals.

## 2013-04-21 ENCOUNTER — Encounter (HOSPITAL_COMMUNITY): Payer: Managed Care, Other (non HMO)

## 2013-04-21 LAB — CBC
HCT: 37.7 % — ABNORMAL LOW (ref 39.0–52.0)
Hemoglobin: 12.5 g/dL — ABNORMAL LOW (ref 13.0–17.0)
MCH: 32.1 pg (ref 26.0–34.0)
MCHC: 33.2 g/dL (ref 30.0–36.0)
MCV: 96.9 fL (ref 78.0–100.0)
PLATELETS: 225 10*3/uL (ref 150–400)
RBC: 3.89 MIL/uL — ABNORMAL LOW (ref 4.22–5.81)
RDW: 14.7 % (ref 11.5–15.5)
WBC: 8.8 10*3/uL (ref 4.0–10.5)

## 2013-04-21 LAB — PROTIME-INR
INR: 2.58 — ABNORMAL HIGH (ref 0.00–1.49)
Prothrombin Time: 26.8 seconds — ABNORMAL HIGH (ref 11.6–15.2)

## 2013-04-21 LAB — BASIC METABOLIC PANEL
BUN: 26 mg/dL — AB (ref 6–23)
CALCIUM: 9.2 mg/dL (ref 8.4–10.5)
CO2: 31 mEq/L (ref 19–32)
Chloride: 101 mEq/L (ref 96–112)
Creatinine, Ser: 1.22 mg/dL (ref 0.50–1.35)
GFR calc non Af Amer: 65 mL/min — ABNORMAL LOW (ref >90)
GFR, EST AFRICAN AMERICAN: 75 mL/min — AB (ref >90)
Glucose, Bld: 159 mg/dL — ABNORMAL HIGH (ref 70–99)
Potassium: 4.2 mEq/L (ref 3.7–5.3)
Sodium: 143 mEq/L (ref 137–147)

## 2013-04-21 MED ORDER — SODIUM CHLORIDE 0.9 % IV SOLN
250.0000 mL | INTRAVENOUS | Status: DC | PRN
Start: 2013-04-21 — End: 2013-04-23

## 2013-04-21 MED ORDER — BISOPROLOL FUMARATE 10 MG PO TABS
20.0000 mg | ORAL_TABLET | Freq: Every day | ORAL | Status: DC
  Filled 2013-04-21 (×2): qty 2

## 2013-04-21 MED ORDER — SODIUM CHLORIDE 0.9 % IV SOLN: INTRAVENOUS | Status: DC

## 2013-04-21 MED ORDER — ASPIRIN 81 MG PO CHEW: 81.0000 mg | CHEWABLE_TABLET | ORAL | Status: DC

## 2013-04-21 MED ORDER — SODIUM CHLORIDE 0.9 % IJ SOLN: 3.0000 mL | INTRAMUSCULAR | Status: DC | PRN

## 2013-04-21 MED ORDER — SODIUM CHLORIDE 0.9 % IJ SOLN
3.0000 mL | Freq: Two times a day (BID) | INTRAMUSCULAR | Status: DC
  Administered 2013-04-21 – 2013-04-22 (×2): 3 mL via INTRAVENOUS

## 2013-04-21 NOTE — Progress Notes (Signed)
Patient stated he would place home CPAP on when he was ready.  Was told if he needed any help to call RT.

## 2013-04-21 NOTE — Progress Notes (Signed)
Subjective: No further chest tightness. Still with intermittent palpitations. He continues to feel SOB ambulating from the bed to the chair. He has a continuous pulse ox monitor at his bedside and he notes that his O2 sats drop in the 80s when he ambulates from bed to chair. O2 sats have been in the low 90s at rest. He states that is is requiring more O2 here than at home. He uses 3L of home O2 while resting. He is on 4 L currently.   Objective: Vital signs in last 24 hours: Temp:  [97.5 F (36.4 C)-98.5 F (36.9 C)] 98.5 F (36.9 C) (01/19 0455) Pulse Rate:  [58-77] 62 (01/19 0700) Resp:  [20-21] 20 (01/19 0455) BP: (108-115)/(67-78) 115/67 mmHg (01/19 0700) SpO2:  [90 %-99 %] 97 % (01/19 0455) Last BM Date: 04/20/13  Intake/Output from previous day: 01/18 0701 - 01/19 0700 In: 600 [P.O.:600] Out: 601 [Urine:600; Stool:1] Intake/Output this shift: Total I/O In: -  Out: 200 [Urine:200]  Medications Current Facility-Administered Medications  Medication Dose Route Frequency Provider Last Rate Last Dose  . acetaminophen (TYLENOL) tablet 650 mg  650 mg Oral Q4H PRN Thurmon Fair, MD   650 mg at 04/20/13 2111  . ALPRAZolam Prudy Feeler) tablet 0.5 mg  0.5 mg Oral Once Haydee Salter, MD      . amiodarone (PACERONE) tablet 200 mg  200 mg Oral Q12H Mihai Croitoru, MD   200 mg at 04/20/13 2111   Followed by  . [START ON 04/27/2013] amiodarone (PACERONE) tablet 200 mg  200 mg Oral Daily Mihai Croitoru, MD      . arformoterol (BROVANA) nebulizer solution 15 mcg  15 mcg Nebulization BID Thurmon Fair, MD   15 mcg at 04/20/13 2040  . aspirin EC tablet 81 mg  81 mg Oral Daily Mihai Croitoru, MD   81 mg at 04/20/13 1115  . budesonide (PULMICORT) nebulizer solution 0.25 mg  0.25 mg Nebulization BID Mihai Croitoru, MD   0.25 mg at 04/20/13 2040  . carvedilol (COREG) tablet 25 mg  25 mg Oral BID WC Mihai Croitoru, MD   25 mg at 04/21/13 0725  . cloNIDine (CATAPRES) tablet 0.1 mg  0.1 mg Oral TID Thurmon Fair, MD   0.1 mg at 04/20/13 2111  . clotrimazole (MYCELEX) lozenge 10 mg  10 mg Oral Daily Mihai Croitoru, MD   10 mg at 04/20/13 1114  . digoxin (LANOXIN) tablet 0.125 mg  0.125 mg Oral q morning - 10a Mihai Croitoru, MD   0.125 mg at 04/20/13 1115  . furosemide (LASIX) tablet 40 mg  40 mg Oral Daily Mihai Croitoru, MD      . levalbuterol (XOPENEX) nebulizer solution 0.63 mg  0.63 mg Nebulization Q6H PRN Mihai Croitoru, MD   0.63 mg at 04/20/13 1352  . losartan (COZAAR) tablet 100 mg  100 mg Oral Daily Mihai Croitoru, MD   100 mg at 04/20/13 1114  . nitroGLYCERIN (NITROSTAT) SL tablet 0.4 mg  0.4 mg Sublingual Q5 Min x 3 PRN Mihai Croitoru, MD      . ondansetron (ZOFRAN) injection 4 mg  4 mg Intravenous Q6H PRN Mihai Croitoru, MD      . pantoprazole (PROTONIX) EC tablet 40 mg  40 mg Oral Daily Mihai Croitoru, MD   40 mg at 04/20/13 1535  . potassium chloride (K-DUR) CR tablet 10 mEq  10 mEq Oral QODAY Mihai Croitoru, MD   10 mEq at 04/20/13 1114  . prasugrel (EFFIENT) tablet 10 mg  10 mg Oral Daily Mihai Croitoru, MD   10 mg at 04/20/13 1114  . predniSONE (DELTASONE) tablet 5 mg  5 mg Oral Q breakfast Mihai Croitoru, MD   5 mg at 04/21/13 0725  . ramipril (ALTACE) capsule 2.5 mg  2.5 mg Oral Daily Mihai Croitoru, MD   2.5 mg at 04/20/13 1114  . rosuvastatin (CRESTOR) tablet 10 mg  10 mg Oral Daily Mihai Croitoru, MD   10 mg at 04/20/13 1114    PE: General appearance: alert, cooperative and no distress Lungs: decreased breath sounds bilaterally R>L Heart: regular rate and rhythm Extremities: 1+ LEE R>L Pulses: 2+ and symmetric Skin: warm and dry Neurologic: Grossly normal  Lab Results:   Recent Labs  04/19/13 1345 04/21/13 0325  WBC 9.2 8.8  HGB 11.7* 12.5*  HCT 36.7* 37.7*  PLT 175 225   BMET  Recent Labs  04/19/13 1345 04/20/13 0025 04/21/13 0325  NA 143 142 143  K 3.6* 3.4* 4.2  CL 101 100 101  CO2 32 28 31  GLUCOSE 117* 210* 159*  BUN 27* 28* 26*  CREATININE  1.36* 1.34 1.22  CALCIUM 8.8 8.9 9.2   PT/INR  Recent Labs  04/19/13 1535 04/20/13 0501 04/21/13 0325  LABPROT 32.3* 24.7* 26.8*  INR 3.29* 2.32* 2.58*   Cholesterol  Recent Labs  04/20/13 0501  CHOL 115   Cardiac Panel (last 3 results)  Recent Labs  04/20/13 0025 04/20/13 0501 04/20/13 1135  TROPONINI <0.30 <0.30 <0.30     Assessment/Plan  Active Problems:   Multifocal atrial tachycardia   Unstable angina pectoris   PAT (paroxysmal atrial tachycardia)  Plan: NSR on telemetry with frequent PVCs. HR in the 80s. Now on low dose PO amiodarone. He has chronic lung disease. Will need to monitor for any increased SOB. Pulmonary also following. No further chest tightness. Troponins negative x 3. Plan of LHC once INR is below 1.7. Warfarin is on hold. INR is 2.58 today. BP is stable. Continue DAPT w/ ASA + Effient, BB, ARB and Lasix.  Will continue to monitor.     LOS: 2 days    Brittainy M. Delmer IslamSimmons, PA-C 04/21/2013 8:18 AM  Patient seen and examined and history reviewed. Agree with above findings and plan. Patient reports he is much better than 36 hours ago. Still gets SOB with activity. Telemetry demonstrates improvement in atrial tachycardia but still having some PACs and PVCs. On amiodarone at 200 mg daily. Will switch coreg to bisoprolol. Plan cardiac cath once INR below 1.7. ? Tomorrow or Wednesday. Would include a right heart cath with evidence of pulmonary HTN on Echo. If stent is patent will need to decide appropriate antiplatelet therapy since he is one year out from stent placement and also requires long term anticoagulation with coumadin.   Theron Aristaeter [facility] Wall[facility] General HospitalJordanMD,FACC  04/21/2013 9:28 AM

## 2013-04-21 NOTE — Progress Notes (Signed)
Pt. Refused cpap last night per charge RT.

## 2013-04-21 NOTE — Progress Notes (Signed)
Respiratory therapy note-Called to assess home nasal Bipap machine. Hospital equipment was unavailable, so patient's family brought his from home. Currently he off the machine and has been placed back to 4.5l nasal canula, Sp02 90-91%.

## 2013-04-22 DIAGNOSIS — Z7901 Long term (current) use of anticoagulants: Secondary | ICD-10-CM

## 2013-04-22 LAB — CBC
HEMATOCRIT: 36.9 % — AB (ref 39.0–52.0)
HEMOGLOBIN: 11.7 g/dL — AB (ref 13.0–17.0)
MCH: 31 pg (ref 26.0–34.0)
MCHC: 31.7 g/dL (ref 30.0–36.0)
MCV: 97.6 fL (ref 78.0–100.0)
Platelets: 229 10*3/uL (ref 150–400)
RBC: 3.78 MIL/uL — AB (ref 4.22–5.81)
RDW: 14.7 % (ref 11.5–15.5)
WBC: 8.8 10*3/uL (ref 4.0–10.5)

## 2013-04-22 LAB — BASIC METABOLIC PANEL
BUN: 23 mg/dL (ref 6–23)
CHLORIDE: 102 meq/L (ref 96–112)
CO2: 32 meq/L (ref 19–32)
Calcium: 8.7 mg/dL (ref 8.4–10.5)
Creatinine, Ser: 1.27 mg/dL (ref 0.50–1.35)
GFR calc Af Amer: 71 mL/min — ABNORMAL LOW (ref >90)
GFR calc non Af Amer: 62 mL/min — ABNORMAL LOW (ref >90)
GLUCOSE: 108 mg/dL — AB (ref 70–99)
Potassium: 3.4 mEq/L — ABNORMAL LOW (ref 3.7–5.3)
SODIUM: 144 meq/L (ref 137–147)

## 2013-04-22 LAB — PROTIME-INR
INR: 2 — ABNORMAL HIGH (ref 0.00–1.49)
INR: 1.44 (ref 0.00–1.49)
PROTHROMBIN TIME: 17.2 s — AB (ref 11.6–15.2)
Prothrombin Time: 22.1 seconds — ABNORMAL HIGH (ref 11.6–15.2)

## 2013-04-22 LAB — HEPARIN LEVEL (UNFRACTIONATED): Heparin Unfractionated: 0.2 IU/mL — ABNORMAL LOW (ref 0.30–0.70)

## 2013-04-22 MED ORDER — SODIUM CHLORIDE 0.9 % IV SOLN: 250.0000 mL | INTRAVENOUS | Status: DC | PRN

## 2013-04-22 MED ORDER — SODIUM CHLORIDE 0.9 % IJ SOLN: 3.0000 mL | INTRAMUSCULAR | Status: DC | PRN

## 2013-04-22 MED ORDER — METOPROLOL TARTRATE 50 MG PO TABS
75.0000 mg | ORAL_TABLET | Freq: Two times a day (BID) | ORAL | Status: DC
  Administered 2013-04-22 – 2013-04-24 (×5): 75 mg via ORAL
  Filled 2013-04-22 (×9): qty 1

## 2013-04-22 MED ORDER — FUROSEMIDE 40 MG PO TABS
40.0000 mg | ORAL_TABLET | Freq: Two times a day (BID) | ORAL | Status: DC
  Administered 2013-04-22 – 2013-04-23 (×2): 40 mg via ORAL
  Filled 2013-04-22 (×5): qty 1

## 2013-04-22 MED ORDER — LEVALBUTEROL HCL 1.25 MG/0.5ML IN NEBU
1.2500 mg | INHALATION_SOLUTION | Freq: Four times a day (QID) | RESPIRATORY_TRACT | Status: DC
  Administered 2013-04-22 – 2013-04-24 (×7): 1.25 mg via RESPIRATORY_TRACT
  Filled 2013-04-22 (×14): qty 0.5

## 2013-04-22 MED ORDER — HEPARIN (PORCINE) IN NACL 100-0.45 UNIT/ML-% IJ SOLN
1150.0000 [IU]/h | INTRAMUSCULAR | Status: DC
  Administered 2013-04-22 – 2013-04-23 (×2): 1150 [IU]/h via INTRAVENOUS
  Filled 2013-04-22 (×4): qty 250

## 2013-04-22 MED ORDER — LEVALBUTEROL HCL 1.25 MG/0.5ML IN NEBU
1.2500 mg | INHALATION_SOLUTION | RESPIRATORY_TRACT | Status: DC | PRN
  Filled 2013-04-22: qty 0.5

## 2013-04-22 MED ORDER — POTASSIUM CHLORIDE CRYS ER 20 MEQ PO TBCR
40.0000 meq | EXTENDED_RELEASE_TABLET | Freq: Once | ORAL | Status: AC
  Administered 2013-04-22: 40 meq via ORAL
  Filled 2013-04-22: qty 2

## 2013-04-22 MED ORDER — SODIUM CHLORIDE 0.9 % IV SOLN
INTRAVENOUS | Status: DC
  Administered 2013-04-23: 10 mL/h via INTRAVENOUS

## 2013-04-22 MED ORDER — SODIUM CHLORIDE 0.9 % IJ SOLN: 3.0000 mL | Freq: Two times a day (BID) | INTRAMUSCULAR | Status: DC

## 2013-04-22 NOTE — Telephone Encounter (Signed)
Pt was admitted to [place], planning on doing a cath.

## 2013-04-22 NOTE — Progress Notes (Signed)
ANTICOAGULATION CONSULT NOTE - Initial Consult  Pharmacy Consult for Heparin Indication: CP - on Coumadin PTA -- bridge for cath  Allergies  Allergen Reactions  . Lipitor [Atorvastatin] Other (See Comments)    Muscle spasms    Labs:  Recent Labs  04/19/13 1345  04/20/13 0025 04/20/13 0501 04/20/13 1135 04/21/13 0325 04/22/13 0432  HGB 11.7*  --   --   --   --  12.5* 11.7*  HCT 36.7*  --   --   --   --  37.7* 36.9*  PLT 175  --   --   --   --  225 229  LABPROT  --   < >  --  24.7*  --  26.8* 22.1*  INR  --   < >  --  2.32*  --  2.58* 2.00*  CREATININE 1.36*  --  1.34  --   --  1.22 1.27  TROPONINI  --   --  <0.30 <0.30 <0.30  --   --   < > = values in this interval not displayed.  Estimated Creatinine Clearance: 60.7 ml/min (by C-G formula based on Cr of 1.27).   Medical History: Past Medical History  Diagnosis Date  . GERD (gastroesophageal reflux disease)   . COPD (chronic obstructive pulmonary disease)   . Myocardial infarction 05/02/12  . Chronic systolic heart failure   . Essential hypertension, benign   . Cardiomyopathy     LVEF 40-45%  . Right ventricular mural thrombus     On Coumadin  . Cor pulmonale     Severe RV dysfunction  . Coronary atherosclerosis of native coronary artery     DES to OM [facility]  . Atrial fibrillation   . Spontaneous pneumothorax 2002    Bilateral    Assessment: 58 year old male on Coumadin PTA for Afib.  Coumadin on hold in preparation for cath.  Pharmacy asked to begin IV heparin.  Goal of Therapy:  Heparin level 0.3-0.7 units/ml Monitor platelets by anticoagulation protocol: Yes   Plan:  1) No heparin bolus 2) Heparin drip at 1150 units / hr 3) Heparin level 8 hours after heparin begins 4) Daily heparin level, CBC  Thank you. Okey Regal, PharmD [phone]  04/22/2013,10:16 AM

## 2013-04-22 NOTE — Progress Notes (Signed)
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 ishemia or arrhmia related chf.  SIGNIFICANT EVENTS / STUDIES:  LHC for 1/20 >>>     HISTORY OF PRESENT ILLNESS:   58 y.o. male with a past medical history significant for severe oxygen dependent lung disease, right heart failure, RV thrombus on warfarin, CAD (approximately 1 year s/p drug eluting stent to OM), mild ischemic cardiomyopathy EF 40-45%, severe HTN, history of paroxysmal atrial fibrillation and paroxysmal atrial tachycardia.  He presents with complaints of a squeezing/twisting retrosternal discomfort associated with dyspnea, as well as increasing palpitations. Tachyarrhythmia has been challenging to manage in the past, but chest tightness appears to be a newly associated complaint.  Initial management in the ED was difficult due to low BP and digoxin was administered with little benefit. Some reduction in arrhythmia burden with iv amiodarone.  Prior to onset of acute decomp sob had noted some increase in bilateral chronic edema but not cough   SUBJECTIVE:  Feeling much better since admit. On 3 l [place] with O2 sats 90%  VITAL SIGNS: Temp:  [98.1 F (36.7 C)-98.4 F (36.9 C)] 98.3 F (36.8 C) (01/20 0300) Pulse Rate:  [57-75] 75 (01/20 0300) Resp:  [18] 18 (01/20 0300) BP: (98-141)/(67-97) 141/97 mmHg (01/20 0300) SpO2:  [95 %-98 %] 95 % (01/20 0837)  02 rx = 3lpm ->90%   HEMODYNAMICS:   VENTILATOR SETTINGS:   INTAKE / OUTPUT: Intake/Output     01/19 0701 - 01/20 0700 01/20 0701 -  01/21 0700   P.O. 720    Total Intake(mL/kg) 720 (9.5)    Urine (mL/kg/hr) 1350 (0.7)    Stool     Total Output 1350     Net -630          Urine Occurrence 1 x      PHYSICAL EXAMINATION: General:  Pleasant bm nad, sitting on commode reading newspaper HEENT   Oropharynx no thrush or excess pnd or cobblestoning.  No JVD or cervical adenopathy.  Mild accessory muscle hypertrophy. Trachea midline, nl thryroid.  diminished breath sounds and moderate increased exp time without wheeze. Hoover sign positive at mid inspiration.  Regular rate and rhythm without murmur gallop or rub or increase P2 - he has 1-2 plus pitting both ankles.  Off IV amio Abd: no hsm, nl excursion.  Ext warm without cyanosis or clubbing.   LABS:  CBC  Recent Labs Lab 04/19/13 1345 04/21/13 0325 04/22/13 0432  WBC 9.2 8.8 8.8  HGB 11.7* 12.5* 11.7*  HCT 36.7* 37.7* 36.9*  PLT 175 225 229   Coag's  Recent Labs Lab 04/20/13 0501 04/21/13 0325 04/22/13 0432  INR 2.32* 2.58* 2.00*   BMET  Recent Labs Lab 04/20/13 0025 04/21/13 0325 04/22/13 0432  NA 142 143 144  K 3.4* 4.2 3.4*  CL 100 101 102  CO2 28 31 32  BUN 28* 26* 23  CREATININE 1.34 1.22 1.27  GLUCOSE 210* 159* 108*   Electrolytes  Recent Labs Lab 04/20/13 0025  04/21/13 0325 04/22/13 0432  CALCIUM 8.9 9.2 8.7   Sepsis Markers No results found for this basename: LATICACIDVEN, PROCALCITON, O2SATVEN,  in the last 168 hours ABG No results found for this basename: PHART, PCO2ART, PO2ART,  in the last 168 hours Liver Enzymes No results found for this basename: AST, ALT, ALKPHOS, BILITOT, ALBUMIN,  in the last 168 hours Cardiac Enzymes  Recent Labs Lab 04/19/13 1332 04/20/13 0025 04/20/13 0501 04/20/13 1135  TROPONINI  --  <0.30 <0.30 <0.30  PROBNP 2727.0*  --   --   --    Glucose No results found for this basename: GLUCAP,  in the last 168 hours  Imaging No results found.      ASSESSMENT / PLAN:  1) GOLD IV  COPD/ chronic resp failure/ 02 and steroid dep Cor pulmonale  - PFT's 03/2013 0.85 (29%) ratio 43 and no change p B2   - HC03 = 32 04/19/13 so likely chronically hypercarbic as well - PAH with R mural thrombus on chronic anticogulation -For heart cath 1-20 if INR allows Consider 1) a more specific BB like bisoprolol 2) brovana and prn xopenex hfa/ with neb xopenex reserved for aecopd 3) diuresis with lasix   ALVA,RAKESH V.

## 2013-04-22 NOTE — Progress Notes (Addendum)
Subjective: No further chest tightness. Still with intermittent palpitations. He continues to feel SOB ambulating from the bed to the chair. Desats easily with ambulation.  Objective: Vital signs in last 24 hours: Temp:  [98.1 F (36.7 C)-98.4 F (36.9 C)] 98.3 F (36.8 C) (01/20 0300) Pulse Rate:  [57-75] 75 (01/20 0300) Resp:  [18] 18 (01/20 0300) BP: (98-141)/(67-97) 141/97 mmHg (01/20 0300) SpO2:  [95 %-98 %] 95 % (01/20 0837) Last BM Date: 04/21/13  Intake/Output from previous day: 01/19 0701 - 01/20 0700 In: 720 [P.O.:720] Out: 1350 [Urine:1350] Intake/Output this shift:    Medications Current Facility-Administered Medications  Medication Dose Route Frequency Provider Last Rate Last Dose  . 0.9 %  sodium chloride infusion  250 mL Intravenous PRN Brittainy Simmons, PA-C      . 0.9 %  sodium chloride infusion   Intravenous Continuous Brittainy Simmons, PA-C      . [START ON 04/23/2013] 0.9 %  sodium chloride infusion   Intravenous Continuous Peter M Swazil[place]Jordan, MD      . acetaminophen (TYLENOL) tablet 650 mg  650 mg Oral Q4H PRN Thurmon FairMihai Croitoru, MD   650 mg at 04/21/13 1831  . ALPRAZolam Prudy Feeler(XANAX) tablet 0.5 mg  0.5 mg Oral Once Haydee SalterYan Liu, MD      . amiodarone (PACERONE) tablet 200 mg  200 mg Oral Q12H Mihai Croitoru, MD   200 mg at 04/21/13 2127   Followed by  . [START ON 04/27/2013] amiodarone (PACERONE) tablet 200 mg  200 mg Oral Daily Mihai Croitoru, MD      . arformoterol (BROVANA) nebulizer solution 15 mcg  15 mcg Nebulization BID Thurmon FairMihai Croitoru, MD   15 mcg at 04/22/13 0837  . aspirin chewable tablet 81 mg  81 mg Oral Pre-Cath Brittainy Simmons, PA-C      . aspirin EC tablet 81 mg  81 mg Oral Daily Mihai Croitoru, MD   81 mg at 04/21/13 1020  . budesonide (PULMICORT) nebulizer solution 0.25 mg  0.25 mg Nebulization BID Thurmon FairMihai Croitoru, MD   0.25 mg at 04/22/13 0837  . cloNIDine (CATAPRES) tablet 0.1 mg  0.1 mg Oral TID Thurmon FairMihai Croitoru, MD   0.1 mg at 04/21/13 1703  .  clotrimazole (MYCELEX) lozenge 10 mg  10 mg Oral Daily Mihai Croitoru, MD   10 mg at 04/21/13 1020  . digoxin (LANOXIN) tablet 0.125 mg  0.125 mg Oral q morning - 10a Mihai Croitoru, MD   0.125 mg at 04/20/13 1115  . furosemide (LASIX) tablet 40 mg  40 mg Oral BID Peter M [place]Jordan, MD      . levalbuterol Pauline Aus(XOPENEX) nebulizer solution 0.63 mg  0.63 mg Nebulization Q6H PRN Thurmon FairMihai Croitoru, MD   0.63 mg at 04/22/13 0837  . losartan (COZAAR) tablet 100 mg  100 mg Oral Daily Mihai Croitoru, MD   100 mg at 04/20/13 1114  . metoprolol tartrate (LOPRESSOR) tablet 75 mg  75 mg Oral BID Peter M [place]Jordan, MD      . nitroGLYCERIN (NITROSTAT) SL tablet 0.4 mg  0.4 mg Sublingual Q5 Min x 3 PRN Mihai Croitoru, MD      . ondansetron (ZOFRAN) injection 4 mg  4 mg Intravenous Q6H PRN Mihai Croitoru, MD      . pantoprazole (PROTONIX) EC tablet 40 mg  40 mg Oral Daily Mihai Croitoru, MD   40 mg at 04/21/13 1027  . potassium chloride SA (K-DUR,KLOR-CON) CR tablet 40 mEq  40 mEq Oral Once Peter M [place]Jordan, MD      .  prasugrel (EFFIENT) tablet 10 mg  10 mg Oral Daily Mihai Croitoru, MD   10 mg at 04/21/13 1021  . predniSONE (DELTASONE) tablet 5 mg  5 mg Oral Q breakfast Mihai Croitoru, MD   5 mg at 04/21/13 0725  . ramipril (ALTACE) capsule 2.5 mg  2.5 mg Oral Daily Mihai Croitoru, MD   2.5 mg at 04/20/13 1114  . rosuvastatin (CRESTOR) tablet 10 mg  10 mg Oral Daily Mihai Croitoru, MD   10 mg at 04/21/13 1020  . sodium chloride 0.9 % injection 3 mL  3 mL Intravenous Q12H Brittainy Simmons, PA-C   3 mL at 04/21/13 1703  . sodium chloride 0.9 % injection 3 mL  3 mL Intravenous PRN Robbie Lis, PA-C        PE: General appearance: alert, cooperative and no distress Lungs: decreased breath sounds bilaterally R>L Heart: irregular rate and rhythm, tachy Extremities: 2+ LEE R>L Pulses: 2+ and symmetric Skin: warm and dry Neurologic: Grossly normal  Lab Results:   Recent Labs  04/19/13 1345 04/21/13 0325  04/22/13 0432  WBC 9.2 8.8 8.8  HGB 11.7* 12.5* 11.7*  HCT 36.7* 37.7* 36.9*  PLT 175 225 229   BMET  Recent Labs  04/20/13 0025 04/21/13 0325 04/22/13 0432  NA 142 143 144  K 3.4* 4.2 3.4*  CL 100 101 102  CO2 28 31 32  GLUCOSE 210* 159* 108*  BUN 28* 26* 23  CREATININE 1.34 1.22 1.27  CALCIUM 8.9 9.2 8.7   PT/INR  Recent Labs  04/20/13 0501 04/21/13 0325 04/22/13 0432  LABPROT 24.7* 26.8* 22.1*  INR 2.32* 2.58* 2.00*   Cholesterol  Recent Labs  04/20/13 0501  CHOL 115   Cardiac Panel (last 3 results)  Recent Labs  04/20/13 0025 04/20/13 0501 04/20/13 1135  TROPONINI <0.30 <0.30 <0.30     Assessment/Plan  Active Problems:   Multifocal atrial tachycardia   Unstable angina pectoris   PAT (paroxysmal atrial tachycardia)  Plan: Now in Afib with RVR/ MAT- rate sustained 130s. This started with switch to bisoprolol from Core[facility]. Given need for selective beta blocker will switch now to metoprolol 75 mg bid for better rate control.  Now on  PO amiodarone. He has chronic lung disease.  Pulmonary also following. No further chest tightness. Troponins negative x 3. Plan  right and LHC once INR is below 1.7. Anticipate this will be tomorrow. Warfarin is on hold. INR is 2.0 today. Start IV heparin per pharmacy. BP is stable. Continue DAPT w/ ASA + Effient, BB, ARB. Will increase lasix to bid today. If no significant obstructive CAD by cath I would DC Effient and resume coumadin and baby ASA. Replete potassium today.    LOS: 3 days      Theron Arista [facility]  04/22/2013 9:55 AM

## 2013-04-23 ENCOUNTER — Encounter (HOSPITAL_COMMUNITY)
Admission: EM | Disposition: A | Discharge status: Home / Self Care | Payer: Self-pay | Source: Home / Self Care | Attending: Cardiovascular Disease

## 2013-04-23 ENCOUNTER — Encounter (HOSPITAL_COMMUNITY): Payer: Managed Care, Other (non HMO)

## 2013-04-23 HISTORY — PX: LEFT AND RIGHT HEART CATHETERIZATION WITH CORONARY ANGIOGRAM: SHX5449

## 2013-04-23 LAB — POCT I-STAT 3, VENOUS BLOOD GAS (G3P V)
Acid-Base Excess: 10 mmol/L — ABNORMAL HIGH (ref 0.0–2.0)
Bicarbonate: 37 mEq/L — ABNORMAL HIGH (ref 20.0–24.0)
O2 Saturation: 54 %
PH VEN: 7.394 — AB (ref 7.250–7.300)
TCO2: 39 mmol/L (ref 0–100)
pCO2, Ven: 60.6 mmHg — ABNORMAL HIGH (ref 45.0–50.0)
pO2, Ven: 30 mmHg (ref 30.0–45.0)

## 2013-04-23 LAB — POCT I-STAT 3, ART BLOOD GAS (G3+)
ACID-BASE EXCESS: 8 mmol/L — AB (ref 0.0–2.0)
Acid-Base Excess: 9 mmol/L — ABNORMAL HIGH (ref 0.0–2.0)
Bicarbonate: 34.6 mEq/L — ABNORMAL HIGH (ref 20.0–24.0)
Bicarbonate: 34.7 mEq/L — ABNORMAL HIGH (ref 20.0–24.0)
O2 SAT: 86 %
O2 Saturation: 92 %
PCO2 ART: 52 mmHg — AB (ref 35.0–45.0)
TCO2: 36 mmol/L (ref 0–100)
TCO2: 36 mmol/L (ref 0–100)
pCO2 arterial: 53.3 mmHg — ABNORMAL HIGH (ref 35.0–45.0)
pH, Arterial: 7.421 (ref 7.350–7.450)
pH, Arterial: 7.432 (ref 7.350–7.450)
pO2, Arterial: 52 mmHg — ABNORMAL LOW (ref 80.0–100.0)
pO2, Arterial: 64 mmHg — ABNORMAL LOW (ref 80.0–100.0)

## 2013-04-23 LAB — CBC
HEMATOCRIT: 35.4 % — AB (ref 39.0–52.0)
HEMOGLOBIN: 11.3 g/dL — AB (ref 13.0–17.0)
MCH: 31.7 pg (ref 26.0–34.0)
MCHC: 31.9 g/dL (ref 30.0–36.0)
MCV: 99.2 fL (ref 78.0–100.0)
Platelets: 229 10*3/uL (ref 150–400)
RBC: 3.57 MIL/uL — AB (ref 4.22–5.81)
RDW: 14.7 % (ref 11.5–15.5)
WBC: 8.1 10*3/uL (ref 4.0–10.5)

## 2013-04-23 LAB — BASIC METABOLIC PANEL
BUN: 18 mg/dL (ref 6–23)
CALCIUM: 8.8 mg/dL (ref 8.4–10.5)
CHLORIDE: 103 meq/L (ref 96–112)
CO2: 33 meq/L — AB (ref 19–32)
Creatinine, Ser: 1.33 mg/dL (ref 0.50–1.35)
GFR calc Af Amer: 68 mL/min — ABNORMAL LOW (ref >90)
GFR calc non Af Amer: 58 mL/min — ABNORMAL LOW (ref >90)
Glucose, Bld: 100 mg/dL — ABNORMAL HIGH (ref 70–99)
POTASSIUM: 4.1 meq/L (ref 3.7–5.3)
Sodium: 145 mEq/L (ref 137–147)

## 2013-04-23 LAB — HEPARIN LEVEL (UNFRACTIONATED)
HEPARIN UNFRACTIONATED: 0.32 [IU]/mL (ref 0.30–0.70)
Heparin Unfractionated: 0.46 IU/mL (ref 0.30–0.70)

## 2013-04-23 LAB — T4, FREE: FREE T4: 1.69 ng/dL (ref 0.80–1.80)

## 2013-04-23 LAB — POCT ACTIVATED CLOTTING TIME
Activated Clotting Time: 249 seconds
Activated Clotting Time: 287 seconds
Activated Clotting Time: 171 seconds

## 2013-04-23 LAB — T3, FREE: T3, Free: 3.6 pg/mL (ref 2.3–4.2)

## 2013-04-23 LAB — PROTIME-INR
INR: 1.47 (ref 0.00–1.49)
Prothrombin Time: 17.4 seconds — ABNORMAL HIGH (ref 11.6–15.2)

## 2013-04-23 SURGERY — LEFT AND RIGHT HEART CATHETERIZATION WITH CORONARY ANGIOGRAM
Anesthesia: LOCAL

## 2013-04-23 MED ORDER — NITROGLYCERIN 0.2 MG/ML ON CALL CATH LAB
INTRAVENOUS | Status: AC
  Filled 2013-04-23: qty 1

## 2013-04-23 MED ORDER — PRASUGREL HCL 10 MG PO TABS
ORAL_TABLET | ORAL | Status: AC
  Filled 2013-04-23: qty 5

## 2013-04-23 MED ORDER — HEPARIN (PORCINE) IN NACL 100-0.45 UNIT/ML-% IJ SOLN
1150.0000 [IU]/h | INTRAMUSCULAR | Status: DC
  Administered 2013-04-23: 17:00:00 1150 [IU]/h via INTRAVENOUS
  Filled 2013-04-23 (×2): qty 250

## 2013-04-23 MED ORDER — ASPIRIN 81 MG PO CHEW: 81.0000 mg | CHEWABLE_TABLET | Freq: Every day | ORAL | Status: DC

## 2013-04-23 MED ORDER — PRASUGREL HCL 10 MG PO TABS: 10.0000 mg | ORAL_TABLET | Freq: Every day | ORAL | Status: DC

## 2013-04-23 MED ORDER — ACETAMINOPHEN 325 MG PO TABS: 650.0000 mg | ORAL_TABLET | ORAL | Status: DC | PRN

## 2013-04-23 MED ORDER — FENTANYL CITRATE 0.05 MG/ML IJ SOLN
INTRAMUSCULAR | Status: AC
  Filled 2013-04-23: qty 2

## 2013-04-23 MED ORDER — MIDAZOLAM HCL 2 MG/2ML IJ SOLN
INTRAMUSCULAR | Status: AC
  Filled 2013-04-23: qty 2

## 2013-04-23 MED ORDER — LIDOCAINE HCL (PF) 1 % IJ SOLN
INTRAMUSCULAR | Status: AC
  Filled 2013-04-23: qty 30

## 2013-04-23 MED ORDER — HEPARIN (PORCINE) IN NACL 2-0.9 UNIT/ML-% IJ SOLN
INTRAMUSCULAR | Status: AC
  Filled 2013-04-23: qty 1000

## 2013-04-23 MED ORDER — HEPARIN SODIUM (PORCINE) 1000 UNIT/ML IJ SOLN
INTRAMUSCULAR | Status: AC
  Filled 2013-04-23: qty 1

## 2013-04-23 MED ORDER — HEPARIN (PORCINE) IN NACL 100-0.45 UNIT/ML-% IJ SOLN
1150.0000 [IU]/h | INTRAMUSCULAR | Status: DC
  Filled 2013-04-23: qty 250

## 2013-04-23 MED ORDER — ONDANSETRON HCL 4 MG/2ML IJ SOLN: 4.0000 mg | Freq: Four times a day (QID) | INTRAMUSCULAR | Status: DC | PRN

## 2013-04-23 MED ORDER — SODIUM CHLORIDE 0.9 % IV SOLN
1.0000 mL/kg/h | INTRAVENOUS | Status: AC
Start: 2013-04-23 — End: 2013-04-23
  Administered 2013-04-23: 15:00:00 1 mL/kg/h via INTRAVENOUS

## 2013-04-23 MED ORDER — PRASUGREL HCL 10 MG PO TABS
ORAL_TABLET | ORAL | Status: AC
  Filled 2013-04-23: qty 1

## 2013-04-23 MED ORDER — VERAPAMIL HCL 2.5 MG/ML IV SOLN
INTRAVENOUS | Status: AC
  Filled 2013-04-23: qty 2

## 2013-04-23 MED ORDER — FUROSEMIDE 40 MG PO TABS
40.0000 mg | ORAL_TABLET | Freq: Two times a day (BID) | ORAL | Status: DC
  Administered 2013-04-24: 10:00:00 40 mg via ORAL
  Filled 2013-04-23 (×3): qty 1

## 2013-04-23 NOTE — Progress Notes (Signed)
Patient wearing home CPAP unit.

## 2013-04-23 NOTE — Interval H&P Note (Signed)
Cath Lab Visit (complete for each Cath Lab visit)  Clinical Evaluation Leading to the Procedure:   ACS: yes  Non-ACS:    Anginal Classification: CCS IV  Anti-ischemic medical therapy: Maximal Therapy (2 or more classes of medications)  Non-Invasive Test Results: No non-invasive testing performed  Prior CABG: No previous CABG      History and Physical Interval Note:  04/23/2013 11:20 AM  Danny [name]  has presented today for surgery, with the diagnosis of hf/angina  The various methods of treatment have been discussed with the patient and family. After consideration of risks, benefits and other options for treatment, the patient has consented to  Procedure(s): LEFT AND RIGHT HEART CATHETERIZATION WITH CORONARY ANGIOGRAM (N/A) as a surgical intervention .  The patient's history has been reviewed, patient examined, no change in status, stable for surgery.  I have reviewed the patient's chart and labs.  Questions were answered to the patient's satisfaction.     Danny [name] S.

## 2013-04-23 NOTE — Progress Notes (Signed)
Name: Danny [name] MRN: [id number] DOB: October 14, 1955    ADMISSION DATE:  04/19/2013 CONSULTATION DATE:  04/20/13   REFERRING MD :  Dr Denice Bors PRIMARY SERVICE: [facility]   CHIEF COMPLAINT: Chest pain  BRIEF PATIENT DESCRIPTION:  58 yo former smoker admitted with retrosternal chest pain and dyspnea.  He is followed by Dr. Delton Coombes for GOLD IV COPD with bullous emphysema and chronic respiratory failure.  STUDIES: 03/07/13 PFT >> FEV1 0.85 (29%), FEV1% 43, TLC 4.20 (65%), DLCO 27% 03/31/13 Echo >> EF 50 to 55%, grade 1 diastolic dysfx, severe RV dilation, PAS 51 mmHg 04/23/13 Cardiac cath >>   SUBJECTIVE:  Feeling much better since admit. On 3 l [place] with O2 sats 90%  VITAL SIGNS: Temp:  [97.6 F (36.4 C)-98.3 F (36.8 C)] 98 F (36.7 C) (01/21 0738) Pulse Rate:  [61-73] 66 (01/21 0738) Resp:  [18] 18 (01/21 0738) BP: (95-120)/(65-80) 110/68 mmHg (01/21 0738) SpO2:  [91 %-99 %] 95 % (01/21 0738) Weight:  [165 lb 9.1 oz (75.1 kg)] 165 lb 9.1 oz (75.1 kg) (01/21 0511) 02 rx = 3lpm ->88% INTAKE / OUTPUT: Intake/Output     01/20 0701 - 01/21 0700 01/21 0701 - 01/22 0700   P.O. 720    Total Intake(mL/kg) 720 (9.6)    Urine (mL/kg/hr) 700 (0.4)    Total Output 700     Net +20          Urine Occurrence 1 x    Stool Occurrence 1 x      PHYSICAL EXAMINATION: General:  Pleasant bm nad, lying in bed, nad HEENT   Oropharynx no thrush or excess pnd or cobblestoning.  No JVD or cervical adenopathy. Decrease air movement.  Regular rate and rhythm without murmur gallop or rub .  Off IV amio Abd: no hsm, nl excursion.  Ext warm without cyanosis or clubbing.    LABS:  CBC  Recent Labs Lab 04/21/13 0325 04/22/13 0432 04/23/13 0453  WBC 8.8 8.8 8.1  HGB 12.5* 11.7* 11.3*  HCT 37.7* 36.9* 35.4*  PLT 225 229 229   Coag's  Recent Labs Lab 04/22/13 0432 04/22/13 2051 04/23/13 0453  INR 2.00* 1.44 1.47   BMET  Recent Labs Lab 04/21/13 0325 04/22/13 0432  04/23/13 0453  NA 143 144 145  K 4.2 3.4* 4.1  CL 101 102 103  CO2 31 32 33*  BUN 26* 23 18  CREATININE 1.22 1.27 1.33  GLUCOSE 159* 108* 100*   Electrolytes  Recent Labs Lab 04/21/13 0325 04/22/13 0432 04/23/13 0453  CALCIUM 9.2 8.7 8.8   Cardiac Enzymes  Recent Labs Lab 04/19/13 1332 04/20/13 0025 04/20/13 0501 04/20/13 1135  TROPONINI  --  <0.30 <0.30 <0.30  PROBNP 2727.0*  --   --   --    Imaging No results found.      ASSESSMENT:  58 yo former smoker with retrosternal chest pain and dyspnea >> concern for cardiac cause.  Has hx of GOLD 4 COPD with bullous emphysema on chronic prednisone, and chronic respiratory failure.  A: COPD/emphysema. Chronic respiratory failure. P: - continue brovana, pulmicort, prednisone and prn xopenex  A: Chest pain. Hx of CAD, a fib, RV thrombus, HTN. P: -f/u cardiac cath results -continue amiodarone, ASA, catapres, digoxin, lasix, cozaar, lopressor, effient, altace, crestor per cardiology  Brett Canales Minor ACNP [facility] Pager [phone] till 3 pm If no answer page [phone] 04/23/2013, 8:53 AM  Reviewed, and agree.  Continue current respiratory medications.  F/u Cardiac cath.  Coralyn HellingVineet Kayceon Oki, MD [facility] 04/23/2013, 2:23 PM Pager:  [phone] After 3pm call: [phone]

## 2013-04-23 NOTE — CV Procedure (Signed)
PROCEDURE:  Right heart catheterization, Left heart catheterization with selective coronary angiography, PCI of the mid right coronary artery with a drug-eluting stent.  INDICATIONS:  Unstable angina, coronary artery disease  The risks, benefits, and details of the procedure were explained to the patient.  The patient verbalized understanding and wanted to proceed.  Informed written consent was obtained.  PROCEDURE TECHNIQUE:  After Xylocaine anesthesia a 7 French sheath was placed in the right femoral vein and a 8F slender sheath was placed in the right radial artery with a single anterior needle wall stick.   A Swan-Ganz catheter was used to attempt right heart catheterization. We could not navigate past the right ventricle. We then tried a multipurpose catheter which was unsuccessful. We then tried S. tip Swan-Ganz catheter. This was successful in navigating into the pulmonary artery. PA saturation was performed. Hemodynamics were obtained.   IV heparin was given. Right coronary angiography was done using a Judkins R4 guide catheter.  Left coronary angiography was done using a Judkins L3.5 guide catheter.  The intervention was performed. Just before the intervention, the JR 4 guiding catheter was used to perform left heart catheterization. Please see below for details of the intervention.  A TR band was used for hemostasis to the right radial sheath. Hemostasis will be obtained with compression for the right femoral vein sheath.   CONTRAST:  Total of 130 cc.  COMPLICATIONS:  None.    HEMODYNAMICS:  Aortic pressure was 100/73; LV pressure was 100/10; LVEDP 20.  There was no gradient between the left ventricle and aorta. Mean RA pressure 17 mm mercury ; RV pressure 70/18, RVEDP 22 mm Hg; PA pressures 73/52, PA saturation 54%. Aortic saturation 92%. Cardiac output 5.8 L per minute; cardiac index 3.09.  ANGIOGRAPHIC DATA:   The left main coronary artery is widely patent.  The left  anterior descending artery is a large vessel which wraps around the apex. There is mild atherosclerosis throughout the LAD without any significant stenosis. The first diagonal is medium-sized and widely patent.  There are 2 small diagonals which are patent..  The left circumflex artery is a medium size vessel. The mid vessel stent is widely patent. The remainder of the circumflex is small. There is a medium-sized OM1 which extends across the lateral wall. There is a small and 2 which also extends across the lateral wall. Both are patent.  The right coronary artery is a large dominant vessel. There is an early bifurcation of the posterior descending artery and posterolateral artery. This occurs at the mid vessel. Just before this trifurcation along with an RV marginal, there is a focal, hazy, 80% stenosis. The posterior lateral artery and the posterior descending artery are medium size and widely patent.  LEFT VENTRICULOGRAM:  Left ventricular angiogram was not done.  LVEDP was 20 mmHg.  PCI NARRATIVE: A JR 4 guiding catheter was used to engage the right coronary artery. Heparin was used for anticoagulation. The patient was already taking Effient.  ACT was used to check that the heparin was therapeutic.  A pro-water wire was placed across the area disease in the mid right coronary artery. A 2.0 x 12 balloon was used to predilate. A 2.5 x 16 promise drug-eluting stent was deployed. The stent was post dilated with several inflations from a 3.0 x 12 mm noncompliant balloon. A dose of nitroglycerin was given. There was an excellent angiographic result. TIMI-3 flow was maintained throughout. There is no residual stenosis.  IMPRESSIONS:  1. Widely patent left main coronary artery. 2. Widely patent left anterior descending artery and its branches. 3. Widely patent left circumflex artery and its branches. Patent stents in the mid circumflex. 4. Focal, 80% hazy stenosis in the mid right coronary artery.  This  was successfully treated with a 2.5 x 16 promise drug-eluting stent, postdilated to greater than 3 mm in diameter. 5.  LVEDP 20 mmHg.  Ejection fraction 50-55 % by recent echocardiogram. 6.   Severe pulmonary hypertension by right heart cath. It is likely related to his COPD.  RECOMMENDATION:  We would continue dual antiplatelet therapy for at least a year, typically since he had a drug-eluting stent.  Given that he requires Coumadin, he may end up being on FE and in Coumadin without aspirin. This would be reasonable. The other option would be clopidogrel and Coumadin without aspirin. A short course of triple therapy is not unreasonable but if continued, may increase his risk of bleeding long-term. Continue aggressive secondary prevention. Cardiac rehabilitation was recommended. He'll be watched overnight.

## 2013-04-23 NOTE — Progress Notes (Signed)
TR BAND REMOVAL  LOCATION:  right radial  DEFLATED PER PROTOCOL:  yes  TIME BAND OFF / DRESSING APPLIED:   1730   SITE UPON ARRIVAL:   Level 0  SITE AFTER BAND REMOVAL:  Level 0  REVERSE ALLEN'S TEST:    positive  CIRCULATION SENSATION AND MOVEMENT:  Within Normal Limits  yes  COMMENTS:    

## 2013-04-23 NOTE — Progress Notes (Signed)
TR BAND REMOVAL  LOCATION:  right radial  DEFLATED PER PROTOCOL:  yes  TIME BAND OFF / DRESSING APPLIED:   1700   SITE UPON ARRIVAL:   Level 2  SITE AFTER BAND REMOVAL:  Level 2  REVERSE ALLEN'S TEST:    positive  CIRCULATION SENSATION AND MOVEMENT:  Within Normal Limits  yes  COMMENTS:  Hematoma upon arrival; pressure applied intermittently for 30 minutes.

## 2013-04-23 NOTE — Progress Notes (Signed)
Pt. Has his home CPAP set up at the bedside via nasal mask. Pt. Stated that he would place himself on CPAP before going to bed. Pt. Was made aware that if he needed assistance with CPAP to have RN call RT.

## 2013-04-23 NOTE — Progress Notes (Signed)
ANTICOAGULATION CONSULT NOTE - Follow Up Consult  Pharmacy Consult for heparin Indication: atrial fibrillation  Labs:  Recent Labs  04/21/13 0325 04/22/13 0432 04/22/13 1830 04/22/13 2051 04/23/13 0453 04/23/13 2319  HGB 12.5* 11.7*  --   --  11.3*  --   HCT 37.7* 36.9*  --   --  35.4*  --   PLT 225 229  --   --  229  --   LABPROT 26.8* 22.1*  --  17.2* 17.4*  --   INR 2.58* 2.00*  --  1.44 1.47  --   HEPARINUNFRC  --   --  0.20*  --  0.32 0.46  CREATININE 1.22 1.27  --   --  1.33  --     Assessment/Plan:  58yo male therapeutic on heparin after resumed post-cath.  Will continue gtt at current rate and confirm stable with am labs.  Vernard Gambles, PharmD, BCPS  04/23/2013,11:47 PM

## 2013-04-23 NOTE — Progress Notes (Addendum)
ANTICOAGULATION CONSULT NOTE   Pharmacy Consult for Heparin Indication: CP - on Coumadin PTA -- bridge for cath  Allergies  Allergen Reactions  . Lipitor [Atorvastatin] Other (See Comments)    Muscle spasms    Labs:  Recent Labs  04/20/13 1135  04/21/13 0325 04/22/13 0432 04/22/13 1830 04/22/13 2051 04/23/13 0453  HGB  --   < > 12.5* 11.7*  --   --  11.3*  HCT  --   --  37.7* 36.9*  --   --  35.4*  PLT  --   --  225 229  --   --  229  LABPROT  --   < > 26.8* 22.1*  --  17.2* 17.4*  INR  --   < > 2.58* 2.00*  --  1.44 1.47  HEPARINUNFRC  --   --   --   --  0.20*  --  0.32  CREATININE  --   --  1.22 1.27  --   --  1.33  TROPONINI <0.30  --   --   --   --   --   --   < > = values in this interval not displayed.  Estimated Creatinine Clearance: 58 ml/min (by C-G formula based on Cr of 1.33).  Assessment: 58 year old male on Coumadin PTA for Afib.  Coumadin on hold in preparation for cath.  Pharmacy asked to begin IV heparin.  INR = 1.47 today - to cath  Goal of Therapy:  Heparin level 0.3-0.7 units/ml Monitor platelets by anticoagulation protocol: Yes   Plan:  1) Continue heparin at 1150 units / hr 2) Follow up after cath  Thank you. Okey Regal, PharmD [phone]  04/23/2013,9:28 AM

## 2013-04-23 NOTE — Progress Notes (Signed)
Subjective: No further chest tightness. Breathing is better today.  Desats easily with ambulation.  Objective: Vital signs in last 24 hours: Temp:  [97.6 F (36.4 C)-98.3 F (36.8 C)] 98 F (36.7 C) (01/21 0738) Pulse Rate:  [61-73] 66 (01/21 0738) Resp:  [18] 18 (01/21 0738) BP: (95-120)/(65-80) 110/68 mmHg (01/21 0738) SpO2:  [91 %-99 %] 92 % (01/21 0903) Weight:  [165 lb 9.1 oz (75.1 kg)] 165 lb 9.1 oz (75.1 kg) (01/21 0511) Last BM Date: 04/21/13  Intake/Output from previous day: 01/20 0701 - 01/21 0700 In: 720 [P.O.:720] Out: 700 [Urine:700] Intake/Output this shift:    Medications Current Facility-Administered Medications  Medication Dose Route Frequency Provider Last Rate Last Dose  . 0.9 %  sodium chloride infusion  250 mL Intravenous PRN Brittainy Simmons, PA-C      . 0.9 %  sodium chloride infusion   Intravenous Continuous Brittainy Simmons, PA-C      . 0.9 %  sodium chloride infusion  250 mL Intravenous PRN Mariene Dickerman M [place], MD      . 0.9 %  sodium chloride infusion   Intravenous Continuous Donn Wilmot M [place], MD 10 mL/hr at 04/23/13 0653 10 mL/hr at 04/23/13 0653  . acetaminophen (TYLENOL) tablet 650 mg  650 mg Oral Q4H PRN Thurmon Fair, MD   650 mg at 04/23/13 0604  . ALPRAZolam Prudy Feeler) tablet 0.5 mg  0.5 mg Oral Once Haydee Salter, MD      . amiodarone (PACERONE) tablet 200 mg  200 mg Oral Q12H Mihai Croitoru, MD   200 mg at 04/22/13 2153   Followed by  . [START ON 04/27/2013] amiodarone (PACERONE) tablet 200 mg  200 mg Oral Daily Mihai Croitoru, MD      . arformoterol (BROVANA) nebulizer solution 15 mcg  15 mcg Nebulization BID Thurmon Fair, MD   15 mcg at 04/23/13 0902  . aspirin EC tablet 81 mg  81 mg Oral Daily Mihai Croitoru, MD   81 mg at 04/23/13 0927  . budesonide (PULMICORT) nebulizer solution 0.25 mg  0.25 mg Nebulization BID Mihai Croitoru, MD   0.25 mg at 04/23/13 0902  . cloNIDine (CATAPRES) tablet 0.1 mg  0.1 mg Oral TID Thurmon Fair, MD   0.1 mg at  04/22/13 2152  . clotrimazole (MYCELEX) lozenge 10 mg  10 mg Oral Daily Mihai Croitoru, MD   10 mg at 04/22/13 1021  . digoxin (LANOXIN) tablet 0.125 mg  0.125 mg Oral q morning - 10a Mihai Croitoru, MD   0.125 mg at 04/22/13 1021  . furosemide (LASIX) tablet 40 mg  40 mg Oral BID Timonthy Hovater M [place], MD   40 mg at 04/23/13 5397  . heparin ADULT infusion 100 units/mL (25000 units/250 mL)  1,150 Units/hr Intravenous Continuous Mihai Croitoru, MD 11.5 mL/hr at 04/23/13 0518 1,150 Units/hr at 04/23/13 0518  . levalbuterol (XOPENEX) nebulizer solution 1.25 mg  1.25 mg Nebulization Q6H Mihai Croitoru, MD   1.25 mg at 04/23/13 0902  . levalbuterol (XOPENEX) nebulizer solution 1.25 mg  1.25 mg Nebulization Q4H PRN Mihai Croitoru, MD      . losartan (COZAAR) tablet 100 mg  100 mg Oral Daily Mihai Croitoru, MD   100 mg at 04/22/13 1020  . metoprolol tartrate (LOPRESSOR) tablet 75 mg  75 mg Oral BID Kathelyn Gombos M [place], MD   75 mg at 04/22/13 2152  . nitroGLYCERIN (NITROSTAT) SL tablet 0.4 mg  0.4 mg Sublingual Q5 Min x 3 PRN Thurmon Fair, MD      .  ondansetron (ZOFRAN) injection 4 mg  4 mg Intravenous Q6H PRN Mihai Croitoru, MD      . pantoprazole (PROTONIX) EC tablet 40 mg  40 mg Oral Daily Mihai Croitoru, MD   40 mg at 04/22/13 1020  . prasugrel (EFFIENT) tablet 10 mg  10 mg Oral Daily Mihai Croitoru, MD   10 mg at 04/23/13 0823  . predniSONE (DELTASONE) tablet 5 mg  5 mg Oral Q breakfast Mihai Croitoru, MD   5 mg at 04/23/13 0824  . ramipril (ALTACE) capsule 2.5 mg  2.5 mg Oral Daily Mihai Croitoru, MD   2.5 mg at 04/22/13 1020  . rosuvastatin (CRESTOR) tablet 10 mg  10 mg Oral Daily Mihai Croitoru, MD   10 mg at 04/22/13 1021  . sodium chloride 0.9 % injection 3 mL  3 mL Intravenous Q12H Brittainy Simmons, PA-C   3 mL at 04/22/13 2153  . sodium chloride 0.9 % injection 3 mL  3 mL Intravenous PRN Brittainy Simmons, PA-C      . sodium chloride 0.9 % injection 3 mL  3 mL Intravenous Q12H Chenee Munns M [place], MD      .  sodium chloride 0.9 % injection 3 mL  3 mL Intravenous PRN Karsen Fellows M [place], MD        PE: General appearance: alert, cooperative and no distress Lungs: decreased breath sounds bilaterally R>L Heart: irregular rate and rhythm, tachy Extremities: 2+ LEE R>L Pulses: 2+ and symmetric Skin: warm and dry Neurologic: Grossly normal  Lab Results:   Recent Labs  04/21/13 0325 04/22/13 0432 04/23/13 0453  WBC 8.8 8.8 8.1  HGB 12.5* 11.7* 11.3*  HCT 37.7* 36.9* 35.4*  PLT 225 229 229   BMET  Recent Labs  04/21/13 0325 04/22/13 0432 04/23/13 0453  NA 143 144 145  K 4.2 3.4* 4.1  CL 101 102 103  CO2 31 32 33*  GLUCOSE 159* 108* 100*  BUN 26* 23 18  CREATININE 1.22 1.27 1.33  CALCIUM 9.2 8.7 8.8   PT/INR  Recent Labs  04/22/13 0432 04/22/13 2051 04/23/13 0453  LABPROT 22.1* 17.2* 17.4*  INR 2.00* 1.44 1.47   Cholesterol No results found for this basename: CHOL,  in the last 72 hours Cardiac Panel (last 3 results)  Recent Labs  04/20/13 1135  TROPONINI <0.30     Assessment/Plan  Active Problems:   Multifocal atrial tachycardia   Unstable angina pectoris   PAT (paroxysmal atrial tachycardia)  1. Unstable angina. Cardiac enzymes are negative. S/p stenting of OM Jan 2014 with DES. Currently on Effient and ASA. If cath negative would DC Effient. Angina may be due to demand ischemia from severe COPD and CHF. INR is down to 1.4 today. OK to proceed with heart cath. 2. MAT. HR much improved with switch to metoprolol. Although bisoprolol is more beta selective this resulted in poor HR control. Also on low dose amiodarone. 3. CHF acute on chronic diastolic- EF 50-55% by Echo. History of severe RV dysfunction. Will await results of R and L heart cath today. Lasix increased to bid yesterday. Will use cath data to guide therapy. 4. COPD- O2 dependent. 5. History of RV thrombus. Not noted on recent Echo. Safe to proceed with right heart cath. Will plan to resume coumadin  post procedure.    LOS: 4 days      Theron Aristaeter Str[facility] Gi Endoscopy CenterJordanMD,FACC  04/23/2013 9:35 AM

## 2013-04-23 NOTE — H&P (View-Only) (Signed)
Subjective: No further chest tightness. Breathing is better today.  Desats easily with ambulation.  Objective: Vital signs in last 24 hours: Temp:  [97.6 F (36.4 C)-98.3 F (36.8 C)] 98 F (36.7 C) (01/21 0738) Pulse Rate:  [61-73] 66 (01/21 0738) Resp:  [18] 18 (01/21 0738) BP: (95-120)/(65-80) 110/68 mmHg (01/21 0738) SpO2:  [91 %-99 %] 92 % (01/21 0903) Weight:  [165 lb 9.1 oz (75.1 kg)] 165 lb 9.1 oz (75.1 kg) (01/21 0511) Last BM Date: 04/21/13  Intake/Output from previous day: 01/20 0701 - 01/21 0700 In: 720 [P.O.:720] Out: 700 [Urine:700] Intake/Output this shift:    Medications Current Facility-Administered Medications  Medication Dose Route Frequency Provider Last Rate Last Dose  . 0.9 %  sodium chloride infusion  250 mL Intravenous PRN Brittainy Simmons, PA-C      . 0.9 %  sodium chloride infusion   Intravenous Continuous Brittainy Simmons, PA-C      . 0.9 %  sodium chloride infusion  250 mL Intravenous PRN Fina Heizer M [place], MD      . 0.9 %  sodium chloride infusion   Intravenous Continuous Molina Hollenback M [place], MD 10 mL/hr at 04/23/13 0653 10 mL/hr at 04/23/13 0653  . acetaminophen (TYLENOL) tablet 650 mg  650 mg Oral Q4H PRN Thurmon Fair, MD   650 mg at 04/23/13 0604  . ALPRAZolam Prudy Feeler) tablet 0.5 mg  0.5 mg Oral Once Haydee Salter, MD      . amiodarone (PACERONE) tablet 200 mg  200 mg Oral Q12H Mihai Croitoru, MD   200 mg at 04/22/13 2153   Followed by  . [START ON 04/27/2013] amiodarone (PACERONE) tablet 200 mg  200 mg Oral Daily Mihai Croitoru, MD      . arformoterol (BROVANA) nebulizer solution 15 mcg  15 mcg Nebulization BID Thurmon Fair, MD   15 mcg at 04/23/13 0902  . aspirin EC tablet 81 mg  81 mg Oral Daily Mihai Croitoru, MD   81 mg at 04/23/13 0927  . budesonide (PULMICORT) nebulizer solution 0.25 mg  0.25 mg Nebulization BID Mihai Croitoru, MD   0.25 mg at 04/23/13 0902  . cloNIDine (CATAPRES) tablet 0.1 mg  0.1 mg Oral TID Thurmon Fair, MD   0.1 mg at  04/22/13 2152  . clotrimazole (MYCELEX) lozenge 10 mg  10 mg Oral Daily Mihai Croitoru, MD   10 mg at 04/22/13 1021  . digoxin (LANOXIN) tablet 0.125 mg  0.125 mg Oral q morning - 10a Mihai Croitoru, MD   0.125 mg at 04/22/13 1021  . furosemide (LASIX) tablet 40 mg  40 mg Oral BID Chloee Tena M [place], MD   40 mg at 04/23/13 5397  . heparin ADULT infusion 100 units/mL (25000 units/250 mL)  1,150 Units/hr Intravenous Continuous Mihai Croitoru, MD 11.5 mL/hr at 04/23/13 0518 1,150 Units/hr at 04/23/13 0518  . levalbuterol (XOPENEX) nebulizer solution 1.25 mg  1.25 mg Nebulization Q6H Mihai Croitoru, MD   1.25 mg at 04/23/13 0902  . levalbuterol (XOPENEX) nebulizer solution 1.25 mg  1.25 mg Nebulization Q4H PRN Mihai Croitoru, MD      . losartan (COZAAR) tablet 100 mg  100 mg Oral Daily Mihai Croitoru, MD   100 mg at 04/22/13 1020  . metoprolol tartrate (LOPRESSOR) tablet 75 mg  75 mg Oral BID Daeja Helderman M [place], MD   75 mg at 04/22/13 2152  . nitroGLYCERIN (NITROSTAT) SL tablet 0.4 mg  0.4 mg Sublingual Q5 Min x 3 PRN Thurmon Fair, MD      .  ondansetron (ZOFRAN) injection 4 mg  4 mg Intravenous Q6H PRN Mihai Croitoru, MD      . pantoprazole (PROTONIX) EC tablet 40 mg  40 mg Oral Daily Mihai Croitoru, MD   40 mg at 04/22/13 1020  . prasugrel (EFFIENT) tablet 10 mg  10 mg Oral Daily Mihai Croitoru, MD   10 mg at 04/23/13 0823  . predniSONE (DELTASONE) tablet 5 mg  5 mg Oral Q breakfast Mihai Croitoru, MD   5 mg at 04/23/13 0824  . ramipril (ALTACE) capsule 2.5 mg  2.5 mg Oral Daily Mihai Croitoru, MD   2.5 mg at 04/22/13 1020  . rosuvastatin (CRESTOR) tablet 10 mg  10 mg Oral Daily Mihai Croitoru, MD   10 mg at 04/22/13 1021  . sodium chloride 0.9 % injection 3 mL  3 mL Intravenous Q12H Brittainy Simmons, PA-C   3 mL at 04/22/13 2153  . sodium chloride 0.9 % injection 3 mL  3 mL Intravenous PRN Brittainy Simmons, PA-C      . sodium chloride 0.9 % injection 3 mL  3 mL Intravenous Q12H Deep Bonawitz M [place], MD      .  sodium chloride 0.9 % injection 3 mL  3 mL Intravenous PRN Barbra Miner M Laurena Valko, MD        PE: General appearance: alert, cooperative and no distress Lungs: decreased breath sounds bilaterally R>L Heart: irregular rate and rhythm, tachy Extremities: 2+ LEE R>L Pulses: 2+ and symmetric Skin: warm and dry Neurologic: Grossly normal  Lab Results:   Recent Labs  04/21/13 0325 04/22/13 0432 04/23/13 0453  WBC 8.8 8.8 8.1  HGB 12.5* 11.7* 11.3*  HCT 37.7* 36.9* 35.4*  PLT 225 229 229   BMET  Recent Labs  04/21/13 0325 04/22/13 0432 04/23/13 0453  NA 143 144 145  K 4.2 3.4* 4.1  CL 101 102 103  CO2 31 32 33*  GLUCOSE 159* 108* 100*  BUN 26* 23 18  CREATININE 1.22 1.27 1.33  CALCIUM 9.2 8.7 8.8   PT/INR  Recent Labs  04/22/13 0432 04/22/13 2051 04/23/13 0453  LABPROT 22.1* 17.2* 17.4*  INR 2.00* 1.44 1.47   Cholesterol No results found for this basename: CHOL,  in the last 72 hours Cardiac Panel (last 3 results)  Recent Labs  04/20/13 1135  TROPONINI <0.30     Assessment/Plan  Active Problems:   Multifocal atrial tachycardia   Unstable angina pectoris   PAT (paroxysmal atrial tachycardia)  1. Unstable angina. Cardiac enzymes are negative. S/p stenting of OM Jan 2014 with DES. Currently on Effient and ASA. If cath negative would DC Effient. Angina may be due to demand ischemia from severe COPD and CHF. INR is down to 1.4 today. OK to proceed with heart cath. 2. MAT. HR much improved with switch to metoprolol. Although bisoprolol is more beta selective this resulted in poor HR control. Also on low dose amiodarone. 3. CHF acute on chronic diastolic- EF 50-55% by Echo. History of severe RV dysfunction. Will await results of R and L heart cath today. Lasix increased to bid yesterday. Will use cath data to guide therapy. 4. COPD- O2 dependent. 5. History of RV thrombus. Not noted on recent Echo. Safe to proceed with right heart cath. Will plan to resume coumadin  post procedure.    LOS: 4 days      Lanisa Ishler JordanMD,FACC  04/23/2013 9:35 AM    

## 2013-04-24 ENCOUNTER — Encounter (HOSPITAL_COMMUNITY): Payer: Self-pay | Admitting: Nurse Practitioner

## 2013-04-24 LAB — CBC
HCT: 37.1 % — ABNORMAL LOW (ref 39.0–52.0)
HEMOGLOBIN: 11.7 g/dL — AB (ref 13.0–17.0)
MCH: 31.5 pg (ref 26.0–34.0)
MCHC: 31.5 g/dL (ref 30.0–36.0)
MCV: 99.7 fL (ref 78.0–100.0)
PLATELETS: 237 10*3/uL (ref 150–400)
RBC: 3.72 MIL/uL — ABNORMAL LOW (ref 4.22–5.81)
RDW: 14.9 % (ref 11.5–15.5)
WBC: 8.4 10*3/uL (ref 4.0–10.5)

## 2013-04-24 LAB — BASIC METABOLIC PANEL
BUN: 16 mg/dL (ref 6–23)
CO2: 33 mEq/L — ABNORMAL HIGH (ref 19–32)
Calcium: 9 mg/dL (ref 8.4–10.5)
Chloride: 102 mEq/L (ref 96–112)
Creatinine, Ser: 1.24 mg/dL (ref 0.50–1.35)
GFR calc Af Amer: 73 mL/min — ABNORMAL LOW (ref >90)
GFR, EST NON AFRICAN AMERICAN: 63 mL/min — AB (ref >90)
GLUCOSE: 127 mg/dL — AB (ref 70–99)
POTASSIUM: 3.8 meq/L (ref 3.7–5.3)
Sodium: 145 mEq/L (ref 137–147)

## 2013-04-24 LAB — PROTIME-INR
INR: 1.24 (ref 0.00–1.49)
PROTHROMBIN TIME: 15.3 s — AB (ref 11.6–15.2)

## 2013-04-24 LAB — HEPARIN LEVEL (UNFRACTIONATED): Heparin Unfractionated: 0.55 IU/mL (ref 0.30–0.70)

## 2013-04-24 MED ORDER — PRASUGREL HCL 10 MG PO TABS: 10.0000 mg | ORAL_TABLET | Freq: Every day | ORAL | Status: DC

## 2013-04-24 MED ORDER — AMIODARONE HCL 200 MG PO TABS: 200.0000 mg | ORAL_TABLET | Freq: Every day | ORAL | Status: AC

## 2013-04-24 MED ORDER — WARFARIN SODIUM 5 MG PO TABS
5.0000 mg | ORAL_TABLET | Freq: Once | ORAL | Status: AC
  Administered 2013-04-24: 10:00:00 5 mg via ORAL
  Filled 2013-04-24: qty 1

## 2013-04-24 MED ORDER — PRASUGREL HCL 10 MG PO TABS
10.0000 mg | ORAL_TABLET | Freq: Every day | ORAL | Status: DC
  Administered 2013-04-24: 10 mg via ORAL
  Filled 2013-04-24: qty 1

## 2013-04-24 MED ORDER — NITROGLYCERIN 0.4 MG SL SUBL: 0.4000 mg | SUBLINGUAL_TABLET | SUBLINGUAL | Status: AC | PRN

## 2013-04-24 MED ORDER — METOPROLOL TARTRATE 50 MG PO TABS: 75.0000 mg | ORAL_TABLET | Freq: Two times a day (BID) | ORAL | Status: DC

## 2013-04-24 MED ORDER — WARFARIN - PHYSICIAN DOSING INPATIENT: Freq: Every day | Status: DC

## 2013-04-24 NOTE — Discharge Summary (Signed)
Discharge Summary   Patient ID: JAYCUB NOORANI,  MRN: [id number], DOB/AGE: 1956/01/10 58 y.o.  Admit date: 04/19/2013 Discharge date: 04/24/2013  Primary Care Provider: DONDIEGO,RICHARD M Primary Cardiologist: Ival Bible, MD   Discharge Diagnoses Principal Problem:   Unstable angina pectoris  **s/p PCI/DES of the RCA this admission.  **s/p MI and PCI to the LCX/OM in 05/2012. Active Problems:   Coronary atherosclerosis of native coronary artery   Pulmonary hypertension   RV (right ventricular) mural thrombus  **Chronic coumadin.   Essential hypertension, benign   COPD (chronic obstructive pulmonary disease)  **Home O2.   PAT (paroxysmal atrial tachycardia)  **Amiodarone initiated this admission.   Long term (current) use of anticoagulants  Allergies Allergies  Allergen Reactions  . Lipitor [Atorvastatin] Other (See Comments)    Muscle spasms    Procedures  Cardiac Catheterization 1.21.2015  HEMODYNAMICS:  Aortic pressure was 100/73; LV pressure was 100/10; LVEDP 20.  There was no gradient between the left ventricle and aorta. Mean RA pressure 17 mm mercury ; RV pressure 70/18, RVEDP 22 mm Hg; PA pressures 73/52, PA saturation 54%. Aortic saturation 92%. Cardiac output 5.8 L per minute; cardiac index 3.09.  ANGIOGRAPHIC DATA:     The left main coronary artery is widely patent.  The left anterior descending artery is a large vessel which wraps around the apex. There is mild atherosclerosis throughout the LAD without any significant stenosis. The first diagonal is medium-sized and widely patent.  There are 2 small diagonals which are patent..  The left circumflex artery is a medium size vessel. The mid vessel stent is widely patent. The remainder of the circumflex is small. There is a medium-sized OM1 which extends across the lateral wall. There is a small and 2 which also extends across the lateral wall. Both are patent.  The right coronary artery is a large dominant  vessel. There is an early bifurcation of the posterior descending artery and posterolateral artery. This occurs at the mid vessel. Just before this trifurcation along with an RV marginal, there is a focal, hazy, 80% stenosis. The posterior lateral artery and the posterior descending artery are medium size and widely patent.    ** The RCA was successfully stented using a 2.5 x 16 Promus drug-eluting stent. **  LEFT VENTRICULOGRAM:  Left ventricular angiogram was not done.  LVEDP was 20 mmHg.  IMPRESSIONS:    1. Widely patent left main coronary artery. 2. Widely patent left anterior descending artery and its branches. 3. Widely patent left circumflex artery and its branches. Patent stents in the mid circumflex. 4. Focal, 80% hazy stenosis in the mid right coronary artery.  This was successfully treated with a 2.5 x 16 promise drug-eluting stent, postdilated to greater than 3 mm in diameter. 5. LVEDP 20 mmHg.  Ejection fraction 50-55 % by recent echocardiogram. 6.   Severe pulmonary hypertension by right heart cath. It is likely related to his COPD. _____________   History of Present Illness  58 year old male with a history of CAD s/p MI and stenting of the LCX/OM in early 2014 and severe O2 dependent COPD with pulmonary hypertension/cor pulmonale and right heart failure complicated by RV thrombus requiring chronic coumadin anticoagulation.  In that setting he also has a h/o paroxysmal atrial arrhythmias.  He is pending lung transplant evaluation at [facility].  He was in his usual state of health until several days prior to admission, when he began to experience progressive retrosternal chest pain and dyspnea as  well as tachypalpitations.  He presented to the [facility] ED on 1/17 and was found to be tachycardic with what was felt to be paroxysmal atrial tachycardia.  He was placed on IV amiodarone for rate/rhythm control with improvement in HR.  He was admitted for further evalulation.  Hospital  Course  Patient ruled out for MI.  Due to progressive chest pain and dyspnea, it was felt that he would require diagnostic catheterization and his coumadin was held.  He had no further chest pain but remained intermittently tachycardic.  His beta blocker regimen was transitioned to lopressor (from coreg), and amiodarone was switched from IV to oral.  Due to his history of severe COPD, pulmonology was consulted and followed along throughout his hospitalization.  Inhaler and steroid therapy was continued.  INR was acceptable on 1/21 and diagnostic catheterization was performed and showed patency of the previously placed LCX/OM stent with a new severe stenosis in the RCA.  This was successfully treated using a 2.5 x 16 promus drug-eluting stent.  Patient tolerated procedure well and post-procedure, heparin was resumed.  As he has tolerated dual anticoagulant therapy in the past (effient/coumadin), we have opted to continue this regimen going forward.  He has been ambulating this morning without recurrent chest pain.  He continues to have chronic dyspnea on exertion and desaturations with activity.  His atrial tachycardia has been much better controlled.  He continues to have frequent PVC's but has not had sustained tachycardia since amiodarone was initiated.  We have resumed his coumadin this morning and plan to discharge him home today in good condition.  Because he is on both effient and coumadin, we will not be using aspirin at discharge.  Further, we will not be bridging with lovenox as his echo in December did not show RV thrombus.  We have arranged for follow up in our coumadin clinic next Monday 1/26.  Discharge Vitals Blood pressure 130/73, pulse 65, temperature 98.5 F (36.9 C), temperature source Oral, resp. rate 18, height 5\' 7"  (1.702 m), weight 163 lb 9.3 oz (74.2 kg), SpO2 95.00%.  Filed Weights   04/19/13 2214 04/23/13 0511 04/24/13 0623  Weight: 166 lb 12.8 oz (75.66 kg) 165 lb 9.1 oz (75.1  kg) 163 lb 9.3 oz (74.2 kg)   Labs  CBC  Recent Labs  04/23/13 0453 04/24/13 0435  WBC 8.1 8.4  HGB 11.3* 11.7*  HCT 35.4* 37.1*  MCV 99.2 99.7  PLT 229 237   Basic Metabolic Panel  Recent Labs  04/23/13 0453 04/24/13 0435  NA 145 145  K 4.1 3.8  CL 103 102  CO2 33* 33*  GLUCOSE 100* 127*  BUN 18 16  CREATININE 1.33 1.24  CALCIUM 8.8 9.0   Cardiac Enzymes Lab Results  Component Value Date   TROPONINI <0.30 04/20/2013    Fasting Lipid Panel Lab Results  Component Value Date   CHOL 115 04/20/2013   HDL 32* 04/20/2013   LDLCALC 59 04/20/2013   TRIG 119 04/20/2013   CHOLHDL 3.6 04/20/2013    Thyroid Function Tests  Recent Labs  04/23/13 0453  T3FREE 3.6   Disposition  Pt is being discharged home today in good condition.  Follow-up Plans & Appointments  Follow-up Information   Follow up with Leslye Peer., MD. (as scheduled)    Specialty:  Pulmonary Disease   Contact information:   [address] [place] [address] [phone]       Follow up with Nona Dell, MD On 05/08/2013. (  10:00 AM)    Specialty:  Cardiology   Contact information:   [address] [place] [address] [phone]       Follow up with Coumadin Clinic @ [facility] On 04/28/2013. (11:50 AM)       Follow up with Isabella Stalling, MD. (as scheduled.)    Specialty:  Internal Medicine   Contact information:   [address] [place] [address] [phone]      Discharge Medications    Medication List    STOP taking these medications       carvedilol 25 MG tablet  Commonly known as:  COREG     mupirocin nasal ointment 2 %  Commonly known as:  BACTROBAN     ramipril 2.5 MG capsule  Commonly known as:  ALTACE      TAKE these medications       acetaminophen 500 MG tablet  Commonly known as:  TYLENOL  Take 1,000 mg by mouth every 6 (six) hours as needed for moderate pain.     albuterol 108 (90 BASE) MCG/ACT inhaler   Commonly known as:  PROVENTIL HFA;VENTOLIN HFA  Inhale 2 puffs into the lungs every 6 (six) hours as needed for wheezing or shortness of breath.     amiodarone 200 MG tablet  Commonly known as:  PACERONE  Take 1 tablet (200 mg total) by mouth daily.  Start taking on:  04/27/2013     antiseptic oral rinse Liqd  15 mLs by Mouth Rinse route 4 (four) times daily as needed for dry mouth.     arformoterol 15 MCG/2ML Nebu  Commonly known as:  BROVANA  Take 15 mcg by nebulization 2 (two) times daily.     budesonide 0.25 MG/2ML nebulizer solution  Commonly known as:  PULMICORT  Take 0.25 mg by nebulization 2 (two) times daily.     cloNIDine 0.1 MG tablet  Commonly known as:  CATAPRES  Take 0.1 mg by mouth 3 (three) times daily.     clotrimazole 10 MG troche  Commonly known as:  MYCELEX  Take 10 mg by mouth daily.     digoxin 0.125 MG tablet  Commonly known as:  LANOXIN  Take 0.125 mg by mouth every morning.     furosemide 40 MG tablet  Commonly known as:  LASIX  Take 40-80 mg by mouth daily. *takes an extra 40mg  tablet if needed for swelling     levalbuterol 1.25 MG/0.5ML nebulizer solution  Commonly known as:  XOPENEX  Take 1.25 mg by nebulization every 6 (six) hours.     losartan 100 MG tablet  Commonly known as:  COZAAR  Take 100 mg by mouth every morning.     metoprolol 50 MG tablet  Commonly known as:  LOPRESSOR  Take 1.5 tablets (75 mg total) by mouth 2 (two) times daily.     nitroGLYCERIN 0.4 MG SL tablet  Commonly known as:  NITROSTAT  Place 1 tablet (0.4 mg total) under the tongue every 5 (five) minutes x 3 doses as needed for chest pain.     omeprazole 20 MG capsule  Commonly known as:  PRILOSEC  Take 20 mg by mouth 2 (two) times daily.     potassium chloride 10 MEQ tablet  Commonly known as:  K-DUR  Take 10 mEq by mouth every other day.     prasugrel 10 MG Tabs tablet  Commonly known as:  EFFIENT  Take 1 tablet (10 mg total) by mouth  daily.      predniSONE 5 MG tablet  Commonly known as:  DELTASONE  Take 1 tablet (5 mg total) by mouth daily with breakfast.     rosuvastatin 10 MG tablet  Commonly known as:  CRESTOR  Take 10 mg by mouth every evening.     sodium chloride 0.65 % nasal spray  Commonly known as:  OCEAN  Place 1 spray into the nose every 6 (six) hours as needed for congestion.     warfarin 2.5 MG tablet  Commonly known as:  COUMADIN  Take 2.5-3.75 mg by mouth daily. Take 2.5 mg tablet alternating with 3.75 mg  tablets every other day       Outstanding Labs/Studies  INR on 1/26.  Duration of Discharge Encounter   Greater than 30 minutes including physician time.  Signed, Nicolasa Duckinghristopher Kayliegh Boyers NP 04/24/2013, 9:43 AM

## 2013-04-24 NOTE — Discharge Instructions (Signed)

## 2013-04-24 NOTE — Progress Notes (Signed)
CARDIAC REHAB PHASE I   PRE:  Rate/Rhythm: 61 SR    BP: sitting 130/73    SaO2: 99 3 1/2L  MODE:  Ambulation: 700 ft   POST:  Rate/Rhythm: 78 SR    BP: sitting 148/76     SaO2: 78 6L, up to 92 6L with rest  Pt steady, feels much improved in regards to SOB. SaO2 consistently decreased after approximately 100 ft to mid 80s on 4L. With rest, pursed lip breathing and cessation in talking SaO2 up to 92 4L. Sts he is pleased that his SaO2 responded so quickly. Had pt walk a trial on 6L O2. SaO2 was slower to drop, able to walk approximately 200 ft but still dropped, eventually to 78 by the time he sat in recliner. Increased to 92 on 6L. Put pt back on 3 1/2 L in room. Pt sts he feels well, no CP. Discussed ed, voiced understanding. Pt was to start CRPII in [place] this past Monday. He is familiar with staff and will call to schedule appointment.  [phone]   Elissa Lovett Pierpont CES, ACSM 04/24/2013 9:11 AM

## 2013-04-24 NOTE — Progress Notes (Signed)
Name: Danny [name] MRN: [id number] DOB: December 26, 1955    ADMISSION DATE:  04/19/2013 CONSULTATION DATE:  04/20/13   REFERRING MD :  Dr Denice BorsMdDowell [facility]   CHIEF COMPLAINT: Chest pain  BRIEF PATIENT DESCRIPTION:  58 yo former smoker admitted with retrosternal chest pain and dyspnea.  He is followed by Dr. Delton CoombesByrum for GOLD IV COPD with bullous emphysema and chronic respiratory failure.  STUDIES: 03/07/13 PFT >> FEV1 0.85 (29%), FEV1% 43, TLC 4.20 (65%), DLCO 27% 03/31/13 Echo >> EF 50 to 55%, grade 1 diastolic dysfx, severe RV dilation, PAS 51 mmHg Rt/Lt heart cath 1/21 >> mid RCA stenosis s/p DES placement, PA pressure 73/52, LVEDP 20 mmHg, CI 3.09, EF 50 to 55%  SUBJECTIVE:  Feeling much better since admit. On 3 l [place] with O2 sats 98%  VITAL SIGNS: Temp:  [97.6 F (36.4 C)-98.5 F (36.9 C)] 98.5 F (36.9 C) (01/22 0800) Pulse Rate:  [63-113] 65 (01/22 0800) Resp:  [18-20] 18 (01/22 0800) BP: (94-183)/(60-139) 130/73 mmHg (01/22 0800) SpO2:  [76 %-100 %] 98 % (01/22 0800) Weight:  [163 lb 9.3 oz (74.2 kg)] 163 lb 9.3 oz (74.2 kg) (01/22 0623) 02 rx = 3lpm ->98% INTAKE / OUTPUT: Intake/Output     01/21 0701 - 01/22 0700 01/22 0701 - 01/23 0700   P.O. 360    I.V. (mL/kg) 711.3 (9.6)    Total Intake(mL/kg) 1071.3 (14.4)    Urine (mL/kg/hr)     Total Output       Net +1071.3            PHYSICAL EXAMINATION: General:  Pleasant bm nad, sitting in chair, nad HEENT    No JVD or cervical adenopathy. Decrease air movement. But clear Regular rate and rhythm without murmur gallop or rub .   Abd: no hsm, nl excursion.  Ext warm without cyanosis or clubbing.    LABS:  CBC  Recent Labs Lab 04/22/13 0432 04/23/13 0453 04/24/13 0435  WBC 8.8 8.1 8.4  HGB 11.7* 11.3* 11.7*  HCT 36.9* 35.4* 37.1*  PLT 229 229 237   Coag's  Recent Labs Lab 04/22/13 2051 04/23/13 0453 04/24/13 0435  INR 1.44 1.47 1.24   BMET  Recent Labs Lab  04/22/13 0432 04/23/13 0453 04/24/13 0435  NA 144 145 145  K 3.4* 4.1 3.8  CL 102 103 102  CO2 32 33* 33*  BUN 23 18 16   CREATININE 1.27 1.33 1.24  GLUCOSE 108* 100* 127*   Electrolytes  Recent Labs Lab 04/22/13 0432 04/23/13 0453 04/24/13 0435  CALCIUM 8.7 8.8 9.0   Cardiac Enzymes  Recent Labs Lab 04/19/13 1332 04/20/13 0025 04/20/13 0501 04/20/13 1135  TROPONINI  --  <0.30 <0.30 <0.30  PROBNP 2727.0*  --   --   --    Imaging No results found.      ASSESSMENT:  58 yo former smoker with retrosternal chest pain and dyspnea >> concern for cardiac cause.  Has hx of GOLD 4 COPD with bullous emphysema on chronic prednisone, and chronic respiratory failure.  A: COPD/emphysema. Chronic respiratory failure. P: - continue brovana, pulmicort, prednisone and prn xopenex  A: Chest pain >> s/p DES insertion to RCA 1/21. Hx of CAD, a fib, RV thrombus, HTN. P: -per cardiology  Brett CanalesSteve Minor ACNP [facility] P[facility] Pager [phone] till 3 pm If no answer page [phone] 04/24/2013, 9:11 AM  Reviewed above, examined pt, and agree.  Pulmonary regimen stable.  He has follow  up appt with Dr. Delton Coombes scheduled for 05/22/13.  PCCM will sign off.  Please call if additional help needed while pt is in hospital.  Coralyn Helling, MD [facility] Pulmonary/Critical Care 04/24/2013, 9:52 AM Pager:  [phone] After 3pm call: [phone]

## 2013-04-24 NOTE — Progress Notes (Signed)
Patient Name: Danny [name] Date of Encounter: 04/24/2013   Principal Problem:   Unstable angina pectoris Active Problems:   Coronary atherosclerosis of native coronary artery   RV (right ventricular) mural thrombus   Essential hypertension, benign   COPD (chronic obstructive pulmonary disease)   PAT (paroxysmal atrial tachycardia)   Long term (current) use of anticoagulants    SUBJECTIVE  No chest pain or sob.  Awoke suddenly around 3AM and was noted to be bradycardic by nsg staff, briefly in the 30's.  He does wear cpap.  CURRENT MEDS . ALPRAZolam  0.5 mg Oral Once  . amiodarone  200 mg Oral Q12H   Followed by  . [START ON 04/27/2013] amiodarone  200 mg Oral Daily  . arformoterol  15 mcg Nebulization BID  . aspirin EC  81 mg Oral Daily  . budesonide  0.25 mg Nebulization BID  . cloNIDine  0.1 mg Oral TID  . clotrimazole  10 mg Oral Daily  . digoxin  0.125 mg Oral q morning - 10a  . furosemide  40 mg Oral BID  . levalbuterol  1.25 mg Nebulization Q6H  . losartan  100 mg Oral Daily  . metoprolol tartrate  75 mg Oral BID  . pantoprazole  40 mg Oral Daily  . predniSONE  5 mg Oral Q breakfast  . rosuvastatin  10 mg Oral Daily    OBJECTIVE  Filed Vitals:   04/23/13 2352 04/24/13 0421 04/24/13 0623 04/24/13 0800  BP: 94/60 131/87  130/73  Pulse: 64 68  65  Temp: 97.6 F (36.4 C) 97.8 F (36.6 C)  98.5 F (36.9 C)  TempSrc: Oral Oral  Oral  Resp: 20 20  18   Height:      Weight:   163 lb 9.3 oz (74.2 kg)   SpO2: 92% 95%  98%    Intake/Output Summary (Last 24 hours) at 04/24/13 0853 Last data filed at 04/23/13 2000  Gross per 24 hour  Intake 1071.3 ml  Output      0 ml  Net 1071.3 ml   Filed Weights   04/19/13 2214 04/23/13 0511 04/24/13 0623  Weight: 166 lb 12.8 oz (75.66 kg) 165 lb 9.1 oz (75.1 kg) 163 lb 9.3 oz (74.2 kg)    PHYSICAL EXAM  General: Pleasant, NAD. Neuro: Alert and oriented X 3. Moves all extremities spontaneously. Psych: Normal  affect. HEENT:  Normal  Neck: Supple without bruits or JVD. Lungs:  Resp regular and unlabored, CTA. Heart: RRR no s3, s4, or murmurs. Abdomen: Soft, non-tender, non-distended, BS + x 4.  Extremities: No clubbing, cyanosis, 1-2+ bilat LE edema. DP/PT/Radials 2+ and equal bilaterally.  R radial cath site w/o bleeding/bruit/hematoma.  Accessory Clinical Findings  CBC  Recent Labs  04/23/13 0453 04/24/13 0435  WBC 8.1 8.4  HGB 11.3* 11.7*  HCT 35.4* 37.1*  MCV 99.2 99.7  PLT 229 237   Basic Metabolic Panel  Recent Labs  04/23/13 0453 04/24/13 0435  NA 145 145  K 4.1 3.8  CL 103 102  CO2 33* 33*  GLUCOSE 100* 127*  BUN 18 16  CREATININE 1.33 1.24  CALCIUM 8.8 9.0   Thyroid Function Tests  Recent Labs  04/23/13 0453  T3FREE 3.6   TELE  Sinus, freq pac's.  Brief episode of bradycardia with rates in the 30's at ~ 310 am.  ECG  Rsr, 61, r axis, rvh, no acute st/t changes.  Radiology/Studies  Dg Chest 2 View  04/19/2013  CLINICAL DATA:  Dyspnea and weakness with history of COPD, on waiting list for lung transplant.  EXAM: CHEST  2 VIEW  COMPARISON:  Portable chest x-ray March 29, 2013.  FINDINGS: The lungs are borderline hypoinflated. This appears stable. The left hemidiaphragm is higher than the right which is also stable. There are coarse increased lung markings bilaterally with areas of near confluence. These are stable. The cardiac silhouette is top-normal in size. The pulmonary vascularity is not engorged. There is no pleural effusion or pneumothorax. The mediastinum is normal in width. The observed portions of the bony thorax appear normal.  IMPRESSION: There has not been significant interval change since the study of 29 March 2013. Bilateral pleural and parenchymal scarring is suspected. There is no pneumothorax. An element of low grade CHF may be present.   Electronically Signed   By: David  Swazil[place]Jordan   On: 04/19/2013 14:13   ASSESSMENT AND PLAN  1.   USA/CAD: s/p cath and pci/des of RCA yesterday.  No chest pain overnight.  Meds reviewed with Dr. Excell Seltzerooper.  In light of h/o RV thrombus with need for ongoing coumadin anticoagulation, will d/c asa/heparin.  Provide 5mg  of coumadin now.  Cont effient.  He has been on and has tolerated the combination of coumadin/effient over the past year.  Cont asa/bb/statin.  2.  H/O RV thrombus:  Resume coumadin.  Plan to d/c w/o bridge.  F/U in coumadin clinic early next week.  3.  COPD:  Pending Tx eval @ [facility].  Will provide pt with copy of cath/pci to bring with him.  4.  RV Failure:  Has some LEE.  Cont home dose of PO lasix.  5.  PAT:  In sinus with freq pac's.  No prolonged tachycardia.  Cont bb/amio.  Signed, Nicolasa Duckinghristopher Berge NP  Patient seen, examined. Available data reviewed. Agree with findings, assessment, and plan as outlined by Ward Givenshris Berge, NP. The patient is stable post-PCI. Reviewed plans for anticoagulation carefully. The patient has been on effient and warfarin for the past year with good success. I think this is probably the best plan moving forward, so will restart warfarin today and continue Effient WITHOUT aspirin. OK for discharge home today. Exam reveals ecchymosis of the right forearm, clear right groin site.  Tonny BollmanMichael Taryn Nave, M.D. 04/24/2013 9:58 AM

## 2013-04-24 NOTE — Progress Notes (Signed)
Heparin per pharmacy  Anticoag: s/p cath. Was on warfarin PTA for afib; switched to heparin due to cath. Discussed with Dr. Eldridge Dace and he doesn't want to restart coumadin until tomorrow or at a later day but wants to keep him on heparin for now (to be restarted at 1700 per MD's order). INR today is 1.24 and heparin level was good at 0.46 and 0.55 this morning  6hr heparin level is 0.3-0.7  Hem/Onc: Hgb 11.7 and Plt 237 K stable.  Plan: 1) Continue heparin at 1150 units / hr.  2) Daily heparin level and CBC 3) f/u resume of coumadin

## 2013-04-25 ENCOUNTER — Encounter (HOSPITAL_COMMUNITY): Payer: Managed Care, Other (non HMO)

## 2013-04-28 ENCOUNTER — Encounter (HOSPITAL_COMMUNITY): Payer: Managed Care, Other (non HMO)

## 2013-04-28 ENCOUNTER — Ambulatory Visit (INDEPENDENT_AMBULATORY_CARE_PROVIDER_SITE_OTHER): Payer: Managed Care, Other (non HMO) | Admitting: *Deleted

## 2013-04-28 DIAGNOSIS — Z7901 Long term (current) use of anticoagulants: Secondary | ICD-10-CM

## 2013-04-28 DIAGNOSIS — I513 Intracardiac thrombosis, not elsewhere classified: Secondary | ICD-10-CM

## 2013-04-28 DIAGNOSIS — I219 Acute myocardial infarction, unspecified: Secondary | ICD-10-CM

## 2013-04-28 LAB — POCT INR: INR: 2.4

## 2013-04-30 ENCOUNTER — Telehealth: Payer: Self-pay | Admitting: Pulmonary Disease

## 2013-04-30 ENCOUNTER — Ambulatory Visit (INDEPENDENT_AMBULATORY_CARE_PROVIDER_SITE_OTHER): Payer: Managed Care, Other (non HMO) | Admitting: Pulmonary Disease

## 2013-04-30 ENCOUNTER — Encounter (HOSPITAL_COMMUNITY): Payer: Managed Care, Other (non HMO)

## 2013-04-30 ENCOUNTER — Encounter: Payer: Self-pay | Admitting: Pulmonary Disease

## 2013-04-30 VITALS — BP 138/82 | HR 70 | Temp 97.7°F

## 2013-04-30 DIAGNOSIS — J9610 Chronic respiratory failure, unspecified whether with hypoxia or hypercapnia: Secondary | ICD-10-CM

## 2013-04-30 DIAGNOSIS — R079 Chest pain, unspecified: Secondary | ICD-10-CM | POA: Insufficient documentation

## 2013-04-30 DIAGNOSIS — J449 Chronic obstructive pulmonary disease, unspecified: Secondary | ICD-10-CM

## 2013-04-30 MED ORDER — PREDNISONE 10 MG PO TABS: ORAL_TABLET | ORAL | Status: DC

## 2013-04-30 NOTE — Assessment & Plan Note (Signed)
The patient is describing atypical chest discomfort in the mornings upon arising with a feeling of gas and associated with belching. It should be noted that he is on a bilevel device for noninvasive positive pressure ventilation. I am wondering if he is having air gulping associated with this, and whether he is also having air trapping associated with his COPD which is leading to worsening shortness of breath in the mornings. His bilevel settings are not available, and it may be that he is not a good candidate for this device. It may also be that he needs a different type of device that takes into account his minute ventilation. I have asked him to discontinue his bilevel for one or 2 nights to see if his symptoms are better, and if so, he will need a change to his therapy. There is nothing by his history to suggest this may be angina, and he has had a recent cath with stent placement. His history is not consistent with thromboembolic disease either.

## 2013-04-30 NOTE — Patient Instructions (Addendum)
You need to stay on oxygen at 4 liters 24hrs/day. I would like you to stay off bipap one night (but on oxygen) to see if your am "chest tightness/gas" is better.  If you are better, then you will need adjustments to your bipap to prevent airtrapping.  Will increase your prednisone to 40mg  a day for 2 days, then 30mg  a day for 2 days, then 20mg  a day for 2 days, then stop at 10mg  a day. followup with Dr. Delton Coombes or our nurse practitioner in one week to see how things are going.   Call if you feel you are worsening.

## 2013-04-30 NOTE — Assessment & Plan Note (Signed)
The patient has known significant COPD, but he really does not have acute bronchospasm on exam today. Nevertheless, he does have increasing shortness of breath and I will empirically give him a prednisone pulse in the event this is an early acute exacerbation.  Again, I am wondering if his positive pressure device at night is contributing to his increased pulmonary symptoms.

## 2013-04-30 NOTE — Progress Notes (Signed)
   Subjective:    Patient ID: Danny [name], male    DOB: 1955/07/03, 58 y.o.   MRN: [id number]  HPI The patient comes in today for an acute sick visit. He has known gold C copd with chronic respiratory failure, and was recently in the hospital with coronary disease that required stenting.  He has been having issues first thing in the mornings upon awakening with "gas" in his chest, and increased shortness of breath. This morning it was especially bad, and he walked into the office to be seen. His saturations were noted to be very poor on low-flow oxygen, but responded very nicely to 4 L of oxygen. It should be noted that he was on 4 L at the last visit in December, and had saturations of 90%. The patient states that he can belch for a prolonged period of time in the mornings, and it should be noted that he is on a bilevel device for his chronic respiratory failure. He has no idea what pressure this is set on, or if it has a backup rate. He denies anginal-like discomfort, and no sweats or nausea. The discomfort is not pleuritic in nature, nor has he had worsening lower extremity edema. His weights have been stable on a daily basis since discharge from the hospital. He denies any chest congestion, and does not feel like he has a chest cold.   Review of Systems  Constitutional: Negative for fever and unexpected weight change.  HENT: Negative for congestion, dental problem, ear pain, nosebleeds, postnasal drip, rhinorrhea, sinus pressure, sneezing, sore throat and trouble swallowing.   Eyes: Negative for redness and itching.  Respiratory: Positive for chest tightness and shortness of breath. Negative for cough and wheezing.   Cardiovascular: Positive for chest pain. Negative for palpitations and leg swelling.  Gastrointestinal: Negative for nausea and vomiting.  Genitourinary: Negative for dysuria.  Musculoskeletal: Negative for joint swelling.  Skin: Negative for rash.  Neurological: Negative for  headaches.  Hematological: Does not bruise/bleed easily.  Psychiatric/Behavioral: Negative for dysphoric mood. The patient is not nervous/anxious.        Objective:   Physical Exam Well-developed male in no acute distress currently at rest on 4 L. Nose without purulence or discharge noted Neck without lymphadenopathy or thyromegaly Chest with decreased breath sounds, but fairly reasonable airflow noted. No significant crackles or wheezing heard. Cardiac exam with regular rate and rhythm Lower extremities with minimal ankle edema, no cyanosis Alert and oriented, moves all 4 extremities.       Assessment & Plan:

## 2013-04-30 NOTE — Telephone Encounter (Signed)
Rx has been sent in. Pt is aware. 

## 2013-04-30 NOTE — Assessment & Plan Note (Signed)
The patient will need to be on oxygen at 4 L, and he has excellent sats with no increased work of breathing this morning in the office on this setting. He has also been on this liter flow in the past with adequate results.

## 2013-05-02 ENCOUNTER — Encounter (HOSPITAL_COMMUNITY): Payer: Managed Care, Other (non HMO)

## 2013-05-05 ENCOUNTER — Encounter (HOSPITAL_COMMUNITY): Payer: Managed Care, Other (non HMO)

## 2013-05-07 ENCOUNTER — Ambulatory Visit: Payer: Managed Care, Other (non HMO) | Admitting: Adult Health

## 2013-05-07 ENCOUNTER — Telehealth: Payer: Self-pay | Admitting: Emergency Medicine

## 2013-05-07 ENCOUNTER — Encounter (HOSPITAL_COMMUNITY): Payer: Managed Care, Other (non HMO)

## 2013-05-07 NOTE — Telephone Encounter (Signed)
Made in error

## 2013-05-08 ENCOUNTER — Encounter: Payer: Self-pay | Admitting: Cardiology

## 2013-05-08 ENCOUNTER — Ambulatory Visit (INDEPENDENT_AMBULATORY_CARE_PROVIDER_SITE_OTHER): Payer: Managed Care, Other (non HMO) | Admitting: *Deleted

## 2013-05-08 ENCOUNTER — Ambulatory Visit (INDEPENDENT_AMBULATORY_CARE_PROVIDER_SITE_OTHER): Payer: Managed Care, Other (non HMO) | Admitting: Cardiology

## 2013-05-08 VITALS — BP 135/86 | HR 98 | Ht 67.0 in | Wt 164.0 lb

## 2013-05-08 DIAGNOSIS — I219 Acute myocardial infarction, unspecified: Secondary | ICD-10-CM

## 2013-05-08 DIAGNOSIS — I513 Intracardiac thrombosis, not elsewhere classified: Secondary | ICD-10-CM

## 2013-05-08 DIAGNOSIS — J449 Chronic obstructive pulmonary disease, unspecified: Secondary | ICD-10-CM

## 2013-05-08 DIAGNOSIS — Z7901 Long term (current) use of anticoagulants: Secondary | ICD-10-CM

## 2013-05-08 DIAGNOSIS — I4891 Unspecified atrial fibrillation: Secondary | ICD-10-CM

## 2013-05-08 DIAGNOSIS — I2510 Atherosclerotic heart disease of native coronary artery without angina pectoris: Secondary | ICD-10-CM

## 2013-05-08 LAB — POCT INR: INR: 4.6

## 2013-05-08 NOTE — Assessment & Plan Note (Signed)
Continue on Coumadin °

## 2013-05-08 NOTE — Assessment & Plan Note (Signed)
Also paroxysmal atrial tachycardia versus MAT. Better on amiodarone which will be continued at 200 mg daily for now.

## 2013-05-08 NOTE — Patient Instructions (Addendum)
Your physician recommends that you schedule a follow-up appointment in: 6-8 weeks   Please get blood work 1 week before seeing Dr.McDowell back   Your physician recommends that you continue on your current medications as directed. Please refer to the Current Medication list given to you today.   Thanks for choosing [facility] !

## 2013-05-08 NOTE — Progress Notes (Signed)
Clinical Summary Danny [name] is a medically complex 58 y.o.male presenting for a post hospital followup. Since his last visit, history includes presentation with recurrent atrial tachycardia/fibrillation associated with unstable angina symptoms. He was admitted to [facility], ruled out for myocardial infarction. He underwent diagnostic cardiac catheterization that demonstrated an 80% RCA stenosis that was treated with DES by Dr. Eldridge Dace. Fortunately remaining vessels were widely patent including stent site within the circumflex system. Right heart pressures showed severe pulmonary hypertension with PA pressure 73/52, mean RA pressure 17.  Do to his severe associated COPD, pulmonary was consulted during the patient's stay. Atrial arrhythmias were controlled with amiodarone, used cautiously in the setting of his chronic lung disease. Coreg was switched to metoprolol, he was treated with Effient and Coumadin following DES intervention, no aspirin given anticipated long-term anticoagulation.  Followup echocardiogram revealed LVEF 50-55%, grade 1 diastolic dysfunction with increased filling pressures, severe right ventricular dilatation with severely reduced function, moderate to severe right atrial enlargement. Last BMET in January showed potassium 3.8, BUN 16, creatinine 1.2.  He tells me that he has had a followup visit at [facility] for lung transplant evaluation. He had PFTs, 6 minute walk test, ECG. Tracing was reviewed. Further discussion is underway.  He reports no bleeding problems on current regimen. Denies any chest pain, palpitations are definitely better.   Allergies  Allergen Reactions  . Lipitor [Atorvastatin] Other (See Comments)    Muscle spasms     Current Outpatient Prescriptions  Medication Sig Dispense Refill  . acetaminophen (TYLENOL) 500 MG tablet Take 1,000 mg by mouth every 6 (six) hours as needed for moderate pain.       Marland Kitchen albuterol (PROVENTIL HFA;VENTOLIN HFA) 108  (90 BASE) MCG/ACT inhaler Inhale 2 puffs into the lungs every 6 (six) hours as needed for wheezing or shortness of breath.       Marland Kitchen amiodarone (PACERONE) 200 MG tablet Take 1 tablet (200 mg total) by mouth daily.  30 tablet  6  . antiseptic oral rinse (BIOTENE) LIQD 15 mLs by Mouth Rinse route 4 (four) times daily as needed for dry mouth.      Marland Kitchen arformoterol (BROVANA) 15 MCG/2ML NEBU Take 15 mcg by nebulization 2 (two) times daily.      . budesonide (PULMICORT) 0.25 MG/2ML nebulizer solution Take 0.25 mg by nebulization 2 (two) times daily.      . carvedilol (COREG) 25 MG tablet Take 25 mg by mouth 2 (two) times daily with a meal.       . cloNIDine (CATAPRES) 0.1 MG tablet Take 0.1 mg by mouth 3 (three) times daily.      . clotrimazole (MYCELEX) 10 MG troche Take 10 mg by mouth daily.       . digoxin (LANOXIN) 0.125 MG tablet Take 0.125 mg by mouth every morning.      . furosemide (LASIX) 40 MG tablet Take 40-80 mg by mouth daily. *takes an extra 40mg  tablet if needed for swelling      . levalbuterol (XOPENEX) 1.25 MG/0.5ML nebulizer solution Take 1.25 mg by nebulization every 6 (six) hours.  1 each  12  . losartan (COZAAR) 100 MG tablet Take 100 mg by mouth every morning.       . metoprolol (LOPRESSOR) 50 MG tablet Take 1.5 tablets (75 mg total) by mouth 2 (two) times daily.  90 tablet  6  . nitroGLYCERIN (NITROSTAT) 0.4 MG SL tablet Place 1 tablet (0.4 mg total) under the tongue every  5 (five) minutes x 3 doses as needed for chest pain.  25 tablet  3  . omeprazole (PRILOSEC) 20 MG capsule Take 20 mg by mouth 2 (two) times daily.       . potassium chloride (K-DUR) 10 MEQ tablet Take 10 mEq by mouth every other day.      . prasugrel (EFFIENT) 10 MG TABS tablet Take 1 tablet (10 mg total) by mouth daily.  30 tablet  6  . predniSONE (DELTASONE) 10 MG tablet 40mg  a day for 2 days, then 30mg  a day for 2 days, then 20mg  a day for 2 days, then stop at 10mg  a day.  20 tablet  0  . predniSONE (DELTASONE) 5  MG tablet Take 1 tablet (5 mg total) by mouth daily with breakfast.  90 tablet  1  . ramipril (ALTACE) 2.5 MG capsule       . rosuvastatin (CRESTOR) 10 MG tablet Take 10 mg by mouth every evening.      . sodium chloride (OCEAN) 0.65 % nasal spray Place 1 spray into the nose every 6 (six) hours as needed for congestion.      Marland Kitchen warfarin (COUMADIN) 2.5 MG tablet Take 2.5-3.75 mg by mouth daily. Take 2.5 mg tablet alternating with 3.75 mg  tablets every other day       No current facility-administered medications for this visit.    Past Medical History  Diagnosis Date  . GERD (gastroesophageal reflux disease)   . COPD (chronic obstructive pulmonary disease)   . Chronic combined systolic and diastolic CHF (congestive heart failure)   . Essential hypertension, benign   . Cardiomyopathy     a. previous LVEF 40-45%;  b. 03/2013 Echo: EF 50-55%, Gr 1 DD, sev dil RV/RA with sev RV dysfxn, PASP . No evidence of RV thrombus.  . Right ventricular mural thrombus     On Coumadin  . Cor pulmonale     a. Severe RV dysfunction;  b. 04/2013 R heart cath: RA 17, RV 70/18 (22), PA 73/52.  . Coronary atherosclerosis of native coronary artery     a. 05/2012 MI/PCI: DES to OM [facility];  b. 04/2013 Cath/PCI: LM nl, LAD nonobs, LCX patent mid stent, RCA dom, 70m (2.5x16 Promus DES).  Marland Kitchen PAF (paroxysmal atrial fibrillation)   . Spontaneous pneumothorax 2002    Bilateral  . PAT (paroxysmal atrial tachycardia)     a. on amio/bb.    Social History Danny [name] reports that he quit smoking about 19 years ago. His smoking use included Cigarettes. He has a 50 pack-year smoking history. He has never used smokeless tobacco. Danny [name] reports that he does not drink alcohol.  Review of Systems No fevers or chills, no cough, no bleeding episodes. As outlined above.  Physical Examination Filed Vitals:   05/08/13 0947  BP: 135/86  Pulse: 98   Filed Weights   05/08/13 0947  Weight: 164 lb (74.39  kg)   Wearing oxygen via [place]. Appears comfortable at rest.  HEENT: Conjunctiva and lids normal, oropharynx clear.  Neck: Supple, no elevated JVP or carotid bruits, no thyromegaly.  Lungs: Decreased breath sounds, no wheezing, nonlabored breathing at rest.  Cardiac: Regular rate and rhythm, indistinct PMI, no S3 or significant systolic murmur, no pericardial rub.  Abdomen: Soft, nontender, bowel sounds present.  Extremities: 1+ edema, distal pulses 2+.  Skin: Warm and dry.  Musculoskeletal: No kyphosis.  Neuropsychiatric: Alert and oriented x3, affect grossly appropriate.   Problem  List and Plan   Coronary atherosclerosis of native coronary artery Symptomatically stable, recently status post DES to the RCA, patent circumflex distribution stent and left coronary system. LVEF 50-55%. Continue on Effient, also on Coumadin with history of RV mural thrombus and severe RV dysfunction due to COPD. No aspirin. He will need a followup CBC for next visit.  Atrial fibrillation Also paroxysmal atrial tachycardia versus MAT. Better on amiodarone which will be continued at 200 mg daily for now.  COPD (chronic obstructive pulmonary disease) Associated with severe pulmonary hypertension and severe RV dysfunction. [facility]. Keep follow with Dr. Juanetta GoslingHawkins.  RV (right ventricular) mural thrombus Continue on Coumadin.    Jonelle SidleSamuel G. McDowell, M.D., F.A.C.C.

## 2013-05-08 NOTE — Assessment & Plan Note (Signed)
Symptomatically stable, recently status post DES to the RCA, patent circumflex distribution stent and left coronary system. LVEF 50-55%. Continue on Effient, also on Coumadin with history of RV mural thrombus and severe RV dysfunction due to COPD. No aspirin. He will need a followup CBC for next visit.

## 2013-05-08 NOTE — Assessment & Plan Note (Signed)
Associated with severe pulmonary hypertension and severe RV dysfunction. Currently undergoing lung transplant evaluation at [facility]. Keep follow with Dr. Juanetta Gosling.

## 2013-05-09 ENCOUNTER — Ambulatory Visit (INDEPENDENT_AMBULATORY_CARE_PROVIDER_SITE_OTHER): Payer: Managed Care, Other (non HMO) | Admitting: Adult Health

## 2013-05-09 ENCOUNTER — Encounter: Payer: Self-pay | Admitting: Adult Health

## 2013-05-09 ENCOUNTER — Encounter (HOSPITAL_COMMUNITY): Payer: Managed Care, Other (non HMO)

## 2013-05-09 VITALS — BP 112/72 | HR 64 | Temp 96.9°F | Ht 67.0 in | Wt 164.6 lb

## 2013-05-09 DIAGNOSIS — J449 Chronic obstructive pulmonary disease, unspecified: Secondary | ICD-10-CM

## 2013-05-09 NOTE — Addendum Note (Signed)
Addended by: Thompson Grayer on: 05/09/2013 01:52 PM   Modules accepted: Orders

## 2013-05-09 NOTE — Progress Notes (Signed)
Subjective:    Patient ID: Danny [name], male    DOB: 12/10/1955, 58 y.o.   MRN: [id number] HPI 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    01/15/2008--Complains of 2 weeks of cough, congestion, blood tinged initially. Went to ER CXR and CT chest showed 4 R broken ribs, negative for PE. bilateral scarring, and questionable opacities. Started on Avelox for 4 days.(per pt ). Some better but still has coughing fits with thick mucus. and DOE, no energy. Pain in ribs with coughng. finished avelox 2 days. ago.   02/04/08 - returns today for f/u, has completed avelox. CP is better. Breathing is better. Cough and hemoptysis have resolved. Seldom uses ProAir.   ROV 06/01/09 -- Returns for f/u, last seen 2009. His breathing has been stable. Tells me he ran out of Advair about 2 weeks ago, noticed that his SABA use increased some. Has had some anxiety at night about not getting his meds but breathing ok. No exacerbations since last visit. No smoking. No cough or hemoptysis.   ROV 09/05/10 -- Hx COPD, emphysema and s/p bullectomies. Follows up for annual check. No flares since last visit. Uses Advair bid, ProAir prn. He believes he would miss the Advair if stopped. Uses ProAir once every several days. Occas wheze, no real cough. Occas gray sputum. No CP. Needs his meds refilled.   ROV 02/07/11 -- COPD, bullous emphysema. Presents today c/o more congestion, thick sputum, over about 5 months. He has been more active, and wonders if this is a cause. The mucous if thick yellow. Still able to work. Denies much dyspnea. No exacerbations. Uses his SABA a couple times a week.   ROV 10/23/11 -- COPD, bullous emphysema. He tried mucinex since last time. He has had some LE/toe neuropathic changes, ? Some rash on fingers. He stopped the mucinex and the sx are better. Remains on Advair. Uses albuterol rarely.  Coughs but not every day. Some DOE,  happens almost every day.   ROV 04/24/12 -- COPD, bullous emphysema s/p bullectomies. Returns for f/u.  He had a recent URI that caused probable AE, was rx with abx, also started on a trial of Spiriva x 1 week. He was already on Advair. The URI sx are better. Uses SABA typically 1-2 x a day. He is trying to get back to exercising.   ROV 05/10/12 -- COPD, bullous emphysema s/p bullectomies. Was recently evaluated w cardiac cath at [facility], had NSTEMI and cath >> R dominant system, stent placed in the circumflex. LVEDP was normal, RV was dilated. He is improved, was discharged on O2. His Spiriva was stopped and he was changed to DuoNebs during that hospitalization. His Advair was continued.  Feels drained but stable  ROV 06/25/12 -- COPD, bullous emphysema s/p bullectomies. CAD w hx NSTEMI. Doing cardiopulm rehab.  He has seen Dr Eden Emms to establish in [place]. He remains on Advair + Duonebs. He didn't tolerate spiriva. 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 He is compliant with his O2.   ROV 09/26/12 -- COPD, bullous emphysema s/p bullectomies. CAD w hx NSTEMI, secondary PAH. He reports today that he has had more dyspnea, seems to be associated with some epigastric, lower chest fullness or pain. He reports desats with O2 at 3L/min when exercising. He is on prednisone 5, started by Dr Janna Arch,  that he is using prn. He feels that it is helping with his pain.   ROV 02/04/13 -- COPD, bullous emphysema s/p bullectomies. CAD w hx NSTEMI, secondary PAH. He has a CM + RV dysfunction, mural thrombus on coumadin. On presentation today he is hypoxic. He describes increase SOB and cough over the last few months. He denies any acute exacerbations he remains on Advair, duonebs 2-3x a day. He has prednisone that is available to use prn for pain - not currently on any. He wears BiPAP every night. He had a CT scan in 10/23/12 for dyspnea and CP.   02/25/13  Post Hospital follow up  Pt returns for a post hospital follow up .  Reports is doing "much better". Still has some cough and congestion.  Admitted 02/13/2013 for COPD exacerbation. He was started on steroids and breathing treatments and antibiotics.  At discharge , Home O2 increased to 2l/m with rest 4l/m with activity.  Advair was changed to Brovana/pulmicort. Remains on Coreg 25mg  Twice daily   Remains on ACE and ARB . Pt denies chest pain, orthopnea, edema or fever.  Discharged on prednisone taper-has few days left. Wears BIPAP everynight.   ROV 03/21/13 -- COPD, bullous emphysema s/p bullectomies. CAD w hx NSTEMI, secondary PAH. He has been on chronic prednisone 5mg . He is on budesonide + brovana. He was restarted on prednisone, now on 5mg . He has done poorly when it has been tapered to 0.   Follow up OV 05/09/13  Returns for follow up for COPD .  Was seen last week for COPD flare , tx w/ steroid burst.  Now back to 10mg  daily.  Says he is feeling better, decreased dyspnea and cough .  No chest pain, orthopnea, increased edema or fever.  Good appetite , no n/v/d. Feels much better.  Remains on brovana and pulmicort Twice daily   Has been intolerant to spiriva in past.  Has been going to [facility] for lung transplant evaluation      ROS Constitutional:   No  weight loss, night sweats,  Fevers, chills,  +fatigue, or  lassitude.  HEENT:   No headaches,  Difficulty swallowing,  Tooth/dental problems, or  Sore throat,                No sneezing, itching, ear ache, nasal congestion, post nasal drip,   CV:  No chest pain,  Orthopnea, PND, swelling in lower extremities, anasarca, dizziness, palpitations, syncope.   GI  No heartburn, indigestion, abdominal pain, nausea, vomiting, diarrhea, change in bowel habits, loss of appetite, bloody stools.   Resp:   No chest wall deformity  Skin: no rash or lesions.  GU: no dysuria, change in color of urine, no urgency or frequency.  No flank  pain, no hematuria   MS:  No joint pain or swelling.  No decreased range of motion.  No back pain.  Psych:  No change in mood or affect. No depression or anxiety.  No memory loss.        Objective:   Physical Exam Gen: Pleasant, in no distress,  normal affect  ENT: no postnasal drip, clear post pharynx   Neck: No JVD, no TMG, no carotid bruits  Lungs: No use of accessory muscles, clear without rales or rhonchi  Cardiovascular: RRR, heart sounds normal, no murmur or gallops, trace edema  Musculoskeletal: No deformities, no cyanosis or clubbing  Neuro: alert, non focal  Skin: Warm, no lesions or rashes   10/23/12 --  Comparison: Chest radiograph, 10/23/2012. Chest CT, 08/04/2008.  Findings: There is no evidence of a pulmonary embolism.  The heart is enlarged, but stable. There is a short left coronary  artery stents. The no mediastinal or hilar masses are appreciated.  Fascia on the right subcarinal lymph node is mildly enlarged  measuring 14 mm in short axis. There are prominent hilar lymph  nodes bilaterally are not well defined.  There are changes of emphysema and lung scarring. There is a new 8  mm irregular focal opacity in the left upper lobe that may reflect  additional scarring. Neoplastic disease is possible. There is  some ground-glass type opacity in the posterior lateral right lower  lobe which was not present previously. This is somewhat  nonspecific. It could reflect an alveolitis. No other evidence of  infection/inflammation. No other change from the prior CT within  the lungs.  There are multiple old rib fractures on the right.  Limited evaluation of the upper abdomen is unremarkable.  There are minor degenerative changes of the thoracic spine. No  osteoblastic or osteolytic lesions.  IMPRESSION:  No evidence of a pulmonary embolism.  Significant emphysema and areas of lung scarring.  Mild ground-glass opacity in the right lower lobe, new from the   prior study. Consider an acute inflammatory or infectious  alveolitis in the proper clinical setting.  8 mm focal opacity in the left upper lobe is new from prior study.     Assessment & Plan:

## 2013-05-09 NOTE — Patient Instructions (Signed)
Continue on current regimen  Stay on prednisone 10mg  daily until seen back in office in 2 weeks  Follow up Dr. Delton Coombes  As planned and As needed

## 2013-05-09 NOTE — Assessment & Plan Note (Addendum)
Recent exacerbation now resolved  Will hold at 10mg  daily for now  Return in 2 weeks with Dr. Delton Coombes  As planned    Plan  Continue on current regimen  Stay on prednisone 10mg  daily until seen back in office in 2 weeks  Follow up Dr. Delton Coombes  As planned and As needed

## 2013-05-12 ENCOUNTER — Encounter (HOSPITAL_COMMUNITY): Payer: Managed Care, Other (non HMO)

## 2013-05-14 ENCOUNTER — Telehealth: Payer: Self-pay | Admitting: *Deleted

## 2013-05-14 ENCOUNTER — Encounter (HOSPITAL_COMMUNITY)
Admission: RE | Admit: 2013-05-14 | Discharge: 2013-05-14 | Disposition: A | Discharge status: Home / Self Care | Payer: Managed Care, Other (non HMO) | Source: Ambulatory Visit | Attending: Cardiology | Admitting: Cardiology

## 2013-05-14 DIAGNOSIS — I509 Heart failure, unspecified: Secondary | ICD-10-CM | POA: Insufficient documentation

## 2013-05-14 DIAGNOSIS — I471 Supraventricular tachycardia, unspecified: Secondary | ICD-10-CM | POA: Insufficient documentation

## 2013-05-14 DIAGNOSIS — I5032 Chronic diastolic (congestive) heart failure: Secondary | ICD-10-CM | POA: Insufficient documentation

## 2013-05-14 DIAGNOSIS — Z5189 Encounter for other specified aftercare: Secondary | ICD-10-CM | POA: Insufficient documentation

## 2013-05-14 DIAGNOSIS — J449 Chronic obstructive pulmonary disease, unspecified: Secondary | ICD-10-CM | POA: Insufficient documentation

## 2013-05-14 DIAGNOSIS — J4489 Other specified chronic obstructive pulmonary disease: Secondary | ICD-10-CM | POA: Insufficient documentation

## 2013-05-14 NOTE — Telephone Encounter (Signed)
Pt wife called to advise that [facility] called to say he has kidney disease and his hemoglobin is 8.5 and he needs an endocrinologist and kidney level was 1.9 and [facility] did not offer to set this up, pt wife unable to get referral from [facility] per several attempts to contact and no response, pt wife advised she does not need a referral per insurance only a name, gave pt wife contact information for [facility], pt wife will contact and requested that we inform Dr. Diona Browner and Lorin Picket NP of this update, [facility] has [facility] care per noted nothing further can be done at this point and also noted if surgery were possible based on pt cardiac issues he would not make it through surgery, again this nurse offered condolences for pt diagnosis, pt and wife will call us back with any concerns if noted

## 2013-05-14 NOTE — Telephone Encounter (Signed)
Noted. I hope that the [facility] transplant team also communicated with Dr. Juanetta Gosling and Dr. Delton Coombes, as they are actually the ones coordinating his primary and pulmonary care. We will continue to focus on his cardiac issues. Coordination of referrals and other management should be handled through his primary care provider.

## 2013-05-15 ENCOUNTER — Inpatient Hospital Stay (HOSPITAL_COMMUNITY)
Admission: EM | Admit: 2013-05-15 | Discharge: 2013-05-18 | DRG: 292 | Disposition: A | Discharge status: Home / Self Care | Payer: Managed Care, Other (non HMO) | Attending: Internal Medicine | Admitting: Internal Medicine

## 2013-05-15 ENCOUNTER — Encounter (HOSPITAL_COMMUNITY): Payer: Self-pay | Admitting: Emergency Medicine

## 2013-05-15 ENCOUNTER — Emergency Department (HOSPITAL_COMMUNITY): Payer: Managed Care, Other (non HMO)

## 2013-05-15 DIAGNOSIS — E119 Type 2 diabetes mellitus without complications: Secondary | ICD-10-CM | POA: Diagnosis present

## 2013-05-15 DIAGNOSIS — Z9981 Dependence on supplemental oxygen: Secondary | ICD-10-CM

## 2013-05-15 DIAGNOSIS — J9610 Chronic respiratory failure, unspecified whether with hypoxia or hypercapnia: Secondary | ICD-10-CM | POA: Diagnosis present

## 2013-05-15 DIAGNOSIS — I5033 Acute on chronic diastolic (congestive) heart failure: Secondary | ICD-10-CM

## 2013-05-15 DIAGNOSIS — I5043 Acute on chronic combined systolic (congestive) and diastolic (congestive) heart failure: Principal | ICD-10-CM | POA: Diagnosis present

## 2013-05-15 DIAGNOSIS — Z888 Allergy status to other drugs, medicaments and biological substances status: Secondary | ICD-10-CM

## 2013-05-15 DIAGNOSIS — I10 Essential (primary) hypertension: Secondary | ICD-10-CM | POA: Diagnosis present

## 2013-05-15 DIAGNOSIS — E785 Hyperlipidemia, unspecified: Secondary | ICD-10-CM | POA: Diagnosis present

## 2013-05-15 DIAGNOSIS — I513 Intracardiac thrombosis, not elsewhere classified: Secondary | ICD-10-CM | POA: Diagnosis present

## 2013-05-15 DIAGNOSIS — I428 Other cardiomyopathies: Secondary | ICD-10-CM | POA: Diagnosis present

## 2013-05-15 DIAGNOSIS — I2510 Atherosclerotic heart disease of native coronary artery without angina pectoris: Secondary | ICD-10-CM | POA: Diagnosis present

## 2013-05-15 DIAGNOSIS — R079 Chest pain, unspecified: Secondary | ICD-10-CM

## 2013-05-15 DIAGNOSIS — Z9861 Coronary angioplasty status: Secondary | ICD-10-CM

## 2013-05-15 DIAGNOSIS — Z87891 Personal history of nicotine dependence: Secondary | ICD-10-CM

## 2013-05-15 DIAGNOSIS — IMO0002 Reserved for concepts with insufficient information to code with codable children: Secondary | ICD-10-CM

## 2013-05-15 DIAGNOSIS — I4891 Unspecified atrial fibrillation: Secondary | ICD-10-CM | POA: Diagnosis present

## 2013-05-15 DIAGNOSIS — I509 Heart failure, unspecified: Secondary | ICD-10-CM | POA: Diagnosis present

## 2013-05-15 DIAGNOSIS — J449 Chronic obstructive pulmonary disease, unspecified: Secondary | ICD-10-CM | POA: Diagnosis present

## 2013-05-15 DIAGNOSIS — K219 Gastro-esophageal reflux disease without esophagitis: Secondary | ICD-10-CM | POA: Diagnosis present

## 2013-05-15 DIAGNOSIS — I2789 Other specified pulmonary heart diseases: Secondary | ICD-10-CM | POA: Diagnosis present

## 2013-05-15 DIAGNOSIS — Z7901 Long term (current) use of anticoagulants: Secondary | ICD-10-CM

## 2013-05-15 DIAGNOSIS — I471 Supraventricular tachycardia: Secondary | ICD-10-CM

## 2013-05-15 DIAGNOSIS — I272 Pulmonary hypertension, unspecified: Secondary | ICD-10-CM | POA: Diagnosis present

## 2013-05-15 DIAGNOSIS — J439 Emphysema, unspecified: Secondary | ICD-10-CM | POA: Diagnosis present

## 2013-05-15 HISTORY — DX: Type 2 diabetes mellitus without complications: E11.9

## 2013-05-15 NOTE — Telephone Encounter (Signed)
Agree with Dr. Diona Browner. Should be followed and addressed by PCP.

## 2013-05-15 NOTE — ED Notes (Signed)
Pt arrived via EMS. Pt complaining of Left chest pain more rib related. Pt has diminished sounds on the left side (hx of COPD). Pt states pain is 3/10, but more painful with inspiration. Pt had cardiac cath on Jan 21st. 12 lead unremarkable per EMS. Pt is normally on 4L [place] at home. Fire dept placed patient on 15 L because patient o2 sat was in 70s. EMS placed patient on 5-6 L and patient's sats arei n in 90s. 129/60 NSR 70 bpm, RR-18

## 2013-05-15 NOTE — ED Notes (Signed)
Per report from previous shift nurse- patient was just transported to x-ray.

## 2013-05-16 ENCOUNTER — Encounter (HOSPITAL_COMMUNITY): Payer: Self-pay | Admitting: Internal Medicine

## 2013-05-16 ENCOUNTER — Encounter (HOSPITAL_COMMUNITY): Payer: Managed Care, Other (non HMO)

## 2013-05-16 DIAGNOSIS — I10 Essential (primary) hypertension: Secondary | ICD-10-CM

## 2013-05-16 DIAGNOSIS — J449 Chronic obstructive pulmonary disease, unspecified: Secondary | ICD-10-CM

## 2013-05-16 DIAGNOSIS — R079 Chest pain, unspecified: Secondary | ICD-10-CM | POA: Insufficient documentation

## 2013-05-16 DIAGNOSIS — I509 Heart failure, unspecified: Secondary | ICD-10-CM | POA: Diagnosis present

## 2013-05-16 DIAGNOSIS — I5033 Acute on chronic diastolic (congestive) heart failure: Secondary | ICD-10-CM

## 2013-05-16 DIAGNOSIS — K219 Gastro-esophageal reflux disease without esophagitis: Secondary | ICD-10-CM

## 2013-05-16 DIAGNOSIS — J9610 Chronic respiratory failure, unspecified whether with hypoxia or hypercapnia: Secondary | ICD-10-CM

## 2013-05-16 LAB — BASIC METABOLIC PANEL
BUN: 24 mg/dL — AB (ref 6–23)
CALCIUM: 9.8 mg/dL (ref 8.4–10.5)
CO2: 29 mEq/L (ref 19–32)
CREATININE: 1.27 mg/dL (ref 0.50–1.35)
Chloride: 95 mEq/L — ABNORMAL LOW (ref 96–112)
GFR, EST AFRICAN AMERICAN: 71 mL/min — AB (ref >90)
GFR, EST NON AFRICAN AMERICAN: 62 mL/min — AB (ref >90)
Glucose, Bld: 99 mg/dL (ref 70–99)
Potassium: 4.1 mEq/L (ref 3.7–5.3)
Sodium: 142 mEq/L (ref 137–147)

## 2013-05-16 LAB — CBC WITH DIFFERENTIAL/PLATELET
Basophils Absolute: 0.1 10*3/uL (ref 0.0–0.1)
Basophils Relative: 0 % (ref 0–1)
Eosinophils Absolute: 0.2 10*3/uL (ref 0.0–0.7)
Eosinophils Relative: 1 % (ref 0–5)
HEMATOCRIT: 40.7 % (ref 39.0–52.0)
Hemoglobin: 12.6 g/dL — ABNORMAL LOW (ref 13.0–17.0)
Lymphocytes Relative: 12 % (ref 12–46)
Lymphs Abs: 1.9 10*3/uL (ref 0.7–4.0)
MCH: 29.9 pg (ref 26.0–34.0)
MCHC: 31 g/dL (ref 30.0–36.0)
MCV: 96.4 fL (ref 78.0–100.0)
MONO ABS: 1.5 10*3/uL — AB (ref 0.1–1.0)
MONOS PCT: 9 % (ref 3–12)
NEUTROS ABS: 12 10*3/uL — AB (ref 1.7–7.7)
Neutrophils Relative %: 77 % (ref 43–77)
Platelets: 298 10*3/uL (ref 150–400)
RBC: 4.22 MIL/uL (ref 4.22–5.81)
RDW: 15.2 % (ref 11.5–15.5)
WBC: 15.6 10*3/uL — AB (ref 4.0–10.5)

## 2013-05-16 LAB — PROTIME-INR
INR: 2.78 — AB (ref 0.00–1.49)
INR: 2.14 — AB (ref 0.00–1.49)
PROTHROMBIN TIME: 28.4 s — AB (ref 11.6–15.2)
Prothrombin Time: 23.2 seconds — ABNORMAL HIGH (ref 11.6–15.2)

## 2013-05-16 LAB — TROPONIN I
Troponin I: <0.3 ng/mL (ref <0.30)
Troponin I: <0.3 ng/mL (ref <0.30)
Troponin I: <0.3 ng/mL (ref <0.30)

## 2013-05-16 LAB — COMPREHENSIVE METABOLIC PANEL
ALT: 15 U/L (ref 0–53)
AST: 16 U/L (ref 0–37)
Albumin: 3.4 g/dL — ABNORMAL LOW (ref 3.5–5.2)
Alkaline Phosphatase: 61 U/L (ref 39–117)
BUN: 25 mg/dL — ABNORMAL HIGH (ref 6–23)
CALCIUM: 9.3 mg/dL (ref 8.4–10.5)
CO2: 32 mEq/L (ref 19–32)
Chloride: 95 mEq/L — ABNORMAL LOW (ref 96–112)
Creatinine, Ser: 1.31 mg/dL (ref 0.50–1.35)
GFR calc Af Amer: 69 mL/min — ABNORMAL LOW (ref >90)
GFR calc non Af Amer: 59 mL/min — ABNORMAL LOW (ref >90)
Glucose, Bld: 153 mg/dL — ABNORMAL HIGH (ref 70–99)
Potassium: 3.9 mEq/L (ref 3.7–5.3)
SODIUM: 142 meq/L (ref 137–147)
TOTAL PROTEIN: 7.3 g/dL (ref 6.0–8.3)
Total Bilirubin: 1.1 mg/dL (ref 0.3–1.2)

## 2013-05-16 LAB — CBC
HCT: 40.8 % (ref 39.0–52.0)
HEMOGLOBIN: 13.1 g/dL (ref 13.0–17.0)
MCH: 30.8 pg (ref 26.0–34.0)
MCHC: 32.1 g/dL (ref 30.0–36.0)
MCV: 95.8 fL (ref 78.0–100.0)
PLATELETS: 280 10*3/uL (ref 150–400)
RBC: 4.26 MIL/uL (ref 4.22–5.81)
RDW: 15.1 % (ref 11.5–15.5)
WBC: 14.9 10*3/uL — ABNORMAL HIGH (ref 4.0–10.5)

## 2013-05-16 LAB — GLUCOSE, CAPILLARY
GLUCOSE-CAPILLARY: 125 mg/dL — AB (ref 70–99)
GLUCOSE-CAPILLARY: 115 mg/dL — AB (ref 70–99)
GLUCOSE-CAPILLARY: 239 mg/dL — AB (ref 70–99)
GLUCOSE-CAPILLARY: 209 mg/dL — AB (ref 70–99)

## 2013-05-16 LAB — POCT I-STAT TROPONIN I: Troponin i, poc: 0.06 ng/mL (ref 0.00–0.08)

## 2013-05-16 LAB — TSH: TSH: 0.913 u[IU]/mL (ref 0.350–4.500)

## 2013-05-16 LAB — PRO B NATRIURETIC PEPTIDE: Pro B Natriuretic peptide (BNP): 9941 pg/mL — ABNORMAL HIGH (ref 0–125)

## 2013-05-16 LAB — DIGOXIN LEVEL: DIGOXIN LVL: 1.2 ng/mL (ref 0.8–2.0)

## 2013-05-16 MED ORDER — PREDNISONE 10 MG PO TABS: 10.0000 mg | ORAL_TABLET | ORAL | Status: DC

## 2013-05-16 MED ORDER — WARFARIN SODIUM 2.5 MG PO TABS
2.5000 mg | ORAL_TABLET | Freq: Every day | ORAL | Status: DC
  Administered 2013-05-16: 2.5 mg via ORAL
  Filled 2013-05-16 (×2): qty 1

## 2013-05-16 MED ORDER — REGADENOSON 0.4 MG/5ML IV SOLN
0.4000 mg | Freq: Once | INTRAVENOUS | Status: DC
  Filled 2013-05-16: qty 5

## 2013-05-16 MED ORDER — POTASSIUM CHLORIDE ER 10 MEQ PO TBCR
10.0000 meq | EXTENDED_RELEASE_TABLET | ORAL | Status: DC
  Administered 2013-05-16 – 2013-05-18 (×2): 10 meq via ORAL
  Filled 2013-05-16 (×2): qty 1

## 2013-05-16 MED ORDER — CLONIDINE HCL 0.1 MG PO TABS
0.1000 mg | ORAL_TABLET | Freq: Three times a day (TID) | ORAL | Status: DC
  Administered 2013-05-16 – 2013-05-18 (×5): 0.1 mg via ORAL
  Filled 2013-05-16 (×10): qty 1

## 2013-05-16 MED ORDER — ONDANSETRON HCL 4 MG PO TABS: 4.0000 mg | ORAL_TABLET | Freq: Four times a day (QID) | ORAL | Status: DC | PRN

## 2013-05-16 MED ORDER — PREDNISONE 20 MG PO TABS
30.0000 mg | ORAL_TABLET | Freq: Every day | ORAL | Status: DC
  Administered 2013-05-18: 30 mg via ORAL
  Filled 2013-05-16 (×2): qty 1

## 2013-05-16 MED ORDER — REGADENOSON 0.4 MG/5ML IV SOLN
0.4000 mg | Freq: Once | INTRAVENOUS | Status: AC
  Administered 2013-05-17: 0.4 mg via INTRAVENOUS
  Filled 2013-05-16: qty 5

## 2013-05-16 MED ORDER — PANTOPRAZOLE SODIUM 40 MG PO TBEC
40.0000 mg | DELAYED_RELEASE_TABLET | Freq: Every day | ORAL | Status: DC
Start: 2013-05-16 — End: 2013-05-16
  Administered 2013-05-16: 40 mg via ORAL
  Filled 2013-05-16: qty 1

## 2013-05-16 MED ORDER — PREDNISONE 10 MG PO TABS: 10.0000 mg | ORAL_TABLET | Freq: Every day | ORAL | Status: DC

## 2013-05-16 MED ORDER — DIGOXIN 125 MCG PO TABS
0.1250 mg | ORAL_TABLET | Freq: Every morning | ORAL | Status: DC
  Administered 2013-05-16 – 2013-05-18 (×3): 0.125 mg via ORAL
  Filled 2013-05-16 (×3): qty 1

## 2013-05-16 MED ORDER — ONDANSETRON HCL 4 MG/2ML IJ SOLN
4.0000 mg | Freq: Four times a day (QID) | INTRAMUSCULAR | Status: DC | PRN
Start: 2013-05-16 — End: 2013-05-18

## 2013-05-16 MED ORDER — AMIODARONE HCL 200 MG PO TABS
200.0000 mg | ORAL_TABLET | Freq: Every day | ORAL | Status: DC
  Administered 2013-05-16 – 2013-05-18 (×3): 200 mg via ORAL
  Filled 2013-05-16 (×3): qty 1

## 2013-05-16 MED ORDER — BUDESONIDE 0.25 MG/2ML IN SUSP
0.2500 mg | Freq: Two times a day (BID) | RESPIRATORY_TRACT | Status: DC
  Administered 2013-05-16 – 2013-05-18 (×5): 0.25 mg via RESPIRATORY_TRACT
  Filled 2013-05-16 (×8): qty 2

## 2013-05-16 MED ORDER — NITROGLYCERIN 0.4 MG SL SUBL: 0.4000 mg | SUBLINGUAL_TABLET | SUBLINGUAL | Status: DC | PRN

## 2013-05-16 MED ORDER — PREDNISONE 20 MG PO TABS
40.0000 mg | ORAL_TABLET | Freq: Every day | ORAL | Status: AC
  Administered 2013-05-16 – 2013-05-17 (×2): 40 mg via ORAL
  Filled 2013-05-16 (×2): qty 2

## 2013-05-16 MED ORDER — FUROSEMIDE 10 MG/ML IJ SOLN
40.0000 mg | Freq: Once | INTRAMUSCULAR | Status: AC
  Administered 2013-05-16: 40 mg via INTRAVENOUS
  Filled 2013-05-16: qty 4

## 2013-05-16 MED ORDER — INSULIN ASPART 100 UNIT/ML ~~LOC~~ SOLN
0.0000 [IU] | Freq: Three times a day (TID) | SUBCUTANEOUS | Status: DC
Start: 2013-05-16 — End: 2013-05-18
  Administered 2013-05-16: 3 [IU] via SUBCUTANEOUS
  Administered 2013-05-16: 1 [IU] via SUBCUTANEOUS
  Administered 2013-05-17: 2 [IU] via SUBCUTANEOUS
  Administered 2013-05-17: 5 [IU] via SUBCUTANEOUS
  Administered 2013-05-17: 2 [IU] via SUBCUTANEOUS
  Administered 2013-05-18: 1 [IU] via SUBCUTANEOUS
  Administered 2013-05-18: 2 [IU] via SUBCUTANEOUS

## 2013-05-16 MED ORDER — LEVALBUTEROL HCL 0.63 MG/3ML IN NEBU
0.6300 mg | INHALATION_SOLUTION | Freq: Four times a day (QID) | RESPIRATORY_TRACT | Status: DC
  Administered 2013-05-16 – 2013-05-18 (×8): 0.63 mg via RESPIRATORY_TRACT
  Filled 2013-05-16 (×17): qty 3

## 2013-05-16 MED ORDER — RAMIPRIL 2.5 MG PO CAPS
2.5000 mg | ORAL_CAPSULE | Freq: Every day | ORAL | Status: DC
  Administered 2013-05-16 – 2013-05-18 (×3): 2.5 mg via ORAL
  Filled 2013-05-16 (×3): qty 1

## 2013-05-16 MED ORDER — PANTOPRAZOLE SODIUM 40 MG PO TBEC
40.0000 mg | DELAYED_RELEASE_TABLET | Freq: Two times a day (BID) | ORAL | Status: DC
  Administered 2013-05-16 – 2013-05-18 (×4): 40 mg via ORAL
  Filled 2013-05-16 (×3): qty 1

## 2013-05-16 MED ORDER — ROSUVASTATIN CALCIUM 10 MG PO TABS
10.0000 mg | ORAL_TABLET | Freq: Every day | ORAL | Status: DC
  Administered 2013-05-16 – 2013-05-17 (×2): 10 mg via ORAL
  Filled 2013-05-16 (×3): qty 1

## 2013-05-16 MED ORDER — ALBUTEROL SULFATE (2.5 MG/3ML) 0.083% IN NEBU
2.5000 mg | INHALATION_SOLUTION | Freq: Four times a day (QID) | RESPIRATORY_TRACT | Status: DC | PRN
  Administered 2013-05-18: 2.5 mg via RESPIRATORY_TRACT
  Filled 2013-05-16: qty 3

## 2013-05-16 MED ORDER — SALINE SPRAY 0.65 % NA SOLN
1.0000 | Freq: Four times a day (QID) | NASAL | Status: DC | PRN
  Filled 2013-05-16: qty 44

## 2013-05-16 MED ORDER — LEVALBUTEROL HCL 1.25 MG/0.5ML IN NEBU: 1.2500 mg | INHALATION_SOLUTION | Freq: Four times a day (QID) | RESPIRATORY_TRACT | Status: DC

## 2013-05-16 MED ORDER — LOSARTAN POTASSIUM 50 MG PO TABS
100.0000 mg | ORAL_TABLET | Freq: Every day | ORAL | Status: DC
  Administered 2013-05-16 – 2013-05-18 (×3): 100 mg via ORAL
  Filled 2013-05-16 (×3): qty 2

## 2013-05-16 MED ORDER — WARFARIN - PHARMACIST DOSING INPATIENT
Freq: Every day | Status: DC
  Administered 2013-05-16: 18:00:00

## 2013-05-16 MED ORDER — CARVEDILOL 25 MG PO TABS: 25.0000 mg | ORAL_TABLET | Freq: Two times a day (BID) | ORAL | Status: DC

## 2013-05-16 MED ORDER — METOPROLOL TARTRATE 50 MG PO TABS
75.0000 mg | ORAL_TABLET | Freq: Two times a day (BID) | ORAL | Status: DC
  Administered 2013-05-16 – 2013-05-18 (×5): 75 mg via ORAL
  Filled 2013-05-16 (×6): qty 1

## 2013-05-16 MED ORDER — SODIUM CHLORIDE 0.9 % IJ SOLN
3.0000 mL | Freq: Two times a day (BID) | INTRAMUSCULAR | Status: DC
Start: 2013-05-16 — End: 2013-05-18
  Administered 2013-05-16 – 2013-05-18 (×6): 3 mL via INTRAVENOUS

## 2013-05-16 MED ORDER — ACETAMINOPHEN 325 MG PO TABS
650.0000 mg | ORAL_TABLET | Freq: Four times a day (QID) | ORAL | Status: DC | PRN
  Administered 2013-05-16 – 2013-05-17 (×3): 650 mg via ORAL
  Filled 2013-05-16 (×3): qty 2

## 2013-05-16 MED ORDER — ACETAMINOPHEN 650 MG RE SUPP: 650.0000 mg | Freq: Four times a day (QID) | RECTAL | Status: DC | PRN

## 2013-05-16 MED ORDER — ARFORMOTEROL TARTRATE 15 MCG/2ML IN NEBU
15.0000 ug | INHALATION_SOLUTION | Freq: Two times a day (BID) | RESPIRATORY_TRACT | Status: DC
  Administered 2013-05-16 – 2013-05-18 (×5): 15 ug via RESPIRATORY_TRACT
  Filled 2013-05-16 (×9): qty 2

## 2013-05-16 MED ORDER — MORPHINE SULFATE 2 MG/ML IJ SOLN
2.0000 mg | INTRAMUSCULAR | Status: DC | PRN
  Administered 2013-05-16: 2 mg via INTRAVENOUS
  Filled 2013-05-16: qty 1

## 2013-05-16 MED ORDER — PREDNISONE 20 MG PO TABS: 20.0000 mg | ORAL_TABLET | Freq: Every day | ORAL | Status: DC

## 2013-05-16 MED ORDER — BIOTENE DRY MOUTH MT LIQD: 15.0000 mL | Freq: Four times a day (QID) | OROMUCOSAL | Status: DC | PRN

## 2013-05-16 MED ORDER — FUROSEMIDE 10 MG/ML IJ SOLN
40.0000 mg | Freq: Two times a day (BID) | INTRAMUSCULAR | Status: DC
  Administered 2013-05-16 – 2013-05-18 (×5): 40 mg via INTRAVENOUS
  Filled 2013-05-16 (×8): qty 4

## 2013-05-16 MED ORDER — PRASUGREL HCL 10 MG PO TABS
10.0000 mg | ORAL_TABLET | Freq: Every day | ORAL | Status: DC
  Administered 2013-05-16 – 2013-05-18 (×3): 10 mg via ORAL
  Filled 2013-05-16 (×4): qty 1

## 2013-05-16 NOTE — Progress Notes (Signed)
Pt. C/o dull chest pain. Stated the pain was 9/10 in lower left lobe area. PRN pain medication administered. RN will continue to monitor pt. Boysie Bonebrake, Cheryll Dessert

## 2013-05-16 NOTE — Consult Note (Signed)
CARDIOLOGY CONSULT NOTE   Patient ID: Danny [name] MRN: [id number], DOB/AGE: Aug 11, 1955   Admit date: 05/15/2013 Date of Consult: 05/16/2013  Primary Physician: Isabella StallingNDIEGO,RICHARD M, MD Primary Cardiologist: Ival BibleS. McDowell, MD  Pt. Profile 58 yr old AA male status post PCI/DES of RCA 1/21 with hx of PCI/DES stenting of OM 04/2012, RV thrombus, systolic/ diastolic HF (EF 50-55%), Cor Pulmonale, PAF, PAT, DM, and partial left/right upper lobectomy (2002), [facility] complaining of increased SOB over a 3 day period.   Problem List  Past Medical History  Diagnosis Date  . GERD (gastroesophageal reflux disease)   . COPD (chronic obstructive pulmonary disease)   . Chronic combined systolic and diastolic CHF (congestive heart failure)   . Essential hypertension, benign   . Cardiomyopathy     a. previous LVEF 40-45%;  b. 03/2013 Echo: EF 50-55%, Gr 1 DD, sev dil RV/RA with sev RV dysfxn, PASP 51mmHg. No evidence of RV thrombus.  . Right ventricular mural thrombus     a. On Coumadin  . Cor pulmonale     a. Severe RV dysfunction;  b. 04/2013 R heart cath: RA 17, RV 70/18 (22), PA 73/52.  . Coronary atherosclerosis of native coronary artery     a. 05/2012 MI/PCI: DES to OM [facility];  b. 04/2013 Cath/PCI: LM nl, LAD nonobs, LCX patent mid stent, RCA dom, 666m (2.5x16 Promus DES).  Marland Kitchen. PAF (paroxysmal atrial fibrillation)   . Spontaneous pneumothorax 2002    Bilateral  . PAT (paroxysmal atrial tachycardia)     a. on amio/bb.  . Diabetes mellitus without complication     Past Surgical History  Procedure Laterality Date  . 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 Coronary [place]    Allergies  Allergies  Allergen Reactions  . Lipitor [Atorvastatin] Other (See Comments)    Muscle spasms    HPI   58 y.o.  AA male presented to the ER 05/15/2013 for increasing SOB, DOE, and LE edema over a 3  day period.  He has a h/o CAD s/p recent stenting of the RCA in 04/2013.  He also has a h/o chronic O2 dependent lung dzs/bullous emphysema, secondary PAH, and had previously been worked up at [facility] for [facility] of transplant but was recently taken off the list 2/2 ongoing cardiac issues and DM.  Creat was also elevated @ last [facility] visit, 1.9 per wife (nl on admission yesterday).  He is chronically anticoagulated 2/2 h/o RV thrombus though no thrombus was noted on echo in 03/2013. He was last hospitalized 04/19/2013 for PAT, [place]. At that time he underwent PCI on 04/23/2013 and received a DES to the RCA and was also placed on amio 200mg  daily for PAT.  He was in his usoh until Wed of this week when he began cardiac rehab and while using the stepper noted mild DOE and fatigue.  This resolved with rest and he didn't think too much of it.  After rehab, he began to feel progressively fatigued and began to experience DOE and also mild LEE.  By Thursday morning, his dyspnea was much worse and he could barely walk across a room or carry on a conversation.  His appetite was poor and he did note some increase in abd girth, though he says that his weight has been very steady since d/c from the hospital in January.  He denies  dietary indiscretions.  In the setting of progressive dyspnea, he also noted orthopnea and simply couldn't get comfortable lying down.  He then began to experience retrosternal chest squeezing which seemed to move from the center of his chest, down to his left lower chest and then around to his back.  He called EMS and was taken into the ED.  Here, pBNP was elevated @ 9941.  CXR showed no acute findings. ECG showed no acute st/t changed.  Ti was nl.  He was felt to have mild volume overload on exam and he was admitted and placed on IV lasix and a steroid taper.  This AM he had an episode of chest pain associated with diaphoresis and tunnel vision while standing to void.  This subsided  spontaneously.  Currently his breathing is much better than it was yesterday but not quite at baseline.  He's not having chest pain at this time.  CE remain neg.  Inpatient Medications  . amiodarone  200 mg Oral Daily  . arformoterol  15 mcg Nebulization BID  . budesonide  0.25 mg Nebulization BID  . cloNIDine  0.1 mg Oral TID  . digoxin  0.125 mg Oral q morning - 10a  . furosemide  40 mg Intravenous Q12H  . insulin aspart  0-9 Units Subcutaneous TID WC  . levalbuterol  0.63 mg Nebulization QID  . losartan  100 mg Oral Daily  . metoprolol  75 mg Oral BID  . pantoprazole  40 mg Oral Daily  . potassium chloride  10 mEq Oral QODAY  . prasugrel  10 mg Oral Daily  . predniSONE  40 mg Oral Q breakfast   Followed by  . [START ON 05/18/2013] predniSONE  30 mg Oral Q breakfast   Followed by  . [START ON 05/20/2013] predniSONE  20 mg Oral Q breakfast   Followed by  . [START ON 05/22/2013] predniSONE  10 mg Oral Q breakfast  . ramipril  2.5 mg Oral Daily  . rosuvastatin  10 mg Oral q1800  . sodium chloride  3 mL Intravenous Q12H  . warfarin  2.5 mg Oral q1800  . Warfarin - Pharmacist Dosing Inpatient   Does not apply q1800    Family History  No premature CAD.   Social History History   Social History  . Marital Status: Married    Spouse Name: N/A    Number of Children: N/A  . Years of Education: N/A   Occupational History  . Tour bus driver    Social History Main Topics  . Smoking status: Former Smoker -- 2.00 packs/day for 25 years    Types: Cigarettes    Quit date: 04/03/1994  . Smokeless tobacco: Never Used  . Alcohol Use: No  . Drug Use: No  . Sexual Activity: No   Other Topics Concern  . Not on file   Social History Narrative   Lives with wife in [place], [place].    Review of Systems  General:  No chills, fever, night sweats or weight changes.  Cardiovascular:  +++ chest pain, +++ dyspnea on exertion, +++ edema, +++ orthopnea, no palpitations, paroxysmal  nocturnal dyspnea. Dermatological: No rash, lesions/masses Respiratory: No cough, +++ dyspnea Urologic: No hematuria, dysuria Abdominal:   No nausea, vomiting, diarrhea, bright red blood per rectum, melena, or hematemesis Neurologic:  No visual changes, wkns, changes in mental status. All other systems reviewed and are otherwise negative except as noted above.  Physical Exam  Blood pressure 124/76, pulse 90, temperature  98.3 F (36.8 C), temperature source Oral, resp. rate 20, height 5\' 7"  (1.702 m), weight 156 lb 11.2 oz (71.079 kg), SpO2 92.00%.  General: Pleasant, NAD.  Psych: Normal affect. Neuro: Alert and oriented X 3. Moves all extremities spontaneously. HEENT: [place], AT, Sclera non-icteric, conjunctiva pale, non-injected. No thyromegaly  Neck: Supple without bruits. JVP ~ 10. Carotid upstroke 1-2 +.  Lungs:  Resp regular, unlabored. Decreased BS bilaterally, more so in the left base.  No crackles/wheezing. Heart: RRR no s3, s4, or murmurs.   Abdomen: Somewhat firm (pt says no different than baseline), non-tender, non-distended, BS + x 4. Hypoactive.  Extremities: + clubbing, edema 1+ LE R>L. No cyanosis. DP/PT/Radials 2+ and equal bilaterally.  Labs   Recent Labs  05/16/13 0432  TROPONINI <0.30   Lab Results  Component Value Date   WBC 15.6* 05/16/2013   HGB 12.6* 05/16/2013   HCT 40.7 05/16/2013   MCV 96.4 05/16/2013   PLT 298 05/16/2013    Recent Labs Lab 05/16/13 0432  NA 142  K 3.9  CL 95*  CO2 32  BUN 25*  CREATININE 1.31  CALCIUM 9.3  PROT 7.3  BILITOT 1.1  ALKPHOS 61  ALT 15  AST 16  GLUCOSE 153*   Lab Results  Component Value Date   CHOL 115 04/20/2013   HDL 32* 04/20/2013   LDLCALC 59 04/20/2013   TRIG 119 04/20/2013   Lab Results  Component Value Date   INR 2.78* 05/16/2013   INR 2.14* 05/15/2013   INR 4.6 05/08/2013    Radiology/Studies  Dg Chest 2 View  05/16/2013   CLINICAL DATA:  Cough.  Chest pain.  EXAM: CHEST  2 VIEW   IMPRESSION:  Chronic changes of the chest without acute cardiopulmonary disease.   Electronically Signed   By: Andreas Newport M.D.   On: 05/16/2013 00:23   ECG  05/16/2013: NSR, 92, biatrial enlargement, inf q's, no acute st/t changes.  ASSESSMENT AND PLAN  1.  Acute on chronic diastolic CHF:  Pt presented with a 24 hr h/o progressive dyspnea, lee, orthopnea, early satiety and increasing abd girth.  ProBNP on admission was elevated above his previous baseline.  Interestingly he says that his weight has been very steady at about 160 lbs on his home scale.  He has had some symptomatic improvement on IV lasix since admission.  He still appears to have mild volume overload.  Creat is up slightly in the setting of diuresis.  Look to switch to PO lasix tomorrow.  2.  [place]/CAD:  S/p PCI of the OM in early 2014 and PCI of the RCA last month.  He did well following procedure but yesterday had a prolonged episode of retrosternal chest pressure (2.5 hrs) and then had more c/p this morning.  Both episodes were reminiscent of prior angina.  Despite prolonged Ss yesterday, his CE remain negative and ECG is non-acute.  Will schedule a lexiscan cardiolite for the AM to r/o new ischemia.  If cardiolite high risk, would hold coumadin and hope to cath on Monday.  Cont effient, bb, statin.  He was not previously on asa 2/2 need for both effient and warfarin.  3.  PAT:  In sinus.  On amio 200 qd, digoxin, bb.  Amio likely not best choice for long term mgmt given lung dzs and relative youth.  We will need to consider this going forward.  4.  COPD/Bullous Emphysmea/secondary PAH:  On chronic steroids/inhalers.  Steroid taper per IM.  5.  H/O RV thrombus:  On chronic coumadin.  No thrombus on echo in 03/2013.  6.  HL:  Cont statin.  7.  HTN:  Stable.  Dennard Nip NP-C 05/16/2013, 7:34 AM  Patient seen and examined independently. Gilford Raid, NP note reviewed carefully - agree with his assessment and plan. I have  edited the note based on my findings.   Suspect majority of symptoms are related to volume overload and COPD. CP fairly atypical and ECG/CE negative despite prolonged symptoms. He is much improved after diuresis. Can switch back to po diuretics. Would put him on at least 80 po daily. Given recent RCA sten will order lexiscan Myoview in am.   Truman Hayward 3:17 PM

## 2013-05-16 NOTE — Progress Notes (Signed)
ANTICOAGULATION CONSULT NOTE - Initial Consult  Pharmacy Consult for Coumadin Indication: RV thrombus  Allergies  Allergen Reactions  . Lipitor [Atorvastatin] Other (See Comments)    Muscle spasms     Patient Measurements: Height: 5\' 7"  (170.2 cm) Weight: 156 lb 11.2 oz (71.079 kg) (scale A) IBW/kg (Calculated) : 66.1  Vital Signs: Temp: 98.3 F (36.8 C) (02/13 0301) Temp src: Oral (02/13 0301) BP: 124/76 mmHg (02/13 0301) Pulse Rate: 90 (02/13 0301)  Labs:  Recent Labs  05/15/13 0001  HGB 13.1  HCT 40.8  PLT 280  LABPROT 23.2*  INR 2.14*  CREATININE 1.27    Estimated Creatinine Clearance: 60.7 ml/min (by C-G formula based on Cr of 1.27).   Medical History: Past Medical History  Diagnosis Date  . GERD (gastroesophageal reflux disease)   . COPD (chronic obstructive pulmonary disease)   . Chronic combined systolic and diastolic CHF (congestive heart failure)   . Essential hypertension, benign   . Cardiomyopathy     a. previous LVEF 40-45%;  b. 03/2013 Echo: EF 50-55%, Gr 1 DD, sev dil RV/RA with sev RV dysfxn, PASP . No evidence of RV thrombus.  . Right ventricular mural thrombus     On Coumadin  . Cor pulmonale     a. Severe RV dysfunction;  b. 04/2013 R heart cath: RA 17, RV 70/18 (22), PA 73/52.  . Coronary atherosclerosis of native coronary artery     a. 05/2012 MI/PCI: DES to OM [facility];  b. 04/2013 Cath/PCI: LM nl, LAD nonobs, LCX patent mid stent, RCA dom, 94m (2.5x16 Promus DES).  Marland Kitchen PAF (paroxysmal atrial fibrillation)   . Spontaneous pneumothorax 2002    Bilateral  . PAT (paroxysmal atrial tachycardia)     a. on amio/bb.  . Diabetes mellitus without complication     Medications:  Prescriptions prior to admission  Medication Sig Dispense Refill  . acetaminophen (TYLENOL) 500 MG tablet Take 1,000 mg by mouth every 6 (six) hours as needed for moderate pain.       Marland Kitchen albuterol (PROVENTIL HFA;VENTOLIN HFA) 108 (90 BASE) MCG/ACT  inhaler Inhale 2 puffs into the lungs every 6 (six) hours as needed for wheezing or shortness of breath.       Marland Kitchen amiodarone (PACERONE) 200 MG tablet Take 1 tablet (200 mg total) by mouth daily.  30 tablet  6  . antiseptic oral rinse (BIOTENE) LIQD 15 mLs by Mouth Rinse route 4 (four) times daily as needed for dry mouth.      Marland Kitchen arformoterol (BROVANA) 15 MCG/2ML NEBU Take 15 mcg by nebulization 2 (two) times daily.      . budesonide (PULMICORT) 0.25 MG/2ML nebulizer solution Take 0.25 mg by nebulization 2 (two) times daily.      . carvedilol (COREG) 25 MG tablet Take 25 mg by mouth 2 (two) times daily with a meal.       . cloNIDine (CATAPRES) 0.1 MG tablet Take 0.1 mg by mouth 3 (three) times daily.      . clotrimazole (MYCELEX) 10 MG troche Take 10 mg by mouth daily.       . digoxin (LANOXIN) 0.125 MG tablet Take 0.125 mg by mouth every morning.      . furosemide (LASIX) 40 MG tablet Take 40-80 mg by mouth daily. *takes an extra 40mg  tablet if needed for swelling      . levalbuterol (XOPENEX) 1.25 MG/0.5ML nebulizer solution Take 1.25 mg by nebulization every 6 (six) hours.  1  each  12  . losartan (COZAAR) 100 MG tablet Take 100 mg by mouth every morning.       . metoprolol (LOPRESSOR) 50 MG tablet Take 1.5 tablets (75 mg total) by mouth 2 (two) times daily.  90 tablet  6  . nitroGLYCERIN (NITROSTAT) 0.4 MG SL tablet Place 1 tablet (0.4 mg total) under the tongue every 5 (five) minutes x 3 doses as needed for chest pain.  25 tablet  3  . omeprazole (PRILOSEC) 20 MG capsule Take 20 mg by mouth 2 (two) times daily.       . potassium chloride (K-DUR) 10 MEQ tablet Take 10 mEq by mouth every other day.      . prasugrel (EFFIENT) 10 MG TABS tablet Take 1 tablet (10 mg total) by mouth daily.  30 tablet  6  . predniSONE (DELTASONE) 10 MG tablet Take 10 mg by mouth See admin instructions. 40mg  a day for 2 days, then 30mg  a day for 2 days, then 20mg  a day for 2 days, then stop at 10mg  a day.wife states she  has one more week left as of 05-15-13      . predniSONE (DELTASONE) 5 MG tablet Take 1 tablet (5 mg total) by mouth daily with breakfast.  90 tablet  1  . ramipril (ALTACE) 2.5 MG capsule Take 2.5 mg by mouth daily.       . rosuvastatin (CRESTOR) 10 MG tablet Take 10 mg by mouth every evening.      . sodium chloride (OCEAN) 0.65 % nasal spray Place 1 spray into the nose every 6 (six) hours as needed for congestion.      Marland Kitchen. warfarin (COUMADIN) 2.5 MG tablet Take 2.5-3.75 mg by mouth daily. Take 2.5 mg tablet alternating with 3.75 mg  tablets every other day       Scheduled:  . amiodarone  200 mg Oral Daily  . arformoterol  15 mcg Nebulization BID  . atorvastatin  20 mg Oral q1800  . budesonide  0.25 mg Nebulization BID  . carvedilol  25 mg Oral BID WC  . cloNIDine  0.1 mg Oral TID  . digoxin  0.125 mg Oral q morning - 10a  . furosemide  40 mg Intravenous Q12H  . insulin aspart  0-9 Units Subcutaneous TID WC  . levalbuterol  1.25 mg Nebulization 4 times per day  . losartan  100 mg Oral BH-q7a  . metoprolol  75 mg Oral BID  . pantoprazole  40 mg Oral Daily  . potassium chloride  10 mEq Oral QODAY  . prasugrel  10 mg Oral Daily  . predniSONE  10 mg Oral See admin instructions  . ramipril  2.5 mg Oral Daily  . sodium chloride  3 mL Intravenous Q12H    Assessment: 58yo male c/o rib/chest pain, admitted for CHF exacerbation, to continue Coumadin for RV thrombus; admitted w/ therapeutic INR.  Goal of Therapy:  INR 2-3   Plan:  Will continue home Coumadin dose of 2.5mg  po daily and monitor INR for dose adjustments.  Vernard GamblesVeronda Debi Cousin, PharmD, BCPS  05/16/2013,3:50 AM

## 2013-05-16 NOTE — Progress Notes (Signed)
UR completed Chevonne Bostrom K. Simcha Speir, RN, BSN, MSHL, CCM  05/16/2013 8:48 AM

## 2013-05-16 NOTE — Progress Notes (Signed)
ANTICOAGULATION CONSULT NOTE - Follow Up  Pharmacy Consult for Coumadin Indication: RV thrombus  Allergies  Allergen Reactions  . Lipitor [Atorvastatin] Other (See Comments)    Muscle spasms    Patient Measurements: Height: 5\' 7"  (170.2 cm) Weight: 156 lb 11.2 oz (71.079 kg) (scale A) IBW/kg (Calculated) : 66.1  Vital Signs: Temp: 98.3 F (36.8 C) (02/13 0301) Temp src: Oral (02/13 0301) BP: 115/73 mmHg (02/13 1007) Pulse Rate: 94 (02/13 1007)  Labs:  Recent Labs  05/15/13 0001 05/16/13 0432 05/16/13 0805 05/16/13 0937  HGB 13.1 12.6*  --   --   HCT 40.8 40.7  --   --   PLT 280 298  --   --   LABPROT 23.2*  --   --   --   INR 2.14*  --   --   --   CREATININE 1.27 1.31  --   --   TROPONINI  --  <0.30 <0.30 <0.30   Estimated Creatinine Clearance: 58.9 ml/min (by C-G formula based on Cr of 1.31).  Medical History: Past Medical History  Diagnosis Date  . GERD (gastroesophageal reflux disease)   . COPD (chronic obstructive pulmonary disease)   . Chronic combined systolic and diastolic CHF (congestive heart failure)   . Essential hypertension, benign   . Cardiomyopathy     a. previous LVEF 40-45%;  b. 03/2013 Echo: EF 50-55%, Gr 1 DD, sev dil RV/RA with sev RV dysfxn, PASP 51mmHg. No evidence of RV thrombus.  . Right ventricular mural thrombus     a. On Coumadin  . Cor pulmonale     a. Severe RV dysfunction;  b. 04/2013 R heart cath: RA 17, RV 70/18 (22), PA 73/52.  . Coronary atherosclerosis of native coronary artery     a. 05/2012 MI/PCI: DES to OM [facility];  b. 04/2013 Cath/PCI: LM nl, LAD nonobs, LCX patent mid stent, RCA dom, 3730m (2.5x16 Promus DES).  Marland Kitchen. PAF (paroxysmal atrial fibrillation)   . Spontaneous pneumothorax 2002    Bilateral  . PAT (paroxysmal atrial tachycardia)     a. on amio/bb.  . Diabetes mellitus without complication    Medications:  Prescriptions prior to admission  Medication Sig Dispense Refill  . acetaminophen  (TYLENOL) 500 MG tablet Take 1,000 mg by mouth every 6 (six) hours as needed for moderate pain.       Marland Kitchen. albuterol (PROVENTIL HFA;VENTOLIN HFA) 108 (90 BASE) MCG/ACT inhaler Inhale 2 puffs into the lungs every 6 (six) hours as needed for wheezing or shortness of breath.       Marland Kitchen. amiodarone (PACERONE) 200 MG tablet Take 1 tablet (200 mg total) by mouth daily.  30 tablet  6  . antiseptic oral rinse (BIOTENE) LIQD 15 mLs by Mouth Rinse route 4 (four) times daily as needed for dry mouth.      Marland Kitchen. arformoterol (BROVANA) 15 MCG/2ML NEBU Take 15 mcg by nebulization 2 (two) times daily.      . budesonide (PULMICORT) 0.25 MG/2ML nebulizer solution Take 0.25 mg by nebulization 2 (two) times daily.      . carvedilol (COREG) 25 MG tablet Take 25 mg by mouth 2 (two) times daily with a meal.       . cloNIDine (CATAPRES) 0.1 MG tablet Take 0.1 mg by mouth 3 (three) times daily.      . clotrimazole (MYCELEX) 10 MG troche Take 10 mg by mouth daily.       . digoxin (LANOXIN) 0.125 MG  tablet Take 0.125 mg by mouth every morning.      . furosemide (LASIX) 40 MG tablet Take 40-80 mg by mouth daily. *takes an extra 40mg  tablet if needed for swelling      . levalbuterol (XOPENEX) 1.25 MG/0.5ML nebulizer solution Take 1.25 mg by nebulization every 6 (six) hours.  1 each  12  . losartan (COZAAR) 100 MG tablet Take 100 mg by mouth every morning.       . metoprolol (LOPRESSOR) 50 MG tablet Take 1.5 tablets (75 mg total) by mouth 2 (two) times daily.  90 tablet  6  . nitroGLYCERIN (NITROSTAT) 0.4 MG SL tablet Place 1 tablet (0.4 mg total) under the tongue every 5 (five) minutes x 3 doses as needed for chest pain.  25 tablet  3  . omeprazole (PRILOSEC) 20 MG capsule Take 20 mg by mouth 2 (two) times daily.       . potassium chloride (K-DUR) 10 MEQ tablet Take 10 mEq by mouth every other day.      . prasugrel (EFFIENT) 10 MG TABS tablet Take 1 tablet (10 mg total) by mouth daily.  30 tablet  6  . predniSONE (DELTASONE) 10 MG tablet  Take 10 mg by mouth See admin instructions. 40mg  a day for 2 days, then 30mg  a day for 2 days, then 20mg  a day for 2 days, then stop at 10mg  a day.wife states she has one more week left as of 05-15-13      . predniSONE (DELTASONE) 5 MG tablet Take 1 tablet (5 mg total) by mouth daily with breakfast.  90 tablet  1  . ramipril (ALTACE) 2.5 MG capsule Take 2.5 mg by mouth daily.       . rosuvastatin (CRESTOR) 10 MG tablet Take 10 mg by mouth every evening.      . sodium chloride (OCEAN) 0.65 % nasal spray Place 1 spray into the nose every 6 (six) hours as needed for congestion.      Marland Kitchen warfarin (COUMADIN) 2.5 MG tablet Take 2.5-3.75 mg by mouth daily. Take 2.5 mg tablet alternating with 3.75 mg  tablets every other day       Scheduled:  . amiodarone  200 mg Oral Daily  . arformoterol  15 mcg Nebulization BID  . budesonide  0.25 mg Nebulization BID  . cloNIDine  0.1 mg Oral TID  . digoxin  0.125 mg Oral q morning - 10a  . furosemide  40 mg Intravenous Q12H  . insulin aspart  0-9 Units Subcutaneous TID WC  . levalbuterol  0.63 mg Nebulization QID  . losartan  100 mg Oral Daily  . metoprolol  75 mg Oral BID  . pantoprazole  40 mg Oral Daily  . potassium chloride  10 mEq Oral QODAY  . prasugrel  10 mg Oral Daily  . predniSONE  40 mg Oral Q breakfast   Followed by  . [START ON 05/18/2013] predniSONE  30 mg Oral Q breakfast   Followed by  . [START ON 05/20/2013] predniSONE  20 mg Oral Q breakfast   Followed by  . [START ON 05/22/2013] predniSONE  10 mg Oral Q breakfast  . ramipril  2.5 mg Oral Daily  . rosuvastatin  10 mg Oral q1800  . sodium chloride  3 mL Intravenous Q12H  . warfarin  2.5 mg Oral q1800  . Warfarin - Pharmacist Dosing Inpatient   Does not apply q1800   Assessment: 58yo male c/o rib/chest pain, admitted for CHF exacerbation,  to continue Coumadin for RV thrombus; admitted w/ therapeutic INR and no protime drawn today.  He had episode of chest pain last evening and is awaiting  cardiology consult.  Goal of Therapy:  INR 2-3   Plan:  Will continue home Coumadin dose of 2.5mg  po daily and monitor INR for dose adjustments.  Nadara Mustard, PharmD., MS Clinical Pharmacist Pager:  [phone] Thank you for allowing pharmacy to be part of this patients care team. 05/16/2013,11:45 AM

## 2013-05-16 NOTE — Progress Notes (Signed)
Pt. Continues to c/o left chest pain. Pt. States the pain is in the lung area and is now a 3/10. Pt. Stating he needs something else for relief. Text page sent to MD. RN will continue to monitor pt. Adely Facer, Cheryll Dessert

## 2013-05-16 NOTE — Progress Notes (Signed)
Pt. Alert and oriented and in stable condition upon arrival to the floor. Wife at bedside. Pt. Oriented to room and call light placed within reach. No s/s of distress noted. Pt. Denies pain at this time. Pt. Placed on telemetry. CMT notified. Dr. Fredrich Romans at bedside to see pt. RN will continue to monitor pt. Ines Warf, Cheryll Dessert

## 2013-05-16 NOTE — Progress Notes (Signed)
PHARMACIST - PHYSICIAN COMMUNICATION DR:    DESCRIPTION: Pt is on both Coreg and Lopressor at home, duplication of therapy. It appears that Coreg was changed to Lopressor by Dr [place] during January 2015 admission for better rate control.  However, pt and his wife report that he takes both at home.  HR has been in 80s, SBP 99-143 since admission.  I have D/C'd Coreg for this admission; please monitor pt and adjust Lopressor if needed given dual BB at home.  Pt and family will also need education regarding BB switch.  Thank you.   Vernard Gambles, PharmD, BCPS 05/16/2013 4:12 AM

## 2013-05-16 NOTE — H&P (Signed)
[facility] History and Physical  Danny [name] XBJ:[id number] DOB: 11-30-1955 DOA: 05/15/2013  Referring physician: ER physician. PCP: Isabella StallingNDIEGO,RICHARD M, MD  Specialists: [facility].  Chief Complaint: Shortness of breath.  HPI: Danny BankWarren T [name] is a 58 y.o. male with known history of CAD status post recent stenting for unstable angina, severe COPD on home oxygen with cor pulmonale, probably thrombus on Coumadin, paroxysmal A. fib fibrillation, MAT, hyperglycemia presented to the ER because of increasing shortness of breath over last 3 days. Patient has been having increasing shortness of breath on exertion with edema of the lower extremities. Patient has had recent cardiac catheter and stent placed in the right RCA drug-eluting stent. Patient following which has had a COPD exacerbation and presently is on tapering dose of prednisone. Patient usually takes otherwise regularly 5 mg of prednisone daily. Patient also had [facility] and was told that he may not be a candidate as per the patient. In addition patient has been having nonspecific chest pain retrosternal sometimes increased with breathing sometimes otherwise unrelated although last few days. In the ER EKG and cardiac markers were negative chest x-ray was unremarkable. Labs reveal elevated BNP and on exam patient clinically looked more like a CHF exacerbation and was given Lasix 40 mg IV and admitted for further workup. Patient otherwise denies any nausea vomiting abdominal pain diarrhea. Denies any productive cough or fever chills.   Review of Systems: As presented in the history of presenting illness, rest negative.  Past Medical History  Diagnosis Date  . GERD (gastroesophageal reflux disease)   . COPD (chronic obstructive pulmonary disease)   . Chronic combined systolic and diastolic CHF (congestive heart failure)   . Essential hypertension, benign   . Cardiomyopathy      a. previous LVEF 40-45%;  b. 03/2013 Echo: EF 50-55%, Gr 1 DD, sev dil RV/RA with sev RV dysfxn, PASP 51mmHg. No evidence of RV thrombus.  . Right ventricular mural thrombus     On Coumadin  . Cor pulmonale     a. Severe RV dysfunction;  b. 04/2013 R heart cath: RA 17, RV 70/18 (22), PA 73/52.  . Coronary atherosclerosis of native coronary artery     a. 05/2012 MI/PCI: DES to OM [facility];  b. 04/2013 Cath/PCI: LM nl, LAD nonobs, LCX patent mid stent, RCA dom, 7560m (2.5x16 Promus DES).  Marland Kitchen. PAF (paroxysmal atrial fibrillation)   . Spontaneous pneumothorax 2002    Bilateral  . PAT (paroxysmal atrial tachycardia)     a. on amio/bb.  . Diabetes mellitus without complication    Past Surgical History  Procedure Laterality Date  . 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 Coronary [place] with stent [place]   Social History:  reports that he quit smoking about 19 years ago. His smoking use included Cigarettes. He has a 50 pack-year smoking history. He has never used smokeless tobacco. He reports that he does not drink alcohol or use illicit drugs. Where does patient live home. Can patient participate in ADLs? Yes.  Allergies  Allergen Reactions  . Lipitor [Atorvastatin] Other (See Comments)    Muscle spasms     Family History: History reviewed. No pertinent family history.    Prior to Admission medications   Medication Sig Start Date End Date Taking? Authorizing Provider  acetaminophen (TYLENOL) 500 MG tablet Take 1,000 mg by mouth every  6 (six) hours as needed for moderate pain.    Yes Historical Provider, MD  albuterol (PROVENTIL HFA;VENTOLIN HFA) 108 (90 BASE) MCG/ACT inhaler Inhale 2 puffs into the lungs every 6 (six) hours as needed for wheezing or shortness of breath.    Yes Historical Provider, MD  amiodarone (PACERONE) 200 MG tablet Take 1 tablet (200 mg total) by mouth daily. 04/27/13  Yes Ok Anis, NP   antiseptic oral rinse (BIOTENE) LIQD 15 mLs by Mouth Rinse route 4 (four) times daily as needed for dry mouth.   Yes Historical Provider, MD  arformoterol (BROVANA) 15 MCG/2ML NEBU Take 15 mcg by nebulization 2 (two) times daily.   Yes Historical Provider, MD  budesonide (PULMICORT) 0.25 MG/2ML nebulizer solution Take 0.25 mg by nebulization 2 (two) times daily.   Yes Historical Provider, MD  carvedilol (COREG) 25 MG tablet Take 25 mg by mouth 2 (two) times daily with a meal.  04/05/13  Yes Historical Provider, MD  cloNIDine (CATAPRES) 0.1 MG tablet Take 0.1 mg by mouth 3 (three) times daily.   Yes Historical Provider, MD  clotrimazole (MYCELEX) 10 MG troche Take 10 mg by mouth daily.  02/25/13  Yes Historical Provider, MD  digoxin (LANOXIN) 0.125 MG tablet Take 0.125 mg by mouth every morning.   Yes Historical Provider, MD  furosemide (LASIX) 40 MG tablet Take 40-80 mg by mouth daily. *takes an extra 40mg  tablet if needed for swelling   Yes Historical Provider, MD  levalbuterol (XOPENEX) 1.25 MG/0.5ML nebulizer solution Take 1.25 mg by nebulization every 6 (six) hours. 04/01/13  Yes Dorothea Ogle, MD  losartan (COZAAR) 100 MG tablet Take 100 mg by mouth every morning.  02/03/13  Yes Historical Provider, MD  metoprolol (LOPRESSOR) 50 MG tablet Take 1.5 tablets (75 mg total) by mouth 2 (two) times daily. 04/24/13  Yes Ok Anis, NP  nitroGLYCERIN (NITROSTAT) 0.4 MG SL tablet Place 1 tablet (0.4 mg total) under the tongue every 5 (five) minutes x 3 doses as needed for chest pain. 04/24/13  Yes Ok Anis, NP  omeprazole (PRILOSEC) 20 MG capsule Take 20 mg by mouth 2 (two) times daily.    Yes Historical Provider, MD  potassium chloride (K-DUR) 10 MEQ tablet Take 10 mEq by mouth every other day.   Yes Historical Provider, MD  prasugrel (EFFIENT) 10 MG TABS tablet Take 1 tablet (10 mg total) by mouth daily. 04/24/13  Yes Ok Anis, NP  predniSONE (DELTASONE) 10 MG tablet Take 10 mg  by mouth See admin instructions. 40mg  a day for 2 days, then 30mg  a day for 2 days, then 20mg  a day for 2 days, then stop at 10mg  a day.wife states she has one more week left as of 05-15-13 04/30/13  Yes Barbaraann Share, MD  predniSONE (DELTASONE) 5 MG tablet Take 1 tablet (5 mg total) by mouth daily with breakfast. 04/17/13  Yes Leslye Peer, MD  ramipril (ALTACE) 2.5 MG capsule Take 2.5 mg by mouth daily.  04/11/13  Yes Historical Provider, MD  rosuvastatin (CRESTOR) 10 MG tablet Take 10 mg by mouth every evening.   Yes Historical Provider, MD  sodium chloride (OCEAN) 0.65 % nasal spray Place 1 spray into the nose every 6 (six) hours as needed for congestion.   Yes Historical Provider, MD  warfarin (COUMADIN) 2.5 MG tablet Take 2.5-3.75 mg by mouth daily. Take 2.5 mg tablet alternating with 3.75 mg  tablets every other day   Yes Historical  Provider, MD    Physical Exam: Filed Vitals:   05/16/13 0145 05/16/13 0200 05/16/13 0215 05/16/13 0301  BP: 109/78 112/74 99/68 124/76  Pulse: 85 85 84 90  Temp:    98.3 F (36.8 C)  TempSrc:    Oral  Resp: 19 22 21 20   Height:    5\' 7"  (1.702 m)  Weight:    71.079 kg (156 lb 11.2 oz)  SpO2: 92% 95% 95% 92%     General:  Well-developed well-nourished.  Eyes: Anicteric no pallor.  ENT: No discharge from the ears eyes nose mouth.  Neck: No mass felt.  Cardiovascular: S1-S2 heard.  Respiratory: No rhonchi or crepitations.  Abdomen: Soft nontender bowel sounds present.  Skin: No rash.  Musculoskeletal: No edema.  Psychiatric: Appears normal.  Neurologic: Alert awake oriented to time place and person. Moves all extremities 5 x 5.  Labs on Admission:  Basic Metabolic Panel:  Recent Labs Lab 05/15/13 0001  NA 142  K 4.1  CL 95*  CO2 29  GLUCOSE 99  BUN 24*  CREATININE 1.27  CALCIUM 9.8   Liver Function Tests: No results found for this basename: AST, ALT, ALKPHOS, BILITOT, PROT, ALBUMIN,  in the last 168 hours No results found  for this basename: LIPASE, AMYLASE,  in the last 168 hours No results found for this basename: AMMONIA,  in the last 168 hours CBC:  Recent Labs Lab 05/15/13 0001  WBC 14.9*  HGB 13.1  HCT 40.8  MCV 95.8  PLT 280   Cardiac Enzymes: No results found for this basename: CKTOTAL, CKMB, CKMBINDEX, TROPONINI,  in the last 168 hours  BNP (last 3 results)  Recent Labs  03/29/13 1220 04/19/13 1332 05/15/13 0001  PROBNP 6129.0* 2727.0* 9941.0*   CBG: No results found for this basename: GLUCAP,  in the last 168 hours  Radiological Exams on Admission: Dg Chest 2 View  05/16/2013   CLINICAL DATA:  Cough.  Chest pain.  EXAM: CHEST  2 VIEW  COMPARISON:  DG CHEST 2 VIEW dated 04/19/2013; DG CHEST 1V PORT dated 03/29/2013; DG CHEST 1V PORT dated 03/17/2013; DG CHEST 1V PORT dated 02/13/2013; DG CHEST 1V PORT dated 10/23/2012; CT ANGIO CHEST W/CM &/OR WO/CM dated 10/23/2012  FINDINGS: Chronic cicatricial changes of the chest are present with multifocal opacity bilaterally. Postsurgical changes with long staples on the left. Monitoring leads project over the chest. There is no definite superimposed airspace disease in this patient with substantial chronic lung disease. Cardiopericardial silhouette unchanged. Paraseptal emphysema noted on prior CT.  IMPRESSION: Chronic changes of the chest without acute cardiopulmonary disease.   Electronically Signed   By: Andreas Newport M.D.   On: 05/16/2013 00:23    EKG: Independently reviewed. Normal sinus rhythm with nonspecific ST-T changes. Prolonged PR interval.  Assessment/Plan Principal Problem:   CHF (congestive heart failure) Active Problems:   Coronary atherosclerosis of native coronary artery   RV (right ventricular) mural thrombus   COPD (chronic obstructive pulmonary disease)   Pulmonary hypertension   Chronic respiratory failure   1. Decompensated CHF with history of cor pulmonale - patient has been placed on Lasix 40 mg IV every 12. Closely  follow intake output and metabolic panel. Cycle cardiac markers. Continue home medications. 2. CAD status post recent stenting with chest pain - on my exam patient was not having any chest pain at this time. We will cycle cardiac markers continue his antiplatelet agents. When necessary nitroglycerin. 3. COPD with cor pulmonale -  continue tapering dose of steroids and home inhalers. Presently patient is not actively wheezing. 4. Hyperglycemia - patient was told that his last hemoglobin A1c was 8.1 last week at [facility]. At this time I have placed patient on sliding-scale coverage with insulin. Patient may need definite antidiabetic medications based on his CBG checks. 5. History of atrial fibrillation and MAT - continue her medications including amiodarone and digoxin and check digoxin levels. Coumadin per pharmacy. 6. History of RV thrombus - Coumadin per pharmacy. 7. Leukocytosis - patient is afebrile. Closely follow CBC with differentials. May be related to prednisone therapy.  I have reviewed patient's old charts and labs. I have discussed with on-call cardiologist about patient's admission management.  Code Status: Full code.  Family Communication: Patient's wife at the bedside.  Disposition Plan: Admit to inpatient.    Amin Fornwalt N. [facility] Pager [phone].  If 7PM-7AM, please contact night-coverage [email or web address] Password [facility] 05/16/2013, 3:36 AM

## 2013-05-16 NOTE — ED Provider Notes (Signed)
CSN: [id number]     Arrival date & time 05/15/13  2233 History   First MD Initiated Contact with Patient 05/15/13 2307     Chief Complaint  Patient presents with  . Chest Pain     (Consider location/radiation/quality/duration/timing/severity/associated sxs/prior Treatment) HPI History provided by patient. Chest pain and dyspnea, more so with exertion. He has also noticed increased ankle swelling over the last few days despite taking increased amount of Lasix per his doctor. He is on 4 L oxygen continuously at home. His wife states he has required increased amount of oxygen with his oxygen saturation in the 70-80s tonight. Pain is sharp in quality, located substernal, no radiation of pain. Moderate severity. Past Medical History  Diagnosis Date  . GERD (gastroesophageal reflux disease)   . COPD (chronic obstructive pulmonary disease)   . Chronic combined systolic and diastolic CHF (congestive heart failure)   . Essential hypertension, benign   . Cardiomyopathy     a. previous LVEF 40-45%;  b. 03/2013 Echo: EF 50-55%, Gr 1 DD, sev dil RV/RA with sev RV dysfxn, PASP . No evidence of RV thrombus.  . Right ventricular mural thrombus     On Coumadin  . Cor pulmonale     a. Severe RV dysfunction;  b. 04/2013 R heart cath: RA 17, RV 70/18 (22), PA 73/52.  . Coronary atherosclerosis of native coronary artery     a. 05/2012 MI/PCI: DES to OM [facility];  b. 04/2013 Cath/PCI: LM nl, LAD nonobs, LCX patent mid stent, RCA dom, 29m (2.5x16 Promus DES).  Marland Kitchen PAF (paroxysmal atrial fibrillation)   . Spontaneous pneumothorax 2002    Bilateral  . PAT (paroxysmal atrial tachycardia)     a. on amio/bb.  . Diabetes mellitus without complication    Past Surgical History  Procedure Laterality Date  . 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 Coronary angioplasty with stent placement  04/2012    in [place], [place]   History reviewed. No pertinent family history. History   Substance Use Topics  . Smoking status: Former Smoker -- 2.00 packs/day for 25 years    Types: Cigarettes    Quit date: 04/03/1994  . Smokeless tobacco: Never Used  . Alcohol Use: No    Review of Systems  Constitutional: Negative for fever and chills.  Respiratory: Positive for shortness of breath.   Cardiovascular: Positive for chest pain and leg swelling.  Gastrointestinal: Negative for abdominal pain.  Genitourinary: Negative for flank pain.  Musculoskeletal: Negative for back pain, neck pain and neck stiffness.  Skin: Negative for rash.  Neurological: Negative for headaches.  All other systems reviewed and are negative.      Allergies  Lipitor  Home Medications  No current outpatient prescriptions on file. BP 124/76  Pulse 90  Temp(Src) 98.3 F (36.8 C) (Oral)  Resp 20  Ht 5\' 7"  (1.702 m)  Wt 156 lb 11.2 oz (71.079 kg)  BMI 24.54 kg/m2  SpO2 92% Physical Exam  Constitutional: He is oriented to person, place, and time. He appears well-developed and well-nourished.  HENT:  Head: Normocephalic and atraumatic.  Eyes: EOM are normal. Pupils are equal, round, and reactive to light.  Neck: Neck supple.  Cardiovascular: Normal rate, regular rhythm and intact distal pulses.   Pulmonary/Chest:  Decreased bilateral breath sounds. No reproducible tenderness. No wheezes or rales appreciated  Abdominal: Soft. There is no tenderness.  Musculoskeletal: Normal range of motion. He exhibits no tenderness.  2+  symmetric lower extremity pitting edema. No Calf tenderness or erythema  Neurological: He is alert and oriented to person, place, and time.  Skin: Skin is warm and dry.    ED Course  Procedures (including critical care time) Labs Review Labs Reviewed  CBC - Abnormal; Notable for the following:    WBC 14.9 (*)    All other components within normal limits  BASIC METABOLIC PANEL - Abnormal; Notable for the following:    Chloride 95 (*)    BUN 24 (*)    GFR calc non  Af Amer 62 (*)    GFR calc Af Amer 71 (*)    All other components within normal limits  PRO B NATRIURETIC PEPTIDE - Abnormal; Notable for the following:    Pro B Natriuretic peptide (BNP) 9941.0 (*)    All other components within normal limits  PROTIME-INR - Abnormal; Notable for the following:    Prothrombin Time 23.2 (*)    INR 2.14 (*)    All other components within normal limits  COMPREHENSIVE METABOLIC PANEL - Abnormal; Notable for the following:    Chloride 95 (*)    Glucose, Bld 153 (*)    BUN 25 (*)    Albumin 3.4 (*)    GFR calc non Af Amer 59 (*)    GFR calc Af Amer 69 (*)    All other components within normal limits  CBC WITH DIFFERENTIAL - Abnormal; Notable for the following:    WBC 15.6 (*)    Hemoglobin 12.6 (*)    Neutro Abs 12.0 (*)    Monocytes Absolute 1.5 (*)    All other components within normal limits  GLUCOSE, CAPILLARY - Abnormal; Notable for the following:    Glucose-Capillary 125 (*)    All other components within normal limits  TROPONIN I  DIGOXIN LEVEL  TROPONIN I  TROPONIN I  TSH  PROTIME-INR  POCT I-STAT TROPONIN I   Imaging Review Dg Chest 2 View  05/16/2013   CLINICAL DATA:  Cough.  Chest pain.  EXAM: CHEST  2 VIEW  COMPARISON:  DG CHEST 2 VIEW dated 04/19/2013; DG CHEST 1V PORT dated 03/29/2013; DG CHEST 1V PORT dated 03/17/2013; DG CHEST 1V PORT dated 02/13/2013; DG CHEST 1V PORT dated 10/23/2012; CT ANGIO CHEST W/CM &/OR WO/CM dated 10/23/2012  FINDINGS: Chronic cicatricial changes of the chest are present with multifocal opacity bilaterally. Postsurgical changes with long staples on the left. Monitoring leads project over the chest. There is no definite superimposed airspace disease in this patient with substantial chronic lung disease. Cardiopericardial silhouette unchanged. Paraseptal emphysema noted on prior CT.  IMPRESSION: Chronic changes of the chest without acute cardiopulmonary disease.   Electronically Signed   By: Andreas NewportGeoffrey  Lamke M.D.    On: 05/16/2013 00:23    Date: 05/16/2013  Rate: 79  Rhythm: normal sinus rhythm  QRS Axis: normal  Intervals: QT prolonged  ST/T Wave abnormalities: nonspecific ST/T changes  Conduction Disutrbances:none  Narrative Interpretation:   Old EKG Reviewed: unchanged   IV Lasix provided  MED consulted, Dr Toniann FailKakrakandy will admit, CAR consulted given recent MI/ stent now with CHF   MDM   Final diagnoses:  CHF (congestive heart failure)  Chronic respiratory failure  COPD (chronic obstructive pulmonary disease)  Essential hypertension, benign   EKG, chest x-ray, labs obtained and reviewed as above Given dyspnea on exertion with ankle edema, treated for CHF exacerbation Has sig COPD, and new O2 requirement, MED admit     Arlys JohnBrian  Dierdre Highman, MD 05/16/13 [phone]

## 2013-05-16 NOTE — Progress Notes (Signed)
Patient seen and examined. Admitted after midnight secondary to SOB and atypical CP. Troponin neg, no ischemic changes on EKG and/or telemetry. Please referred to Dr. Toniann Fail H&P for further info/details on admission.  Plan: -treatment for CHF exacerbation (CXR showing vascular congestion, elevated BNP and signs of fluid overload) -cardiology has been consulted and will follow rec's -continue PPI for GERD -continue tapering steroids for COPD  Danny [name] [phone]

## 2013-05-16 NOTE — Progress Notes (Signed)
The Nurse notified me that patient has been having chest pain. I have ordered morphine and ordered troponins and EKG stat. I have kept patient n.p.o. except medications and have consulted cardiology. I have notified the on coming hospitalist Dr. Patric Dykes.  Midge Minium.

## 2013-05-16 NOTE — Progress Notes (Signed)
05/16/13 1720 patient sats 80 on 5L [place] i called courtney RN  We then placed him on a vent mask patient's sats are 80-85%. I then got a  Part NBR  Patient's sats came up 100%. Coutrney RN has placed a called to the DR. i put the patient back on vent mask of 50%.  RT will continue  to monitor. Pt is stable.

## 2013-05-17 ENCOUNTER — Inpatient Hospital Stay (HOSPITAL_COMMUNITY): Payer: Managed Care, Other (non HMO)

## 2013-05-17 DIAGNOSIS — R079 Chest pain, unspecified: Secondary | ICD-10-CM

## 2013-05-17 DIAGNOSIS — I4891 Unspecified atrial fibrillation: Secondary | ICD-10-CM

## 2013-05-17 LAB — PROTIME-INR
INR: 3.43 — ABNORMAL HIGH (ref 0.00–1.49)
Prothrombin Time: 33.3 seconds — ABNORMAL HIGH (ref 11.6–15.2)

## 2013-05-17 LAB — GLUCOSE, CAPILLARY
GLUCOSE-CAPILLARY: 187 mg/dL — AB (ref 70–99)
GLUCOSE-CAPILLARY: 255 mg/dL — AB (ref 70–99)
Glucose-Capillary: 159 mg/dL — ABNORMAL HIGH (ref 70–99)
Glucose-Capillary: 146 mg/dL — ABNORMAL HIGH (ref 70–99)

## 2013-05-17 MED ORDER — TECHNETIUM TC 99M SESTAMIBI - CARDIOLITE
30.0000 | Freq: Once | INTRAVENOUS | Status: AC | PRN
  Administered 2013-05-17: 11:00:00 30 via INTRAVENOUS

## 2013-05-17 MED ORDER — REGADENOSON 0.4 MG/5ML IV SOLN
INTRAVENOUS | Status: AC
  Filled 2013-05-17: qty 5

## 2013-05-17 MED ORDER — TECHNETIUM TC 99M SESTAMIBI - CARDIOLITE
10.0000 | Freq: Once | INTRAVENOUS | Status: AC | PRN
  Administered 2013-05-17: 10 via INTRAVENOUS

## 2013-05-17 NOTE — Progress Notes (Signed)
Subjective: Still mildly SOB. Denies CP.   O2 sats are 91% on face mask while getting nebs.    Objective: Vital signs in last 24 hours: Temp:  [97 F (36.1 C)-98 F (36.7 C)] 98 F (36.7 C) (02/14 0431) Pulse Rate:  [74-88] 74 (02/14 0431) Resp:  [18-20] 18 (02/14 0431) BP: (92-116)/(54-74) 98/67 mmHg (02/14 1035) SpO2:  [90 %-95 %] 94 % (02/14 0431) Weight:  [157 lb 3.2 oz (71.305 kg)] 157 lb 3.2 oz (71.305 kg) (02/14 0431) Last BM Date: 05/16/13  Intake/Output from previous day: 02/13 0701 - 02/14 0700 In: 360 [P.O.:360] Out: 1000 [Urine:1000] Intake/Output this shift:    Medications Current Facility-Administered Medications  Medication Dose Route Frequency Provider Last Rate Last Dose  . acetaminophen (TYLENOL) tablet 650 mg  650 mg Oral Q6H PRN Eduard ClosArshad N Kakrakandy, MD   650 mg at 05/16/13 1656   Or  . acetaminophen (TYLENOL) suppository 650 mg  650 mg Rectal Q6H PRN Eduard ClosArshad N Kakrakandy, MD      . albuterol (PROVENTIL) (2.5 MG/3ML) 0.083% nebulizer solution 2.5 mg  2.5 mg Inhalation Q6H PRN Eduard ClosArshad N Kakrakandy, MD      . amiodarone (PACERONE) tablet 200 mg  200 mg Oral Daily Eduard ClosArshad N Kakrakandy, MD   200 mg at 05/16/13 1010  . antiseptic oral rinse (BIOTENE) solution 15 mL  15 mL Mouth Rinse QID PRN Eduard ClosArshad N Kakrakandy, MD      . arformoterol [facility](BROVANA) nebulizer solution 15 mcg  15 mcg Nebulization BID Eduard ClosArshad N Kakrakandy, MD   15 mcg at 05/16/13 2126  . budesonide (PULMICORT) nebulizer solution 0.25 mg  0.25 mg Nebulization BID Eduard ClosArshad N Kakrakandy, MD   0.25 mg at 05/16/13 2126  . cloNIDine (CATAPRES) tablet 0.1 mg  0.1 mg Oral TID Eduard ClosArshad N Kakrakandy, MD   0.1 mg at 05/16/13 2229  . digoxin (LANOXIN) tablet 0.125 mg  0.125 mg Oral q morning - 10a Eduard ClosArshad N Kakrakandy, MD   0.125 mg at 05/16/13 1010  . furosemide (LASIX) injection 40 mg  40 mg Intravenous Q12H Eduard ClosArshad N Kakrakandy, MD   40 mg at 05/17/13 [id number]  . insulin aspart (novoLOG) injection 0-9 Units  0-9 Units  Subcutaneous TID WC Eduard ClosArshad N Kakrakandy, MD   2 Units at 05/17/13 [phone]  . levalbuterol (XOPENEX) nebulizer solution 0.63 mg  0.63 mg Nebulization QID Eduard ClosArshad N Kakrakandy, MD   0.63 mg at 05/16/13 2126  . losartan (COZAAR) tablet 100 mg  100 mg Oral Daily Eduard ClosArshad N Kakrakandy, MD   100 mg at 05/16/13 1010  . metoprolol tartrate (LOPRESSOR) tablet 75 mg  75 mg Oral BID Eduard ClosArshad N Kakrakandy, MD   75 mg at 05/16/13 2230  . morphine 2 MG/ML injection 2 mg  2 mg Intravenous Q2H PRN Eduard ClosArshad N Kakrakandy, MD   2 mg at 05/16/13 0806  . nitroGLYCERIN (NITROSTAT) SL tablet 0.4 mg  0.4 mg Sublingual Q5 Min x 3 PRN Eduard ClosArshad N Kakrakandy, MD      . ondansetron [facility](ZOFRAN) tablet 4 mg  4 mg Oral Q6H PRN Eduard ClosArshad N Kakrakandy, MD       Or  . ondansetron [facility](ZOFRAN) injection 4 mg  4 mg Intravenous Q6H PRN Eduard ClosArshad N Kakrakandy, MD      . pantoprazole (PROTONIX) EC tablet 40 mg  40 mg Oral BID Vassie Lollarlos Madera, MD   40 mg at 05/16/13 2223  . potassium chloride (K-DUR) CR tablet 10 mEq  10 mEq Oral QODAY Arshad N  Toniann Fail, MD   10 mEq at 05/16/13 1010  . prasugrel (EFFIENT) tablet 10 mg  10 mg Oral Daily Eduard Clos, MD   10 mg at 05/16/13 1010  . [START ON 05/18/2013] predniSONE (DELTASONE) tablet 30 mg  30 mg Oral Q breakfast Eduard Clos, MD       Followed by  . [START ON 05/20/2013] predniSONE (DELTASONE) tablet 20 mg  20 mg Oral Q breakfast Eduard Clos, MD       Followed by  . [START ON 05/22/2013] predniSONE (DELTASONE) tablet 10 mg  10 mg Oral Q breakfast Eduard Clos, MD      . ramipril (ALTACE) capsule 2.5 mg  2.5 mg Oral Daily Eduard Clos, MD   2.5 mg at 05/16/13 1009  . regadenoson (LEXISCAN) 0.4 MG/5ML injection SOLN           . rosuvastatin (CRESTOR) tablet 10 mg  10 mg Oral q1800 Eduard Clos, MD   10 mg at 05/16/13 1755  . sodium chloride (OCEAN) 0.65 % nasal spray 1 spray  1 spray Each Nare Q6H PRN Eduard Clos, MD      . sodium chloride 0.9 % injection 3 mL  3 mL  Intravenous Q12H Eduard Clos, MD   3 mL at 05/16/13 2225  . warfarin (COUMADIN) tablet 2.5 mg  2.5 mg Oral q1800 Colleen Can, RPH   2.5 mg at 05/16/13 1755  . Warfarin - Pharmacist Dosing Inpatient   Does not apply q1800 Colleen Can, [facility]        PE: General appearance: alert, cooperative and no distress Lungs: decreased breath sounds Heart: regular rate and rhythm Extremities: 1+ bilateral LEE Pulses: 2+ and symmetric Skin: warm and dry Neurologic: Grossly normal  Lab Results:   Recent Labs  05/15/13 0001 05/16/13 0432  WBC 14.9* 15.6*  HGB 13.1 12.6*  HCT 40.8 40.7  PLT 280 298   BMET  Recent Labs  05/15/13 0001 05/16/13 0432  NA 142 142  K 4.1 3.9  CL 95* 95*  CO2 29 32  GLUCOSE 99 153*  BUN 24* 25*  CREATININE 1.27 1.31  CALCIUM 9.8 9.3   PT/INR  Recent Labs  05/15/13 0001 05/16/13 1200 05/17/13 0540  LABPROT 23.2* 28.4* 33.3*  INR 2.14* 2.78* 3.43*   Cardiac Panel (last 3 results)  Recent Labs  05/16/13 0805 05/16/13 0937 05/16/13 1608  TROPONINI <0.30 <0.30 <0.30   Assessment/Plan    Principal Problem:   CHF (congestive heart failure) Active Problems:   Coronary atherosclerosis of native coronary artery   RV (right ventricular) mural thrombus   COPD (chronic obstructive pulmonary disease)   Pulmonary hypertension   Chronic respiratory failure   Acute on chronic diastolic heart failure  Plan: Cardiac enzymes negative x 3. Lexiscan NST completed w/o any significant EKG changes. Pt had mild nausea during test. Symptoms resolved by completion. Radiologist/ cardiologist interpretation to follow.     LOS: 2 days    Brittainy M. Delmer Islam 05/17/2013 11:09 AM  Attending Note:   The patient was seen and examined.  Agree with assessment and plan as noted above.  Changes made to the above note as needed.  Pt is still volume overloaded.  Hypoxic - even with O2 on - was 91% on face mask while doing nebs.  I  thinks he need more diuresis.   Vesta Mixer, Montez Hageman., MD, [facility] 05/17/2013, 1:10 PM

## 2013-05-17 NOTE — Progress Notes (Signed)
[facility] PROGRESS NOTE  Danny [name] MIW:[id number] DOB: [birth year] DOA: 05/15/2013 PCP: Isabella Stalling, MD  Assessment/Plan: 1-Chest pain: atypical. CE'z negative X3; no acute ischemia on EKG.  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 -continue home inhalers/nebulizer  4-GERD: continue PPI  5-HLD: continue statins  6-HTN: overall stable. Continue current regimen  7-paroxysmal A. Fib: will continue amiodarone, BB and is on coumadin.  Code Status: Full Family Communication: wife at bedside Disposition Plan: home when medically stable   Consultants:  Cardiology   Procedures:  Myoview   See below for x-ray reports   Antibiotics:  None   HPI/Subjective: Feeling better. Denies CP; still with some SOB  Objective: Filed Vitals:   05/17/13 1340  BP:   Pulse: 90  Temp:   Resp:     Intake/Output Summary (Last 24 hours) at 05/17/13 1342 Last data filed at 05/17/13 0436  Gross per 24 hour  Intake    360 ml  Output    600 ml  Net   -240 ml   Filed Weights   05/16/13 0301 05/17/13 0431  Weight: 71.079 kg (156 lb 11.2 oz) 71.305 kg (157 lb 3.2 oz)    Exam:   General:  NAD, feeling better; denies CP. Still with some SOB  Cardiovascular: regular rate, no rubs or gallops. 1+ edema bilaterally  Respiratory: decrease BS at the bases, no wheezing  Abdomen: soft, NT, ND, positive BS  Musculoskeletal: no joint swelling or erythemal   Data Reviewed: Basic Metabolic Panel:  Recent Labs Lab 05/15/13 0001 05/16/13 0432  NA 142 142  K 4.1 3.9  CL 95* 95*  CO2 29 32  GLUCOSE 99 153*  BUN 24* 25*  CREATININE 1.27  1.31  CALCIUM 9.8 9.3   Liver Function Tests:  Recent Labs Lab 05/16/13 0432  AST 16  ALT 15  ALKPHOS 61  BILITOT 1.1  PROT 7.3  ALBUMIN 3.4*   CBC:  Recent Labs Lab 05/15/13 0001 05/16/13 0432  WBC 14.9* 15.6*  NEUTROABS  --  12.0*  HGB 13.1 12.6*  HCT 40.8 40.7  MCV 95.8 96.4  PLT 280 298   Cardiac Enzymes:  Recent Labs Lab 05/16/13 0432 05/16/13 0805 05/16/13 0937 05/16/13 1608  TROPONINI <0.30 <0.30 <0.30 <0.30   BNP (last 3 results)  Recent Labs  03/29/13 1220 04/19/13 1332 05/15/13 0001  PROBNP 6129.0* 2727.0* 9941.0*   CBG:  Recent Labs Lab 05/16/13 1058 05/16/13 1705 05/16/13 2115 05/17/13 0610 05/17/13 1237  GLUCAP 115* 239* 209* 187* 255*    Studies: Dg Chest 2 View  05/16/2013   CLINICAL DATA:  Cough.  Chest pain.  EXAM: CHEST  2 VIEW  COMPARISON:  DG CHEST 2 VIEW dated 04/19/2013; DG CHEST 1V PORT dated 03/29/2013; DG CHEST 1V PORT dated 03/17/2013; DG CHEST 1V PORT dated 02/13/2013; DG CHEST 1V PORT dated 10/23/2012; CT ANGIO CHEST W/CM &/OR WO/CM dated 10/23/2012  FINDINGS: Chronic cicatricial changes of the chest are present with multifocal opacity bilaterally. Postsurgical changes with long staples on the left. Monitoring leads project over the chest. There is no definite superimposed airspace disease in this patient with substantial chronic lung disease. Cardiopericardial silhouette unchanged. Paraseptal emphysema noted on prior CT.  IMPRESSION:  Chronic changes of the chest without acute cardiopulmonary disease.   Electronically Signed   By: Andreas NewportGeoffrey  Lamke M.D.   On: 05/16/2013 00:23    Scheduled Meds: . amiodarone  200 mg Oral Daily  . arformoterol  15 mcg Nebulization BID  . budesonide  0.25 mg Nebulization BID  . cloNIDine  0.1 mg Oral TID  . digoxin  0.125 mg Oral q morning - 10a  . furosemide  40 mg Intravenous Q12H  . insulin aspart  0-9 Units Subcutaneous TID WC  . levalbuterol  0.63 mg Nebulization QID  . losartan  100 mg  Oral Daily  . metoprolol  75 mg Oral BID  . pantoprazole  40 mg Oral BID  . potassium chloride  10 mEq Oral QODAY  . prasugrel  10 mg Oral Daily  . [START ON 05/18/2013] predniSONE  30 mg Oral Q breakfast   Followed by  . [START ON 05/20/2013] predniSONE  20 mg Oral Q breakfast   Followed by  . [START ON 05/22/2013] predniSONE  10 mg Oral Q breakfast  . ramipril  2.5 mg Oral Daily  . regadenoson      . rosuvastatin  10 mg Oral q1800  . sodium chloride  3 mL Intravenous Q12H  . Warfarin - Pharmacist Dosing Inpatient   Does not apply q1800    Time spent: <30 minutes   Mathieu Schloemer  [facility] Pager [phone]. If 7PM-7AM, please contact night-coverage at [email or web address], password [facility] 05/17/2013, 1:42 PM  LOS: 2 days

## 2013-05-17 NOTE — Progress Notes (Signed)
ANTICOAGULATION CONSULT NOTE - Follow Up  Pharmacy Consult for Coumadin Indication: RV thrombus  Allergies  Allergen Reactions  . Lipitor [Atorvastatin] Other (See Comments)    Muscle spasms    Patient Measurements: Height: 5\' 7"  (170.2 cm) Weight: 157 lb 3.2 oz (71.305 kg) (scale a) IBW/kg (Calculated) : 66.1  Vital Signs: Temp: 98 F (36.7 C) (02/14 0431) Temp src: Oral (02/14 0431) BP: 98/67 mmHg (02/14 1035) Pulse Rate: 74 (02/14 0431)  Labs:  Recent Labs  05/15/13 0001  05/16/13 0432 05/16/13 0805 05/16/13 0937 05/16/13 1200 05/16/13 1608 05/17/13 0540  HGB 13.1  --  12.6*  --   --   --   --   --   HCT 40.8  --  40.7  --   --   --   --   --   PLT 280  --  298  --   --   --   --   --   LABPROT 23.2*  --   --   --   --  28.4*  --  33.3*  INR 2.14*  --   --   --   --  2.78*  --  3.43*  CREATININE 1.27  --  1.31  --   --   --   --   --   TROPONINI  --   < > <0.30 <0.30 <0.30  --  <0.30  --   < > = values in this interval not displayed. Estimated Creatinine Clearance: 58.9 ml/min (by C-G formula based on Cr of 1.31).  Medical History: Past Medical History  Diagnosis Date  . GERD (gastroesophageal reflux disease)   . COPD (chronic obstructive pulmonary disease)   . Chronic combined systolic and diastolic CHF (congestive heart failure)   . Essential hypertension, benign   . Cardiomyopathy     a. previous LVEF 40-45%;  b. 03/2013 Echo: EF 50-55%, Gr 1 DD, sev dil RV/RA with sev RV dysfxn, PASP . No evidence of RV thrombus.  . Right ventricular mural thrombus     a. On Coumadin  . Cor pulmonale     a. Severe RV dysfunction;  b. 04/2013 R heart cath: RA 17, RV 70/18 (22), PA 73/52.  . Coronary atherosclerosis of native coronary artery     a. 05/2012 MI/PCI: DES to OM [facility];  b. 04/2013 Cath/PCI: LM nl, LAD nonobs, LCX patent mid stent, RCA dom, 47m (2.5x16 Promus DES).  Marland Kitchen PAF (paroxysmal atrial fibrillation)   . Spontaneous pneumothorax  2002    Bilateral  . PAT (paroxysmal atrial tachycardia)     a. on amio/bb.  . High cholesterol   . Myocardial infarction 2014  . Pneumonia     "several times" (05/16/2013)  . Emphysema of lung   . On home oxygen therapy     "4L; 24/7" (05/16/2013)  . Shortness of breath     "all the time" (05/16/2013)  . Type II diabetes mellitus dx'd 05/13/2013  . Chronic kidney disease     "early stages" (05/16/2013)   Medications:  Prescriptions prior to admission  Medication Sig Dispense Refill  . acetaminophen (TYLENOL) 500 MG tablet Take 1,000 mg by mouth every 6 (six) hours as needed for moderate pain.       Marland Kitchen albuterol (PROVENTIL HFA;VENTOLIN HFA) 108 (90 BASE) MCG/ACT inhaler Inhale 2 puffs into the lungs every 6 (six) hours as needed for wheezing or shortness of breath.       Marland Kitchen  amiodarone (PACERONE) 200 MG tablet Take 1 tablet (200 mg total) by mouth daily.  30 tablet  6  . antiseptic oral rinse (BIOTENE) LIQD 15 mLs by Mouth Rinse route 4 (four) times daily as needed for dry mouth.      Marland Kitchen arformoterol (BROVANA) 15 MCG/2ML NEBU Take 15 mcg by nebulization 2 (two) times daily.      . budesonide (PULMICORT) 0.25 MG/2ML nebulizer solution Take 0.25 mg by nebulization 2 (two) times daily.      . carvedilol (COREG) 25 MG tablet Take 25 mg by mouth 2 (two) times daily with a meal.       . cloNIDine (CATAPRES) 0.1 MG tablet Take 0.1 mg by mouth 3 (three) times daily.      . clotrimazole (MYCELEX) 10 MG troche Take 10 mg by mouth daily.       . digoxin (LANOXIN) 0.125 MG tablet Take 0.125 mg by mouth every morning.      . furosemide (LASIX) 40 MG tablet Take 40-80 mg by mouth daily. *takes an extra 40mg  tablet if needed for swelling      . levalbuterol (XOPENEX) 1.25 MG/0.5ML nebulizer solution Take 1.25 mg by nebulization every 6 (six) hours.  1 each  12  . losartan (COZAAR) 100 MG tablet Take 100 mg by mouth every morning.       . metoprolol (LOPRESSOR) 50 MG tablet Take 1.5 tablets (75 mg total) by  mouth 2 (two) times daily.  90 tablet  6  . nitroGLYCERIN (NITROSTAT) 0.4 MG SL tablet Place 1 tablet (0.4 mg total) under the tongue every 5 (five) minutes x 3 doses as needed for chest pain.  25 tablet  3  . omeprazole (PRILOSEC) 20 MG capsule Take 20 mg by mouth 2 (two) times daily.       . potassium chloride (K-DUR) 10 MEQ tablet Take 10 mEq by mouth every other day.      . prasugrel (EFFIENT) 10 MG TABS tablet Take 1 tablet (10 mg total) by mouth daily.  30 tablet  6  . predniSONE (DELTASONE) 10 MG tablet Take 10 mg by mouth See admin instructions. 40mg  a day for 2 days, then 30mg  a day for 2 days, then 20mg  a day for 2 days, then stop at 10mg  a day.wife states she has one more week left as of 05-15-13      . predniSONE (DELTASONE) 5 MG tablet Take 1 tablet (5 mg total) by mouth daily with breakfast.  90 tablet  1  . ramipril (ALTACE) 2.5 MG capsule Take 2.5 mg by mouth daily.       . rosuvastatin (CRESTOR) 10 MG tablet Take 10 mg by mouth every evening.      . sodium chloride (OCEAN) 0.65 % nasal spray Place 1 spray into the nose every 6 (six) hours as needed for congestion.      Marland Kitchen warfarin (COUMADIN) 2.5 MG tablet Take 2.5-3.75 mg by mouth daily. Take 2.5 mg tablet alternating with 3.75 mg  tablets every other day       Scheduled:  . amiodarone  200 mg Oral Daily  . arformoterol  15 mcg Nebulization BID  . budesonide  0.25 mg Nebulization BID  . cloNIDine  0.1 mg Oral TID  . digoxin  0.125 mg Oral q morning - 10a  . furosemide  40 mg Intravenous Q12H  . insulin aspart  0-9 Units Subcutaneous TID WC  . levalbuterol  0.63 mg Nebulization QID  .  losartan  100 mg Oral Daily  . metoprolol  75 mg Oral BID  . pantoprazole  40 mg Oral BID  . potassium chloride  10 mEq Oral QODAY  . prasugrel  10 mg Oral Daily  . [START ON 05/18/2013] predniSONE  30 mg Oral Q breakfast   Followed by  . [START ON 05/20/2013] predniSONE  20 mg Oral Q breakfast   Followed by  . [START ON 05/22/2013] predniSONE   10 mg Oral Q breakfast  . ramipril  2.5 mg Oral Daily  . regadenoson      . rosuvastatin  10 mg Oral q1800  . sodium chloride  3 mL Intravenous Q12H  . warfarin  2.5 mg Oral q1800  . Warfarin - Pharmacist Dosing Inpatient   Does not apply q1800   Assessment: 58yo male c/o rib/chest pain, admitted for CHF exacerbation, to continue Coumadin for RV thrombus.  His INR is just above desired goal range today at 3.43 after resuming his home regimen last evening.  No noted bleeding complications and no labs today.  Goal of Therapy:  INR 2-3   Plan:  Will hold Coumadin today F/U his PT/INR in AM  Nadara MustardNita Little Bashore, PharmD., MS Clinical Pharmacist Pager:  [phone] Thank you for allowing pharmacy to be part of this patients care team. 05/17/2013,12:38 PM

## 2013-05-17 NOTE — Progress Notes (Signed)
Patient placed on continuous pulse ox to better monitor O2 sats throughout the night. Patient maintained SpO2 greater than 92% on 5.5L nasal cannula, occasionally dropping to high 80's when talking and with minimal exertion.  Patient currently resting comfortably, will continue to monitor. Troy Sine

## 2013-05-18 DIAGNOSIS — I219 Acute myocardial infarction, unspecified: Secondary | ICD-10-CM

## 2013-05-18 DIAGNOSIS — I2510 Atherosclerotic heart disease of native coronary artery without angina pectoris: Secondary | ICD-10-CM

## 2013-05-18 DIAGNOSIS — I471 Supraventricular tachycardia: Secondary | ICD-10-CM

## 2013-05-18 DIAGNOSIS — I2789 Other specified pulmonary heart diseases: Secondary | ICD-10-CM

## 2013-05-18 LAB — GLUCOSE, CAPILLARY
Glucose-Capillary: 181 mg/dL — ABNORMAL HIGH (ref 70–99)
Glucose-Capillary: 142 mg/dL — ABNORMAL HIGH (ref 70–99)

## 2013-05-18 LAB — PROTIME-INR
INR: 3.28 — ABNORMAL HIGH (ref 0.00–1.49)
Prothrombin Time: 32.2 s — ABNORMAL HIGH (ref 11.6–15.2)

## 2013-05-18 MED ORDER — PREDNISONE 10 MG PO TABS: 10.0000 mg | ORAL_TABLET | ORAL | Status: DC

## 2013-05-18 MED ORDER — FUROSEMIDE 40 MG PO TABS: 40.0000 mg | ORAL_TABLET | Freq: Two times a day (BID) | ORAL | Status: DC

## 2013-05-18 MED ORDER — CLONIDINE HCL 0.1 MG PO TABS: 0.1000 mg | ORAL_TABLET | Freq: Two times a day (BID) | ORAL | Status: AC

## 2013-05-18 NOTE — Progress Notes (Signed)
Subjective: 58 yo with hx of CAD - s/p PCI to RCA and OM.    Still mildly SOB. Denies CP.   O2 sats are 91% on face mask while getting nebs.   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  Lexiscan myoview yesterday showed no evidence of ischemia.  Normal LV function with EF of 79%.   Objective: Vital signs in last 24 hours: Temp:  [97.4 F (36.3 C)-98 F (36.7 C)] 98 F (36.7 C) (02/15 0452) Pulse Rate:  [73-94] 75 (02/15 0609) Resp:  [18] 18 (02/15 0452) BP: (89-110)/(52-74) 95/56 mmHg (02/15 0609) SpO2:  [84 %-94 %] 93 % (02/15 0823) FiO2 (%):  [40 %-55 %] 40 % (02/15 0823) Weight:  [157 lb 3.2 oz (71.305 kg)] 157 lb 3.2 oz (71.305 kg) (02/15 0452) Last BM Date: 05/17/13  Intake/Output from previous day: 02/14 0701 - 02/15 0700 In: 723 [P.O.:720; I.V.:3] Out: 1375 [Urine:1375] Intake/Output this shift:    Medications Current Facility-Administered Medications  Medication Dose Route Frequency Provider Last Rate Last Dose  . acetaminophen (TYLENOL) tablet 650 mg  650 mg Oral Q6H PRN Eduard Clos, MD   650 mg at 05/17/13 1555   Or  . acetaminophen (TYLENOL) suppository 650 mg  650 mg Rectal Q6H PRN Eduard Clos, MD      . albuterol (PROVENTIL) (2.5 MG/3ML) 0.083% nebulizer solution 2.5 mg  2.5 mg Inhalation Q6H PRN Eduard Clos, MD   2.5 mg at 05/18/13 0254  . amiodarone (PACERONE) tablet 200 mg  200 mg Oral Daily Eduard Clos, MD   200 mg at 05/17/13 1336  . antiseptic oral rinse (BIOTENE) solution 15 mL  15 mL Mouth Rinse QID PRN Eduard Clos, MD      . arformoterol [facility]) nebulizer solution 15 mcg  15 mcg Nebulization BID Eduard Clos, MD   15 mcg at 05/18/13 [phone]  . budesonide (PULMICORT) nebulizer solution 0.25 mg  0.25 mg Nebulization BID Eduard Clos, MD   0.25 mg at 05/18/13 0745  . cloNIDine (CATAPRES) tablet 0.1 mg  0.1 mg Oral TID Eduard Clos, MD   0.1 mg at 05/17/13 2122  . digoxin (LANOXIN)  tablet 0.125 mg  0.125 mg Oral q morning - 10a Eduard Clos, MD   0.125 mg at 05/17/13 1340  . furosemide (LASIX) injection 40 mg  40 mg Intravenous Q12H Eduard Clos, MD   40 mg at 05/18/13 3818  . insulin aspart (novoLOG) injection 0-9 Units  0-9 Units Subcutaneous TID WC Eduard Clos, MD   2 Units at 05/18/13 [phone]  . levalbuterol (XOPENEX) nebulizer solution 0.63 mg  0.63 mg Nebulization QID Eduard Clos, MD   0.63 mg at 05/18/13 0745  . losartan (COZAAR) tablet 100 mg  100 mg Oral Daily Eduard Clos, MD   100 mg at 05/17/13 1341  . metoprolol tartrate (LOPRESSOR) tablet 75 mg  75 mg Oral BID Eduard Clos, MD   75 mg at 05/17/13 2121  . morphine 2 MG/ML injection 2 mg  2 mg Intravenous Q2H PRN Eduard Clos, MD   2 mg at 05/16/13 0806  . nitroGLYCERIN (NITROSTAT) SL tablet 0.4 mg  0.4 mg Sublingual Q5 Min x 3 PRN Eduard Clos, MD      . ondansetron [facility]) tablet 4 mg  4 mg Oral Q6H PRN Eduard Clos, MD       Or  . ondansetron [facility])  injection 4 mg  4 mg Intravenous Q6H PRN Eduard Clos, MD      . pantoprazole (PROTONIX) EC tablet 40 mg  40 mg Oral BID Vassie Loll, MD   40 mg at 05/17/13 2120  . potassium chloride (K-DUR) CR tablet 10 mEq  10 mEq Oral QODAY Eduard Clos, MD   10 mEq at 05/16/13 1010  . prasugrel (EFFIENT) tablet 10 mg  10 mg Oral Daily Eduard Clos, MD   10 mg at 05/17/13 1343  . predniSONE (DELTASONE) tablet 30 mg  30 mg Oral Q breakfast Eduard Clos, MD   30 mg at 05/18/13 0981   Followed by  . [START ON 05/20/2013] predniSONE (DELTASONE) tablet 20 mg  20 mg Oral Q breakfast Eduard Clos, MD       Followed by  . [START ON 05/22/2013] predniSONE (DELTASONE) tablet 10 mg  10 mg Oral Q breakfast Eduard Clos, MD      . ramipril (ALTACE) capsule 2.5 mg  2.5 mg Oral Daily Eduard Clos, MD   2.5 mg at 05/17/13 1338  . rosuvastatin (CRESTOR) tablet 10 mg  10 mg Oral  q1800 Eduard Clos, MD   10 mg at 05/17/13 1802  . sodium chloride (OCEAN) 0.65 % nasal spray 1 spray  1 spray Each Nare Q6H PRN Eduard Clos, MD      . sodium chloride 0.9 % injection 3 mL  3 mL Intravenous Q12H Eduard Clos, MD   3 mL at 05/17/13 2122  . Warfarin - Pharmacist Dosing Inpatient   Does not apply q1800 Colleen Can, [facility]        PE: General appearance: alert, cooperative and no distress Lungs: decreased breath sounds Heart: regular rate and rhythm Extremities: 1+ bilateral LEE Pulses: 2+ and symmetric Skin: warm and dry Neurologic: Grossly normal  Lab Results:   Recent Labs  05/16/13 0432  WBC 15.6*  HGB 12.6*  HCT 40.7  PLT 298   BMET  Recent Labs  05/16/13 0432  NA 142  K 3.9  CL 95*  CO2 32  GLUCOSE 153*  BUN 25*  CREATININE 1.31  CALCIUM 9.3   PT/INR  Recent Labs  05/16/13 1200 05/17/13 0540 05/18/13 0350  LABPROT 28.4* 33.3* 32.2*  INR 2.78* 3.43* 3.28*   Cardiac Panel (last 3 results)  Recent Labs  05/16/13 0805 05/16/13 0937 05/16/13 1608  TROPONINI <0.30 <0.30 <0.30   Assessment/Plan    Principal Problem:   CHF (congestive heart failure) Active Problems:   Coronary atherosclerosis of native coronary artery   RV (right ventricular) mural thrombus   COPD (chronic obstructive pulmonary disease)   Pulmonary hypertension   Chronic respiratory failure   Acute on chronic diastolic heart failure  Plan:    LOS: 3 days    Attending Note:   1. Dyspnea / COPD:  myoview shows no ischemia.   i suspect most of his dyspnea is due to his severe COPD / bullous emphysemia.Marland Kitchen   He may have had some degree of diastolic dysfunction and he has diuresed well. No rales in lungs  He remains Hypoxic - even with O2 on - was 91% on face mask while doing nebs.   This may be his baseline  2. CAD ; myoview was negative for ischemia.   troponins remain negative.  3. Paroxysmal atrial fib:  Remains in NSR.  Continue  coumadin.   Will sign off.  Call for questions.  Vesta MixerPhilip J. Landon Bassford, Montez HagemanJr., MD, [facility] DivisionFACC 05/18/2013, 9:16 AM

## 2013-05-18 NOTE — Progress Notes (Addendum)
ANTICOAGULATION CONSULT NOTE - Follow Up  Pharmacy Consult for Coumadin Indication: RV thrombus  Allergies  Allergen Reactions  . Lipitor [Atorvastatin] Other (See Comments)    Muscle spasms    Patient Measurements: Height: 5\' 7"  (170.2 cm) Weight: 157 lb 3.2 oz (71.305 kg) (scale a) IBW/kg (Calculated) : 66.1  Vital Signs: Temp: 98 F (36.7 C) (02/15 0452) Temp src: Oral (02/15 0452) BP: 95/56 mmHg (02/15 0609) Pulse Rate: 75 (02/15 0609)  Labs:  Recent Labs  05/16/13 0432 05/16/13 0805 05/16/13 0937 05/16/13 1200 05/16/13 1608 05/17/13 0540 05/18/13 0350  HGB 12.6*  --   --   --   --   --   --   HCT 40.7  --   --   --   --   --   --   PLT 298  --   --   --   --   --   --   LABPROT  --   --   --  28.4*  --  33.3* 32.2*  INR  --   --   --  2.78*  --  3.43* 3.28*  CREATININE 1.31  --   --   --   --   --   --   TROPONINI <0.30 <0.30 <0.30  --  <0.30  --   --    Estimated Creatinine Clearance: 58.9 ml/min (by C-G formula based on Cr of 1.31).  Medical History: Past Medical History  Diagnosis Date  . GERD (gastroesophageal reflux disease)   . COPD (chronic obstructive pulmonary disease)   . Chronic combined systolic and diastolic CHF (congestive heart failure)   . Essential hypertension, benign   . Cardiomyopathy     a. previous LVEF 40-45%;  b. 03/2013 Echo: EF 50-55%, Gr 1 DD, sev dil RV/RA with sev RV dysfxn, PASP . No evidence of RV thrombus.  . Right ventricular mural thrombus     a. On Coumadin  . Cor pulmonale     a. Severe RV dysfunction;  b. 04/2013 R heart cath: RA 17, RV 70/18 (22), PA 73/52.  . Coronary atherosclerosis of native coronary artery     a. 05/2012 MI/PCI: DES to [facility];  b. 04/2013 Cath/PCI: LM nl, LAD nonobs, LCX patent mid stent, RCA dom, 108m (2.5x16 Promus DES).  Marland Kitchen PAF (paroxysmal atrial fibrillation)   . Spontaneous pneumothorax 2002    Bilateral  . PAT (paroxysmal atrial tachycardia)     a. on amio/bb.  .  High cholesterol   . Myocardial infarction 2014  . Pneumonia     "several times" (05/16/2013)  . Emphysema of lung   . On home oxygen therapy     "4L; 24/7" (05/16/2013)  . Shortness of breath     "all the time" (05/16/2013)  . Type II diabetes mellitus dx'd 05/13/2013  . Chronic kidney disease     "early stages" (05/16/2013)   Medications:  Prescriptions prior to admission  Medication Sig Dispense Refill  . acetaminophen (TYLENOL) 500 MG tablet Take 1,000 mg by mouth every 6 (six) hours as needed for moderate pain.       Marland Kitchen albuterol (PROVENTIL HFA;VENTOLIN HFA) 108 (90 BASE) MCG/ACT inhaler Inhale 2 puffs into the lungs every 6 (six) hours as needed for wheezing or shortness of breath.       Marland Kitchen amiodarone (PACERONE) 200 MG tablet Take 1 tablet (200 mg total) by mouth daily.  30 tablet  6  . antiseptic  oral rinse (BIOTENE) LIQD 15 mLs by Mouth Rinse route 4 (four) times daily as needed for dry mouth.      Marland Kitchen arformoterol (BROVANA) 15 MCG/2ML NEBU Take 15 mcg by nebulization 2 (two) times daily.      . budesonide (PULMICORT) 0.25 MG/2ML nebulizer solution Take 0.25 mg by nebulization 2 (two) times daily.      . carvedilol (COREG) 25 MG tablet Take 25 mg by mouth 2 (two) times daily with a meal.       . cloNIDine (CATAPRES) 0.1 MG tablet Take 0.1 mg by mouth 3 (three) times daily.      . clotrimazole (MYCELEX) 10 MG troche Take 10 mg by mouth daily.       . digoxin (LANOXIN) 0.125 MG tablet Take 0.125 mg by mouth every morning.      . furosemide (LASIX) 40 MG tablet Take 40-80 mg by mouth daily. *takes an extra 40mg  tablet if needed for swelling      . levalbuterol (XOPENEX) 1.25 MG/0.5ML nebulizer solution Take 1.25 mg by nebulization every 6 (six) hours.  1 each  12  . losartan (COZAAR) 100 MG tablet Take 100 mg by mouth every morning.       . metoprolol (LOPRESSOR) 50 MG tablet Take 1.5 tablets (75 mg total) by mouth 2 (two) times daily.  90 tablet  6  . nitroGLYCERIN (NITROSTAT) 0.4 MG SL  tablet Place 1 tablet (0.4 mg total) under the tongue every 5 (five) minutes x 3 doses as needed for chest pain.  25 tablet  3  . omeprazole (PRILOSEC) 20 MG capsule Take 20 mg by mouth 2 (two) times daily.       . potassium chloride (K-DUR) 10 MEQ tablet Take 10 mEq by mouth every other day.      . prasugrel (EFFIENT) 10 MG TABS tablet Take 1 tablet (10 mg total) by mouth daily.  30 tablet  6  . predniSONE (DELTASONE) 10 MG tablet Take 10 mg by mouth See admin instructions. 40mg  a day for 2 days, then 30mg  a day for 2 days, then 20mg  a day for 2 days, then stop at 10mg  a day.wife states she has one more week left as of 05-15-13      . predniSONE (DELTASONE) 5 MG tablet Take 1 tablet (5 mg total) by mouth daily with breakfast.  90 tablet  1  . ramipril (ALTACE) 2.5 MG capsule Take 2.5 mg by mouth daily.       . rosuvastatin (CRESTOR) 10 MG tablet Take 10 mg by mouth every evening.      . sodium chloride (OCEAN) 0.65 % nasal spray Place 1 spray into the nose every 6 (six) hours as needed for congestion.      Marland Kitchen warfarin (COUMADIN) 2.5 MG tablet Take 2.5-3.75 mg by mouth daily. Take 2.5 mg tablet alternating with 3.75 mg  tablets every other day       Assessment: 58yo male c/o rib/chest pain, admitted for CHF exacerbation.  He has been on Warfarin therapy for an RV thrombus.  His INR is still slightly above desired goal range today at 3.28 after holding his dose yesterday.  He requires small doses to maintain a therapeutic response.  He is on amiodarone which can increase INR and may be the reason behind lower dose requirements.  No noted bleeding complications and no new CBC today.    HOME Regimen:  Takes 2.5 mg alternating with 3.75 mg every other day.  Drug/Drug Interactions:  Anticoagulants / Amiodarone & Anticoagulants / Prasugrel Amiodarone may inhibit hepatic metabolism and increase the anticoagulant effect of warfarin.  Coadministration of warfarin and prasugrel may increase the risk of  bleeding.  MEDICATION Review: Hypertensive Meds: (Clonidine, Furosemide, Losartan, Metoprolol, Ramipril) - Somewhat hypotensive this morning - would he benefit from dose reduction of Clonidine (0.1 mg bid) or holding it for now?  Goal of Therapy:  INR 2-3   Plan:  Will hold Coumadin one more day to let it fall < 3.0 F/U his PT/INR in AM  Nadara MustardNita Latesa Fratto, PharmD., MS Clinical Pharmacist Pager:  [phone] Thank you for allowing pharmacy to be part of this patients care team. 05/18/2013,7:34 AM

## 2013-05-18 NOTE — Discharge Summary (Signed)
Physician Discharge Summary  Danny [name] FQM:[id number] DOB: 06-20-1955 DOA: 05/15/2013  PCP: Isabella Stalling, MD  Admit date: 05/15/2013 Discharge date: 05/18/2013  Time spent: >30 minutes  Recommendations for Outpatient Follow-up:  1. BMET to follow renal function and electrolytes  BNP    Component Value Date/Time   PROBNP 9941.0* 05/15/2013 0001   Filed Weights   05/16/13 0301 05/17/13 0431 05/18/13 0452  Weight: 71.079 kg (156 lb 11.2 oz) 71.305 kg (157 lb 3.2 oz) 71.305 kg (157 lb 3.2 oz)     Discharge Diagnoses:  Principal Problem:   CHF (congestive heart failure) Active Problems:   Coronary atherosclerosis of native coronary artery   RV (right ventricular) mural thrombus   COPD (chronic obstructive pulmonary disease)   Pulmonary hypertension   Chronic respiratory failure   Acute on chronic diastolic heart failure   Discharge Condition: stable and improved; will discharge home   Diet recommendation: low sodium diet   History of present illness:  58 y.o. male with known history of CAD status post recent stenting for unstable angina, severe COPD on home oxygen with cor pulmonale, probably thrombus on Coumadin, paroxysmal A. fib fibrillation, MAT, hyperglycemia presented to the ER because of increasing shortness of breath over last 3 days. Patient has been having increasing shortness of breath on exertion with edema of the lower extremities. Patient has had recent cardiac catheter and stent placed in the right RCA drug-eluting stent. Patient following which has had a COPD exacerbation and presently is on tapering dose of prednisone. Patient usually takes otherwise regularly 5 mg of prednisone daily. Patient also had recently gone to [facility] for lung [facility] and was told that he may not be a candidate as per the patient. In addition patient has been having nonspecific chest pain retrosternal sometimes increased with breathing sometimes otherwise unrelated although  last few days. In the ER EKG and cardiac markers were negative chest x-ray was unremarkable. Labs reveal elevated BNP and on exam patient clinically looked more like a CHF exacerbation and was given Lasix 40 mg IV and admitted for further workup. Patient otherwise denies any nausea vomiting abdominal pain diarrhea. Denies any productive cough or fever chills.    Hospital Course:  1-Chest pain: atypical. CE'z negative X3; no acute ischemia on EKG.  -myoview negative  -pain not cardiac in nature  -continue effient, BB and statins  -no CP at discharge  2-acute on chronic diastolic heart failure:  -continue lasix PO 40 BID -breathing is better and pretty much back to baseline -patient will follow with cardiology as instructed -continue cardio-pulmonary rehab  3-chronic resp failure due to COPD/bullous emphysema:  -continue chronic steroids (currently tapering it due to recent exacerbation)  -continue oxygen supplementation.  -continue home inhalers/nebulizer  -no wheezing -follow up with pulmonologist as instructed/scheduled prior to admission.  4-GERD: continue PPI   5-HLD: continue statins   6-HTN: overall stable. Continue current regimen   7-paroxysmal A. Fib: will continue amiodarone, BB and is on coumadin.   Procedures:  Myoview (negative for acute ischemia)  See below for x-ray reports   Consultations:  Cardiology  Discharge Exam: Filed Vitals:   05/18/13 0931  BP: 110/69  Pulse: 77  Temp: 97.8 F (36.6 C)  Resp: 22   General: NAD, feeling better; denies CP. Still with mild SOB; but reports is pretty much baseline for him.  Cardiovascular: regular rate, no rubs or gallops. No Le edema Respiratory: improve air movement; patient still feeling mild SOB; no crackles  and no wheezing Abdomen: soft, NT, ND, positive BS  Musculoskeletal: no joint swelling or erythemal    Discharge Instructions  Discharge Orders   Future Appointments Provider Department Dept  Phone   05/19/2013 11:00 AM [facility] [place] [facility] [phone]   05/21/2013 11:00 AM [facility] [place] [facility] [phone]   05/22/2013 10:00 AM Leslye Peerobert S Byrum, MD [facility] [phone]   05/23/2013 11:00 AM [facility] [place] [facility] [phone]   05/26/2013 10:40 AM [facility] [facility] [place] [phone]   05/26/2013 11:00 AM [facility] [place] [facility] [phone]   05/28/2013 11:00 AM [facility] [place] [facility] [phone]   05/30/2013 11:00 AM [facility] 3 [place] [facility] [phone]   06/02/2013 11:00 AM [facility] 3 [place] [facility] [phone]   06/04/2013 11:00 AM [facility] [place] [facility] [phone]   06/06/2013 11:00 AM [facility] 3 [place] [facility] [phone]   06/09/2013 11:00 AM [facility] 3 [place] [facility] [phone]   06/11/2013 11:00 AM [facility] 3 [place] [facility] [phone]   06/13/2013 11:00 AM [facility] 3 [place] [facility] [phone]   06/16/2013 11:00 AM [facility] 3 [place] [facility] [phone]   06/18/2013 11:00 AM [facility] 3 [place] [facility] [phone]   06/19/2013 1:20 PM Jonelle SidleSamuel G McDowell, MD [facility] [place] [phone]   06/20/2013 11:00 AM [facility] 3 [place] [facility] [phone]   06/23/2013 11:00 AM [facility] 3 [place] [facility] [phone]   06/25/2013 11:00 AM [facility] 3 [place] [facility] [phone]   06/27/2013 11:00 AM [facility] 3 [place] [facility] [phone]   06/30/2013 11:00 AM [facility] 3 [place] [facility] [phone]   07/02/2013 11:00 AM [facility]  Monitor 3 [place] [facility] [phone]   07/04/2013 11:00 AM [facility] 3 [place] [facility] [phone]   07/07/2013 11:00 AM [facility] 3  [facility] [phone]   07/09/2013 11:00 AM [facility] 3  [facility] [phone]   07/11/2013 11:00 AM [facility] 3  [facility] [phone]   Future Orders Complete By Expires   Diet - low sodium heart healthy  As directed    Discharge instructions  As directed    Comments:     Take medications as prescribed Follow a low sodium diet (< 2 grams daily) Please use flutter valve/IS as discussed Arrange follow up with PCP in 1 week. Follow with cardiology service as instructed (office will call with appointment details)       Medication List    STOP taking these medications       carvedilol 25 MG tablet  Commonly known as:  COREG      TAKE these medications       acetaminophen 500 MG tablet  Commonly known as:  TYLENOL  Take 1,000 mg by mouth every 6 (six) hours as needed for moderate pain.     albuterol 108 (90 BASE) MCG/ACT inhaler  Commonly known as:  PROVENTIL HFA;VENTOLIN HFA  Inhale 2 puffs into the lungs every 6 (six) hours as needed for wheezing or shortness of breath.     amiodarone 200 MG tablet  Commonly known as:  PACERONE  Take 1 tablet (200 mg total) by mouth daily.     antiseptic oral rinse Liqd  15 mLs by Mouth  Rinse route 4 (four) times daily as needed for dry mouth.     arformoterol 15 MCG/2ML Nebu  Commonly known as:  BROVANA  Take 15 mcg by nebulization 2 (two) times daily.     budesonide 0.25 MG/2ML nebulizer solution  Commonly known as:  PULMICORT  Take 0.25 mg by nebulization 2 (two) times daily.     cloNIDine 0.1 MG tablet  Commonly known as:  CATAPRES  Take 1 tablet (0.1 mg total) by mouth 2 (two) times daily.     clotrimazole 10 MG troche  Commonly known as:  MYCELEX  Take 10 mg by  mouth daily.     digoxin 0.125 MG tablet  Commonly known as:  LANOXIN  Take 0.125 mg by mouth every morning.     furosemide 40 MG tablet  Commonly known as:  LASIX  Take 1 tablet (40 mg total) by mouth 2 (two) times daily. *takes an extra 40mg  tablet if needed for swelling     levalbuterol 1.25 MG/0.5ML nebulizer solution  Commonly known as:  XOPENEX  Take 1.25 mg by nebulization every 6 (six) hours.     losartan 100 MG tablet  Commonly known as:  COZAAR  Take 100 mg by mouth every morning.     metoprolol 50 MG tablet  Commonly known as:  LOPRESSOR  Take 1.5 tablets (75 mg total) by mouth 2 (two) times daily.     nitroGLYCERIN 0.4 MG SL tablet  Commonly known as:  NITROSTAT  Place 1 tablet (0.4 mg total) under the tongue every 5 (five) minutes x 3 doses as needed for chest pain.     omeprazole 20 MG capsule  Commonly known as:  PRILOSEC  Take 20 mg by mouth 2 (two) times daily.     potassium chloride 10 MEQ tablet  Commonly known as:  K-DUR  Take 10 mEq by mouth every other day.     prasugrel 10 MG Tabs tablet  Commonly known as:  EFFIENT  Take 1 tablet (10 mg total) by mouth daily.     predniSONE 10 MG tablet  Commonly known as:  DELTASONE  Take 1 tablet (10 mg total) by mouth See admin instructions. 30mg  a day for 2 days, then 20mg  a day for 2 days, then resume your 10mg  a day.     ramipril 2.5 MG capsule  Commonly known as:  ALTACE  Take 2.5 mg by mouth daily.     rosuvastatin 10 MG tablet  Commonly known as:  CRESTOR  Take 10 mg by mouth every evening.     sodium chloride 0.65 % nasal spray  Commonly known as:  OCEAN  Place 1 spray into the nose every 6 (six) hours as needed for congestion.     warfarin 2.5 MG tablet  Commonly known as:  COUMADIN  Take 2.5-3.75 mg by mouth daily. Take 2.5 mg tablet alternating with 3.75 mg  tablets every other day       Allergies  Allergen Reactions  . Lipitor [Atorvastatin] Other (See Comments)    Muscle spasms         Follow-up Information   Follow up with Isabella Stalling, MD. Schedule an appointment as soon as possible for a visit in 1 week.   Specialty:  Internal Medicine   Contact information:   [address] [place] [address] [phone]        The results of significant diagnostics from this hospitalization (including imaging, microbiology, ancillary and laboratory) are listed  below for reference.    Significant Diagnostic Studies: Dg Chest 2 View  05/16/2013   CLINICAL DATA:  Cough.  Chest pain.  EXAM: CHEST  2 VIEW  COMPARISON:  DG CHEST 2 VIEW dated 04/19/2013; DG CHEST 1V PORT dated 03/29/2013; DG CHEST 1V PORT dated 03/17/2013; DG CHEST 1V PORT dated 02/13/2013; DG CHEST 1V PORT dated 10/23/2012; CT ANGIO CHEST W/CM &/OR WO/CM dated 10/23/2012  FINDINGS: Chronic cicatricial changes of the chest are present with multifocal opacity bilaterally. Postsurgical changes with long staples on the left. Monitoring leads project over the chest. There is no definite superimposed airspace disease in this patient with substantial chronic lung disease. Cardiopericardial silhouette unchanged. Paraseptal emphysema noted on prior CT.  IMPRESSION: Chronic changes of the chest without acute cardiopulmonary disease.   Electronically Signed   By: Andreas Newport M.D.   On: 05/16/2013 00:23   Dg Chest 2 View  04/19/2013   CLINICAL DATA:  Dyspnea and weakness with history of COPD, on waiting list for lung transplant.  EXAM: CHEST  2 VIEW  COMPARISON:  Portable chest x-ray March 29, 2013.  FINDINGS: The lungs are borderline hypoinflated. This appears stable. The left hemidiaphragm is higher than the right which is also stable. There are coarse increased lung markings bilaterally with areas of near confluence. These are stable. The cardiac silhouette is top-normal in size. The pulmonary vascularity is not engorged. There is no pleural effusion or pneumothorax. The mediastinum is normal in width. The  observed portions of the bony thorax appear normal.  IMPRESSION: There has not been significant interval change since the study of 29 March 2013. Bilateral pleural and parenchymal scarring is suspected. There is no pneumothorax. An element of low grade CHF may be present.   Electronically Signed   By: David  [place]   On: 04/19/2013 14:13   Nm Myocar Multi W/spect W/wall Motion / Ef  05/17/2013   CLINICAL DATA:  Chest pain, history hypertension, CHF, CAD  EXAM: MYOCARDIAL IMAGING WITH SPECT (REST AND PHARMACOLOGIC-STRESS)  GATED LEFT VENTRICULAR WALL MOTION STUDY  LEFT VENTRICULAR EJECTION FRACTION  TECHNIQUE: Standard myocardial SPECT imaging was performed after resting intravenous injection of 10 mCi Tc-50m sestamibi. Subsequently, intravenous infusion of Lexiscan was performed under the supervision of the Cardiology staff. At peak effect of the drug, 30 mCi Tc-103m sestamibi was injected intravenously and standard myocardial SPECT imaging was performed. Quantitative gated imaging was also performed to evaluate left ventricular wall motion, and estimate left ventricular ejection fraction.  COMPARISON:  DG CHEST 2 VIEW dated 05/15/2013  FINDINGS: Review of the rotational raw images demonstrates mild motion artifact on the provided rest and stress images. There is no significant chest wall or GI attenuation  SPECT imaging is negative for discrete matched or mismatched perfusion defect.  Quantitative gated analysis demonstrates normal wall motion.  The resting left ventricular ejection fraction is 79% with end-diastolic volume of 50 ml and end-systolic volume of 10 ml.  IMPRESSION: 1. No scintigraphic evidence of prior infarction or pharmacologically induced ischemia. 2. Normal wall motion.  Ejection fraction -79%.   Electronically Signed   By: Simonne Come M.D.   On: 05/17/2013 13:54   Labs: Basic Metabolic Panel:  Recent Labs Lab 05/15/13 0001 05/16/13 0432  NA 142 142  K 4.1 3.9  CL 95* 95*  CO2 29  32  GLUCOSE 99 153*  BUN 24* 25*  CREATININE 1.27 1.31  CALCIUM 9.8 9.3   Liver Function Tests:  Recent Labs Lab 05/16/13  0432  AST 16  ALT 15  ALKPHOS 61  BILITOT 1.1  PROT 7.3  ALBUMIN 3.4*   CBC:  Recent Labs Lab 05/15/13 0001 05/16/13 0432  WBC 14.9* 15.6*  NEUTROABS  --  12.0*  HGB 13.1 12.6*  HCT 40.8 40.7  MCV 95.8 96.4  PLT 280 298   Cardiac Enzymes:  Recent Labs Lab 05/16/13 0432 05/16/13 0805 05/16/13 0937 05/16/13 1608  TROPONINI <0.30 <0.30 <0.30 <0.30   BNP: BNP (last 3 results)  Recent Labs  03/29/13 1220 04/19/13 1332 05/15/13 0001  PROBNP 6129.0* 2727.0* 9941.0*   CBG:  Recent Labs Lab 05/17/13 1237 05/17/13 1644 05/17/13 2138 05/18/13 0559 05/18/13 1116  GLUCAP 255* 159* 146* 181* 142*    Signed:  Nekeisha Aure  [facility] 05/18/2013, 11:53 AM

## 2013-05-19 ENCOUNTER — Encounter (HOSPITAL_COMMUNITY)
Admission: RE | Admit: 2013-05-19 | Discharge: 2013-05-19 | Disposition: A | Discharge status: Home / Self Care | Payer: Managed Care, Other (non HMO) | Source: Ambulatory Visit | Attending: Cardiology | Admitting: Cardiology

## 2013-05-21 ENCOUNTER — Encounter (HOSPITAL_COMMUNITY): Payer: Managed Care, Other (non HMO)

## 2013-05-22 ENCOUNTER — Encounter: Payer: Self-pay | Admitting: Emergency Medicine

## 2013-05-22 ENCOUNTER — Ambulatory Visit (INDEPENDENT_AMBULATORY_CARE_PROVIDER_SITE_OTHER): Payer: Managed Care, Other (non HMO) | Admitting: Emergency Medicine

## 2013-05-22 VITALS — BP 130/84 | HR 70 | Ht 69.0 in | Wt 160.0 lb

## 2013-05-22 DIAGNOSIS — J449 Chronic obstructive pulmonary disease, unspecified: Secondary | ICD-10-CM

## 2013-05-22 NOTE — Assessment & Plan Note (Signed)
Please continue your Brovana and Pulmicort We will start Danny [name] twice a day. Use albuterol (ProAir) as needed for shortness of breath Stop xopenex (levalbuterol).  Continue your BiPAP every night Wear your oxygen at all times.  Follow with Dr Delton Coombes in 1 months or sooner if you have any problems.

## 2013-05-22 NOTE — Progress Notes (Signed)
Subjective:    Patient ID: Danny [name], male    DOB: 16-Feb-1956, 58 y.o.   MRN: [id number] HPI 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    01/15/2008--Complains of 2 weeks of cough, congestion, blood tinged initially. Went to ER CXR and CT chest showed 4 R broken ribs, negative for PE. bilateral scarring, and questionable opacities. Started on Avelox for 4 days.(per pt ). Some better but still has coughing fits with thick mucus. and DOE, no energy. Pain in ribs with coughng. finished avelox 2 days. ago.   02/04/08 - returns today for f/u, has completed avelox. CP is better. Breathing is better. Cough and hemoptysis have resolved. Seldom uses ProAir.   ROV 06/01/09 -- Returns for f/u, last seen 2009. His breathing has been stable. Tells me he ran out of Advair about 2 weeks ago, noticed that his SABA use increased some. Has had some anxiety at night about not getting his meds but breathing ok. No exacerbations since last visit. No smoking. No cough or hemoptysis.   ROV 09/05/10 -- Hx COPD, emphysema and s/p bullectomies. Follows up for annual check. No flares since last visit. Uses Advair bid, ProAir prn. He believes he would miss the Advair if stopped. Uses ProAir once every several days. Occas wheze, no real cough. Occas gray sputum. No CP. Needs his meds refilled.   ROV 02/07/11 -- COPD, bullous emphysema. Presents today c/o more congestion, thick sputum, over about 5 months. He has been more active, and wonders if this is a cause. The mucous if thick yellow. Still able to work. Denies much dyspnea. No exacerbations. Uses his SABA a couple times a week.   ROV 10/23/11 -- COPD, bullous emphysema. He tried mucinex since last time. He has had some LE/toe neuropathic changes, ? Some rash on fingers. He stopped the mucinex and the sx are better. Remains on Advair. Uses albuterol rarely.  Coughs but not every day. Some DOE,  happens almost every day.   ROV 04/24/12 -- COPD, bullous emphysema s/p bullectomies. Returns for f/u.  He had a recent URI that caused probable AE, was rx with abx, also started on a trial of Spiriva x 1 week. He was already on Advair. The URI sx are better. Uses SABA typically 1-2 x a day. He is trying to get back to exercising.   ROV 05/10/12 -- COPD, bullous emphysema s/p bullectomies. Was recently evaluated w cardiac cath at [facility], had NSTEMI and cath >> R dominant system, stent placed in the circumflex. LVEDP was normal, RV was dilated. He is improved, was discharged on O2. His Spiriva was stopped and he was changed to DuoNebs during that hospitalization. His Advair was continued.  Feels drained but stable  ROV 06/25/12 -- COPD, bullous emphysema s/p bullectomies. CAD w hx NSTEMI. Doing cardiopulm rehab.  He has seen Dr Eden Emms to establish in [place]. He remains on Advair + Duonebs. He didn't tolerate spiriva. 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 He is compliant with his O2.   ROV 09/26/12 -- COPD, bullous emphysema s/p bullectomies. CAD w hx NSTEMI, secondary PAH. He reports today that he has had more dyspnea, seems to be associated with some epigastric, lower chest fullness or pain. He reports desats with O2 at 3L/min when exercising. He is on prednisone 5, started by Dr Janna Arch,  that he is using prn. He feels that it is helping with his pain.   ROV 02/04/13 -- COPD, bullous emphysema s/p bullectomies. CAD w hx NSTEMI, secondary PAH. He has a CM + RV dysfunction, mural thrombus on coumadin. On presentation today he is hypoxic. He describes increase SOB and cough over the last few months. He denies any acute exacerbations he remains on Advair, duonebs 2-3x a day. He has prednisone that is available to use prn for pain - not currently on any. He wears BiPAP every night. He had a CT scan in 10/23/12 for dyspnea and CP.   02/25/13  Post Hospital follow up  Pt returns for a post hospital follow up .  Reports is doing "much better". Still has some cough and congestion.  Admitted 02/13/2013 for COPD exacerbation. He was started on steroids and breathing treatments and antibiotics.  At discharge , Home O2 increased to 2l/m with rest 4l/m with activity.  Advair was changed to Brovana/pulmicort. Remains on Coreg 25mg  Twice daily   Remains on ACE and ARB . Pt denies chest pain, orthopnea, edema or fever.  Discharged on prednisone taper-has few days left. Wears BIPAP everynight.   ROV 03/21/13 -- COPD, bullous emphysema s/p bullectomies. CAD w hx NSTEMI, secondary PAH. He has been on chronic prednisone 5mg . He is on budesonide + brovana. He was restarted on prednisone, now on 5mg . He has done poorly when it has been tapered to 0.   ROV 05/22/13 -- COPD, bullous emphysema s/p bullectomies. CAD w hx NSTEMI (stented 04/2013), secondary PAH. He was seen 1/28 and treated for an AE, was improved on ROV 2/8 with TP. At the time was on pred 10mg  qd.  He was admitted 05/15/13 for cor pulmonale, diastolic CHF, was diuresed. He does feel some improvement. He was evaluated for transplant but was turned down due his chronic medical issues (new DM, CAD, renal insufficiency). He is on brovana and pulmicort.    Objective:   Physical Exam  Filed Vitals:   05/22/13 1013  BP: 130/84  Pulse: 70  Height: 5\' 9"  (1.753 m)  Weight: 160 lb (72.576 kg)  SpO2: 91%    Gen: Pleasant, in no distress,  normal affect  ENT: no postnasal drip, scattered white patches along posterior pharynx   Neck: No JVD, no TMG, no carotid bruits  Lungs: No use of accessory muscles, clear without rales or rhonchi  Cardiovascular: RRR, heart sounds normal, no murmur or gallops, trace edema  Musculoskeletal: No deformities, no cyanosis or clubbing  Neuro: alert, non focal  Skin: Warm, no lesions or rashes   10/23/12 --  Comparison: Chest radiograph,  10/23/2012. Chest CT, 08/04/2008.  Findings: There is no evidence of a pulmonary embolism.  The heart is enlarged, but stable. There is a short left coronary  artery stents. The no mediastinal or hilar masses are appreciated.  Fascia on the right subcarinal lymph node is mildly enlarged  measuring 14 mm in short axis. There are prominent hilar lymph  nodes bilaterally are not well defined.  There are changes of emphysema and lung scarring. There is a new 8  mm irregular focal opacity in the left upper lobe that may reflect  additional scarring. Neoplastic disease is possible. There is  some ground-glass type opacity in the posterior lateral right lower  lobe which was not present previously. This is somewhat  nonspecific. It could reflect an alveolitis. No other evidence of  infection/inflammation. No other change from the prior CT  within  the lungs.  There are multiple old rib fractures on the right.  Limited evaluation of the upper abdomen is unremarkable.  There are minor degenerative changes of the thoracic spine. No  osteoblastic or osteolytic lesions.  IMPRESSION:  No evidence of a pulmonary embolism.  Significant emphysema and areas of lung scarring.  Mild ground-glass opacity in the right lower lobe, new from the  prior study. Consider an acute inflammatory or infectious  alveolitis in the proper clinical setting.  8 mm focal opacity in the left upper lobe is new from prior study.  02/14/13 CXR Areas of lung scarring and emphysema are stable.    Assessment & Plan:    No problem-specific assessment & plan notes found for this encounter.

## 2013-05-22 NOTE — Telephone Encounter (Signed)
Thanks I saw Danny [name] this week

## 2013-05-22 NOTE — Patient Instructions (Addendum)
Please continue your Brovana and Pulmicort We will start Tudorza twice a day. Use albuterol (ProAir) as needed for shortness of breath Stop xopenex (levalbuterol).  Continue your BiPAP every night Wear your oxygen at all times.  Follow with Dr Dexton Zwilling in 1 months or sooner if you have any problems. 

## 2013-05-23 ENCOUNTER — Encounter (HOSPITAL_COMMUNITY): Payer: Managed Care, Other (non HMO)

## 2013-05-26 ENCOUNTER — Emergency Department (HOSPITAL_COMMUNITY): Payer: Managed Care, Other (non HMO)

## 2013-05-26 ENCOUNTER — Encounter (HOSPITAL_COMMUNITY): Payer: Self-pay | Admitting: Emergency Medicine

## 2013-05-26 ENCOUNTER — Telehealth: Payer: Self-pay | Admitting: Cardiology

## 2013-05-26 ENCOUNTER — Telehealth: Payer: Self-pay | Admitting: Emergency Medicine

## 2013-05-26 ENCOUNTER — Inpatient Hospital Stay (HOSPITAL_COMMUNITY)
Admission: EM | Admit: 2013-05-26 | Discharge: 2013-06-11 | DRG: 190 | Disposition: A | Discharge status: Home Health | Payer: Managed Care, Other (non HMO) | Attending: Family Medicine | Admitting: Family Medicine

## 2013-05-26 ENCOUNTER — Ambulatory Visit: Payer: Managed Care, Other (non HMO) | Admitting: Internal Medicine

## 2013-05-26 ENCOUNTER — Encounter (HOSPITAL_COMMUNITY): Payer: Managed Care, Other (non HMO)

## 2013-05-26 DIAGNOSIS — Z794 Long term (current) use of insulin: Secondary | ICD-10-CM

## 2013-05-26 DIAGNOSIS — N182 Chronic kidney disease, stage 2 (mild): Secondary | ICD-10-CM | POA: Diagnosis present

## 2013-05-26 DIAGNOSIS — I129 Hypertensive chronic kidney disease with stage 1 through stage 4 chronic kidney disease, or unspecified chronic kidney disease: Secondary | ICD-10-CM | POA: Diagnosis present

## 2013-05-26 DIAGNOSIS — I10 Essential (primary) hypertension: Secondary | ICD-10-CM

## 2013-05-26 DIAGNOSIS — J449 Chronic obstructive pulmonary disease, unspecified: Secondary | ICD-10-CM | POA: Diagnosis present

## 2013-05-26 DIAGNOSIS — N289 Disorder of kidney and ureter, unspecified: Secondary | ICD-10-CM | POA: Diagnosis present

## 2013-05-26 DIAGNOSIS — J9610 Chronic respiratory failure, unspecified whether with hypoxia or hypercapnia: Secondary | ICD-10-CM | POA: Diagnosis present

## 2013-05-26 DIAGNOSIS — I2781 Cor pulmonale (chronic): Secondary | ICD-10-CM

## 2013-05-26 DIAGNOSIS — I4891 Unspecified atrial fibrillation: Secondary | ICD-10-CM | POA: Diagnosis present

## 2013-05-26 DIAGNOSIS — J189 Pneumonia, unspecified organism: Secondary | ICD-10-CM | POA: Diagnosis present

## 2013-05-26 DIAGNOSIS — Z87891 Personal history of nicotine dependence: Secondary | ICD-10-CM

## 2013-05-26 DIAGNOSIS — I5042 Chronic combined systolic (congestive) and diastolic (congestive) heart failure: Secondary | ICD-10-CM | POA: Diagnosis present

## 2013-05-26 DIAGNOSIS — IMO0002 Reserved for concepts with insufficient information to code with codable children: Secondary | ICD-10-CM

## 2013-05-26 DIAGNOSIS — E782 Mixed hyperlipidemia: Secondary | ICD-10-CM | POA: Diagnosis present

## 2013-05-26 DIAGNOSIS — I2589 Other forms of chronic ischemic heart disease: Secondary | ICD-10-CM | POA: Diagnosis present

## 2013-05-26 DIAGNOSIS — I429 Cardiomyopathy, unspecified: Secondary | ICD-10-CM | POA: Diagnosis present

## 2013-05-26 DIAGNOSIS — R0902 Hypoxemia: Secondary | ICD-10-CM

## 2013-05-26 DIAGNOSIS — Z9861 Coronary angioplasty status: Secondary | ICD-10-CM

## 2013-05-26 DIAGNOSIS — Z7901 Long term (current) use of anticoagulants: Secondary | ICD-10-CM

## 2013-05-26 DIAGNOSIS — I2789 Other specified pulmonary heart diseases: Secondary | ICD-10-CM | POA: Diagnosis present

## 2013-05-26 DIAGNOSIS — J9620 Acute and chronic respiratory failure, unspecified whether with hypoxia or hypercapnia: Secondary | ICD-10-CM | POA: Diagnosis present

## 2013-05-26 DIAGNOSIS — I252 Old myocardial infarction: Secondary | ICD-10-CM

## 2013-05-26 DIAGNOSIS — Z92241 Personal history of systemic steroid therapy: Secondary | ICD-10-CM

## 2013-05-26 DIAGNOSIS — J441 Chronic obstructive pulmonary disease with (acute) exacerbation: Principal | ICD-10-CM | POA: Diagnosis present

## 2013-05-26 DIAGNOSIS — I272 Pulmonary hypertension, unspecified: Secondary | ICD-10-CM | POA: Diagnosis present

## 2013-05-26 DIAGNOSIS — K219 Gastro-esophageal reflux disease without esophagitis: Secondary | ICD-10-CM

## 2013-05-26 DIAGNOSIS — J9621 Acute and chronic respiratory failure with hypoxia: Secondary | ICD-10-CM | POA: Diagnosis present

## 2013-05-26 DIAGNOSIS — I513 Intracardiac thrombosis, not elsewhere classified: Secondary | ICD-10-CM | POA: Diagnosis present

## 2013-05-26 DIAGNOSIS — E7800 Pure hypercholesterolemia, unspecified: Secondary | ICD-10-CM | POA: Diagnosis present

## 2013-05-26 DIAGNOSIS — I509 Heart failure, unspecified: Secondary | ICD-10-CM

## 2013-05-26 DIAGNOSIS — I471 Supraventricular tachycardia, unspecified: Secondary | ICD-10-CM | POA: Diagnosis present

## 2013-05-26 DIAGNOSIS — I219 Acute myocardial infarction, unspecified: Secondary | ICD-10-CM

## 2013-05-26 DIAGNOSIS — E872 Acidosis, unspecified: Secondary | ICD-10-CM | POA: Diagnosis present

## 2013-05-26 DIAGNOSIS — I4719 Other supraventricular tachycardia: Secondary | ICD-10-CM | POA: Diagnosis present

## 2013-05-26 DIAGNOSIS — I5189 Other ill-defined heart diseases: Secondary | ICD-10-CM | POA: Diagnosis present

## 2013-05-26 DIAGNOSIS — Z8701 Personal history of pneumonia (recurrent): Secondary | ICD-10-CM

## 2013-05-26 DIAGNOSIS — I2510 Atherosclerotic heart disease of native coronary artery without angina pectoris: Secondary | ICD-10-CM | POA: Diagnosis present

## 2013-05-26 DIAGNOSIS — E119 Type 2 diabetes mellitus without complications: Secondary | ICD-10-CM | POA: Diagnosis present

## 2013-05-26 DIAGNOSIS — Z9981 Dependence on supplemental oxygen: Secondary | ICD-10-CM

## 2013-05-26 HISTORY — DX: Chronic kidney disease, stage 2 (mild): N18.2

## 2013-05-26 HISTORY — DX: Mixed hyperlipidemia: E78.2

## 2013-05-26 HISTORY — DX: Personal history of pneumonia (recurrent): Z87.01

## 2013-05-26 LAB — CBC WITH DIFFERENTIAL/PLATELET
Basophils Absolute: 0 10*3/uL (ref 0.0–0.1)
Basophils Relative: 0 % (ref 0–1)
Eosinophils Absolute: 0 10*3/uL (ref 0.0–0.7)
Eosinophils Relative: 0 % (ref 0–5)
HCT: 39.6 % (ref 39.0–52.0)
HEMOGLOBIN: 12.3 g/dL — AB (ref 13.0–17.0)
LYMPHS ABS: 0.9 10*3/uL (ref 0.7–4.0)
Lymphocytes Relative: 9 % — ABNORMAL LOW (ref 12–46)
MCH: 29.7 pg (ref 26.0–34.0)
MCHC: 31.1 g/dL (ref 30.0–36.0)
MCV: 95.7 fL (ref 78.0–100.0)
MONOS PCT: 10 % (ref 3–12)
Monocytes Absolute: 0.9 10*3/uL (ref 0.1–1.0)
NEUTROS PCT: 80 % — AB (ref 43–77)
Neutro Abs: 7.3 10*3/uL (ref 1.7–7.7)
PLATELETS: 329 10*3/uL (ref 150–400)
RBC: 4.14 MIL/uL — AB (ref 4.22–5.81)
RDW: 15.4 % (ref 11.5–15.5)
WBC: 9.1 10*3/uL (ref 4.0–10.5)

## 2013-05-26 LAB — BASIC METABOLIC PANEL
BUN: 28 mg/dL — ABNORMAL HIGH (ref 6–23)
CO2: 36 mEq/L — ABNORMAL HIGH (ref 19–32)
Calcium: 9.2 mg/dL (ref 8.4–10.5)
Chloride: 93 mEq/L — ABNORMAL LOW (ref 96–112)
Creatinine, Ser: 1.46 mg/dL — ABNORMAL HIGH (ref 0.50–1.35)
GFR, EST AFRICAN AMERICAN: 60 mL/min — AB (ref >90)
GFR, EST NON AFRICAN AMERICAN: 52 mL/min — AB (ref >90)
Glucose, Bld: 203 mg/dL — ABNORMAL HIGH (ref 70–99)
Potassium: 4 mEq/L (ref 3.7–5.3)
SODIUM: 139 meq/L (ref 137–147)

## 2013-05-26 LAB — PRO B NATRIURETIC PEPTIDE: PRO B NATRI PEPTIDE: 10565 pg/mL — AB (ref 0–125)

## 2013-05-26 LAB — I-STAT TROPONIN, ED: TROPONIN I, POC: 0.05 ng/mL (ref 0.00–0.08)

## 2013-05-26 LAB — PROTIME-INR
INR: 3.27 — AB (ref 0.00–1.49)
Prothrombin Time: 32.1 seconds — ABNORMAL HIGH (ref 11.6–15.2)

## 2013-05-26 LAB — DIGOXIN LEVEL: DIGOXIN LVL: 1.5 ng/mL (ref 0.8–2.0)

## 2013-05-26 MED ORDER — LINAGLIPTIN 5 MG PO TABS
5.0000 mg | ORAL_TABLET | Freq: Every day | ORAL | Status: DC
  Administered 2013-05-27 – 2013-06-11 (×16): 5 mg via ORAL
  Filled 2013-05-26 (×17): qty 1

## 2013-05-26 MED ORDER — CLONIDINE HCL 0.1 MG PO TABS
0.1000 mg | ORAL_TABLET | Freq: Two times a day (BID) | ORAL | Status: DC
  Administered 2013-05-26 – 2013-06-11 (×32): 0.1 mg via ORAL
  Filled 2013-05-26 (×32): qty 1

## 2013-05-26 MED ORDER — ARFORMOTEROL TARTRATE 15 MCG/2ML IN NEBU
15.0000 ug | INHALATION_SOLUTION | Freq: Two times a day (BID) | RESPIRATORY_TRACT | Status: DC
  Administered 2013-05-27 – 2013-06-11 (×31): 15 ug via RESPIRATORY_TRACT
  Filled 2013-05-26 (×32): qty 2

## 2013-05-26 MED ORDER — POTASSIUM CHLORIDE CRYS ER 10 MEQ PO TBCR
10.0000 meq | EXTENDED_RELEASE_TABLET | Freq: Every day | ORAL | Status: DC
  Administered 2013-05-27: 10 meq via ORAL
  Filled 2013-05-26 (×3): qty 1

## 2013-05-26 MED ORDER — ALBUTEROL SULFATE (2.5 MG/3ML) 0.083% IN NEBU
2.5000 mg | INHALATION_SOLUTION | Freq: Once | RESPIRATORY_TRACT | Status: AC
  Administered 2013-05-26: 2.5 mg via RESPIRATORY_TRACT
  Filled 2013-05-26: qty 3

## 2013-05-26 MED ORDER — PRASUGREL HCL 10 MG PO TABS
10.0000 mg | ORAL_TABLET | Freq: Every day | ORAL | Status: DC
  Administered 2013-05-27 – 2013-06-11 (×16): 10 mg via ORAL
  Filled 2013-05-26 (×17): qty 1

## 2013-05-26 MED ORDER — SODIUM CHLORIDE 0.9 % IJ SOLN
3.0000 mL | Freq: Two times a day (BID) | INTRAMUSCULAR | Status: DC
  Administered 2013-05-26 – 2013-06-10 (×24): 3 mL via INTRAVENOUS

## 2013-05-26 MED ORDER — LOSARTAN POTASSIUM 50 MG PO TABS
100.0000 mg | ORAL_TABLET | Freq: Every day | ORAL | Status: DC
  Administered 2013-05-27 – 2013-06-11 (×16): 100 mg via ORAL
  Filled 2013-05-26 (×16): qty 2

## 2013-05-26 MED ORDER — IPRATROPIUM-ALBUTEROL 0.5-2.5 (3) MG/3ML IN SOLN
3.0000 mL | Freq: Once | RESPIRATORY_TRACT | Status: AC
  Administered 2013-05-26: 3 mL via RESPIRATORY_TRACT
  Filled 2013-05-26: qty 3

## 2013-05-26 MED ORDER — POTASSIUM CHLORIDE ER 10 MEQ PO TBCR
10.0000 meq | EXTENDED_RELEASE_TABLET | Freq: Every day | ORAL | Status: DC
Start: 2013-05-26 — End: 2013-06-03

## 2013-05-26 MED ORDER — SODIUM CHLORIDE 0.9 % IJ SOLN: 3.0000 mL | INTRAMUSCULAR | Status: DC | PRN

## 2013-05-26 MED ORDER — AZITHROMYCIN 250 MG PO TABS
500.0000 mg | ORAL_TABLET | Freq: Every day | ORAL | Status: AC
  Administered 2013-05-26: 500 mg via ORAL
  Filled 2013-05-26: qty 2

## 2013-05-26 MED ORDER — AZITHROMYCIN 250 MG PO TABS
250.0000 mg | ORAL_TABLET | Freq: Every day | ORAL | Status: AC
  Administered 2013-05-27 – 2013-05-30 (×4): 250 mg via ORAL
  Filled 2013-05-26 (×4): qty 1

## 2013-05-26 MED ORDER — WARFARIN - PHYSICIAN DOSING INPATIENT
Freq: Every day | Status: DC
  Administered 2013-05-27 – 2013-06-10 (×7)

## 2013-05-26 MED ORDER — SODIUM CHLORIDE 0.9 % IV SOLN
250.0000 mL | INTRAVENOUS | Status: DC | PRN
  Administered 2013-06-06: 250 mL via INTRAVENOUS

## 2013-05-26 MED ORDER — ACETAMINOPHEN 500 MG PO TABS
1000.0000 mg | ORAL_TABLET | Freq: Four times a day (QID) | ORAL | Status: DC | PRN
  Administered 2013-05-26 – 2013-06-11 (×45): 1000 mg via ORAL
  Filled 2013-05-26 (×45): qty 2

## 2013-05-26 MED ORDER — FUROSEMIDE 40 MG PO TABS: 40.0000 mg | ORAL_TABLET | Freq: Two times a day (BID) | ORAL | Status: DC

## 2013-05-26 MED ORDER — WARFARIN SODIUM 2.5 MG PO TABS
2.5000 mg | ORAL_TABLET | Freq: Every day | ORAL | Status: DC
  Administered 2013-05-27: 2.5 mg via ORAL
  Filled 2013-05-26: qty 1

## 2013-05-26 MED ORDER — ALBUTEROL SULFATE (2.5 MG/3ML) 0.083% IN NEBU
2.5000 mg | INHALATION_SOLUTION | RESPIRATORY_TRACT | Status: DC | PRN
  Administered 2013-06-03: 2.5 mg via RESPIRATORY_TRACT
  Filled 2013-05-26: qty 3

## 2013-05-26 MED ORDER — METHYLPREDNISOLONE SODIUM SUCC 125 MG IJ SOLR
80.0000 mg | Freq: Two times a day (BID) | INTRAMUSCULAR | Status: DC
  Administered 2013-05-26 – 2013-06-02 (×14): 80 mg via INTRAVENOUS
  Filled 2013-05-26 (×14): qty 2

## 2013-05-26 MED ORDER — ALBUTEROL (5 MG/ML) CONTINUOUS INHALATION SOLN
10.0000 mg/h | INHALATION_SOLUTION | Freq: Once | RESPIRATORY_TRACT | Status: AC
  Administered 2013-05-26: 10 mg/h via RESPIRATORY_TRACT
  Filled 2013-05-26: qty 20

## 2013-05-26 MED ORDER — AMIODARONE HCL 200 MG PO TABS
200.0000 mg | ORAL_TABLET | Freq: Every day | ORAL | Status: DC
  Administered 2013-05-27 – 2013-06-11 (×16): 200 mg via ORAL
  Filled 2013-05-26 (×16): qty 1

## 2013-05-26 MED ORDER — IPRATROPIUM BROMIDE 0.02 % IN SOLN
0.5000 mg | Freq: Four times a day (QID) | RESPIRATORY_TRACT | Status: DC
  Administered 2013-05-26 – 2013-06-11 (×61): 0.5 mg via RESPIRATORY_TRACT
  Filled 2013-05-26 (×62): qty 2.5

## 2013-05-26 MED ORDER — PREDNISONE 20 MG PO TABS
40.0000 mg | ORAL_TABLET | Freq: Once | ORAL | Status: AC
  Administered 2013-05-26: 40 mg via ORAL
  Filled 2013-05-26: qty 2

## 2013-05-26 MED ORDER — PANTOPRAZOLE SODIUM 40 MG PO TBEC
40.0000 mg | DELAYED_RELEASE_TABLET | Freq: Every day | ORAL | Status: DC
  Administered 2013-05-27 – 2013-06-11 (×16): 40 mg via ORAL
  Filled 2013-05-26 (×16): qty 1

## 2013-05-26 MED ORDER — ALBUTEROL SULFATE (2.5 MG/3ML) 0.083% IN NEBU: 2.5000 mg | INHALATION_SOLUTION | Freq: Four times a day (QID) | RESPIRATORY_TRACT | Status: DC

## 2013-05-26 MED ORDER — METOPROLOL TARTRATE 25 MG PO TABS
75.0000 mg | ORAL_TABLET | Freq: Two times a day (BID) | ORAL | Status: DC
Start: 2013-05-26 — End: 2013-06-11
  Administered 2013-05-26 – 2013-06-11 (×32): 75 mg via ORAL
  Filled 2013-05-26 (×32): qty 3

## 2013-05-26 MED ORDER — GUAIFENESIN-DM 100-10 MG/5ML PO SYRP
5.0000 mL | ORAL_SOLUTION | ORAL | Status: DC | PRN
  Administered 2013-05-29 – 2013-06-07 (×24): 5 mL via ORAL
  Filled 2013-05-26 (×27): qty 5

## 2013-05-26 MED ORDER — DIGOXIN 125 MCG PO TABS
0.1250 mg | ORAL_TABLET | Freq: Every morning | ORAL | Status: DC
  Administered 2013-05-27 – 2013-06-11 (×16): 0.125 mg via ORAL
  Filled 2013-05-26 (×17): qty 1

## 2013-05-26 MED ORDER — RAMIPRIL 2.5 MG PO CAPS
2.5000 mg | ORAL_CAPSULE | Freq: Every day | ORAL | Status: DC
  Administered 2013-05-27 – 2013-06-11 (×16): 2.5 mg via ORAL
  Filled 2013-05-26 (×16): qty 1

## 2013-05-26 MED ORDER — LEVALBUTEROL HCL 1.25 MG/0.5ML IN NEBU
1.2500 mg | INHALATION_SOLUTION | Freq: Four times a day (QID) | RESPIRATORY_TRACT | Status: DC
  Administered 2013-05-26 – 2013-06-11 (×61): 1.25 mg via RESPIRATORY_TRACT
  Filled 2013-05-26 (×61): qty 0.5

## 2013-05-26 MED ORDER — FUROSEMIDE 40 MG PO TABS
40.0000 mg | ORAL_TABLET | Freq: Two times a day (BID) | ORAL | Status: DC
  Administered 2013-05-27: 40 mg via ORAL
  Filled 2013-05-26: qty 1

## 2013-05-26 MED ORDER — BIOTENE DRY MOUTH MT LIQD
15.0000 mL | Freq: Four times a day (QID) | OROMUCOSAL | Status: DC | PRN
  Administered 2013-06-06: 15 mL via OROMUCOSAL

## 2013-05-26 MED ORDER — BUDESONIDE 0.25 MG/2ML IN SUSP
0.2500 mg | Freq: Two times a day (BID) | RESPIRATORY_TRACT | Status: DC
  Administered 2013-05-27 – 2013-06-11 (×31): 0.25 mg via RESPIRATORY_TRACT
  Filled 2013-05-26 (×32): qty 2

## 2013-05-26 MED ORDER — NITROGLYCERIN 0.4 MG SL SUBL: 0.4000 mg | SUBLINGUAL_TABLET | SUBLINGUAL | Status: DC | PRN

## 2013-05-26 NOTE — Telephone Encounter (Signed)
He should have less then 2 L of fluid in 24 hours. I see that Lasix was increased to 40 mg twice daily based on the discharge summary. Okay to provide a prescription for this. Would increase potassium from 10 mEq every other day to every day. He needs to have a followup BMET in 2 weeks as he may need additional potassium. As far as the prednisone is concerned, I would have him address this with his pulmonologist Dr. Delton Coombes, who he in fact saw after discharge from the hospital. I am not certain what dose of prednisone he should be on. It looks like he was on a taper.

## 2013-05-26 NOTE — ED Provider Notes (Signed)
CSN: [id number]     Arrival date & time 05/26/13  1402 History  This chart was scribed for Joya Gaskinsonald W Ipek Westra, MD by Manuela Schwartzaylor Day, ED scribe. This patient was seen in room APA15/APA15 and the patient's care was started at 2:21 PM.    Chief Complaint  Patient presents with  . Shortness of Breath     The history is provided by the patient. No language interpreter was used.   HPI Comments: Danny [name] is a 58 y.o. male with a h/o COPD and CHF who presents to the Emergency Department complaining of constant gradually worsened moderate shortness of breath that began to worsen yesterday. He states he is always on 4 L of O2 and increased to 6 L having O2 sats of 90%. He reports experiencing associated chest pain and syncope from coughing. He reports giving himself an Albuterol treatment today before coming into the ED. Pt reports increased swelling of the lower extremities. Pt denies abdominal pain, lower extremity pain. Pt has h/o of coronary atherosclerosis. Pt denies h/o DVT and PE. He was discharged last Monday from [facility] for CHF.  He received a new Tudorza inhaler from his pulmonologist, Dr. Delton CoombesByrum, 4 days ago, which he reports helped him manage his phlegm, however worsened his cough. He also reports taking coumadin and digoxin. Pt denies antibiotic use.   Past Medical History  Diagnosis Date  . GERD (gastroesophageal reflux disease)   . COPD (chronic obstructive pulmonary disease)   . Chronic combined systolic and diastolic CHF (congestive heart failure)   . Essential hypertension, benign   . Cardiomyopathy     a. previous LVEF 40-45%;  b. 03/2013 Echo: EF 50-55%, Gr 1 DD, sev dil RV/RA with sev RV dysfxn, PASP 51mmHg. No evidence of RV thrombus.  . Right ventricular mural thrombus     a. On Coumadin  . Cor pulmonale     a. Severe RV dysfunction;  b. 04/2013 R heart cath: RA 17, RV 70/18 (22), PA 73/52.  . Coronary atherosclerosis of native coronary artery     a. 05/2012 MI/PCI: DES to OM  [facility];  b. 04/2013 Cath/PCI: LM nl, LAD nonobs, LCX patent mid stent, RCA dom, 110m (2.5x16 Promus DES).  Marland Kitchen. PAF (paroxysmal atrial fibrillation)   . Spontaneous pneumothorax 2002    Bilateral  . PAT (paroxysmal atrial tachycardia)     a. on amio/bb.  . High cholesterol   . Myocardial infarction 2014  . Pneumonia     "several times" (05/16/2013)  . Emphysema of lung   . On home oxygen therapy     "4L; 24/7" (05/16/2013)  . Shortness of breath     "all the time" (05/16/2013)  . Type II diabetes mellitus dx'd 05/13/2013  . Chronic kidney disease     "early stages" (05/16/2013)   Past Surgical History  Procedure Laterality Date  . 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 Coronary angioplasty with stent placement  04/2012; 04/2013    [place] ("1"); "1; total of 2 now" (05/16/2013)  . Pilonidal cyst excision  ~ 1990   No family history on file. History  Substance Use Topics  . Smoking status: Former Smoker -- 2.00 packs/day for 25 years    Types: Cigarettes    Quit date: 04/03/1994  . Smokeless tobacco: Never Used  . Alcohol Use: Yes     Comment: 05/16/2013 "drank socialably; last drink 18 months or more ago"  Review of Systems  Constitutional: Negative for fever and chills.  Respiratory: Positive for cough and shortness of breath.   Cardiovascular: Positive for chest pain and leg swelling.  Gastrointestinal: Negative for abdominal pain.  Musculoskeletal: Negative for back pain.  All other systems reviewed and are negative.      Allergies  Lipitor  Home Medications   Current Outpatient Rx  Name  Route  Sig  Dispense  Refill  . acetaminophen (TYLENOL) 500 MG tablet   Oral   Take 1,000 mg by mouth every 6 (six) hours as needed for moderate pain.          Marland Kitchen albuterol (PROVENTIL HFA;VENTOLIN HFA) 108 (90 BASE) MCG/ACT inhaler   Inhalation   Inhale 2 puffs into the lungs every 6 (six) hours as needed for wheezing or shortness of  breath.          Marland Kitchen amiodarone (PACERONE) 200 MG tablet   Oral   Take 1 tablet (200 mg total) by mouth daily.   30 tablet   6   . antiseptic oral rinse (BIOTENE) LIQD   Mouth Rinse   15 mLs by Mouth Rinse route 4 (four) times daily as needed for dry mouth.         Marland Kitchen arformoterol (BROVANA) 15 MCG/2ML NEBU   Nebulization   Take 15 mcg by nebulization 2 (two) times daily.         . budesonide (PULMICORT) 0.25 MG/2ML nebulizer solution   Nebulization   Take 0.25 mg by nebulization 2 (two) times daily.         . cloNIDine (CATAPRES) 0.1 MG tablet   Oral   Take 1 tablet (0.1 mg total) by mouth 2 (two) times daily.         . clotrimazole (MYCELEX) 10 MG troche   Oral   Take 10 mg by mouth daily.          . digoxin (LANOXIN) 0.125 MG tablet   Oral   Take 0.125 mg by mouth every morning.         . furosemide (LASIX) 40 MG tablet   Oral   Take 1 tablet (40 mg total) by mouth 2 (two) times daily. *takes an extra 40mg  tablet if needed for swelling   60 tablet   6   . levalbuterol (XOPENEX) 1.25 MG/0.5ML nebulizer solution   Nebulization   Take 1.25 mg by nebulization every 6 (six) hours.   1 each   12   . losartan (COZAAR) 100 MG tablet   Oral   Take 100 mg by mouth every morning.          . metoprolol (LOPRESSOR) 50 MG tablet   Oral   Take 1.5 tablets (75 mg total) by mouth 2 (two) times daily.   90 tablet   6   . nitroGLYCERIN (NITROSTAT) 0.4 MG SL tablet   Sublingual   Place 1 tablet (0.4 mg total) under the tongue every 5 (five) minutes x 3 doses as needed for chest pain.   25 tablet   3   . omeprazole (PRILOSEC) 20 MG capsule   Oral   Take 20 mg by mouth 2 (two) times daily.          . potassium chloride (K-DUR) 10 MEQ tablet   Oral   Take 1 tablet (10 mEq total) by mouth daily.   30 tablet   6   . prasugrel (EFFIENT) 10 MG TABS tablet   Oral  Take 1 tablet (10 mg total) by mouth daily.   30 tablet   6   . predniSONE (DELTASONE)  10 MG tablet   Oral   Take 1 tablet (10 mg total) by mouth See admin instructions. 30mg  a day for 2 days, then 20mg  a day for 2 days, then resume your 10mg  a day.         . ramipril (ALTACE) 2.5 MG capsule   Oral   Take 2.5 mg by mouth daily.          . rosuvastatin (CRESTOR) 10 MG tablet   Oral   Take 10 mg by mouth every evening.         . sodium chloride (OCEAN) 0.65 % nasal spray   Nasal   Place 1 spray into the nose every 6 (six) hours as needed for congestion.         Marland Kitchen warfarin (COUMADIN) 2.5 MG tablet   Oral   Take 2.5-3.75 mg by mouth daily. Take 2.5 mg tablet alternating with 3.75 mg  tablets every other day          Triage Vitals: BP 101/56  Pulse 79  Temp(Src) 98.1 F (36.7 C) (Oral)  Resp 18  Ht 5\' 7"  (1.702 m)  Wt 159 lb (72.122 kg)  BMI 24.90 kg/m2  SpO2 91%  Physical Exam CONSTITUTIONAL: Well developed/well nourished HEAD: Normocephalic/atraumatic EYES: EOMI/PERRL ENMT: Mucous membranes moist NECK: supple no meningeal signs SPINE:entire spine nontender CV: S1/S2 noted, no murmurs/rubs/gallops noted LUNGS: Decreased breath sounds bilaterally, tachypnea ABDOMEN: soft, nontender, no rebound or guarding GU:no cva tenderness NEURO: Pt is awake/alert, moves all extremitiesx4 EXTREMITIES: pulses normal, full ROM, 1+ pitting edema bilateral LE SKIN: warm, color normal PSYCH: no abnormalities of mood noted  ED Course  Procedures   DIAGNOSTIC STUDIES: Oxygen Saturation is 91% on [place], low by my interpretation.    COORDINATION OF CARE: At 2:31 PM Discussed treatment plan with patient which includes Albuterol treatment and a CXR. Patient agrees.  4:26 PM Pt feels improved Wheezing now heard, but appears more comfortable Will give another neb treatment I doubt ACS/PE at this time 6:15 PM Pt was SOB with any movement and began to desat in the room  Suspicious for worsening COPD and he is high risk for worsening Will admit D/w dr Irene Limbo with  triad will admit to hospital  Labs Review Labs Reviewed  BASIC METABOLIC PANEL - Abnormal; Notable for the following:    Chloride 93 (*)    CO2 36 (*)    Glucose, Bld 203 (*)    BUN 28 (*)    Creatinine, Ser 1.46 (*)    GFR calc non Af Amer 52 (*)    GFR calc Af Amer 60 (*)    All other components within normal limits  CBC WITH DIFFERENTIAL - Abnormal; Notable for the following:    RBC 4.14 (*)    Hemoglobin 12.3 (*)    Neutrophils Relative % 80 (*)    Lymphocytes Relative 9 (*)    All other components within normal limits  PRO B NATRIURETIC PEPTIDE - Abnormal; Notable for the following:    Pro B Natriuretic peptide (BNP) 10565.0 (*)    All other components within normal limits  PROTIME-INR - Abnormal; Notable for the following:    Prothrombin Time 32.1 (*)    INR 3.27 (*)    All other components within normal limits  DIGOXIN LEVEL  I-STAT TROPOININ, ED   Imaging Review Dg  Chest Portable 1 View  05/26/2013   CLINICAL DATA:  Shortness of breath, cough and congestion.  EXAM: PORTABLE CHEST - 1 VIEW  COMPARISON:  PA and lateral chest 05/15/2013.  CT chest 10/23/2013.  FINDINGS: The lungs are emphysematous but clear. There is cardiomegaly. Postsurgical change on the left is again identified.  IMPRESSION: No acute finding.  Stable compared to prior exams.   Electronically Signed   By: Drusilla Kanner M.D.   On: 05/26/2013 14:42    EKG Interpretation    Date/Time:  Monday May 26 2013 14:06:20 EST Ventricular Rate:  82 PR Interval:  196 QRS Duration: 104 QT Interval:  358 QTC Calculation: 418 R Axis:   146 Text Interpretation:  Normal sinus rhythm Right axis deviation Pulmonary disease pattern Right ventricular hypertrophy Abnormal ECG No significant change since last tracing Confirmed by Bebe Shaggy  MD, Donnika Kucher [phone]) on 05/26/2013 2:48:37 PM            MDM   Final diagnoses:  COPD (chronic obstructive pulmonary disease)    Nursing notes including past medical  history and social history reviewed and considered in documentation xrays reviewed and considered Labs/vital reviewed and considered   I personally performed the services described in this documentation, which was scribed in my presence. The recorded information has been reviewed and is accurate.     Joya Gaskins, MD 05/26/13 [phone]

## 2013-05-26 NOTE — Telephone Encounter (Signed)
Advised pt wife about medication changes, she noted the pharmacy change to [facility] in [place], and she notes they will contact the pulmonologist today about the prednisone, aware the orders are placed for non fasting labs in 2 weeks and will inform the pt when the results come we will contact them, pt wife understood all instructions and will implement and call our office with any further concerns if noted.

## 2013-05-26 NOTE — ED Notes (Signed)
Assisted pt up to ambulate. Pt reports he wears 5L O2 to ambulate. 5L placed. When pt exerted self to stand to walk O2 dropped to 76. Ambulating it leveled at 83. Back at rest on 4L sats 92-93%. Pt was able to speak short one to two words while ambulating.

## 2013-05-26 NOTE — ED Notes (Addendum)
Pt c/o sob/congestion/productive cough-yellow sputum since Thursday. Pt seen by pulmonologist on Thursday and given Tudorza inhaler. Pt c/o increase in symptoms. Pt also c/o retaining fluid and gaining 3 lbs since yesterday.

## 2013-05-26 NOTE — Telephone Encounter (Signed)
Patient just released [facility].  Needs to what his daily fluid restriction is.  Needs RX for Prednisone that he was put on in the hospital. Increased Lasix to 40 mg BID and needs new RX for that also.  Do they need to increase Potassium due to increasing Lasix / tgs

## 2013-05-26 NOTE — Telephone Encounter (Signed)
Spoke w/ spouse. appt scheduled w/ MR this afternoon for acute visit. Nothing further needed

## 2013-05-26 NOTE — Telephone Encounter (Signed)
Unable to locate instructions in discharge summary to indicate fluid restrictions please advise concerning requests for medications below as well

## 2013-05-26 NOTE — H&P (Signed)
PCP:   Isabella Stalling, MD   Chief Complaint:  Sob, wheezing  HPI: 58 yo male h/o severe copd on 4 l [place] at home, steroid dependent pred 10mg  daily, RV thrombus, afib, chronic anticoagulation, combined chronic chf comes in with several days of worsening sob and wheezing.  His pulm a couple days ago changed his inhaler to [place], pt says that this inhaler has been breaking up his mucus a lot causing him to cough, but is having trouble coughing it up.  But he feels this inhaler is working.  Has been wheezing more than usual.  No fevers at home.  No le edema or swelling worse than baseline.  Feels better with neb tx in ED x 3.  Breathing back to baseline.  Review of Systems:  Positive and negative as per HPI otherwise all other systems are negative  Past Medical History: Past Medical History  Diagnosis Date  . GERD (gastroesophageal reflux disease)   . COPD (chronic obstructive pulmonary disease)   . Chronic combined systolic and diastolic CHF (congestive heart failure)   . Essential hypertension, benign   . Cardiomyopathy     a. previous LVEF 40-45%;  b. 03/2013 Echo: EF 50-55%, Gr 1 DD, sev dil RV/RA with sev RV dysfxn, PASP . No evidence of RV thrombus.  . Right ventricular mural thrombus     a. On Coumadin  . Cor pulmonale     a. Severe RV dysfunction;  b. 04/2013 R heart cath: RA 17, RV 70/18 (22), PA 73/52.  . Coronary atherosclerosis of native coronary artery     a. 05/2012 MI/PCI: DES to OM [facility];  b. 04/2013 Cath/PCI: LM nl, LAD nonobs, LCX patent mid stent, RCA dom, 58m (2.5x16 Promus DES).  Marland Kitchen PAF (paroxysmal atrial fibrillation)   . Spontaneous pneumothorax 2002    Bilateral  . PAT (paroxysmal atrial tachycardia)     a. on amio/bb.  . High cholesterol   . Myocardial infarction 2014  . Pneumonia     "several times" (05/16/2013)  . Emphysema of lung   . On home oxygen therapy     "4L; 24/7" (05/16/2013)  . Shortness of breath     "all the time"  (05/16/2013)  . Type II diabetes mellitus dx'd 05/13/2013  . Chronic kidney disease     "early stages" (05/16/2013)   Past Surgical History  Procedure Laterality Date  . 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 Coronary angioplasty with stent placement  04/2012; 04/2013    in [place], [place] ("1"); "1; total of 2 now" (05/16/2013)  . Pilonidal cyst excision  ~ 1990    Medications: Prior to Admission medications   Medication Sig Start Date End Date Taking? Authorizing Provider  acetaminophen (TYLENOL) 500 MG tablet Take 1,000 mg by mouth every 6 (six) hours as needed for moderate pain.    Yes Historical Provider, MD  Aclidinium Bromide (TUDORZA PRESSAIR) 400 MCG/ACT AEPB Inhale 1 puff into the lungs 2 (two) times daily.   Yes Historical Provider, MD  albuterol (PROVENTIL HFA;VENTOLIN HFA) 108 (90 BASE) MCG/ACT inhaler Inhale 2 puffs into the lungs every 6 (six) hours as needed for wheezing or shortness of breath.    Yes Historical Provider, MD  amiodarone (PACERONE) 200 MG tablet Take 1 tablet (200 mg total) by mouth daily. 04/27/13  Yes Ok Anis, NP  antiseptic oral rinse (BIOTENE) LIQD 15 mLs by Mouth Rinse route 4 (four) times daily as needed  for dry mouth.   Yes Historical Provider, MD  arformoterol (BROVANA) 15 MCG/2ML NEBU Take 15 mcg by nebulization 2 (two) times daily.   Yes Historical Provider, MD  budesonide (PULMICORT) 0.25 MG/2ML nebulizer solution Take 0.25 mg by nebulization 2 (two) times daily.   Yes Historical Provider, MD  cloNIDine (CATAPRES) 0.1 MG tablet Take 1 tablet (0.1 mg total) by mouth 2 (two) times daily. 05/18/13  Yes Vassie Loll, MD  clotrimazole (MYCELEX) 10 MG troche Take 10 mg by mouth daily.  02/25/13  Yes Historical Provider, MD  digoxin (LANOXIN) 0.125 MG tablet Take 0.125 mg by mouth every morning.   Yes Historical Provider, MD  furosemide (LASIX) 40 MG tablet Take 1 tablet (40 mg total) by mouth 2 (two) times daily. *takes an extra 40mg   tablet if needed for swelling 05/26/13  Yes Jonelle Sidle, MD  levalbuterol [facility]) 1.25 MG/0.5ML nebulizer solution Take 1.25 mg by nebulization every 6 (six) hours. 04/01/13  Yes Dorothea Ogle, MD  linagliptin (TRADJENTA) 5 MG TABS tablet Take 5 mg by mouth daily.   Yes Historical Provider, MD  losartan (COZAAR) 100 MG tablet Take 100 mg by mouth every morning.  02/03/13  Yes Historical Provider, MD  metoprolol (LOPRESSOR) 50 MG tablet Take 1.5 tablets (75 mg total) by mouth 2 (two) times daily. 04/24/13  Yes Ok Anis, NP  omeprazole (PRILOSEC) 20 MG capsule Take 20 mg by mouth 2 (two) times daily.    Yes Historical Provider, MD  potassium chloride (K-DUR) 10 MEQ tablet Take 1 tablet (10 mEq total) by mouth daily. 05/26/13  Yes Jonelle Sidle, MD  prasugrel (EFFIENT) 10 MG TABS tablet Take 1 tablet (10 mg total) by mouth daily. 04/24/13  Yes Ok Anis, NP  predniSONE (DELTASONE) 10 MG tablet Take 1 tablet (10 mg total) by mouth See admin instructions. 30mg  a day for 2 days, then 20mg  a day for 2 days, then resume your 10mg  a day. 05/18/13  Yes Vassie Loll, MD  ramipril (ALTACE) 2.5 MG capsule Take 2.5 mg by mouth daily.  04/11/13  Yes Historical Provider, MD  rosuvastatin (CRESTOR) 10 MG tablet Take 10 mg by mouth every evening.   Yes Historical Provider, MD  sodium chloride (OCEAN) 0.65 % nasal spray Place 1 spray into the nose every 6 (six) hours as needed for congestion.   Yes Historical Provider, MD  warfarin (COUMADIN) 2.5 MG tablet Take 2.5 mg by mouth daily at 6 PM.    Yes Historical Provider, MD  nitroGLYCERIN (NITROSTAT) 0.4 MG SL tablet Place 1 tablet (0.4 mg total) under the tongue every 5 (five) minutes x 3 doses as needed for chest pain. 04/24/13   Ok Anis, NP    Allergies:   Allergies  Allergen Reactions  . Lipitor [Atorvastatin] Other (See Comments)    Muscle spasms     Social History:  reports that he quit smoking about 19 years ago. His  smoking use included Cigarettes. He has a 50 pack-year smoking history. He has never used smokeless tobacco. He reports that he drinks alcohol. He reports that he does not use illicit drugs.  Family History: none  Physical Exam: Filed Vitals:   05/26/13 1618 05/26/13 1737 05/26/13 1844 05/26/13 1854  BP:  127/72 90/68   Pulse:  88 83   Temp:      TempSrc:      Resp:  15 18   Height:      Weight:  SpO2: 95% 96% 100% 90%   General appearance: alert, cooperative and no distress Head: Normocephalic, without obvious abnormality, atraumatic Eyes: negative Nose: Nares normal. Septum midline. Mucosa normal. No drainage or sinus tenderness. Neck: no JVD and supple, symmetrical, trachea midline Lungs: diminished breath sounds bibasilar  No w/r/r no crackles good air movement Heart: regular rate and rhythm, S1, S2 normal, no murmur, click, rub or gallop Abdomen: soft, non-tender; bowel sounds normal; no masses,  no organomegaly Extremities: extremities normal, atraumatic, no cyanosis or edema Pulses: 2+ and symmetric Skin: Skin color, texture, turgor normal. No rashes or lesions Neurologic: Grossly normal    Labs on Admission:   Recent Labs  05/26/13 1445  NA 139  K 4.0  CL 93*  CO2 36*  GLUCOSE 203*  BUN 28*  CREATININE 1.46*  CALCIUM 9.2    Recent Labs  05/26/13 1445  WBC 9.1  NEUTROABS 7.3  HGB 12.3*  HCT 39.6  MCV 95.7  PLT 329    Radiological Exams on Admission: Nm Myocar Multi W/spect W/wall Motion / Ef  05/17/2013   CLINICAL DATA:  Chest pain, history hypertension, CHF, CAD  EXAM: MYOCARDIAL IMAGING WITH SPECT (REST AND PHARMACOLOGIC-STRESS)  GATED LEFT VENTRICULAR WALL MOTION STUDY  LEFT VENTRICULAR EJECTION FRACTION  TECHNIQUE: Standard myocardial SPECT imaging was performed after resting intravenous injection of 10 mCi [id number] sestamibi. Subsequently, intravenous infusion of Lexiscan was performed under the supervision of the Cardiology staff. At peak  effect of the drug, 30 mCi [id number] sestamibi was injected intravenously and standard myocardial SPECT imaging was performed. Quantitative gated imaging was also performed to evaluate left ventricular wall motion, and estimate left ventricular ejection fraction.  COMPARISON:  DG CHEST 2 VIEW dated 05/15/2013  FINDINGS: Review of the rotational raw images demonstrates mild motion artifact on the provided rest and stress images. There is no significant chest wall or GI attenuation  SPECT imaging is negative for discrete matched or mismatched perfusion defect.  Quantitative gated analysis demonstrates normal wall motion.  The resting left ventricular ejection fraction is 79% with end-diastolic volume of 50 ml and end-systolic volume of 10 ml.  IMPRESSION: 1. No scintigraphic evidence of prior infarction or pharmacologically induced ischemia. 2. Normal wall motion.  Ejection fraction -79%.   Electronically Signed   By: Simonne ComeJohn  Watts M.D.   On: 05/17/2013 13:54   Dg Chest Portable 1 View  05/26/2013   CLINICAL DATA:  Shortness of breath, cough and congestion.  EXAM: PORTABLE CHEST - 1 VIEW  COMPARISON:  PA and lateral chest 05/15/2013.  CT chest 10/23/2013.  FINDINGS: The lungs are emphysematous but clear. There is cardiomegaly. Postsurgical change on the left is again identified.  IMPRESSION: No acute finding.  Stable compared to prior exams.   Electronically Signed   By: Drusilla Kannerhomas  Dalessio M.D.   On: 05/26/2013 14:42    Assessment/Plan  58 yo male with copd exacerbation with multiple medical problems  Principal Problem:   COPD exacerbation-  Improved with freq nebs in ED.  cxr no edema or infiltrate.  Will place on on iv solumedrol, place on zpack.  vss on his 4 l oxygen, cont this.  Cont freq nebs.    Active Problems:   Coronary atherosclerosis of native coronary artery  stable   RV (right ventricular) mural thrombus  Cont coumadin inr therapeutic   Long term (current) use of anticoagulants as above    Secondary cardiomyopathy, unspecified  stable   Pulmonary hypertension  stable   Atrial fibrillation  stable   PAT (paroxysmal atrial tachycardia)  stable   Chronic respiratory failure  Back to baseline   Acute on chronic respiratory failure with hypoxia  Improved with bronchodilators   Chronic combined systolic and diastolic CHF (congestive heart failure)  Compensated at this time   Steroid-dependent COPD  Increase steroids   Renal insufficiency, mild  monitor  Full code.  pcp dr Marianne Sofia A 05/26/2013, 7:55 PM

## 2013-05-27 ENCOUNTER — Observation Stay (HOSPITAL_COMMUNITY): Payer: Managed Care, Other (non HMO)

## 2013-05-27 ENCOUNTER — Encounter (HOSPITAL_COMMUNITY): Payer: Self-pay | Admitting: Cardiology

## 2013-05-27 DIAGNOSIS — I2510 Atherosclerotic heart disease of native coronary artery without angina pectoris: Secondary | ICD-10-CM

## 2013-05-27 LAB — GLUCOSE, CAPILLARY
GLUCOSE-CAPILLARY: 204 mg/dL — AB (ref 70–99)
Glucose-Capillary: 160 mg/dL — ABNORMAL HIGH (ref 70–99)
Glucose-Capillary: 185 mg/dL — ABNORMAL HIGH (ref 70–99)
Glucose-Capillary: 312 mg/dL — ABNORMAL HIGH (ref 70–99)
Glucose-Capillary: 102 mg/dL — ABNORMAL HIGH (ref 70–99)

## 2013-05-27 LAB — BASIC METABOLIC PANEL
BUN: 29 mg/dL — AB (ref 6–23)
CHLORIDE: 97 meq/L (ref 96–112)
CO2: 39 mEq/L — ABNORMAL HIGH (ref 19–32)
CREATININE: 1.49 mg/dL — AB (ref 0.50–1.35)
Calcium: 9.8 mg/dL (ref 8.4–10.5)
GFR calc Af Amer: 59 mL/min — ABNORMAL LOW (ref >90)
GFR calc non Af Amer: 51 mL/min — ABNORMAL LOW (ref >90)
GLUCOSE: 196 mg/dL — AB (ref 70–99)
POTASSIUM: 5.1 meq/L (ref 3.7–5.3)
Sodium: 143 mEq/L (ref 137–147)

## 2013-05-27 LAB — CBC
HEMATOCRIT: 37 % — AB (ref 39.0–52.0)
HEMOGLOBIN: 11.5 g/dL — AB (ref 13.0–17.0)
MCH: 29.8 pg (ref 26.0–34.0)
MCHC: 31.1 g/dL (ref 30.0–36.0)
MCV: 95.9 fL (ref 78.0–100.0)
Platelets: 338 10*3/uL (ref 150–400)
RBC: 3.86 MIL/uL — ABNORMAL LOW (ref 4.22–5.81)
RDW: 15.5 % (ref 11.5–15.5)
WBC: 7.9 10*3/uL (ref 4.0–10.5)

## 2013-05-27 LAB — PROTIME-INR
INR: 3.03 — ABNORMAL HIGH (ref 0.00–1.49)
Prothrombin Time: 30.3 seconds — ABNORMAL HIGH (ref 11.6–15.2)

## 2013-05-27 MED ORDER — INSULIN ASPART 100 UNIT/ML ~~LOC~~ SOLN
0.0000 [IU] | Freq: Every day | SUBCUTANEOUS | Status: DC
  Administered 2013-05-27 – 2013-06-09 (×4): 2 [IU] via SUBCUTANEOUS

## 2013-05-27 MED ORDER — ACLIDINIUM BROMIDE 400 MCG/ACT IN AEPB
1.0000 | INHALATION_SPRAY | Freq: Two times a day (BID) | RESPIRATORY_TRACT | Status: DC
  Administered 2013-05-27 – 2013-05-28 (×3): 1 via RESPIRATORY_TRACT

## 2013-05-27 MED ORDER — FUROSEMIDE 10 MG/ML IJ SOLN
40.0000 mg | Freq: Two times a day (BID) | INTRAMUSCULAR | Status: DC
  Administered 2013-05-27 – 2013-05-28 (×2): 40 mg via INTRAVENOUS
  Filled 2013-05-27 (×3): qty 4

## 2013-05-27 MED ORDER — INSULIN ASPART 100 UNIT/ML ~~LOC~~ SOLN
0.0000 [IU] | Freq: Three times a day (TID) | SUBCUTANEOUS | Status: DC
  Administered 2013-05-27: 11 [IU] via SUBCUTANEOUS
  Administered 2013-05-28 (×2): 5 [IU] via SUBCUTANEOUS
  Administered 2013-05-28 – 2013-05-29 (×2): 3 [IU] via SUBCUTANEOUS
  Administered 2013-05-29: 2 [IU] via SUBCUTANEOUS
  Administered 2013-05-29: 15 [IU] via SUBCUTANEOUS
  Administered 2013-05-30: 11 [IU] via SUBCUTANEOUS
  Administered 2013-05-30: 5 [IU] via SUBCUTANEOUS
  Administered 2013-05-30: 2 [IU] via SUBCUTANEOUS
  Administered 2013-05-31: 5 [IU] via SUBCUTANEOUS
  Administered 2013-05-31: 3 [IU] via SUBCUTANEOUS
  Administered 2013-05-31: 5 [IU] via SUBCUTANEOUS
  Administered 2013-06-01: 2 [IU] via SUBCUTANEOUS
  Administered 2013-06-01: 5 [IU] via SUBCUTANEOUS
  Administered 2013-06-01: 8 [IU] via SUBCUTANEOUS
  Administered 2013-06-02: 2 [IU] via SUBCUTANEOUS
  Administered 2013-06-02: 8 [IU] via SUBCUTANEOUS
  Administered 2013-06-02: 2 [IU] via SUBCUTANEOUS
  Administered 2013-06-03: 3 [IU] via SUBCUTANEOUS
  Administered 2013-06-03: 2 [IU] via SUBCUTANEOUS
  Administered 2013-06-04 (×2): 5 [IU] via SUBCUTANEOUS
  Administered 2013-06-05: 2 [IU] via SUBCUTANEOUS
  Administered 2013-06-05 (×2): 3 [IU] via SUBCUTANEOUS
  Administered 2013-06-06: 5 [IU] via SUBCUTANEOUS
  Administered 2013-06-06: 2 [IU] via SUBCUTANEOUS
  Administered 2013-06-07 (×2): 3 [IU] via SUBCUTANEOUS
  Administered 2013-06-08: 2 [IU] via SUBCUTANEOUS
  Administered 2013-06-08: 3 [IU] via SUBCUTANEOUS
  Administered 2013-06-10: 2 [IU] via SUBCUTANEOUS
  Administered 2013-06-10: 3 [IU] via SUBCUTANEOUS

## 2013-05-27 MED ORDER — IOHEXOL 300 MG/ML  SOLN
80.0000 mL | Freq: Once | INTRAMUSCULAR | Status: AC | PRN
  Administered 2013-05-27: 80 mL via INTRAVENOUS

## 2013-05-27 NOTE — Consult Note (Signed)
Consult requested by: Dr. Delbert Harness Consult requested for respiratory failure:  HPI: This is a 58 year old with a complicated medical history including severe COPD status post lung reduction surgery. He had a myocardial infarction approximately a year ago has ischemic cardiomyopathy and a right mural thrombus. He has severe right ventricular dysfunction. He was evaluated at [facility] for potential lung transplantation but was not felt to be a candidate because of ongoing issues with his heart and because of his diabetes and renal failure. He came to the emergency room with increasing cough and congestion. His BNP was very high. He had cough syncope earlier.  Past Medical History  Diagnosis Date  . GERD (gastroesophageal reflux disease)   . COPD (chronic obstructive pulmonary disease)   . Chronic combined systolic and diastolic CHF (congestive heart failure)   . Essential hypertension, benign   . Cardiomyopathy     a. previous LVEF 40-45%;  b. 03/2013 Echo: EF 50-55%, Gr 1 DD, sev dil RV/RA with sev RV dysfxn, PASP . No evidence of RV thrombus.  . Right ventricular mural thrombus     a. On Coumadin  . Cor pulmonale     a. Severe RV dysfunction;  b. 04/2013 R heart cath: RA 17, RV 70/18 (22), PA 73/52.  . Coronary atherosclerosis of native coronary artery     a. 05/2012 MI/PCI: DES to OM [facility];  b. 04/2013 Cath/PCI: LM nl, LAD nonobs, LCX patent mid stent, RCA dom, 52m (2.5x16 Promus DES).  Marland Kitchen PAF (paroxysmal atrial fibrillation)   . Spontaneous pneumothorax 2002    Bilateral  . PAT (paroxysmal atrial tachycardia)     a. on amio/bb.  . High cholesterol   . Myocardial infarction 2014  . Pneumonia     "several times" (05/16/2013)  . Emphysema of lung   . On home oxygen therapy     "4L; 24/7" (05/16/2013)  . Shortness of breath     "all the time" (05/16/2013)  . Type II diabetes mellitus dx'd 05/13/2013  . Chronic kidney disease     "early stages" (05/16/2013)     No  family history on file.   History   Social History  . Marital Status: Married    Spouse Name: N/A    Number of Children: N/A  . Years of Education: N/A   Occupational History  . Tour bus driver    Social History Main Topics  . Smoking status: Former Smoker -- 2.00 packs/day for 25 years    Types: Cigarettes    Quit date: 04/03/1994  . Smokeless tobacco: Never Used  . Alcohol Use: Yes     Comment: 05/16/2013 "drank socialably; last drink 18 months or more ago"  . Drug Use: No  . Sexual Activity: No   Other Topics Concern  . None   Social History Narrative   Lives with wife in [place], [place].     ROS: He has extreme shortness of breath. He is coughing up some sputum. He has not had any chest pain. He feels like he's gained more fluid.    Objective: Vital signs in last 24 hours: Temp:  [98.1 F (36.7 C)-98.6 F (37 C)] 98.6 F (37 C) (02/23 2207) Pulse Rate:  [72-88] 72 (02/24 0650) Resp:  [15-20] 20 (02/24 0650) BP: (90-127)/(56-79) 109/70 mmHg (02/24 0650) SpO2:  [89 %-100 %] 93 % (02/24 0815) Weight:  [70.58 kg (155 lb 9.6 oz)-72.122 kg (159 lb)] 70.58 kg (155 lb 9.6 oz) (02/24 0650) Weight  change:  Last BM Date: 05/26/13  Intake/Output from previous day: 02/23 0701 - 02/24 0700 In: -  Out: 200 [Urine:200]  PHYSICAL EXAM He is awake and alert. He looks acutely and chronically sick. His pupils are reactive. His nose and throat are clear. His mucous membranes are moist. He does not have JVD while he's sitting upright. His chest shows rhonchi and wheezing bilaterally. His heart is regular without gallop. His abdomen is soft without masses. Central nervous system examination is grossly intact  Lab Results: Basic Metabolic Panel:  Recent Labs  16/10/96 1445 05/27/13 0559  NA 139 143  K 4.0 5.1  CL 93* 97  CO2 36* 39*  GLUCOSE 203* 196*  BUN 28* 29*  CREATININE 1.46* 1.49*  CALCIUM 9.2 9.8   Liver Function Tests: No results found for this basename:  AST, ALT, ALKPHOS, BILITOT, PROT, ALBUMIN,  in the last 72 hours No results found for this basename: LIPASE, AMYLASE,  in the last 72 hours No results found for this basename: AMMONIA,  in the last 72 hours CBC:  Recent Labs  05/26/13 1445 05/27/13 0559  WBC 9.1 7.9  NEUTROABS 7.3  --   HGB 12.3* 11.5*  HCT 39.6 37.0*  MCV 95.7 95.9  PLT 329 338   Cardiac Enzymes: No results found for this basename: CKTOTAL, CKMB, CKMBINDEX, TROPONINI,  in the last 72 hours BNP:  Recent Labs  05/26/13 1445  PROBNP 10565.0*   D-Dimer: No results found for this basename: DDIMER,  in the last 72 hours CBG:  Recent Labs  05/26/13 2212 05/27/13 0732  GLUCAP 160* 185*   Hemoglobin A1C: No results found for this basename: HGBA1C,  in the last 72 hours Fasting Lipid Panel: No results found for this basename: CHOL, HDL, LDLCALC, TRIG, CHOLHDL, LDLDIRECT,  in the last 72 hours Thyroid Function Tests: No results found for this basename: TSH, T4TOTAL, FREET4, T3FREE, THYROIDAB,  in the last 72 hours Anemia Panel: No results found for this basename: VITAMINB12, FOLATE, FERRITIN, TIBC, IRON, RETICCTPCT,  in the last 72 hours Coagulation:  Recent Labs  05/26/13 1445 05/27/13 0559  LABPROT 32.1* 30.3*  INR 3.27* 3.03*   Urine Drug Screen: Drugs of Abuse  No results found for this basename: labopia, cocainscrnur, labbenz, amphetmu, thcu, labbarb    Alcohol Level: No results found for this basename: ETH,  in the last 72 hours Urinalysis: No results found for this basename: COLORURINE, APPERANCEUR, LABSPEC, PHURINE, GLUCOSEU, HGBUR, BILIRUBINUR, KETONESUR, PROTEINUR, UROBILINOGEN, NITRITE, LEUKOCYTESUR,  in the last 72 hours Misc. Labs:   ABGS: No results found for this basename: PHART, PCO2, PO2ART, TCO2, HCO3,  in the last 72 hours   MICROBIOLOGY: No results found for this or any previous visit (from the past 240 hour(s)).  Studies/Results: Dg Chest Portable 1 View  05/26/2013    CLINICAL DATA:  Shortness of breath, cough and congestion.  EXAM: PORTABLE CHEST - 1 VIEW  COMPARISON:  PA and lateral chest 05/15/2013.  CT chest 10/23/2013.  FINDINGS: The lungs are emphysematous but clear. There is cardiomegaly. Postsurgical change on the left is again identified.  IMPRESSION: No acute finding.  Stable compared to prior exams.   Electronically Signed   By: Drusilla Kanner M.D.   On: 05/26/2013 14:42    Medications:  Prior to Admission:  Prescriptions prior to admission  Medication Sig Dispense Refill  . acetaminophen (TYLENOL) 500 MG tablet Take 1,000 mg by mouth every 6 (six) hours as needed for moderate pain.       Marland Kitchen  Aclidinium Bromide (TUDORZA PRESSAIR) 400 MCG/ACT AEPB Inhale 1 puff into the lungs 2 (two) times daily.      Marland Kitchen. albuterol (PROVENTIL HFA;VENTOLIN HFA) 108 (90 BASE) MCG/ACT inhaler Inhale 2 puffs into the lungs every 6 (six) hours as needed for wheezing or shortness of breath.       Marland Kitchen. amiodarone (PACERONE) 200 MG tablet Take 1 tablet (200 mg total) by mouth daily.  30 tablet  6  . antiseptic oral rinse (BIOTENE) LIQD 15 mLs by Mouth Rinse route 4 (four) times daily as needed for dry mouth.      Marland Kitchen. arformoterol (BROVANA) 15 MCG/2ML NEBU Take 15 mcg by nebulization 2 (two) times daily.      . budesonide (PULMICORT) 0.25 MG/2ML nebulizer solution Take 0.25 mg by nebulization 2 (two) times daily.      . cloNIDine (CATAPRES) 0.1 MG tablet Take 1 tablet (0.1 mg total) by mouth 2 (two) times daily.      . clotrimazole (MYCELEX) 10 MG troche Take 10 mg by mouth daily.       . digoxin (LANOXIN) 0.125 MG tablet Take 0.125 mg by mouth every morning.      . furosemide (LASIX) 40 MG tablet Take 1 tablet (40 mg total) by mouth 2 (two) times daily. *takes an extra 40mg  tablet if needed for swelling  60 tablet  6  . levalbuterol (XOPENEX) 1.25 MG/0.5ML nebulizer solution Take 1.25 mg by nebulization every 6 (six) hours.  1 each  12  . linagliptin (TRADJENTA) 5 MG TABS tablet  Take 5 mg by mouth daily.      Marland Kitchen. losartan (COZAAR) 100 MG tablet Take 100 mg by mouth every morning.       . metoprolol (LOPRESSOR) 50 MG tablet Take 1.5 tablets (75 mg total) by mouth 2 (two) times daily.  90 tablet  6  . omeprazole (PRILOSEC) 20 MG capsule Take 20 mg by mouth 2 (two) times daily.       . potassium chloride (K-DUR) 10 MEQ tablet Take 1 tablet (10 mEq total) by mouth daily.  30 tablet  6  . prasugrel (EFFIENT) 10 MG TABS tablet Take 1 tablet (10 mg total) by mouth daily.  30 tablet  6  . predniSONE (DELTASONE) 10 MG tablet Take 1 tablet (10 mg total) by mouth See admin instructions. 30mg  a day for 2 days, then 20mg  a day for 2 days, then resume your 10mg  a day.      . ramipril (ALTACE) 2.5 MG capsule Take 2.5 mg by mouth daily.       . rosuvastatin (CRESTOR) 10 MG tablet Take 10 mg by mouth every evening.      . sodium chloride (OCEAN) 0.65 % nasal spray Place 1 spray into the nose every 6 (six) hours as needed for congestion.      Marland Kitchen. warfarin (COUMADIN) 2.5 MG tablet Take 2.5 mg by mouth daily at 6 PM.       . nitroGLYCERIN (NITROSTAT) 0.4 MG SL tablet Place 1 tablet (0.4 mg total) under the tongue every 5 (five) minutes x 3 doses as needed for chest pain.  25 tablet  3   Scheduled: . amiodarone  200 mg Oral Daily  . arformoterol  15 mcg Nebulization BID  . azithromycin  250 mg Oral QHS  . budesonide  0.25 mg Nebulization BID  . cloNIDine  0.1 mg Oral BID  . digoxin  0.125 mg Oral q morning - 10a  . furosemide  40 mg Oral BID  . ipratropium  0.5 mg Nebulization Q6H  . levalbuterol  1.25 mg Nebulization 4 times per day  . linagliptin  5 mg Oral Daily  . losartan  100 mg Oral Daily  . methylPREDNISolone (SOLU-MEDROL) injection  80 mg Intravenous Q12H  . metoprolol tartrate  75 mg Oral BID  . pantoprazole  40 mg Oral Daily  . potassium chloride  10 mEq Oral Daily  . prasugrel  10 mg Oral Daily  . ramipril  2.5 mg Oral Daily  . sodium chloride  3 mL Intravenous Q12H  .  warfarin  2.5 mg Oral q1800  . Warfarin - Physician Dosing Inpatient   Does not apply q1800   Continuous:  GEZ:MOQHUT chloride, acetaminophen, albuterol, antiseptic oral rinse, guaiFENesin-dextromethorphan, nitroGLYCERIN, sodium chloride  Assesment: He is admitted with COPD exacerbation. He has a very complicated situation with acute on chronic respiratory failure with hypoxia, systolic and diastolic CHF a right ventricular mural thrombus and severe right ventricular dysfunction. He has been on BiPAP at home. It was hoped that he would be a candidate for lung transplant but he is not at this point. Principal Problem:   COPD exacerbation Active Problems:   Coronary atherosclerosis of native coronary artery   RV (right ventricular) mural thrombus   Long term (current) use of anticoagulants   Secondary cardiomyopathy, unspecified   Pulmonary hypertension   Atrial fibrillation   PAT (paroxysmal atrial tachycardia)   Chronic respiratory failure   Acute on chronic respiratory failure with hypoxia   Chronic combined systolic and diastolic CHF (congestive heart failure)   Steroid-dependent COPD   Renal insufficiency, mild    Plan: Check CT chest.    LOS: 1 day   Michelyn Scullin L 05/27/2013, 9:15 AM

## 2013-05-27 NOTE — Progress Notes (Signed)
UR completed. Patient changed to inpatient- requiring IV solumedrol BID  

## 2013-05-27 NOTE — Consult Note (Signed)
Consulting cardiologist: Dr. Jonelle SidleSamuel G. Sharene [name]  Clinical Summary Danny [name] is a medically complex 58 y.o.male currently admitted to the hospital with COPD exacerbation and acute on chronic respiratory failure. He has been having progressive hypoxia with ambulation, recent cough and some pleuritic chest pain. No hemoptysis reported. No angina symptoms or progressive palpitations. He has had increased leg edema.   He was last seen in the cardiology office earlier in February following hospitalization with DES to the RCA, otherwise medical therapy for CAD and stabilization of PAF/PAT. He has significant chronic lung disease/COPD and was recently turned down for [facility] at [facility]. He is followed by Dr. Delton [name] and also Dr. Juanetta [name] from a pulmonary perspective.  Heart rate has been controlled recently, currently in sinus rhythm by ECG. INR is supratherapeutic at 3.0. Initial troponin I are his against ACS. Pro BNP is significantly elevated, although chest x-ray does not show pulmonary edema.   Allergies  Allergen Reactions  . Lipitor [Atorvastatin] Other (See Comments)    Muscle spasms     Medications Scheduled Medications: . Aclidinium Bromide  1 puff Inhalation BID  . amiodarone  200 mg Oral Daily  . arformoterol  15 mcg Nebulization BID  . azithromycin  250 mg Oral QHS  . budesonide  0.25 mg Nebulization BID  . cloNIDine  0.1 mg Oral BID  . digoxin  0.125 mg Oral q morning - 10a  . furosemide  40 mg Oral BID  . ipratropium  0.5 mg Nebulization Q6H  . levalbuterol  1.25 mg Nebulization 4 times per day  . linagliptin  5 mg Oral Daily  . losartan  100 mg Oral Daily  . methylPREDNISolone (SOLU-MEDROL) injection  80 mg Intravenous Q12H  . metoprolol tartrate  75 mg Oral BID  . pantoprazole  40 mg Oral Daily  . prasugrel  10 mg Oral Daily  . ramipril  2.5 mg Oral Daily  . sodium chloride  3 mL Intravenous Q12H  . warfarin  2.5 mg Oral q1800  . Warfarin - Physician Dosing  Inpatient   Does not apply q1800    PRN Medications: sodium chloride, acetaminophen, albuterol, antiseptic oral rinse, guaiFENesin-dextromethorphan, nitroGLYCERIN, sodium chloride   Past Medical History  Diagnosis Date  . GERD (gastroesophageal reflux disease)   . COPD (chronic obstructive pulmonary disease)     On home oxygen  . Chronic combined systolic and diastolic CHF (congestive heart failure)   . Essential hypertension, benign   . Cardiomyopathy     03/2013 Echo: EF 50-55%, Gr 1 DD, sev dil RV/RA with sev RV dysfxn, PASP 51mmHg. No evidence of RV thrombus.  . Right ventricular mural thrombus     On Coumadin  . Cor pulmonale     a. Severe RV dysfunction;  b. 04/2013 R heart cath: RA 17, RV 70/18 (22), PA 73/52.  . Coronary atherosclerosis of native coronary artery     a. 05/2012 MI/PCI: DES to [facility];  b. 04/2013 Cath/PCI: LM nl, LAD nonobs, LCX patent mid stent, RCA dom, 1288m (2.5x16 Promus DES).  Marland Kitchen. PAF (paroxysmal atrial fibrillation)     On amiodarone  . Spontaneous pneumothorax 2002    Bilateral  . PAT (paroxysmal atrial tachycardia)   . Mixed hyperlipidemia   . History of pneumonia   . Type II diabetes mellitus   . CKD (chronic kidney disease) stage 2, GFR 60-89 ml/min     Past Surgical History  Procedure Laterality Date  . Lung  removal, partial Bilateral 04/26/2000    Bullectomy  . Coronary angioplasty with stent placement  04/2012; 04/2013    Danny [name], Danny [name] ("1"); "1; total of 2 now" (05/16/2013)  . Pilonidal cyst excision  ~ 1990    No family history on file.  Social History Danny [name] reports that he quit smoking about 19 years ago. His smoking use included Cigarettes. He has a 50 pack-year smoking history. He has never used smokeless tobacco. Danny [name] reports that he drinks alcohol.  Review of Systems No fevers or chills. Appetite fair. Otherwise as outlined above.  Physical Examination Blood pressure 109/70, pulse 72, temperature 98.6 F  (37 C), temperature source Oral, resp. rate 20, height 5\' 7"  (1.702 m), weight 155 lb 9.6 oz (70.58 kg), SpO2 93.00%.  Intake/Output Summary (Last 24 hours) at 05/27/13 1018 Last data filed at 05/27/13 0751  Gross per 24 hour  Intake      0 ml  Output    350 ml  Net   -350 ml   Chronically ill-appearing male, no distress wearing oxygen via nasal cannula. HEENT: Conjunctiva and lids normal, oropharynx clear.  Neck: Supple, no elevated JVP or carotid bruits, no thyromegaly.  Lungs: Decreased breath sounds, no wheezing, nonlabored breathing at rest.  Cardiac: Regular rate and rhythm, indistinct PMI, no S3 or significant systolic murmur, no pericardial rub.  Abdomen: Soft, nontender, bowel sounds present.  Extremities: 2+ edema, distal pulses 2+.  Skin: Warm and dry.  Musculoskeletal: No kyphosis.  Neuropsychiatric: Alert and oriented x3, affect grossly appropriate.   Lab Results  Basic Metabolic Panel:  Recent Labs Lab 05/26/13 1445 05/27/13 0559  NA 139 143  K 4.0 5.1  CL 93* 97  CO2 36* 39*  GLUCOSE 203* 196*  BUN 28* 29*  CREATININE 1.46* 1.49*  CALCIUM 9.2 9.8    CBC:  Recent Labs Lab 05/26/13 1445 05/27/13 0559  WBC 9.1 7.9  NEUTROABS 7.3  --   HGB 12.3* 11.5*  HCT 39.6 37.0*  MCV 95.7 95.9  PLT 329 338    Cardiac Enzymes: POC troponin I 0.05.  Pro-BNP: 10565  ECG Normal sinus rhythm with rightward axis and pulmonary disease pattern. No acute ST segment changes.  Imaging PORTABLE CHEST - 1 VIEW  COMPARISON: PA and lateral chest 05/15/2013. CT chest 10/23/2013.  FINDINGS: The lungs are emphysematous but clear. There is cardiomegaly. Postsurgical change on the left is again identified.  IMPRESSION: No acute finding. Stable compared to prior exams.   Impression  1. Cor pulmonale with known severe RV dysfunction. Leg edema is noted. He has been compliant with oral diuretic at home.  2. COPD exacerbation with progressive respiratory  failure. Currently being managed by Dr. Juanetta Gosling and Dr. Delton Coombes. He was turned down for transplant at [facility].  3. Coronary artery disease as outlined above, most recently status post DES to the RCA with documentation of patent circumflex stent and left coronary system, LVEF 50-55%. He continues on Effient (no ASA since on Coumadin as well).  4. Known RV thrombus, on chronic Coumadin.  5. PAF and PAT, currently well controlled on amiodarone.  6. 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   Recommendations  From a cardiac perspective, would continue current regimen with the exception of converting Lasix to IV formulation. No clear evidence of ACS at this time, and rhythm has been relatively stable. No further cardiac testing is anticipated now. We will continue to follow with you.   Danny Sidle, M.D., F.A.C.C.

## 2013-05-27 NOTE — Progress Notes (Signed)
[id number] 

## 2013-05-27 NOTE — Progress Notes (Signed)
Inpatient Diabetes Program Recommendations  AACE/ADA: New Consensus Statement on Inpatient Glycemic Control (2013)  Target Ranges:  Prepandial:   less than 140 mg/dL      Peak postprandial:   less than 180 mg/dL (1-2 hours)      Critically ill patients:  140 - 180 mg/dL   Results for CHANAN, ABILA (MRN [id number]) as of 05/27/2013 08:16  Ref. Range 05/26/2013 14:45 05/27/2013 05:59  Glucose Latest Range: 70-99 mg/dL 875 (H) 643 (H)   Diabetes history: DM2 Outpatient Diabetes medications: Tradjenta 5 mg daily Current orders for Inpatient glycemic control: Tradjenta 5 mg daily  Inpatient Diabetes Program Recommendations Correction (SSI): [facility] inpatient and receiving steroids, please consider ordering Novolog correction scale Q4H (if remains NPO) or ACHS (if diet ordered). HgbA1C: Please consider ordering an A1C to evaluate glycemic control over the past 2-3 months.  Thanks, Orlando Penner, RN, MSN, CCRN Diabetes Coordinator Inpatient Diabetes Program [phone] (Team Pager)  [phone] ([facility] office) [phone] [facility] office)

## 2013-05-27 NOTE — Progress Notes (Signed)
NAMEAAKARSH, Danny [name]                ACCOUNT NO.:  [id number]  MEDICAL RECORD NO.:  [id number]  LOCATION:  A339                          FACILITY:  [facility]  PHYSICIAN:  Melvyn Novas, MDDATE OF BIRTH:  Feb 11, 1956  DATE OF PROCEDURE: DATE OF DISCHARGE:                                PROGRESS NOTE   The patient has history of atrial fibrillation, ischemic right-sided infarct, right ventricular mural thrombus, on anticoagulation with Coumadin, cor pulmonale, COPD, elevated proBNP.  Previous echo of December 2014 reveals left ventricular EF of 50% to 55%, grade 1 diastolic dysfunction and severe dilatation of right ventricle and right atrium.  The patient had left circumflex stent with right-sided dominant anatomy, currently in sinus rhythm.  Chest x-ray reveals emphysematous changes.  No evidence of congestive heart failure.  He likewise has type 2 diabetes.  The patient complains of increasing coughs, increasing dyspnea despite nasal O2.  States he had a nuclear exercise test a week ago, which was reported as good, I do not know the details of this study.  The patient is admitted with exacerbation of dyspnea and desaturation.  PHYSICAL EXAMINATION:  VITAL SIGNS:  Blood pressure is 128/72, respiratory rate is 18. NECK:  No JVD.  No carotid bruits.  No thyromegaly or thyroid bruits. LUNGS:  Clear to A and P.  Diminished breath sounds in the bases.  No rales appreciable.  There is bilateral prolonged inspiratory and expiratory phases. HEART:  Regular rhythm.  No S3, S4 auscultated.  No heaves, thrills, or rubs. EXTREMITIES:  Trace to 1+ pedal edema.  PLAN:  Right now is to obtain Pulmonary consultation for optimization of dyspnea and cor pulmonale.  To obtain Cardiology consultation for the same above measures.  Maintain Coumadin anticoagulation as is currently ordered.  We will defer to steroid usage depending on Pulmonary recommendations.     Melvyn Novas,  MD     RMD/MEDQ  D:  05/27/2013  T:  05/27/2013  Job:  [id number]

## 2013-05-28 ENCOUNTER — Encounter (HOSPITAL_COMMUNITY): Payer: Managed Care, Other (non HMO)

## 2013-05-28 DIAGNOSIS — I279 Pulmonary heart disease, unspecified: Secondary | ICD-10-CM

## 2013-05-28 DIAGNOSIS — I10 Essential (primary) hypertension: Secondary | ICD-10-CM

## 2013-05-28 DIAGNOSIS — I471 Supraventricular tachycardia: Secondary | ICD-10-CM

## 2013-05-28 DIAGNOSIS — I429 Cardiomyopathy, unspecified: Secondary | ICD-10-CM

## 2013-05-28 LAB — BASIC METABOLIC PANEL
BUN: 32 mg/dL — ABNORMAL HIGH (ref 6–23)
CO2: 40 mEq/L (ref 19–32)
Calcium: 9.8 mg/dL (ref 8.4–10.5)
Chloride: 97 mEq/L (ref 96–112)
Creatinine, Ser: 1.55 mg/dL — ABNORMAL HIGH (ref 0.50–1.35)
GFR calc non Af Amer: 48 mL/min — ABNORMAL LOW (ref >90)
GFR, EST AFRICAN AMERICAN: 56 mL/min — AB (ref >90)
Glucose, Bld: 189 mg/dL — ABNORMAL HIGH (ref 70–99)
POTASSIUM: 4.4 meq/L (ref 3.7–5.3)
Sodium: 144 mEq/L (ref 137–147)

## 2013-05-28 LAB — GLUCOSE, CAPILLARY
Glucose-Capillary: 203 mg/dL — ABNORMAL HIGH (ref 70–99)
Glucose-Capillary: 211 mg/dL — ABNORMAL HIGH (ref 70–99)
Glucose-Capillary: 184 mg/dL — ABNORMAL HIGH (ref 70–99)
Glucose-Capillary: 217 mg/dL — ABNORMAL HIGH (ref 70–99)

## 2013-05-28 LAB — PROTIME-INR
INR: 3.12 — ABNORMAL HIGH (ref 0.00–1.49)
PROTHROMBIN TIME: 31 s — AB (ref 11.6–15.2)

## 2013-05-28 LAB — BLOOD GAS, ARTERIAL
ACID-BASE EXCESS: 9.6 mmol/L — AB (ref 0.0–2.0)
Bicarbonate: 34.9 mEq/L — ABNORMAL HIGH (ref 20.0–24.0)
DRAWN BY: 25788
O2 Content: 4 L/min
O2 Saturation: 78.4 %
PCO2 ART: 60.2 mmHg — AB (ref 35.0–45.0)
PH ART: 7.381 (ref 7.350–7.450)
PO2 ART: 54.2 mmHg — AB (ref 80.0–100.0)
Patient temperature: 37
TCO2: 32.1 mmol/L (ref 0–100)

## 2013-05-28 NOTE — Discharge Planning (Addendum)
Pt is COPD Gold - Folder and [employer] was previously given at [employer] - per pt

## 2013-05-28 NOTE — Progress Notes (Signed)
Subjective: He says he is still short of breath. He is still concerned about the episodes of hypoxia that he has. He thinks he may have elevated PCO2 and is requesting a blood gas.  Objective: Vital signs in last 24 hours: Temp:  [97.3 F (36.3 C)-98 F (36.7 C)] 97.3 F (36.3 C) (02/25 0500) Pulse Rate:  [58-78] 58 (02/25 0500) Resp:  [20] 20 (02/25 0500) BP: (98-116)/(53-78) 116/78 mmHg (02/25 0500) SpO2:  [86 %-100 %] 100 % (02/25 0500) Weight:  [71.578 kg (157 lb 12.8 oz)] 71.578 kg (157 lb 12.8 oz) (02/25 0500) Weight change: -0.544 kg (-1 lb 3.2 oz) Last BM Date: 05/27/13  Intake/Output from previous day: 02/24 0701 - 02/25 0700 In: 720 [P.O.:720] Out: 750 [Urine:750]  PHYSICAL EXAM General appearance: alert, cooperative and He looks mildly dyspneic at rest Resp: rhonchi bilaterally Cardio: regular rate and rhythm, S1, S2 normal, no murmur, click, rub or gallop GI: soft, non-tender; bowel sounds normal; no masses,  no organomegaly Extremities: edema 2+  Lab Results:    Basic Metabolic Panel:  Recent Labs  16/10/96 0559 05/28/13 0628  NA 143 144  K 5.1 4.4  CL 97 97  CO2 39* 40*  GLUCOSE 196* 189*  BUN 29* 32*  CREATININE 1.49* 1.55*  CALCIUM 9.8 9.8   Liver Function Tests: No results found for this basename: AST, ALT, ALKPHOS, BILITOT, PROT, ALBUMIN,  in the last 72 hours No results found for this basename: LIPASE, AMYLASE,  in the last 72 hours No results found for this basename: AMMONIA,  in the last 72 hours CBC:  Recent Labs  05/26/13 1445 05/27/13 0559  WBC 9.1 7.9  NEUTROABS 7.3  --   HGB 12.3* 11.5*  HCT 39.6 37.0*  MCV 95.7 95.9  PLT 329 338   Cardiac Enzymes: No results found for this basename: CKTOTAL, CKMB, CKMBINDEX, TROPONINI,  in the last 72 hours BNP:  Recent Labs  05/26/13 1445  PROBNP 10565.0*   D-Dimer: No results found for this basename: DDIMER,  in the last 72 hours CBG:  Recent Labs  05/26/13 2212  05/27/13 0732 05/27/13 1211 05/27/13 1648 05/27/13 2025 05/28/13 0713  GLUCAP 160* 185* 312* 102* 204* 203*   Hemoglobin A1C: No results found for this basename: HGBA1C,  in the last 72 hours Fasting Lipid Panel: No results found for this basename: CHOL, HDL, LDLCALC, TRIG, CHOLHDL, LDLDIRECT,  in the last 72 hours Thyroid Function Tests: No results found for this basename: TSH, T4TOTAL, FREET4, T3FREE, THYROIDAB,  in the last 72 hours Anemia Panel: No results found for this basename: VITAMINB12, FOLATE, FERRITIN, TIBC, IRON, RETICCTPCT,  in the last 72 hours Coagulation:  Recent Labs  05/27/13 0559 05/28/13 0628  LABPROT 30.3* 31.0*  INR 3.03* 3.12*   Urine Drug Screen: Drugs of Abuse  No results found for this basename: labopia, cocainscrnur, labbenz, amphetmu, thcu, labbarb    Alcohol Level: No results found for this basename: ETH,  in the last 72 hours Urinalysis: No results found for this basename: COLORURINE, APPERANCEUR, LABSPEC, PHURINE, GLUCOSEU, HGBUR, BILIRUBINUR, KETONESUR, PROTEINUR, UROBILINOGEN, NITRITE, LEUKOCYTESUR,  in the last 72 hours Misc. Labs:  ABGS No results found for this basename: PHART, PCO2, PO2ART, TCO2, HCO3,  in the last 72 hours CULTURES No results found for this or any previous visit (from the past 240 hour(s)). Studies/Results: Ct Chest W Contrast  05/27/2013   CLINICAL DATA:  COPD.  Diabetes and shortness of Breath  EXAM: CT CHEST WITH CONTRAST  TECHNIQUE: Multidetector CT imaging of the chest was performed during intravenous contrast administration.  CONTRAST:  80mL OMNIPAQUE IOHEXOL 300 MG/ML  SOLN  COMPARISON:  05/26/2013  FINDINGS: There is no pleural effusion identified. No airspace consolidation or atelectasis identified. There are advanced changes of centrilobular emphysema identified. Bilateral areas of scarring identified. Previously indexed left upper lobe nodule has resolved in the interval.  The heart size is moderately  enlarged. No pericardial effusion identified. Calcification involving the left circumflex coronary artery noted. 1.1 cm right paratracheal lymph node is unchanged from previous exam, image 19/series 2. The sub- carinal lymph node measures 1.2 cm, image 25/series 2. No progressive mediastinal or hilar adenopathy identified.  Incidental imaging through the upper abdomen shows no acute findings.  Review of the visualized osseous structures is significant for multiple chronic healed right posterior rib fracture deformities  IMPRESSION: 1. No acute findings identified. 2. Emphysema and prior granulomatous disease. 3. Previous left upper lobe nodule has resolved in the interval. 4. No change in borderline enlarged mediastinal lymph nodes. The largest lymph node is in the sub- carinal region measuring 1.2 cm. 5. Cardiac enlargement and coronary artery calcifications.   Electronically Signed   By: Signa Kell M.D.   On: 05/27/2013 14:17   Dg Chest Portable 1 View  05/26/2013   CLINICAL DATA:  Shortness of breath, cough and congestion.  EXAM: PORTABLE CHEST - 1 VIEW  COMPARISON:  PA and lateral chest 05/15/2013.  CT chest 10/23/2013.  FINDINGS: The lungs are emphysematous but clear. There is cardiomegaly. Postsurgical change on the left is again identified.  IMPRESSION: No acute finding.  Stable compared to prior exams.   Electronically Signed   By: Drusilla Kanner M.D.   On: 05/26/2013 14:42    Medications:  Prior to Admission:  Prescriptions prior to admission  Medication Sig Dispense Refill  . acetaminophen (TYLENOL) 500 MG tablet Take 1,000 mg by mouth every 6 (six) hours as needed for moderate pain.       . Aclidinium Bromide (TUDORZA PRESSAIR) 400 MCG/ACT AEPB Inhale 1 puff into the lungs 2 (two) times daily.      Marland Kitchen albuterol (PROVENTIL HFA;VENTOLIN HFA) 108 (90 BASE) MCG/ACT inhaler Inhale 2 puffs into the lungs every 6 (six) hours as needed for wheezing or shortness of breath.       Marland Kitchen amiodarone  (PACERONE) 200 MG tablet Take 1 tablet (200 mg total) by mouth daily.  30 tablet  6  . antiseptic oral rinse (BIOTENE) LIQD 15 mLs by Mouth Rinse route 4 (four) times daily as needed for dry mouth.      Marland Kitchen arformoterol (BROVANA) 15 MCG/2ML NEBU Take 15 mcg by nebulization 2 (two) times daily.      . budesonide (PULMICORT) 0.25 MG/2ML nebulizer solution Take 0.25 mg by nebulization 2 (two) times daily.      . cloNIDine (CATAPRES) 0.1 MG tablet Take 1 tablet (0.1 mg total) by mouth 2 (two) times daily.      . clotrimazole (MYCELEX) 10 MG troche Take 10 mg by mouth daily.       . digoxin (LANOXIN) 0.125 MG tablet Take 0.125 mg by mouth every morning.      . furosemide (LASIX) 40 MG tablet Take 1 tablet (40 mg total) by mouth 2 (two) times daily. *takes an extra 40mg  tablet if needed for swelling  60 tablet  6  . levalbuterol (XOPENEX) 1.25 MG/0.5ML nebulizer solution Take 1.25 mg by nebulization every 6 (six) hours.  1 each  12  . linagliptin (TRADJENTA) 5 MG TABS tablet Take 5 mg by mouth daily.      Marland Kitchen. losartan (COZAAR) 100 MG tablet Take 100 mg by mouth every morning.       . metoprolol (LOPRESSOR) 50 MG tablet Take 1.5 tablets (75 mg total) by mouth 2 (two) times daily.  90 tablet  6  . omeprazole (PRILOSEC) 20 MG capsule Take 20 mg by mouth 2 (two) times daily.       . potassium chloride (K-DUR) 10 MEQ tablet Take 1 tablet (10 mEq total) by mouth daily.  30 tablet  6  . prasugrel (EFFIENT) 10 MG TABS tablet Take 1 tablet (10 mg total) by mouth daily.  30 tablet  6  . predniSONE (DELTASONE) 10 MG tablet Take 1 tablet (10 mg total) by mouth See admin instructions. 30mg  a day for 2 days, then 20mg  a day for 2 days, then resume your 10mg  a day.      . ramipril (ALTACE) 2.5 MG capsule Take 2.5 mg by mouth daily.       . rosuvastatin (CRESTOR) 10 MG tablet Take 10 mg by mouth every evening.      . sodium chloride (OCEAN) 0.65 % nasal spray Place 1 spray into the nose every 6 (six) hours as needed for  congestion.      Marland Kitchen. warfarin (COUMADIN) 2.5 MG tablet Take 2.5 mg by mouth daily at 6 PM.       . nitroGLYCERIN (NITROSTAT) 0.4 MG SL tablet Place 1 tablet (0.4 mg total) under the tongue every 5 (five) minutes x 3 doses as needed for chest pain.  25 tablet  3   Scheduled: Marland Kitchen. Aclidinium Bromide  1 puff Inhalation BID  . amiodarone  200 mg Oral Daily  . arformoterol  15 mcg Nebulization BID  . azithromycin  250 mg Oral QHS  . budesonide  0.25 mg Nebulization BID  . cloNIDine  0.1 mg Oral BID  . digoxin  0.125 mg Oral q morning - 10a  . furosemide  40 mg Intravenous BID  . insulin aspart  0-15 Units Subcutaneous TID WC  . insulin aspart  0-5 Units Subcutaneous QHS  . ipratropium  0.5 mg Nebulization Q6H  . levalbuterol  1.25 mg Nebulization 4 times per day  . linagliptin  5 mg Oral Daily  . losartan  100 mg Oral Daily  . methylPREDNISolone (SOLU-MEDROL) injection  80 mg Intravenous Q12H  . metoprolol tartrate  75 mg Oral BID  . pantoprazole  40 mg Oral Daily  . prasugrel  10 mg Oral Daily  . ramipril  2.5 mg Oral Daily  . sodium chloride  3 mL Intravenous Q12H  . warfarin  2.5 mg Oral q1800  . Warfarin - Physician Dosing Inpatient   Does not apply q1800   Continuous:  HQI:ONGEXBPRN:sodium chloride, acetaminophen, albuterol, antiseptic oral rinse, guaiFENesin-dextromethorphan, nitroGLYCERIN, sodium chloride  Assesment: He was admitted with COPD exacerbation. He has acute on chronic respiratory failure. He has been on BiPAP at home and the settings are being adjusted. Although he tolerates everything fairly well he has multiple severe medical problems including coronary artery occlusive disease cor pulmonale paroxysmal atrial fibrillation combined systolic and diastolic CHF and renal insufficiency. He is also diabetic which is a fairly new problem and probably related to the amount of steroid she's had to take for his COPD Principal Problem:   COPD exacerbation Active Problems:   Coronary  atherosclerosis of native  coronary artery   RV (right ventricular) mural thrombus   Long term (current) use of anticoagulants   COPD (chronic obstructive pulmonary disease)   Secondary cardiomyopathy, unspecified   Pulmonary hypertension   Atrial fibrillation   PAT (paroxysmal atrial tachycardia)   Chronic respiratory failure   Acute on chronic respiratory failure with hypoxia   Chronic combined systolic and diastolic CHF (congestive heart failure)   Steroid-dependent COPD   Renal insufficiency, mild    Plan: he will have a blood gas and see if his PCO2 is still elevated    LOS: 2 days   Kenston Longton L 05/28/2013, 8:32 AM

## 2013-05-28 NOTE — Plan of Care (Addendum)
Paged DR. Hawkins with critical labs: 60.2 PCO2 (critical), 7.38 PH, 54.2 PO2, 78.4 Sat, and 34.9 Bicarb.  Resp placing pt on mini-mask. Waiting on return call.  Dr. Bluford Main pt is basically asymptomatic and at baseline for now - no further orders at this time.

## 2013-05-28 NOTE — Progress Notes (Signed)
The patient was seen and examined, and I agree with the assessment and plan as documented above, with modifications as noted below. Appears to be stable from a cardiovascular perspective, with known cor pulmonale and severe RV dysfunction.  Can restart oral Lasix as taking at home on 2/26. I also encouraged the use of compression stockings for chronic leg swelling. No further recommendations. Will sign off for now. Please call with questions.

## 2013-05-28 NOTE — Progress Notes (Signed)
Subjective:  Complains of shortness of breath with little activity.  Objective:  Vital Signs in the last 24 hours: Temp:  [97.3 F (36.3 C)-98 F (36.7 C)] 97.3 F (36.3 C) (02/25 0500) Pulse Rate:  [58-78] 64 (02/25 0840) Resp:  [20] 20 (02/25 0500) BP: (98-121)/(53-82) 121/82 mmHg (02/25 0840) SpO2:  [86 %-100 %] 100 % (02/25 0500) Weight:  [157 lb 12.8 oz (71.578 kg)] 157 lb 12.8 oz (71.578 kg) (02/25 0500)  Intake/Output from previous day: 02/24 0701 - 02/25 0700 In: 720 [P.O.:720] Out: 750 [Urine:750] Intake/Output from this shift: Total I/O In: -  Out: 250 [Urine:250]  Physical Exam: NECK: Without JVD, HJR, or bruit LUNGS: Decreased breath sounds throughout without rales. HEART: Regular rate and rhythm, no murmur, gallop, rub, bruit, thrill, or heave EXTREMITIES: Without cyanosis, clubbing, or edema   Lab Results:  Recent Labs  05/26/13 1445 05/27/13 0559  WBC 9.1 7.9  HGB 12.3* 11.5*  PLT 329 338    Recent Labs  05/27/13 0559 05/28/13 0628  NA 143 144  K 5.1 4.4  CL 97 97  CO2 39* 40*  GLUCOSE 196* 189*  BUN 29* 32*  CREATININE 1.49* 1.55*   No results found for this basename: TROPONINI, CK, MB,  in the last 72 hours Hepatic Function Panel No results found for this basename: PROT, ALBUMIN, AST, ALT, ALKPHOS, BILITOT, BILIDIR, IBILI,  in the last 72 hours No results found for this basename: CHOL,  in the last 72 hours No results found for this basename: PROTIME,  in the last 72 hours  Imaging: CT scan:05/27/13: FINDINGS: There is no pleural effusion identified. No airspace consolidation or atelectasis identified. There are advanced changes of centrilobular emphysema identified. Bilateral areas of scarring identified. Previously indexed left upper lobe nodule has resolved in the interval.  The heart size is moderately enlarged. No pericardial effusion identified. Calcification involving the left circumflex coronary artery noted. 1.1 cm right  paratracheal lymph node is unchanged from previous exam, image 19/series 2. The sub- carinal lymph node measures 1.2 cm, image 25/series 2. No progressive mediastinal or hilar adenopathy identified.  Incidental imaging through the upper abdomen shows no acute findings.  Review of the visualized osseous structures is significant for multiple chronic healed right posterior rib fracture deformities  IMPRESSION: 1. No acute findings identified. 2. Emphysema and prior granulomatous disease. 3. Previous left upper lobe nodule has resolved in the interval. 4. No change in borderline enlarged mediastinal lymph nodes. The largest lymph node is in the sub- carinal region measuring 1.2 cm. 5. Cardiac enlargement and coronary artery calcifications.    Cardiac Studies: Myoview 05/17/13: IMPRESSION: 1. No scintigraphic evidence of prior infarction or pharmacologically induced ischemia. 2. Normal wall motion.  Ejection fraction -79%.   Electronically Signed   By: Simonne ComeJohn  Watts M.D.   On: 05/17/2013 13:54  Assessment/Plan:  1. Cor pulmonale with known severe RV dysfunction. Leg edema is noted. He has been compliant with oral diuretic at home.BNP elevated so on IV Lasix now. Consider changing back to po today with Crt going up 1.55.  2. COPD exacerbation with progressive respiratory failure. Currently being managed by Dr. Juanetta GoslingHawkins and Dr. Delton CoombesByrum. [facility]  3. Coronary artery disease most recently status post DES to the RCA 04/23/13 with documentation of patent circumflex stent(04/2012) and left coronary system, LVEF 50-55%. He continues on Effient (no ASA since on Coumadin as well). Negative myoview 05/17/13  4. Known RV thrombus, on  chronic Coumadin.  5. PAF and PAT, currently well controlled on amiodarone.  6. 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       LOS: 2 days    Jacolyn Reedy 05/28/2013, 8:50 AM

## 2013-05-28 NOTE — Progress Notes (Signed)
[id number] 

## 2013-05-29 LAB — GLUCOSE, CAPILLARY
GLUCOSE-CAPILLARY: 143 mg/dL — AB (ref 70–99)
Glucose-Capillary: 164 mg/dL — ABNORMAL HIGH (ref 70–99)
Glucose-Capillary: 377 mg/dL — ABNORMAL HIGH (ref 70–99)
Glucose-Capillary: 123 mg/dL — ABNORMAL HIGH (ref 70–99)

## 2013-05-29 LAB — BASIC METABOLIC PANEL
BUN: 39 mg/dL — ABNORMAL HIGH (ref 6–23)
CHLORIDE: 95 meq/L — AB (ref 96–112)
CO2: 40 mEq/L (ref 19–32)
Calcium: 9.9 mg/dL (ref 8.4–10.5)
Creatinine, Ser: 1.5 mg/dL — ABNORMAL HIGH (ref 0.50–1.35)
GFR calc non Af Amer: 50 mL/min — ABNORMAL LOW (ref >90)
GFR, EST AFRICAN AMERICAN: 58 mL/min — AB (ref >90)
Glucose, Bld: 175 mg/dL — ABNORMAL HIGH (ref 70–99)
Potassium: 4.5 mEq/L (ref 3.7–5.3)
SODIUM: 142 meq/L (ref 137–147)

## 2013-05-29 LAB — PROTIME-INR
INR: 3.07 — AB (ref 0.00–1.49)
Prothrombin Time: 30.6 seconds — ABNORMAL HIGH (ref 11.6–15.2)

## 2013-05-29 MED ORDER — FUROSEMIDE 10 MG/ML IJ SOLN
40.0000 mg | Freq: Once | INTRAMUSCULAR | Status: AC
  Administered 2013-05-29: 40 mg via INTRAVENOUS
  Filled 2013-05-29: qty 4

## 2013-05-29 NOTE — Plan of Care (Addendum)
Pt complaining of diff breathing and venti mask being "too powerful."  Pt was requesting to see resp.  Resp called and asked to come to room.  SPO2 96% on 40% 15L venti mask.  Pt shows no distress and is asymptomatic, other than anxiety. Pt lower to 12L 40% on venti mask and SPO2 was 97%.  Pt stated that he feels a lot more comfortable at 12L. Still waiting on resp. (1035)

## 2013-05-29 NOTE — Progress Notes (Signed)
Inpatient Diabetes Program Recommendations  AACE/ADA: New Consensus Statement on Inpatient Glycemic Control (2013)  Target Ranges:  Prepandial:   less than 140 mg/dL      Peak postprandial:   less than 180 mg/dL (1-2 hours)      Critically ill patients:  140 - 180 mg/dL   Diabetes history: DM2  Outpatient Diabetes medications: Tradjenta 5 mg daily  Current orders for Inpatient glycemic control: Tradjenta 5 mg daily, Novolog 0-15 units AC, Novolog 0-5 units HS  Inpatient Diabetes Program Recommendations Insulin - Meal Coverage: Please consider ordering Novolog 5 units TID with meals for meal coverage while on steroids. HgbA1C: Please consider ordering an A1C to evaluate glycemic control over the past 2-3 months.  Thanks, Orlando Penner, RN, MSN, CCRN Diabetes Coordinator Inpatient Diabetes Program [phone] (Team Pager) [phone] ([facility] office) [phone] [facility] office)

## 2013-05-29 NOTE — Progress Notes (Signed)
Subjective: He said he had a bad night last night and thinks he was having trouble with his inhaler from home causing him to be dried out. He has no other new complaints except he got some fluid in his legs now.  Objective: Vital signs in last 24 hours: Temp:  [97.9 F (36.6 C)] 97.9 F (36.6 C) (02/26 0500) Pulse Rate:  [71-82] 82 (02/26 0840) Resp:  [21] 21 (02/25 2100) BP: (122-129)/(61-79) 128/74 mmHg (02/26 0840) SpO2:  [86 %-100 %] 91 % (02/26 0657) FiO2 (%):  [40 %-50 %] 40 % (02/26 0657) Weight:  [72.8 kg (160 lb 7.9 oz)] 72.8 kg (160 lb 7.9 oz) (02/26 0500) Weight change: 1.222 kg (2 lb 11.1 oz) Last BM Date: 05/27/13  Intake/Output from previous day: 02/25 0701 - 02/26 0700 In: 960 [P.O.:960] Out: 1075 [Urine:1075]  PHYSICAL EXAM General appearance: He looks chronically sick and he is tired this morning Resp: rhonchi bilaterally Cardio: His heart is regular via exam. GI: soft, non-tender; bowel sounds normal; no masses,  no organomegaly Extremities: 1-2+ edema  Lab Results:    Basic Metabolic Panel:  Recent Labs  73/41/93 0628 05/29/13 0620  NA 144 142  K 4.4 4.5  CL 97 95*  CO2 40* 40*  GLUCOSE 189* 175*  BUN 32* 39*  CREATININE 1.55* 1.50*  CALCIUM 9.8 9.9   Liver Function Tests: No results found for this basename: AST, ALT, ALKPHOS, BILITOT, PROT, ALBUMIN,  in the last 72 hours No results found for this basename: LIPASE, AMYLASE,  in the last 72 hours No results found for this basename: AMMONIA,  in the last 72 hours CBC:  Recent Labs  05/26/13 1445 05/27/13 0559  WBC 9.1 7.9  NEUTROABS 7.3  --   HGB 12.3* 11.5*  HCT 39.6 37.0*  MCV 95.7 95.9  PLT 329 338   Cardiac Enzymes: No results found for this basename: CKTOTAL, CKMB, CKMBINDEX, TROPONINI,  in the last 72 hours BNP:  Recent Labs  05/26/13 1445  PROBNP 10565.0*   D-Dimer: No results found for this basename: DDIMER,  in the last 72 hours CBG:  Recent Labs  05/27/13 2025  05/28/13 0713 05/28/13 1126 05/28/13 1642 05/28/13 2053 05/29/13 0839  GLUCAP 204* 203* 211* 184* 217* 164*   Hemoglobin A1C: No results found for this basename: HGBA1C,  in the last 72 hours Fasting Lipid Panel: No results found for this basename: CHOL, HDL, LDLCALC, TRIG, CHOLHDL, LDLDIRECT,  in the last 72 hours Thyroid Function Tests: No results found for this basename: TSH, T4TOTAL, FREET4, T3FREE, THYROIDAB,  in the last 72 hours Anemia Panel: No results found for this basename: VITAMINB12, FOLATE, FERRITIN, TIBC, IRON, RETICCTPCT,  in the last 72 hours Coagulation:  Recent Labs  05/28/13 0628 05/29/13 0620  LABPROT 31.0* 30.6*  INR 3.12* 3.07*   Urine Drug Screen: Drugs of Abuse  No results found for this basename: labopia, cocainscrnur, labbenz, amphetmu, thcu, labbarb    Alcohol Level: No results found for this basename: ETH,  in the last 72 hours Urinalysis: No results found for this basename: COLORURINE, APPERANCEUR, LABSPEC, PHURINE, GLUCOSEU, HGBUR, BILIRUBINUR, KETONESUR, PROTEINUR, UROBILINOGEN, NITRITE, LEUKOCYTESUR,  in the last 72 hours Misc. Labs:  ABGS  Recent Labs  05/28/13 0914  PHART 7.381  PO2ART 54.2*  TCO2 32.1  HCO3 34.9*   CULTURES No results found for this or any previous visit (from the past 240 hour(s)). Studies/Results: Ct Chest W Contrast  05/27/2013   CLINICAL DATA:  COPD.  Diabetes and shortness of Breath  EXAM: CT CHEST WITH CONTRAST  TECHNIQUE: Multidetector CT imaging of the chest was performed during intravenous contrast administration.  CONTRAST:  80mL OMNIPAQUE IOHEXOL 300 MG/ML  SOLN  COMPARISON:  05/26/2013  FINDINGS: There is no pleural effusion identified. No airspace consolidation or atelectasis identified. There are advanced changes of centrilobular emphysema identified. Bilateral areas of scarring identified. Previously indexed left upper lobe nodule has resolved in the interval.  The heart size is moderately enlarged.  No pericardial effusion identified. Calcification involving the left circumflex coronary artery noted. 1.1 cm right paratracheal lymph node is unchanged from previous exam, image 19/series 2. The sub- carinal lymph node measures 1.2 cm, image 25/series 2. No progressive mediastinal or hilar adenopathy identified.  Incidental imaging through the upper abdomen shows no acute findings.  Review of the visualized osseous structures is significant for multiple chronic healed right posterior rib fracture deformities  IMPRESSION: 1. No acute findings identified. 2. Emphysema and prior granulomatous disease. 3. Previous left upper lobe nodule has resolved in the interval. 4. No change in borderline enlarged mediastinal lymph nodes. The largest lymph node is in the sub- carinal region measuring 1.2 cm. 5. Cardiac enlargement and coronary artery calcifications.   Electronically Signed   By: Signa Kell M.D.   On: 05/27/2013 14:17    Medications:  Prior to Admission:  Prescriptions prior to admission  Medication Sig Dispense Refill  . acetaminophen (TYLENOL) 500 MG tablet Take 1,000 mg by mouth every 6 (six) hours as needed for moderate pain.       . Aclidinium Bromide (TUDORZA PRESSAIR) 400 MCG/ACT AEPB Inhale 1 puff into the lungs 2 (two) times daily.      Marland Kitchen albuterol (PROVENTIL HFA;VENTOLIN HFA) 108 (90 BASE) MCG/ACT inhaler Inhale 2 puffs into the lungs every 6 (six) hours as needed for wheezing or shortness of breath.       Marland Kitchen amiodarone (PACERONE) 200 MG tablet Take 1 tablet (200 mg total) by mouth daily.  30 tablet  6  . antiseptic oral rinse (BIOTENE) LIQD 15 mLs by Mouth Rinse route 4 (four) times daily as needed for dry mouth.      Marland Kitchen arformoterol (BROVANA) 15 MCG/2ML NEBU Take 15 mcg by nebulization 2 (two) times daily.      . budesonide (PULMICORT) 0.25 MG/2ML nebulizer solution Take 0.25 mg by nebulization 2 (two) times daily.      . cloNIDine (CATAPRES) 0.1 MG tablet Take 1 tablet (0.1 mg total) by  mouth 2 (two) times daily.      . clotrimazole (MYCELEX) 10 MG troche Take 10 mg by mouth daily.       . digoxin (LANOXIN) 0.125 MG tablet Take 0.125 mg by mouth every morning.      . furosemide (LASIX) 40 MG tablet Take 1 tablet (40 mg total) by mouth 2 (two) times daily. *takes an extra 40mg  tablet if needed for swelling  60 tablet  6  . levalbuterol (XOPENEX) 1.25 MG/0.5ML nebulizer solution Take 1.25 mg by nebulization every 6 (six) hours.  1 each  12  . linagliptin (TRADJENTA) 5 MG TABS tablet Take 5 mg by mouth daily.      Marland Kitchen losartan (COZAAR) 100 MG tablet Take 100 mg by mouth every morning.       . metoprolol (LOPRESSOR) 50 MG tablet Take 1.5 tablets (75 mg total) by mouth 2 (two) times daily.  90 tablet  6  . omeprazole (PRILOSEC) 20 MG capsule Take  20 mg by mouth 2 (two) times daily.       . potassium chloride (K-DUR) 10 MEQ tablet Take 1 tablet (10 mEq total) by mouth daily.  30 tablet  6  . prasugrel (EFFIENT) 10 MG TABS tablet Take 1 tablet (10 mg total) by mouth daily.  30 tablet  6  . predniSONE (DELTASONE) 10 MG tablet Take 1 tablet (10 mg total) by mouth See admin instructions. 30mg  a day for 2 days, then 20mg  a day for 2 days, then resume your 10mg  a day.      . ramipril (ALTACE) 2.5 MG capsule Take 2.5 mg by mouth daily.       . rosuvastatin (CRESTOR) 10 MG tablet Take 10 mg by mouth every evening.      . sodium chloride (OCEAN) 0.65 % nasal spray Place 1 spray into the nose every 6 (six) hours as needed for congestion.      Marland Kitchen warfarin (COUMADIN) 2.5 MG tablet Take 2.5 mg by mouth daily at 6 PM.       . nitroGLYCERIN (NITROSTAT) 0.4 MG SL tablet Place 1 tablet (0.4 mg total) under the tongue every 5 (five) minutes x 3 doses as needed for chest pain.  25 tablet  3   Scheduled: . amiodarone  200 mg Oral Daily  . arformoterol  15 mcg Nebulization BID  . azithromycin  250 mg Oral QHS  . budesonide  0.25 mg Nebulization BID  . cloNIDine  0.1 mg Oral BID  . digoxin  0.125 mg Oral  q morning - 10a  . furosemide  40 mg Intravenous Once  . insulin aspart  0-15 Units Subcutaneous TID WC  . insulin aspart  0-5 Units Subcutaneous QHS  . ipratropium  0.5 mg Nebulization Q6H  . levalbuterol  1.25 mg Nebulization 4 times per day  . linagliptin  5 mg Oral Daily  . losartan  100 mg Oral Daily  . methylPREDNISolone (SOLU-MEDROL) injection  80 mg Intravenous Q12H  . metoprolol tartrate  75 mg Oral BID  . pantoprazole  40 mg Oral Daily  . prasugrel  10 mg Oral Daily  . ramipril  2.5 mg Oral Daily  . sodium chloride  3 mL Intravenous Q12H  . Warfarin - Physician Dosing Inpatient   Does not apply q1800   Continuous:  ZOX:WRUEAV chloride, acetaminophen, albuterol, antiseptic oral rinse, guaiFENesin-dextromethorphan, nitroGLYCERIN, sodium chloride  Assesment: He has multiple medical problems including severe COPD which is end stage and he has chronic respiratory failure. He had more trouble with his inhaler last night. He also has ischemic cardiomyopathy with combined systolic and diastolic CHF. He has more fluid some of which I think is because of his position but he may be starting to develop more problems with heart failure as well Principal Problem:   COPD exacerbation Active Problems:   Coronary atherosclerosis of native coronary artery   RV (right ventricular) mural thrombus   Long term (current) use of anticoagulants   COPD (chronic obstructive pulmonary disease)   Secondary cardiomyopathy, unspecified   Pulmonary hypertension   Atrial fibrillation   PAT (paroxysmal atrial tachycardia)   Chronic respiratory failure   Acute on chronic respiratory failure with hypoxia   Chronic combined systolic and diastolic CHF (congestive heart failure)   Steroid-dependent COPD   Renal insufficiency, mild    Plan: Discontinue the inhaler.tudorza. I'm going to give him a dose of Lasix. Continue his other treatments.    LOS: 3 days   Keylin Podolsky  L 05/29/2013, 10:05 AM

## 2013-05-29 NOTE — Progress Notes (Addendum)
Patient had several episodes during the night feeling as if he wasn't able to breathe, and not getting enough air.  Patient alert and oriented during episodes, mild distress noted. Patient unable to sleep well during night.   Patient was debating if bipap would help his breathing at night.

## 2013-05-29 NOTE — Progress Notes (Signed)
[name], Danny [name]                ACCOUNT NO.:  [id number]  MEDICAL RECORD NO.:  [id number]  LOCATION:                                 FACILITY:  PHYSICIAN:  Melvyn Novas, MDDATE OF BIRTH:  November 12, 1955  DATE OF PROCEDURE:  05/28/2013 DATE OF DISCHARGE:                                PROGRESS NOTE   A 58 year old, black male, known COPD, known cor pulmonale, known stenting to the right circumflex and the right coronary artery in January 2014 and 2015, severely dilated right atrium, right ventricle, hypertension, hyperlipidemia, insulin-dependent diabetes, stage II chronic renal failure, admitted with decreasing saturations of O2 despite his CPAP at night, attempting to optimize his lung function and right ventricular contractility function, currently in sinus rhythm, anticoagulated on Coumadin with a right ventricular thrombus by history. INR is 3.0.  Last echo several months ago repeat reveals normal systolic function of his left ventricle with grade 1 diastolic dysfunction, and severely dilated right atrium, right ventricle.  He was turned down for lung transplant at [facility].  Prognosis is extremely guarded at present.  Currently, on Solu-Medrol 180 mg IV q.8 hours as well as DuoNeb nebulizer [place] q.12 and nebulizer q.6 h.  Lungs show diminished breath sounds in the bases.  Prolonged inspiratory and expiratory phase.  No rales, no wheeze appreciable today.  Heart regular rhythm.  No S3, S4.  No heaves, thrills, or rubs.  A 1+ chronic pedal edema.  The patient advised he needs compression hose to combat lower extremity edema and no further diuretics as he will need volume for his right ventricle.  The patient and wife seem to understand.  Blood pressure is 132/68, pulse is 68 and regular.  Appreciate ABGs with CO2 retention.  We will allow Pulmonary to optimize his lung function if possible and make further recommendations as the database  expands.     Melvyn Novas, MD     RMD/MEDQ  D:  05/28/2013  T:  05/29/2013  Job:  [id number]

## 2013-05-29 NOTE — Progress Notes (Signed)
[id number] 

## 2013-05-29 NOTE — Progress Notes (Signed)
CRITICAL VALUE ALERT  Critical value received: cos 40  Date of notification:  05-29-13 Time of notification:  0700  Critical value read back:  Nurse who received alert:  b Milana Obey   MD notified (1st page):   Time of first page:  MD notified (2nd page):  Time of second page:  Responding MD:   Lab value consist with earlier values.   Time MD responded:   Lab value consistent with earlier values

## 2013-05-30 ENCOUNTER — Encounter (HOSPITAL_COMMUNITY): Payer: Managed Care, Other (non HMO)

## 2013-05-30 LAB — GLUCOSE, CAPILLARY
Glucose-Capillary: 204 mg/dL — ABNORMAL HIGH (ref 70–99)
Glucose-Capillary: 314 mg/dL — ABNORMAL HIGH (ref 70–99)
Glucose-Capillary: 130 mg/dL — ABNORMAL HIGH (ref 70–99)
Glucose-Capillary: 122 mg/dL — ABNORMAL HIGH (ref 70–99)

## 2013-05-30 LAB — BASIC METABOLIC PANEL
BUN: 41 mg/dL — ABNORMAL HIGH (ref 6–23)
CALCIUM: 10 mg/dL (ref 8.4–10.5)
CHLORIDE: 94 meq/L — AB (ref 96–112)
CO2: 39 mEq/L — ABNORMAL HIGH (ref 19–32)
Creatinine, Ser: 1.5 mg/dL — ABNORMAL HIGH (ref 0.50–1.35)
GFR calc Af Amer: 58 mL/min — ABNORMAL LOW (ref >90)
GFR calc non Af Amer: 50 mL/min — ABNORMAL LOW (ref >90)
GLUCOSE: 180 mg/dL — AB (ref 70–99)
Potassium: 4.9 mEq/L (ref 3.7–5.3)
SODIUM: 140 meq/L (ref 137–147)

## 2013-05-30 LAB — PROTIME-INR
INR: 2.58 — ABNORMAL HIGH (ref 0.00–1.49)
PROTHROMBIN TIME: 26.8 s — AB (ref 11.6–15.2)

## 2013-05-30 MED ORDER — MAGIC MOUTHWASH
5.0000 mL | Freq: Three times a day (TID) | ORAL | Status: DC | PRN
  Administered 2013-05-30 (×2): 5 mL via ORAL
  Filled 2013-05-30 (×3): qty 5

## 2013-05-30 MED ORDER — WARFARIN SODIUM 2 MG PO TABS
2.0000 mg | ORAL_TABLET | Freq: Every day | ORAL | Status: DC
  Administered 2013-05-30 – 2013-05-31 (×2): 2 mg via ORAL
  Filled 2013-05-30 (×2): qty 1

## 2013-05-30 NOTE — Progress Notes (Signed)
05/30/13 1820 Late entry for 0830. Patient to have BIPAP at night per Dr. Juanetta Gosling. Notified RT this morning. [name], RN

## 2013-05-30 NOTE — Progress Notes (Signed)
05/30/13 1228 Patient requested nursing staff assess "back of my throat". Noted to have small white areas to back of mouth and tongue. Notified Dr. Janna Arch. Orders received for magic mouthwash TID as needed. [name], RN

## 2013-05-30 NOTE — Progress Notes (Signed)
Danny [name], Danny [name] NO.:  [id number]  MEDICAL RECORD NO.:  [id number]  LOCATION:  A339                          FACILITY:  [facility]  PHYSICIAN:  Melvyn Novas, MDDATE OF BIRTH:  [birth year]  DATE OF PROCEDURE: DATE OF DISCHARGE:                                PROGRESS NOTE   SUBJECTIVE:  The patient has end-stage cor pulmonale on maximal medical therapy.  Dilated right atrium, right ventricle, normal left ventricular and LV functions.  Coronary artery disease, status post stenting x2 to the circumflex and RCA.  Hypertension, hyperlipidemia, status post lung reduction surgery.  The patient complains of orthopnea at night.  No documented desaturations by pulse oximetry.  He likewise has insulin- dependent diabetes under fair glycemic control.  I had a long talk with the patient about I believe we have optimized medical therapy and wiggle room to improve his symptomatology.  He seems to understand this, continuing IV steroids, Solu-Medrol and bronchodilated nebulizer q. 4-6 h. p.r.n.  Creatinine is 1.50.  Electrolytes within normal limits.  OBJECTIVE:  LUNGS:  Diminished breath sounds at the bases.  Scattered rhonchi.  No rales, no wheeze. HEART:  Regular rhythm.  No S3, S4.  No heaves, thrills, or rubs. ABDOMEN:  Soft, nontender.  Bowel sounds normoactive.  PLAN:  Right now is to continue IV Solu-Medrol and bronchodilators. Blood pressure is 122/68.  He is afebrile.  I will make further recommendations.  His INR dipped down to 2.5.  We will resume Coumadin at 2 mg per day instead of 2.5 as he is mildly supratherapeutic.     Melvyn Novas, MD     RMD/MEDQ  D:  05/30/2013  T:  05/30/2013  Job:  [id number]

## 2013-05-30 NOTE — Plan of Care (Signed)
Problem: Phase I Progression Outcomes Goal: Pt OOB to Walk or Exercise Daily With Nursing or PT Patient OOB to walk or exercise daily with nursing or PT if activity order permits  Outcome: Progressing 05/30/13 1019 Patient up to chair this morning, states more comfortable sitting up. Instructed to call for assistance as needed. Call light and phone within reach. Pt states will call for assistance. Danny Regal, RN

## 2013-05-30 NOTE — Progress Notes (Signed)
[id number] 

## 2013-05-30 NOTE — Progress Notes (Signed)
Pt placed on BIPAP 12/6 with 4lpm bleed in pt tolerate well will continue to monitor through out the night

## 2013-05-30 NOTE — Progress Notes (Signed)
I had continued discussion with him after Dr. Delbert Harness had discussion with him about his overall situation and prognosis. He understands that he has severe heart and lung disease and that even with maximum treatment he is having significant difficulty. He does feel that he did better on BiPAP at home so I'm going to put him on BiPAP at night no other new treatments

## 2013-05-30 NOTE — Progress Notes (Signed)
05/30/13 1057 Patient and wife expressed concerns regarding swelling to his feet and not being on lasix. Notified Dr. Janna Arch. Stated patient to continue wearing TED hose, keep feet/legs elevated, no need for lasix at this time. Discussed with patient. TED hose remain in place, feet elevated. Pt and wife state they understand rationale for no lasix at this time. [name], RN

## 2013-05-30 NOTE — Progress Notes (Signed)
Inpatient Diabetes Program Recommendations  AACE/ADA: New Consensus Statement on Inpatient Glycemic Control (2013)  Target Ranges:  Prepandial:   less than 140 mg/dL      Peak postprandial:   less than 180 mg/dL (1-2 hours)      Critically ill patients:  140 - 180 mg/dL   Results for Danny [name], Danny [name] (MRN [id number]) as of 05/30/2013 07:56  Ref. Range 05/29/2013 08:39 05/29/2013 11:12 05/29/2013 16:19 05/29/2013 20:36 05/30/2013 07:19  Glucose-Capillary Latest Range: 70-99 mg/dL 735 (H) 329 (H) 924 (H) 143 (H) 204 (H)   Diabetes history: DM2  Outpatient Diabetes medications: Tradjenta 5 mg daily  Current orders for Inpatient glycemic control: Tradjenta 5 mg daily, Novolog 0-15 units AC, Novolog 0-5 units HS  Inpatient Diabetes Program Recommendations Insulin - Meal Coverage: Please consider ordering Novolog 3 units TID with meals for meal coverage while on steroids. HgbA1C: Please consider ordering an A1C to evaluate glycemic control over the past 2-3 months.  Thanks, Orlando Penner, RN, MSN, CCRN Diabetes Coordinator Inpatient Diabetes Program [phone] (Team Pager) [phone] ([facility] office) [phone] [facility] office)

## 2013-05-30 NOTE — Care Management Note (Addendum)
    Page 1 of 2   06/06/2013     2:32:00 PM   CARE MANAGEMENT NOTE 06/06/2013  Patient:  Danny [name], Danny [name]   Account Number:  [id number]  Date Initiated:  05/30/2013  Documentation initiated by:  Sharrie Rothman  Subjective/Objective Assessment:   Pt admitted from home with COPD exacerbation. Pt lives with his wife and will return home at discharge. Pt is fairly independent with ADL's. Pt has rollator, shower chair, home O2, walk in shower with grab bars. Pt is participating in     Action/Plan:   cardiopulmonary rehab when able and would like to continue this until unable to participate. Pt does not want HH at this time and will defer until cardiopulomonary rehab is no longer an option.   Anticipated DC Date:  06/02/2013   Anticipated DC Plan:  HOME/SELF CARE      DC Planning Services  CM consult      Choice offered to / List presented to:          [facility] arranged  HH-1 RN  HH-10 DISEASE MANAGEMENT  HH-2 PT  HH-4 NURSE'S AIDE      HH agency  [facility]   Status of service:  Completed, signed off Medicare Important Message given?   (If response is "NO", the following Medicare IM given date fields will be blank) Date Medicare IM given:   Date Additional Medicare IM given:    Discharge Disposition:  HOME W HOME HEALTH SERVICES  Per UR Regulation:    If discussed at Long Length of Stay Meetings, dates discussed:    Comments:  06/06/13 Rosemary Holms RN BSN CM Spoke with [facility] 3/5 who confirmed that they would not be able to do the Skilled RNing. Levora Dredge [phone]) stated tht she would call wife to explain there had been confusion and Maxim would be unable to assist with [facility] RN. Plans are for pt to be dc'd with [facility] RN and PT, possibly an aide. Pt states it will be Monday before he is discharged.  06/02/13 Alvaro Aungst Leanord Hawking RN BSN CM Pt's daughter Glee Arvin, is a CNA and would like to pursue being paid to care for pt. CM spoke with pt and daughter at bedside. List of HH agencies in  [place] co given to daughter. Will pursue agencies that participate with [facility] co. CM stated that the employment relationship to care for her father would fall to her to arrange but it is unlikely that this will happen. Since skilled RN and Aid will come out 2-3 times a week, not sure this will work out. 1420 Spoke with Research scientist (physical sciences) at [employer]. Only HH agency that accepts insurance in [place] co is [facility]. Family knows [facility] but hesitant due to past PT did not go well. Wife and Pt. agreed with [facility] for RN, PT.  05/30/13 1300 Arlyss Queen, RN BSN CM Pt also has a bipap for home use.

## 2013-05-30 NOTE — Progress Notes (Signed)
Danny [name], [place] NO.:  [id number]  MEDICAL RECORD NO.:  [id number]  LOCATION:                                 FACILITY:  PHYSICIAN:  Melvyn Novas, MDDATE OF BIRTH:  1955/12/24  DATE OF PROCEDURE:  05/29/2013 DATE OF DISCHARGE:                                PROGRESS NOTE   SUBJECTIVE:  The patient complains of dyspnea last night, some orthopnea.  No anginal chest pain.  No palpitations.  No syncope.  O2 sat currently at 94%.  He is on maximal bronchodilator therapy.  His steroids were increased from 80-125 this morning.  He has some chronic pedal edema, he was instructed multiple times and wears compression hose, it is chronic pedal edema.  The Lasix is not a good idea, even that causes electrolyte disturbances as well as decreases his preload volume.  The patient has agreed to wear stockings, which he has had for months.  OBJECTIVE:  VITAL SIGNS:  Blood pressure is 124/82.  He is afebrile. Respiratory rate is 18-20. LUNGS:  Show diminished breath sounds at the bases.  No rales, wheeze, rhonchi are appreciable.  Prolonged expiratory phase. HEART:  Regular rhythm.  No S3, S4.  No heaves, thrills, or rubs. Compression hose in place.  PLAN:  Right now is to hold Coumadin as his INR is still 3.1.  Measured INR daily.  Continue IV steroids, continue maximal bronchodilator therapy.  The patient has poor prognosis due to an episode of end-stage cor pulmonale with ischemic heart disease.  There is no evidence of left ventricular congestive heart failure at present.     Melvyn Novas, MD     RMD/MEDQ  D:  05/29/2013  T:  05/29/2013  Job:  [id number]

## 2013-05-31 LAB — GLUCOSE, CAPILLARY
GLUCOSE-CAPILLARY: 238 mg/dL — AB (ref 70–99)
GLUCOSE-CAPILLARY: 217 mg/dL — AB (ref 70–99)
Glucose-Capillary: 192 mg/dL — ABNORMAL HIGH (ref 70–99)
Glucose-Capillary: 131 mg/dL — ABNORMAL HIGH (ref 70–99)

## 2013-05-31 LAB — PROTIME-INR
INR: 2.94 — ABNORMAL HIGH (ref 0.00–1.49)
Prothrombin Time: 29.6 seconds — ABNORMAL HIGH (ref 11.6–15.2)

## 2013-05-31 MED ORDER — INSULIN GLARGINE 100 UNIT/ML ~~LOC~~ SOLN
20.0000 [IU] | Freq: Every day | SUBCUTANEOUS | Status: DC
  Administered 2013-05-31 – 2013-06-10 (×11): 20 [IU] via SUBCUTANEOUS
  Filled 2013-05-31 (×12): qty 0.2

## 2013-05-31 MED ORDER — ACLIDINIUM BROMIDE 400 MCG/ACT IN AEPB
1.0000 | INHALATION_SPRAY | Freq: Every day | RESPIRATORY_TRACT | Status: DC
  Administered 2013-05-31 – 2013-06-08 (×5): 1 via RESPIRATORY_TRACT

## 2013-05-31 NOTE — Progress Notes (Signed)
05/31/13 1232 Order placed for aclidinium bromide inhaler, patient may use from home since not available via pharmacy. Patient states his wife will bring this afternoon when she comes. Instructed to notify nursing staff/ RT when inhaler is available. [name], RN

## 2013-05-31 NOTE — Progress Notes (Signed)
Subjective: He says he feels a little better. He wants to restart [place].  Objective: Vital signs in last 24 hours: Temp:  [97.9 F (36.6 C)-98.3 F (36.8 C)] 98.3 F (36.8 C) (02/28 0400) Pulse Rate:  [66-68] 66 (02/28 0400) Resp:  [18] 18 (02/28 0400) BP: (112-139)/(69-87) 139/87 mmHg (02/28 0400) SpO2:  [88 %-100 %] 91 % (02/28 0739) Weight:  [73.664 kg (162 lb 6.4 oz)] 73.664 kg (162 lb 6.4 oz) (02/28 0400) Weight change: 2.164 kg (4 lb 12.3 oz) Last BM Date: 05/30/13  Intake/Output from previous day: 02/27 0701 - 02/28 0700 In: 1203 [P.O.:1200; I.V.:3] Out: 850 [Urine:850]  PHYSICAL EXAM General appearance: alert, cooperative and mild distress Resp: rhonchi bilaterally Cardio: regular rate and rhythm, S1, S2 normal, no murmur, click, rub or gallop GI: soft, non-tender; bowel sounds normal; no masses,  no organomegaly Extremities: extremities normal, atraumatic, no cyanosis or edema  Lab Results:    Basic Metabolic Panel:  Recent Labs  16/10/96 0620 05/30/13 0629  NA 142 140  K 4.5 4.9  CL 95* 94*  CO2 40* 39*  GLUCOSE 175* 180*  BUN 39* 41*  CREATININE 1.50* 1.50*  CALCIUM 9.9 10.0   Liver Function Tests: No results found for this basename: AST, ALT, ALKPHOS, BILITOT, PROT, ALBUMIN,  in the last 72 hours No results found for this basename: LIPASE, AMYLASE,  in the last 72 hours No results found for this basename: AMMONIA,  in the last 72 hours CBC: No results found for this basename: WBC, NEUTROABS, HGB, HCT, MCV, PLT,  in the last 72 hours Cardiac Enzymes: No results found for this basename: CKTOTAL, CKMB, CKMBINDEX, TROPONINI,  in the last 72 hours BNP: No results found for this basename: PROBNP,  in the last 72 hours D-Dimer: No results found for this basename: DDIMER,  in the last 72 hours CBG:  Recent Labs  05/29/13 2036 05/30/13 0719 05/30/13 1112 05/30/13 1643 05/30/13 2139 05/31/13 0722  GLUCAP 143* 204* 314* 130* 122* 192*    Hemoglobin A1C: No results found for this basename: HGBA1C,  in the last 72 hours Fasting Lipid Panel: No results found for this basename: CHOL, HDL, LDLCALC, TRIG, CHOLHDL, LDLDIRECT,  in the last 72 hours Thyroid Function Tests: No results found for this basename: TSH, T4TOTAL, FREET4, T3FREE, THYROIDAB,  in the last 72 hours Anemia Panel: No results found for this basename: VITAMINB12, FOLATE, FERRITIN, TIBC, IRON, RETICCTPCT,  in the last 72 hours Coagulation:  Recent Labs  05/30/13 0629 05/31/13 0556  LABPROT 26.8* 29.6*  INR 2.58* 2.94*   Urine Drug Screen: Drugs of Abuse  No results found for this basename: labopia, cocainscrnur, labbenz, amphetmu, thcu, labbarb    Alcohol Level: No results found for this basename: ETH,  in the last 72 hours Urinalysis: No results found for this basename: COLORURINE, APPERANCEUR, LABSPEC, PHURINE, GLUCOSEU, HGBUR, BILIRUBINUR, KETONESUR, PROTEINUR, UROBILINOGEN, NITRITE, LEUKOCYTESUR,  in the last 72 hours Misc. Labs:  ABGS No results found for this basename: PHART, PCO2, PO2ART, TCO2, HCO3,  in the last 72 hours CULTURES No results found for this or any previous visit (from the past 240 hour(s)). Studies/Results: No results found.  Medications:  Prior to Admission:  Prescriptions prior to admission  Medication Sig Dispense Refill  . acetaminophen (TYLENOL) 500 MG tablet Take 1,000 mg by mouth every 6 (six) hours as needed for moderate pain.       . Aclidinium Bromide (TUDORZA PRESSAIR) 400 MCG/ACT AEPB Inhale 1 puff into the  lungs 2 (two) times daily.      Marland Kitchen. albuterol (PROVENTIL HFA;VENTOLIN HFA) 108 (90 BASE) MCG/ACT inhaler Inhale 2 puffs into the lungs every 6 (six) hours as needed for wheezing or shortness of breath.       Marland Kitchen. amiodarone (PACERONE) 200 MG tablet Take 1 tablet (200 mg total) by mouth daily.  30 tablet  6  . antiseptic oral rinse (BIOTENE) LIQD 15 mLs by Mouth Rinse route 4 (four) times daily as needed for dry  mouth.      Marland Kitchen. arformoterol (BROVANA) 15 MCG/2ML NEBU Take 15 mcg by nebulization 2 (two) times daily.      . budesonide (PULMICORT) 0.25 MG/2ML nebulizer solution Take 0.25 mg by nebulization 2 (two) times daily.      . cloNIDine (CATAPRES) 0.1 MG tablet Take 1 tablet (0.1 mg total) by mouth 2 (two) times daily.      . clotrimazole (MYCELEX) 10 MG troche Take 10 mg by mouth daily.       . digoxin (LANOXIN) 0.125 MG tablet Take 0.125 mg by mouth every morning.      . furosemide (LASIX) 40 MG tablet Take 1 tablet (40 mg total) by mouth 2 (two) times daily. *takes an extra 40mg  tablet if needed for swelling  60 tablet  6  . levalbuterol (XOPENEX) 1.25 MG/0.5ML nebulizer solution Take 1.25 mg by nebulization every 6 (six) hours.  1 each  12  . linagliptin (TRADJENTA) 5 MG TABS tablet Take 5 mg by mouth daily.      Marland Kitchen. losartan (COZAAR) 100 MG tablet Take 100 mg by mouth every morning.       . metoprolol (LOPRESSOR) 50 MG tablet Take 1.5 tablets (75 mg total) by mouth 2 (two) times daily.  90 tablet  6  . omeprazole (PRILOSEC) 20 MG capsule Take 20 mg by mouth 2 (two) times daily.       . potassium chloride (K-DUR) 10 MEQ tablet Take 1 tablet (10 mEq total) by mouth daily.  30 tablet  6  . prasugrel (EFFIENT) 10 MG TABS tablet Take 1 tablet (10 mg total) by mouth daily.  30 tablet  6  . predniSONE (DELTASONE) 10 MG tablet Take 1 tablet (10 mg total) by mouth See admin instructions. 30mg  a day for 2 days, then 20mg  a day for 2 days, then resume your 10mg  a day.      . ramipril (ALTACE) 2.5 MG capsule Take 2.5 mg by mouth daily.       . rosuvastatin (CRESTOR) 10 MG tablet Take 10 mg by mouth every evening.      . sodium chloride (OCEAN) 0.65 % nasal spray Place 1 spray into the nose every 6 (six) hours as needed for congestion.      Marland Kitchen. warfarin (COUMADIN) 2.5 MG tablet Take 2.5 mg by mouth daily at 6 PM.       . nitroGLYCERIN (NITROSTAT) 0.4 MG SL tablet Place 1 tablet (0.4 mg total) under the tongue every  5 (five) minutes x 3 doses as needed for chest pain.  25 tablet  3   Scheduled: . amiodarone  200 mg Oral Daily  . arformoterol  15 mcg Nebulization BID  . budesonide  0.25 mg Nebulization BID  . cloNIDine  0.1 mg Oral BID  . digoxin  0.125 mg Oral q morning - 10a  . insulin aspart  0-15 Units Subcutaneous TID WC  . insulin aspart  0-5 Units Subcutaneous QHS  . ipratropium  0.5 mg Nebulization Q6H  . levalbuterol  1.25 mg Nebulization 4 times per day  . linagliptin  5 mg Oral Daily  . losartan  100 mg Oral Daily  . methylPREDNISolone (SOLU-MEDROL) injection  80 mg Intravenous Q12H  . metoprolol tartrate  75 mg Oral BID  . pantoprazole  40 mg Oral Daily  . prasugrel  10 mg Oral Daily  . ramipril  2.5 mg Oral Daily  . sodium chloride  3 mL Intravenous Q12H  . warfarin  2 mg Oral q1800  . Warfarin - Physician Dosing Inpatient   Does not apply q1800   Continuous:  VVO:HYWVPX chloride, acetaminophen, albuterol, antiseptic oral rinse, guaiFENesin-dextromethorphan, magic mouthwash, nitroGLYCERIN, sodium chloride  Assesment: He has COPD exacerbation. His COPD is severe. He did better with BiPAP last night. He has multiple other medical problems as below. He is chronically sick and I do not expect him to improve a great deal Principal Problem:   COPD exacerbation Active Problems:   Coronary atherosclerosis of native coronary artery   RV (right ventricular) mural thrombus   Long term (current) use of anticoagulants   COPD (chronic obstructive pulmonary disease)   Secondary cardiomyopathy, unspecified   Pulmonary hypertension   Atrial fibrillation   PAT (paroxysmal atrial tachycardia)   Chronic respiratory failure   Acute on chronic respiratory failure with hypoxia   Chronic combined systolic and diastolic CHF (congestive heart failure)   Steroid-dependent COPD   Renal insufficiency, mild    Plan: Continue treatments    LOS: 5 days   Danny [name] 05/31/2013, 9:47 AM

## 2013-05-31 NOTE — Progress Notes (Signed)
Danny [name], Danny [name]                ACCOUNT NO.:  [id number]  MEDICAL RECORD NO.:  [id number]  LOCATION:  A339                          FACILITY:  [facility]  PHYSICIAN:  Melvyn Novas, MDDATE OF BIRTH:  Jul 17, 1955  DATE OF PROCEDURE: DATE OF DISCHARGE:                                PROGRESS NOTE   The patient has ischemic cardiomyopathy, cor pulmonale, right ventricular severe and atrial dilatation, normal left ventricular function, status post lung reduction surgery turned down for lung [facility] at [facility], insulin-dependent diabetes.  He has a right ventricular thrombus anticoagulated for better part of a year on Coumadin therapeutic, INR is 2.7.  He has a suboptimal glycemic control with NovoLog before meals and at bedtime.  I will add Lantus 20 units subcu at bedtime now.  He is hemodynamically stable.  States he has less dyspnea with his BiPAP at night.  We understand this is end-stage disease with little room to improve lung function.  The planright now is to continue with his Solu-Medrol and wean it as per Pulmonary.  Coumadin has been restarted at a lower dose at 2 mg per day. Check INR daily after being supratherapeutic.  Lungs show scattered rhonchi, diminished breath sounds at bases.  No rales audible.  Mild end- expiratory wheeze audible.  Heart:  Regular rhythm.  No S3, S4.  No heaves, thrills, or rubs.  Extremities:  There is a trace to 1+ chronic pedal edema with compression hose.  The plan right now is as mentioned above, and we will make further recommendations as the database expands.     Melvyn Novas, MD     RMD/MEDQ  D:  05/31/2013  T:  05/31/2013  Job:  [id number]

## 2013-05-31 NOTE — Progress Notes (Signed)
Pt on nasal mask BIPAP setting 12/6 with 4lpm bleed in tol well will continue to monitor through the night

## 2013-05-31 NOTE — Progress Notes (Signed)
[id number] 

## 2013-06-01 LAB — GLUCOSE, CAPILLARY
GLUCOSE-CAPILLARY: 300 mg/dL — AB (ref 70–99)
GLUCOSE-CAPILLARY: 172 mg/dL — AB (ref 70–99)
Glucose-Capillary: 130 mg/dL — ABNORMAL HIGH (ref 70–99)
Glucose-Capillary: 205 mg/dL — ABNORMAL HIGH (ref 70–99)

## 2013-06-01 LAB — PROTIME-INR
INR: 3.54 — ABNORMAL HIGH (ref 0.00–1.49)
Prothrombin Time: 34.1 seconds — ABNORMAL HIGH (ref 11.6–15.2)

## 2013-06-01 MED ORDER — CLOTRIMAZOLE 10 MG MT TROC
10.0000 mg | Freq: Every day | OROMUCOSAL | Status: DC
  Administered 2013-06-01 – 2013-06-10 (×34): 10 mg via ORAL
  Filled 2013-06-01 (×51): qty 1

## 2013-06-01 NOTE — Progress Notes (Signed)
06/01/13 1818 INR 3.54 this morning. Scheduled coumadin order d/c'd per pharmacy for elevated INR. Notified Dr. Ouida Sills. Stated okay, MD to follow with INR results in the am. [name], RN

## 2013-06-01 NOTE — Progress Notes (Signed)
Results for AZHAR, SUBIA (MRN [id number]) as of 06/01/2013 11:42  Ref. Range 06/01/2013 10:36  Prothrombin Time Latest Range: 11.6-15.2 seconds 34.1 (H)  INR Latest Range: 0.00-1.49  3.54 (H)   Scheduled Coumadin dose d/c'd since INR rising > 3 today.    Scharlene Gloss, PharmD

## 2013-06-01 NOTE — Progress Notes (Signed)
Subjective: He feels a little better. He is hopeful of going home soon. He initially thought that he was not going to be okay on current BiPAP settings but last night did better. He doesn't feel like the Magic mouthwash is working  Objective: Vital signs in last 24 hours: Temp:  [97.6 F (36.4 C)-98 F (36.7 C)] 97.8 F (36.6 C) (03/01 0558) Pulse Rate:  [65-76] 76 (03/01 0933) Resp:  [18-20] 20 (03/01 0558) BP: (121-134)/(67-80) 133/77 mmHg (03/01 0558) SpO2:  [90 %-98 %] 96 % (03/01 0800) Weight:  [73 kg (160 lb 15 oz)] 73 kg (160 lb 15 oz) (03/01 0558) Weight change: -0.664 kg (-1 lb 7.4 oz) Last BM Date: 05/30/13  Intake/Output from previous day: 02/28 0701 - 03/01 0700 In: 963 [P.O.:960; I.V.:3] Out: 2300 [Urine:2300]  PHYSICAL EXAM General appearance: alert, cooperative and no distress Resp: rhonchi bilaterally Cardio: regular rate and rhythm, S1, S2 normal, no murmur, click, rub or gallop GI: soft, non-tender; bowel sounds normal; no masses,  no organomegaly Extremities: Trace to 1+ edema  Lab Results:    Basic Metabolic Panel:  Recent Labs  16/01/9601/27/15 0629  NA 140  K 4.9  CL 94*  CO2 39*  GLUCOSE 180*  BUN 41*  CREATININE 1.50*  CALCIUM 10.0   Liver Function Tests: No results found for this basename: AST, ALT, ALKPHOS, BILITOT, PROT, ALBUMIN,  in the last 72 hours No results found for this basename: LIPASE, AMYLASE,  in the last 72 hours No results found for this basename: AMMONIA,  in the last 72 hours CBC: No results found for this basename: WBC, NEUTROABS, HGB, HCT, MCV, PLT,  in the last 72 hours Cardiac Enzymes: No results found for this basename: CKTOTAL, CKMB, CKMBINDEX, TROPONINI,  in the last 72 hours BNP: No results found for this basename: PROBNP,  in the last 72 hours D-Dimer: No results found for this basename: DDIMER,  in the last 72 hours CBG:  Recent Labs  05/30/13 2139 05/31/13 0722 05/31/13 1122 05/31/13 1626 05/31/13 2136  06/01/13 0746  GLUCAP 122* 192* 238* 217* 131* 130*   Hemoglobin A1C: No results found for this basename: HGBA1C,  in the last 72 hours Fasting Lipid Panel: No results found for this basename: CHOL, HDL, LDLCALC, TRIG, CHOLHDL, LDLDIRECT,  in the last 72 hours Thyroid Function Tests: No results found for this basename: TSH, T4TOTAL, FREET4, T3FREE, THYROIDAB,  in the last 72 hours Anemia Panel: No results found for this basename: VITAMINB12, FOLATE, FERRITIN, TIBC, IRON, RETICCTPCT,  in the last 72 hours Coagulation:  Recent Labs  05/30/13 0629 05/31/13 0556  LABPROT 26.8* 29.6*  INR 2.58* 2.94*   Urine Drug Screen: Drugs of Abuse  No results found for this basename: labopia, cocainscrnur, labbenz, amphetmu, thcu, labbarb    Alcohol Level: No results found for this basename: ETH,  in the last 72 hours Urinalysis: No results found for this basename: COLORURINE, APPERANCEUR, LABSPEC, PHURINE, GLUCOSEU, HGBUR, BILIRUBINUR, KETONESUR, PROTEINUR, UROBILINOGEN, NITRITE, LEUKOCYTESUR,  in the last 72 hours Misc. Labs:  ABGS No results found for this basename: PHART, PCO2, PO2ART, TCO2, HCO3,  in the last 72 hours CULTURES No results found for this or any previous visit (from the past 240 hour(s)). Studies/Results: No results found.  Medications:  Prior to Admission:  Prescriptions prior to admission  Medication Sig Dispense Refill  . acetaminophen (TYLENOL) 500 MG tablet Take 1,000 mg by mouth every 6 (six) hours as needed for moderate pain.       .Marland Kitchen  Aclidinium Bromide (TUDORZA PRESSAIR) 400 MCG/ACT AEPB Inhale 1 puff into the lungs 2 (two) times daily.      Marland Kitchen albuterol (PROVENTIL HFA;VENTOLIN HFA) 108 (90 BASE) MCG/ACT inhaler Inhale 2 puffs into the lungs every 6 (six) hours as needed for wheezing or shortness of breath.       Marland Kitchen amiodarone (PACERONE) 200 MG tablet Take 1 tablet (200 mg total) by mouth daily.  30 tablet  6  . antiseptic oral rinse (BIOTENE) LIQD 15 mLs by Mouth  Rinse route 4 (four) times daily as needed for dry mouth.      Marland Kitchen arformoterol (BROVANA) 15 MCG/2ML NEBU Take 15 mcg by nebulization 2 (two) times daily.      . budesonide (PULMICORT) 0.25 MG/2ML nebulizer solution Take 0.25 mg by nebulization 2 (two) times daily.      . cloNIDine (CATAPRES) 0.1 MG tablet Take 1 tablet (0.1 mg total) by mouth 2 (two) times daily.      . clotrimazole (MYCELEX) 10 MG troche Take 10 mg by mouth daily.       . digoxin (LANOXIN) 0.125 MG tablet Take 0.125 mg by mouth every morning.      . furosemide (LASIX) 40 MG tablet Take 1 tablet (40 mg total) by mouth 2 (two) times daily. *takes an extra 40mg  tablet if needed for swelling  60 tablet  6  . levalbuterol (XOPENEX) 1.25 MG/0.5ML nebulizer solution Take 1.25 mg by nebulization every 6 (six) hours.  1 each  12  . linagliptin (TRADJENTA) 5 MG TABS tablet Take 5 mg by mouth daily.      Marland Kitchen losartan (COZAAR) 100 MG tablet Take 100 mg by mouth every morning.       . metoprolol (LOPRESSOR) 50 MG tablet Take 1.5 tablets (75 mg total) by mouth 2 (two) times daily.  90 tablet  6  . omeprazole (PRILOSEC) 20 MG capsule Take 20 mg by mouth 2 (two) times daily.       . potassium chloride (K-DUR) 10 MEQ tablet Take 1 tablet (10 mEq total) by mouth daily.  30 tablet  6  . prasugrel (EFFIENT) 10 MG TABS tablet Take 1 tablet (10 mg total) by mouth daily.  30 tablet  6  . predniSONE (DELTASONE) 10 MG tablet Take 1 tablet (10 mg total) by mouth See admin instructions. 30mg  a day for 2 days, then 20mg  a day for 2 days, then resume your 10mg  a day.      . ramipril (ALTACE) 2.5 MG capsule Take 2.5 mg by mouth daily.       . rosuvastatin (CRESTOR) 10 MG tablet Take 10 mg by mouth every evening.      . sodium chloride (OCEAN) 0.65 % nasal spray Place 1 spray into the nose every 6 (six) hours as needed for congestion.      Marland Kitchen warfarin (COUMADIN) 2.5 MG tablet Take 2.5 mg by mouth daily at 6 PM.       . nitroGLYCERIN (NITROSTAT) 0.4 MG SL tablet  Place 1 tablet (0.4 mg total) under the tongue every 5 (five) minutes x 3 doses as needed for chest pain.  25 tablet  3   Scheduled: Marland Kitchen Aclidinium Bromide  1 puff Inhalation Daily  . amiodarone  200 mg Oral Daily  . arformoterol  15 mcg Nebulization BID  . budesonide  0.25 mg Nebulization BID  . cloNIDine  0.1 mg Oral BID  . clotrimazole  10 mg Oral 5 X Daily  . digoxin  0.125 mg Oral q morning - 10a  . insulin aspart  0-15 Units Subcutaneous TID WC  . insulin aspart  0-5 Units Subcutaneous QHS  . insulin glargine  20 Units Subcutaneous QHS  . ipratropium  0.5 mg Nebulization Q6H  . levalbuterol  1.25 mg Nebulization 4 times per day  . linagliptin  5 mg Oral Daily  . losartan  100 mg Oral Daily  . methylPREDNISolone (SOLU-MEDROL) injection  80 mg Intravenous Q12H  . metoprolol tartrate  75 mg Oral BID  . pantoprazole  40 mg Oral Daily  . prasugrel  10 mg Oral Daily  . ramipril  2.5 mg Oral Daily  . sodium chloride  3 mL Intravenous Q12H  . warfarin  2 mg Oral q1800  . Warfarin - Physician Dosing Inpatient   Does not apply q1800   Continuous:  ZGY:FVCBSW chloride, acetaminophen, albuterol, antiseptic oral rinse, guaiFENesin-dextromethorphan, nitroGLYCERIN, sodium chloride  Assesment: He has a very complicated situation with severe end-stage COPD. He has chronic respiratory failure and is on BiPAP at home.  He has right ventricular failure and cor pulmonale. This seems worse  He has ischemic cardiomyopathy and coronary artery occlusive disease.  He has diabetes which is a fairly new diagnosis and that's doing okay  He has chronic renal failure which is a fairly new diagnosis and that's about the same Principal Problem:   COPD exacerbation Active Problems:   Coronary atherosclerosis of native coronary artery   RV (right ventricular) mural thrombus   Long term (current) use of anticoagulants   COPD (chronic obstructive pulmonary disease)   Secondary cardiomyopathy,  unspecified   Pulmonary hypertension   Atrial fibrillation   PAT (paroxysmal atrial tachycardia)   Chronic respiratory failure   Acute on chronic respiratory failure with hypoxia   Chronic combined systolic and diastolic CHF (congestive heart failure)   Steroid-dependent COPD   Renal insufficiency, mild    Plan: We discussed goals of therapy and he understands. His wife was present and participated. They asked about palliative care but don't feel they're quite ready for that. I'm going to switch him from Magic mouthwash 2 Mycelex troches and have him get up and ambulate and see how he does    LOS: 6 days   Danny [name] 06/01/2013, 10:19 AM

## 2013-06-01 NOTE — Progress Notes (Signed)
06/01/13 1819 Patient ambulated in hallway with nurse tech standby assist. States tolerated well, general weakness. O2 sat 97% on 4 lpm at rest prior to ambulation. O2 sat dropped to 83% on 4 lpm during ambulation. Pt assisted back to bed, O2 sat 92% on 4 lpm at rest. Pt stated "I did better than I thought I would, felt okay". Instructed to call for assist as needed, sitting up in chair, states comfortable, call light within reach. [name], RN

## 2013-06-01 NOTE — Progress Notes (Signed)
06/01/13 1059 Patient may use Mycelex lozenges from home per MD. [name], RN

## 2013-06-01 NOTE — Plan of Care (Signed)
Problem: Phase I Progression Outcomes Goal: Pt OOB to Walk or Exercise Daily With Nursing or PT Patient OOB to walk or exercise daily with nursing or PT if activity order permits  Outcome: Progressing 06/01/13 1053 Assisted up to chair, states comfortable sitting up. Wife at bedside this morning. Instructed to call for assistance as needed. Call light within reach. [name], RN

## 2013-06-02 ENCOUNTER — Encounter (HOSPITAL_COMMUNITY): Payer: Managed Care, Other (non HMO)

## 2013-06-02 LAB — GLUCOSE, CAPILLARY
GLUCOSE-CAPILLARY: 275 mg/dL — AB (ref 70–99)
Glucose-Capillary: 151 mg/dL — ABNORMAL HIGH (ref 70–99)
Glucose-Capillary: 132 mg/dL — ABNORMAL HIGH (ref 70–99)
Glucose-Capillary: 120 mg/dL — ABNORMAL HIGH (ref 70–99)

## 2013-06-02 LAB — PROTIME-INR
INR: 3.17 — AB (ref 0.00–1.49)
Prothrombin Time: 31.4 seconds — ABNORMAL HIGH (ref 11.6–15.2)

## 2013-06-02 MED ORDER — PREDNISONE 20 MG PO TABS
50.0000 mg | ORAL_TABLET | Freq: Every day | ORAL | Status: DC
  Administered 2013-06-03 – 2013-06-11 (×9): 50 mg via ORAL
  Filled 2013-06-02: qty 2
  Filled 2013-06-02 (×2): qty 1
  Filled 2013-06-02: qty 2
  Filled 2013-06-02: qty 1
  Filled 2013-06-02: qty 2
  Filled 2013-06-02: qty 1
  Filled 2013-06-02: qty 2
  Filled 2013-06-02: qty 1
  Filled 2013-06-02: qty 2
  Filled 2013-06-02: qty 1
  Filled 2013-06-02 (×2): qty 2
  Filled 2013-06-02 (×3): qty 1
  Filled 2013-06-02 (×2): qty 2

## 2013-06-02 NOTE — Progress Notes (Signed)
Pt wife at bedside and asked to be educated about administering insulin injections.  Pt stated his approval.  Wife observed while administering insulin to pt.  Educated on the sites to use and how to rotate sites and prep the skin.

## 2013-06-02 NOTE — Progress Notes (Signed)
Nutrition Brief Note  RD pulled to chart due to LOS.  Wt Readings from Last 15 Encounters:  06/02/13 162 lb 0.6 oz (73.5 kg)  05/22/13 160 lb (72.576 kg)  05/18/13 157 lb 3.2 oz (71.305 kg)  05/09/13 164 lb 9.6 oz (74.662 kg)  05/08/13 164 lb (74.39 kg)  04/24/13 163 lb 9.3 oz (74.2 kg)  04/24/13 163 lb 9.3 oz (74.2 kg)  04/16/13 168 lb (76.204 kg)  04/10/13 170 lb 1.6 oz (77.157 kg)  04/01/13 166 lb 10.7 oz (75.6 kg)  03/21/13 166 lb 3.2 oz (75.388 kg)  02/25/13 170 lb 9.6 oz (77.384 kg)  02/20/13 168 lb (76.204 kg)  02/18/13 164 lb 14.5 oz (74.8 kg)  02/13/13 166 lb 12.8 oz (75.66 kg)    Body mass index is 25.37 kg/(m^2). Patient meets criteria for overweight based on current BMI.   Current diet order is Heart Healthy, Carb Modified, patient is consuming approximately 50-100% of meals at this time. Labs and medications reviewed.   No nutrition interventions warranted at this time. If nutrition issues arise, please consult RD.   Danny [name] A. Mayford Knife, RD, LDN Pager: [phone]

## 2013-06-02 NOTE — Progress Notes (Signed)
Pt eligible for COPD Gold status.  According to excel spreadsheet pt received his card from [facility].

## 2013-06-02 NOTE — Progress Notes (Signed)
[id number] 

## 2013-06-02 NOTE — Discharge Summary (Signed)
Subjective: He says he feels a little bit better. He walked in the hall some yesterday and although he did have desaturation this is common at home as well. We discussed discharge planning and I don't think he needs any new equipment. He does not want a hospital bed. His BiPAP settings should be at 12/6 and then we can adjust that.  Objective: Vital signs in last 24 hours: Temp:  [97.1 F (36.2 C)-98 F (36.7 C)] 97.1 F (36.2 C) (03/02 0450) Pulse Rate:  [66-82] 66 (03/02 0450) Resp:  [18-20] 20 (03/02 0450) BP: (128-129)/(75-85) 128/85 mmHg (03/02 0450) SpO2:  [83 %-98 %] 95 % (03/02 0703) Weight:  [73.5 kg (162 lb 0.6 oz)] 73.5 kg (162 lb 0.6 oz) (03/02 0450) Weight change: 0.5 kg (1 lb 1.6 oz) Last BM Date: 05/30/13  Intake/Output from previous day: 03/01 0701 - 03/02 0700 In: 483 [P.O.:480; I.V.:3] Out: 1150 [Urine:1150]  PHYSICAL EXAM General appearance: alert, cooperative and moderate distress Resp: rhonchi bilaterally Cardio: regular rate and rhythm, S1, S2 normal, no murmur, click, rub or gallop GI: Soft without masses Extremities: 1+ edema  Lab Results:    Basic Metabolic Panel: No results found for this basename: NA, K, CL, CO2, GLUCOSE, BUN, CREATININE, CALCIUM, MG, PHOS,  in the last 72 hours Liver Function Tests: No results found for this basename: AST, ALT, ALKPHOS, BILITOT, PROT, ALBUMIN,  in the last 72 hours No results found for this basename: LIPASE, AMYLASE,  in the last 72 hours No results found for this basename: AMMONIA,  in the last 72 hours CBC: No results found for this basename: WBC, NEUTROABS, HGB, HCT, MCV, PLT,  in the last 72 hours Cardiac Enzymes: No results found for this basename: CKTOTAL, CKMB, CKMBINDEX, TROPONINI,  in the last 72 hours BNP: No results found for this basename: PROBNP,  in the last 72 hours D-Dimer: No results found for this basename: DDIMER,  in the last 72 hours CBG:  Recent Labs  05/31/13 2136 06/01/13 0746  06/01/13 1130 06/01/13 1616 06/01/13 2210 06/02/13 0800  GLUCAP 131* 130* 300* 205* 172* 151*   Hemoglobin A1C: No results found for this basename: HGBA1C,  in the last 72 hours Fasting Lipid Panel: No results found for this basename: CHOL, HDL, LDLCALC, TRIG, CHOLHDL, LDLDIRECT,  in the last 72 hours Thyroid Function Tests: No results found for this basename: TSH, T4TOTAL, FREET4, T3FREE, THYROIDAB,  in the last 72 hours Anemia Panel: No results found for this basename: VITAMINB12, FOLATE, FERRITIN, TIBC, IRON, RETICCTPCT,  in the last 72 hours Coagulation:  Recent Labs  06/01/13 1036 06/02/13 0546  LABPROT 34.1* 31.4*  INR 3.54* 3.17*   Urine Drug Screen: Drugs of Abuse  No results found for this basename: labopia, cocainscrnur, labbenz, amphetmu, thcu, labbarb    Alcohol Level: No results found for this basename: ETH,  in the last 72 hours Urinalysis: No results found for this basename: COLORURINE, APPERANCEUR, LABSPEC, PHURINE, GLUCOSEU, HGBUR, BILIRUBINUR, KETONESUR, PROTEINUR, UROBILINOGEN, NITRITE, LEUKOCYTESUR,  in the last 72 hours Misc. Labs:  ABGS No results found for this basename: PHART, PCO2, PO2ART, TCO2, HCO3,  in the last 72 hours CULTURES No results found for this or any previous visit (from the past 240 hour(s)). Studies/Results: No results found.  Medications:  Prior to Admission:  Prescriptions prior to admission  Medication Sig Dispense Refill  . acetaminophen (TYLENOL) 500 MG tablet Take 1,000 mg by mouth every 6 (six) hours as needed for moderate pain.       Marland Kitchen  Aclidinium Bromide (TUDORZA PRESSAIR) 400 MCG/ACT AEPB Inhale 1 puff into the lungs 2 (two) times daily.      Marland Kitchen albuterol (PROVENTIL HFA;VENTOLIN HFA) 108 (90 BASE) MCG/ACT inhaler Inhale 2 puffs into the lungs every 6 (six) hours as needed for wheezing or shortness of breath.       Marland Kitchen amiodarone (PACERONE) 200 MG tablet Take 1 tablet (200 mg total) by mouth daily.  30 tablet  6  .  antiseptic oral rinse (BIOTENE) LIQD 15 mLs by Mouth Rinse route 4 (four) times daily as needed for dry mouth.      Marland Kitchen arformoterol (BROVANA) 15 MCG/2ML NEBU Take 15 mcg by nebulization 2 (two) times daily.      . budesonide (PULMICORT) 0.25 MG/2ML nebulizer solution Take 0.25 mg by nebulization 2 (two) times daily.      . cloNIDine (CATAPRES) 0.1 MG tablet Take 1 tablet (0.1 mg total) by mouth 2 (two) times daily.      . clotrimazole (MYCELEX) 10 MG troche Take 10 mg by mouth daily.       . digoxin (LANOXIN) 0.125 MG tablet Take 0.125 mg by mouth every morning.      . furosemide (LASIX) 40 MG tablet Take 1 tablet (40 mg total) by mouth 2 (two) times daily. *takes an extra 40mg  tablet if needed for swelling  60 tablet  6  . levalbuterol (XOPENEX) 1.25 MG/0.5ML nebulizer solution Take 1.25 mg by nebulization every 6 (six) hours.  1 each  12  . linagliptin (TRADJENTA) 5 MG TABS tablet Take 5 mg by mouth daily.      Marland Kitchen losartan (COZAAR) 100 MG tablet Take 100 mg by mouth every morning.       . metoprolol (LOPRESSOR) 50 MG tablet Take 1.5 tablets (75 mg total) by mouth 2 (two) times daily.  90 tablet  6  . omeprazole (PRILOSEC) 20 MG capsule Take 20 mg by mouth 2 (two) times daily.       . potassium chloride (K-DUR) 10 MEQ tablet Take 1 tablet (10 mEq total) by mouth daily.  30 tablet  6  . prasugrel (EFFIENT) 10 MG TABS tablet Take 1 tablet (10 mg total) by mouth daily.  30 tablet  6  . predniSONE (DELTASONE) 10 MG tablet Take 1 tablet (10 mg total) by mouth See admin instructions. 30mg  a day for 2 days, then 20mg  a day for 2 days, then resume your 10mg  a day.      . ramipril (ALTACE) 2.5 MG capsule Take 2.5 mg by mouth daily.       . rosuvastatin (CRESTOR) 10 MG tablet Take 10 mg by mouth every evening.      . sodium chloride (OCEAN) 0.65 % nasal spray Place 1 spray into the nose every 6 (six) hours as needed for congestion.      Marland Kitchen warfarin (COUMADIN) 2.5 MG tablet Take 2.5 mg by mouth daily at 6 PM.        . nitroGLYCERIN (NITROSTAT) 0.4 MG SL tablet Place 1 tablet (0.4 mg total) under the tongue every 5 (five) minutes x 3 doses as needed for chest pain.  25 tablet  3   Scheduled: Marland Kitchen Aclidinium Bromide  1 puff Inhalation Daily  . amiodarone  200 mg Oral Daily  . arformoterol  15 mcg Nebulization BID  . budesonide  0.25 mg Nebulization BID  . cloNIDine  0.1 mg Oral BID  . clotrimazole  10 mg Oral 5 X Daily  . digoxin  0.125 mg Oral q morning - 10a  . insulin aspart  0-15 Units Subcutaneous TID WC  . insulin aspart  0-5 Units Subcutaneous QHS  . insulin glargine  20 Units Subcutaneous QHS  . ipratropium  0.5 mg Nebulization Q6H  . levalbuterol  1.25 mg Nebulization 4 times per day  . linagliptin  5 mg Oral Daily  . losartan  100 mg Oral Daily  . methylPREDNISolone (SOLU-MEDROL) injection  80 mg Intravenous Q12H  . metoprolol tartrate  75 mg Oral BID  . pantoprazole  40 mg Oral Daily  . prasugrel  10 mg Oral Daily  . ramipril  2.5 mg Oral Daily  . sodium chloride  3 mL Intravenous Q12H  . Warfarin - Physician Dosing Inpatient   Does not apply q1800   Continuous:  ZOX:WRUEAVPRN:sodium chloride, acetaminophen, albuterol, antiseptic oral rinse, guaiFENesin-dextromethorphan, nitroGLYCERIN, sodium chloride  Assesment: He has multifactorial shortness of breath which I think includes COPD coronary artery occlusive disease with cardiomyopathy pulmonary hypertension atrial fibrillation. He has mild renal insufficiency and has diabetes. He has chronic respiratory failure on BiPAP at home. He is generally improving I think. Principal Problem:   COPD exacerbation Active Problems:   Coronary atherosclerosis of native coronary artery   RV (right ventricular) mural thrombus   Long term (current) use of anticoagulants   COPD (chronic obstructive pulmonary disease)   Secondary cardiomyopathy, unspecified   Pulmonary hypertension   Atrial fibrillation   PAT (paroxysmal atrial tachycardia)   Chronic  respiratory failure   Acute on chronic respiratory failure with hypoxia   Chronic combined systolic and diastolic CHF (congestive heart failure)   Steroid-dependent COPD   Renal insufficiency, mild    Plan: Continue current treatments. He is doing end-of-life review and looking at end-of-life care.    LOS: 7 days   Juliyah Mergen L 06/02/2013, 8:56 AM

## 2013-06-02 NOTE — Progress Notes (Signed)
Results for CAPRI, SADLIER (MRN [id number]) as of 06/02/2013 11:50  Ref. Range 06/02/2013 05:46  Prothrombin Time Latest Range: 11.6-15.2 seconds 31.4 (H)  INR Latest Range: 0.00-1.49  3.17 (H)   Scheduled Coumadin dose d/c'd since INR > 3 today   S. Margo Aye, PharmD

## 2013-06-03 ENCOUNTER — Inpatient Hospital Stay (HOSPITAL_COMMUNITY): Payer: Managed Care, Other (non HMO)

## 2013-06-03 LAB — GLUCOSE, CAPILLARY
GLUCOSE-CAPILLARY: 155 mg/dL — AB (ref 70–99)
Glucose-Capillary: 80 mg/dL (ref 70–99)
Glucose-Capillary: 150 mg/dL — ABNORMAL HIGH (ref 70–99)
Glucose-Capillary: 170 mg/dL — ABNORMAL HIGH (ref 70–99)

## 2013-06-03 LAB — PROTIME-INR
INR: 2.9 — AB (ref 0.00–1.49)
PROTHROMBIN TIME: 29.3 s — AB (ref 11.6–15.2)

## 2013-06-03 MED ORDER — VANCOMYCIN HCL IN DEXTROSE 750-5 MG/150ML-% IV SOLN
750.0000 mg | Freq: Two times a day (BID) | INTRAVENOUS | Status: DC
  Administered 2013-06-03 – 2013-06-06 (×6): 750 mg via INTRAVENOUS
  Filled 2013-06-03 (×5): qty 150

## 2013-06-03 MED ORDER — WARFARIN SODIUM 1 MG PO TABS
1.0000 mg | ORAL_TABLET | Freq: Every day | ORAL | Status: DC
  Administered 2013-06-03 – 2013-06-04 (×2): 1 mg via ORAL
  Filled 2013-06-03 (×2): qty 1

## 2013-06-03 MED ORDER — WARFARIN SODIUM 1 MG PO TABS: 1.0000 mg | ORAL_TABLET | Freq: Every day | ORAL | Status: DC

## 2013-06-03 MED ORDER — DEXTROSE 5 % IV SOLN
1.0000 g | Freq: Two times a day (BID) | INTRAVENOUS | Status: DC
  Administered 2013-06-03 – 2013-06-06 (×7): 1 g via INTRAVENOUS
  Filled 2013-06-03 (×6): qty 1

## 2013-06-03 MED ORDER — VANCOMYCIN HCL IN DEXTROSE 1-5 GM/200ML-% IV SOLN
1000.0000 mg | Freq: Once | INTRAVENOUS | Status: AC
  Administered 2013-06-03: 1000 mg via INTRAVENOUS
  Filled 2013-06-03: qty 200

## 2013-06-03 MED ORDER — GUAIFENESIN-DM 100-10 MG/5ML PO SYRP: 5.0000 mL | ORAL_SOLUTION | ORAL | Status: DC | PRN

## 2013-06-03 NOTE — Discharge Summary (Signed)
NAMEOLEY, DALLIS                ACCOUNT NO.:  [id number]  MEDICAL RECORD NO.:  [id number]  LOCATION:  A339                          FACILITY:  [facility]  PHYSICIAN:  Melvyn Novas, MDDATE OF BIRTH:  [birth year]  DATE OF ADMISSION:  05/26/2013 DATE OF DISCHARGE:  LH                              DISCHARGE SUMMARY   HOSPITAL COURSE:  The patient is a 58 year old black male with significant COPD, status post lung reduction surgeries, end-stage cor pulmonale, severely dilated right atrium and right ventricle, ischemic cardiomyopathy with 2 vessels right circumflex and circumflex with stenting, basically had a right ventricular infarct with a right ventricular thrombus, currently anticoagulated on Coumadin, admitted with an episode of desaturation and dyspnea.  He was brought into the hospital.  Cardiac enzymes were negative.  Electrolytes within normal limits.  Hemodynamics were essentially stable.  He was placed on IV Solu- Medrol, seen in consultation by Cardiology and Pulmonary.  Cardiology thought there was no ischemic component contributing to these symptomatology and maintained the current medicines.  Pulmonology gave increased nebulizer therapy as well as 6 days of intravenous steroids which were subsequently reduced to Solu-Medrol 125 to 80 mg q.6 h. and then subsequently discontinued.  He is to be continued at home on his regular dose of oral prednisone 10 mg per day.  He had considerable dyspnea.  He was switched to BiPAP.  His BiPAP will be continued at home with home health care with the settings of 12/6.  DISCHARGE MEDICINES:  Tudorza 400 mcg 1 puff q.12, albuterol HFA inhaler 2 puffs q.4 h. p.r.n., amiodarone 200 mg p.o. daily, Brovana 15 mcg nebulizer daily, Pulmicort 0.25 mg solution b.i.d., Catapres 0.1 mg p.o. b.i.d., Mycelex 10 mg troche p.o. t.i.d. p.r.n., digoxin 0.125 mg p.o. daily, Robitussin DM 1 teaspoon q.4 h. p.r.n., Xopenex nebulizer 1.25 mg in 0.5 mL  nebulizer solution q.i.d. up to q.4 h. p.r.n., Tradjenta 5 mg p.o. daily for diabetic control, Cozaar 100 mg p.o. daily, Lopressor 50 mg p.o. daily, nitroglycerin sublingual 0.4 p.r.n., Prilosec 20 mg p.o. daily, Effient 10 mg p.o. daily, prednisone 10 mg p.o. daily, Altace 2.5 mg p.o. daily, Crestor 10 mg p.o. daily.  Coumadin was decreased from 2.5 mg to 1 mg daily as his INR was supratherapeutic while in hospital.  FOLLOWUP:  He will follow up in the office in 3-7 days' time to assess hemodynamics, lung function, anticoagulation status.  He is likewise urged to follow up with Pulmonary as well as Cardiology.  Home health care nurses are there for reinstitution of BiPAP as well as nebulizer therapy.     Melvyn Novas, MD     RMD/MEDQ  D:  06/03/2013  T:  06/03/2013  Job:  [id number]

## 2013-06-03 NOTE — Progress Notes (Signed)
Patient was started on antibiotics for PNA today. Orders was put in at 1322 today. Meds didn't arrive to floor til 3pm. Started med at 1622 but IV infiltrated resulting in a delay of treatment. MD notified and made aware. Family was upset due to the delay but explained to wife and ok now. Called AC to restart IV before end of shift.

## 2013-06-03 NOTE — Progress Notes (Signed)
Danny [name], Danny [name]                ACCOUNT NO.:  [id number]  MEDICAL RECORD NO.:  [id number]  LOCATION:  A339                          FACILITY:  [facility]  PHYSICIAN:  Melvyn Novas, MDDATE OF BIRTH:  May 06, 1955  DATE OF PROCEDURE: DATE OF DISCHARGE:                                PROGRESS NOTE   The patient has cor pulmonale, ischemic heart disease, significantly dilated right atrium and right ventricle, maximum medical therapy for his lung disease, placed on IV steroids, came down from 125 to 80 of Solu-Medrol minimal end-expiratory wheeze.  I had to add Lantus insulin to cover hyperglycemia of his type 2 diabetes while on steroids.  I have discontinued IV steroids today, put him on prednisone 50 p.o. daily.  We will monitor blood glucose tomorrow, hopefully discharge on plus-minus insulin, not certain yet.  PHYSICAL EXAMINATION:  VITALS:  Blood pressure 122/84 and pulse is 80 and regular.  Actually, he is afebrile. NECK:  No JVD.  No carotid bruits. LUNGS:  Show prolonged expiratory phase.  Mild end-expiratory wheeze. Scattered rhonchi.  Diminished breath sounds at the bases.  No rales audible. HEART:  Regular rhythm.  No S3, S4.  No heaves, thrills or rubs.  PLAN:  Right now is to consider discharge in 24-48 hours with BiPAP settings of 12 and/6 prednisone 50 with a taper and follow up with myself and Dr. Juanetta Gosling as an outpatient.     Melvyn Novas, MD     RMD/MEDQ  D:  06/02/2013  T:  06/03/2013  Job:  [id number]

## 2013-06-03 NOTE — Progress Notes (Signed)
PHARMACIST - PHYSICIAN COMMUNICATION DR:   Janna Arch CONCERNING:  Physician dosing warfarin - inpatient  DESCRIPTION: Patient was to be discharged 06/03/2013, however per Dr. Juanetta Gosling discharged canceled due to patient now  being treated for HCAP. Warfarin had been held due to elevated INR. According to discharge summary, patient was to be discharged on Warfarin 1 mg po daily. INR therapeutic today 2.9.  Pharmacy entered order for Warfarin 1 mg po daily as no active order in computer due to patient was not discharged today.  Please review and make changes per your judgement.  Autumn Patty [facility] 06/03/2013 2 PM

## 2013-06-03 NOTE — Progress Notes (Signed)
ANTIBIOTIC CONSULT NOTE - INITIAL  Pharmacy Consult for Vancomycin & Cefepime Indication: pneumonia  Allergies  Allergen Reactions  . Lipitor [Atorvastatin] Other (See Comments)    Muscle spasms     Patient Measurements: Height: 5\' 7"  (170.2 cm) Weight: 164 lb 3.2 oz (74.481 kg) IBW/kg (Calculated) : 66.1 Adjusted Body Weight:   Vital Signs: Temp: 102 F (38.9 C) (03/03 1022) Temp src: Oral (03/03 0538) BP: 122/71 mmHg (03/03 0846) Pulse Rate: 70 (03/03 0846) Intake/Output from previous day: 03/02 0701 - 03/03 0700 In: 720 [P.O.:720] Out: 2000 [Urine:2000] Intake/Output from this shift:    Labs: No results found for this basename: WBC, HGB, PLT, LABCREA, CREATININE,  in the last 72 hours Estimated Creatinine Clearance: 51.4 ml/min (by C-G formula based on Cr of 1.5). No results found for this basename: VANCOTROUGH, VANCOPEAK, VANCORANDOM, GENTTROUGH, GENTPEAK, GENTRANDOM, TOBRATROUGH, TOBRAPEAK, TOBRARND, AMIKACINPEAK, AMIKACINTROU, AMIKACIN,  in the last 72 hours   Microbiology: No results found for this or any previous visit (from the past 720 hour(s)).  Medical History: Past Medical History  Diagnosis Date  . GERD (gastroesophageal reflux disease)   . COPD (chronic obstructive pulmonary disease)     On home oxygen  . Chronic combined systolic and diastolic CHF (congestive heart failure)   . Essential hypertension, benign   . Cardiomyopathy     03/2013 Echo: EF 50-55%, Gr 1 DD, sev dil RV/RA with sev RV dysfxn, PASP . No evidence of RV thrombus.  . Right ventricular mural thrombus     On Coumadin  . Cor pulmonale     a. Severe RV dysfunction;  b. 04/2013 R heart cath: RA 17, RV 70/18 (22), PA 73/52.  . Coronary atherosclerosis of native coronary artery     a. 05/2012 MI/PCI: DES to [facility];  b. 04/2013 Cath/PCI: LM nl, LAD nonobs, LCX patent mid stent, RCA dom, 51m (2.5x16 Promus DES).  Marland Kitchen PAF (paroxysmal atrial fibrillation)     On  amiodarone  . Spontaneous pneumothorax 2002    Bilateral  . PAT (paroxysmal atrial tachycardia)   . Mixed hyperlipidemia   . History of pneumonia   . Type II diabetes mellitus   . 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 amiodarone  200 mg Oral Daily  . arformoterol  15 mcg Nebulization BID  . budesonide  0.25 mg Nebulization BID  . ceFEPime (MAXIPIME) IV  1 g Intravenous Q12H  . cloNIDine  0.1 mg Oral BID  . clotrimazole  10 mg Oral 5 X Daily  . digoxin  0.125 mg Oral q morning - 10a  . insulin aspart  0-15 Units Subcutaneous TID WC  . insulin aspart  0-5 Units Subcutaneous QHS  . insulin glargine  20 Units Subcutaneous QHS  . ipratropium  0.5 mg Nebulization Q6H  . levalbuterol  1.25 mg Nebulization 4 times per day  . linagliptin  5 mg Oral Daily  . losartan  100 mg Oral Daily  . metoprolol tartrate  75 mg Oral BID  . pantoprazole  40 mg Oral Daily  . prasugrel  10 mg Oral Daily  . predniSONE  50 mg Oral Q breakfast  . ramipril  2.5 mg Oral Daily  . sodium chloride  3 mL Intravenous Q12H  . vancomycin  1,000 mg Intravenous Once  . [START ON 06/04/2013] vancomycin  750 mg Intravenous Q12H  .  Warfarin - Physician Dosing Inpatient   Does not apply q1800   Assessment: Healthcare associated pneumonia  Goal of Therapy:  Vancomycin trough level 15-20 mcg/ml  Plan:  Vancomycin 1 GM IV loading dose, then 750 mg IV every 12 hours Cefepime 1 GM IV every 12 hours Vancomycin trough at steady state Labs per protocol  Raquel JamesPittman, Tamicka Shimon Bennett 06/03/2013,1:28 PM

## 2013-06-03 NOTE — Discharge Summary (Signed)
[id number] 

## 2013-06-03 NOTE — Progress Notes (Signed)
Cardiac Rehabilitation Program Outcomes Report   Orientation:  04/10/2013 Graduate Date:  NA Discharge Date:  05/19/2013 # of sessions completed: 2 DX: COPD/ CAD/ MI/CHF/Cardiomyopathy/COPD  Cardiologist: Hermina Staggers MD:  Benedetto Goad Time:  11:00  A.  Exercise Program:  Tolerates exercise @ 4.14 METS for 15 minutes, Walk Test Results:  Pre: No Pre Test performed, Patient to SOB to walk. No post test performed patient did not finish the program. and Discharged  B.  Mental Health:  Good mental attitude  C.  Education/Instruction/Skills  Accurately checks own pulse.  Rest:  75  Exercise:  96, Knows THR for exercise, Uses Perceived Exertion Scale and/or Dyspnea Scale and Attended 1  education classes  Uses Perceived Exertion Scale and/or Dyspnea Scale  D.  Nutrition/Weight Control/Body Composition:  Adherence to prescribed nutrition program: good    E.  Blood Lipids    Lab Results  Component Value Date   CHOL 115 04/20/2013   HDL 32* 04/20/2013   LDLCALC 59 04/20/2013   TRIG 119 04/20/2013   CHOLHDL 3.6 04/20/2013    F.  Lifestyle Changes:  Making positive lifestyle changes and Not smoking:  Quit 1996  G.  Symptoms noted with exercise:  Asymptomatic except for SOB  Report Completed By:  Lelon Huh. Kahliyah Dick RN   Comments:  THis is patients discharge report. He only attended 2 sessions. He was hospitalized multiple times for his COPD.  He did well other than being SOB while exercising . His O2 stayed between 84 and 90. His resting HR was 75 and Bp was 118/76. His peak HR was 96 and Peak  BP was 118/60. He wanted  His rehab to make him stronger.

## 2013-06-03 NOTE — Progress Notes (Signed)
He is being discharged. He should be on BiPAP at 12/6. I wrote him a prescription for prednisone. I will plan to see him in my office in about a week or 10 days.

## 2013-06-04 ENCOUNTER — Encounter (HOSPITAL_COMMUNITY): Payer: Managed Care, Other (non HMO)

## 2013-06-04 LAB — GLUCOSE, CAPILLARY
GLUCOSE-CAPILLARY: 213 mg/dL — AB (ref 70–99)
Glucose-Capillary: 97 mg/dL (ref 70–99)
Glucose-Capillary: 214 mg/dL — ABNORMAL HIGH (ref 70–99)
Glucose-Capillary: 138 mg/dL — ABNORMAL HIGH (ref 70–99)

## 2013-06-04 LAB — CBC
HCT: 39.1 % (ref 39.0–52.0)
HEMOGLOBIN: 12.1 g/dL — AB (ref 13.0–17.0)
MCH: 29.4 pg (ref 26.0–34.0)
MCHC: 30.9 g/dL (ref 30.0–36.0)
MCV: 94.9 fL (ref 78.0–100.0)
Platelets: 384 10*3/uL (ref 150–400)
RBC: 4.12 MIL/uL — ABNORMAL LOW (ref 4.22–5.81)
RDW: 15.7 % — AB (ref 11.5–15.5)
WBC: 19.8 10*3/uL — ABNORMAL HIGH (ref 4.0–10.5)

## 2013-06-04 LAB — BASIC METABOLIC PANEL
BUN: 27 mg/dL — ABNORMAL HIGH (ref 6–23)
CALCIUM: 9.6 mg/dL (ref 8.4–10.5)
CO2: 41 mEq/L (ref 19–32)
Chloride: 94 mEq/L — ABNORMAL LOW (ref 96–112)
Creatinine, Ser: 1.07 mg/dL (ref 0.50–1.35)
GFR calc Af Amer: 88 mL/min — ABNORMAL LOW (ref >90)
GFR, EST NON AFRICAN AMERICAN: 76 mL/min — AB (ref >90)
Glucose, Bld: 137 mg/dL — ABNORMAL HIGH (ref 70–99)
Potassium: 4.6 mEq/L (ref 3.7–5.3)
SODIUM: 139 meq/L (ref 137–147)

## 2013-06-04 LAB — PROTIME-INR
INR: 2.18 — AB (ref 0.00–1.49)
Prothrombin Time: 23.6 seconds — ABNORMAL HIGH (ref 11.6–15.2)

## 2013-06-04 NOTE — Progress Notes (Signed)
[id number] 

## 2013-06-05 LAB — GLUCOSE, CAPILLARY
GLUCOSE-CAPILLARY: 181 mg/dL — AB (ref 70–99)
GLUCOSE-CAPILLARY: 221 mg/dL — AB (ref 70–99)
Glucose-Capillary: 130 mg/dL — ABNORMAL HIGH (ref 70–99)
Glucose-Capillary: 175 mg/dL — ABNORMAL HIGH (ref 70–99)
Glucose-Capillary: 180 mg/dL — ABNORMAL HIGH (ref 70–99)

## 2013-06-05 LAB — PROTIME-INR
INR: 1.79 — ABNORMAL HIGH (ref 0.00–1.49)
PROTHROMBIN TIME: 20.3 s — AB (ref 11.6–15.2)

## 2013-06-05 MED ORDER — WARFARIN SODIUM 2 MG PO TABS
2.0000 mg | ORAL_TABLET | Freq: Every day | ORAL | Status: DC
  Administered 2013-06-05 – 2013-06-10 (×6): 2 mg via ORAL
  Filled 2013-06-05 (×6): qty 1

## 2013-06-05 MED ORDER — SIMETHICONE 80 MG PO CHEW: 80.0000 mg | CHEWABLE_TABLET | ORAL | Status: DC | PRN

## 2013-06-05 NOTE — Progress Notes (Signed)
Danny [name], Danny [name] NO.:  [id number]  MEDICAL RECORD NO.:  [id number]  LOCATION:                                 FACILITY:  PHYSICIAN:  Melvyn Novas, MDDATE OF BIRTH:  07/10/1955  DATE OF PROCEDURE: DATE OF DISCHARGE:                                PROGRESS NOTE   SUBJECTIVE:  The patient was scheduled for discharge.  Predischarge, resolution of chest x-ray showed early right upper and lower lobe infiltrates which are new.  The patient has cor pulmonale, severe emphysema with lung reduction surgery, ischemic cardiomyopathy and severely dilated right atrium and right ventricle, anticoagulated with right ventricular thrombus, on Coumadin, decreased to 1 mg per day.  The patient fairly comfortable right now.  Currently on vancomycin and cefepime.  As per Pulmonary, nebulizer therapy, steroids in the form of prednisone 50 mg p.o. daily which was reduced from IV.  OBJECTIVE:  LUNGS:  No significant wheeze.  Mild end-expiratory wheeze noted.  Scattered rhonchi and diminished breath sounds in the right base greater than left base.  No rales appreciable. HEART:  Regular rhythm.  No S3, S4.  No heaves, thrills, or rubs. ABDOMEN:  Soft, nontender.  Bowel sounds normoactive.  INR is 2.18.  WBC is 19.8, hemoglobin 12.1.  PLAN:  Right now is to continue nebulizer therapy.  Continue vancomycin and cefepime.  The patient will be here for 4-5 days, with intravenous therapy and aggressive nebulizer therapy and pulmonary toilet.  Continue Coumadin.  Check INR daily.     Melvyn Novas, MD     RMD/MEDQ  D:  06/04/2013  T:  06/04/2013  Job:  [id number]

## 2013-06-05 NOTE — Progress Notes (Signed)
[id number] 

## 2013-06-05 NOTE — Progress Notes (Signed)
06/05/13 1934 Patient c/o "gas pain" this evening with supper. Stated relieved with change of position, requested medication ordered in case further gas pain later. Notified Dr. Karilyn Cota, on call for Dr. Janna Arch. Order received for simethicone 80 mg po every 4 hours PRN for gas.  Night shift RN aware and will monitor. [name], RN

## 2013-06-05 NOTE — Progress Notes (Signed)
06/05/13 1936 Patient ambulated in hallway this evening with nurse tech assist. O2 remained in place at 5 lpm. Tolerated fairly well, general weakness and c/o dyspnea at times during ambulation. Ambulated approximately 40 feet. Assisted back to chair per nurse tech. [name], RN

## 2013-06-06 ENCOUNTER — Encounter (HOSPITAL_COMMUNITY): Payer: Managed Care, Other (non HMO)

## 2013-06-06 LAB — CBC
HCT: 38.9 % — ABNORMAL LOW (ref 39.0–52.0)
HEMOGLOBIN: 11.8 g/dL — AB (ref 13.0–17.0)
MCH: 28.9 pg (ref 26.0–34.0)
MCHC: 30.3 g/dL (ref 30.0–36.0)
MCV: 95.1 fL (ref 78.0–100.0)
PLATELETS: 379 10*3/uL (ref 150–400)
RBC: 4.09 MIL/uL — AB (ref 4.22–5.81)
RDW: 15.9 % — ABNORMAL HIGH (ref 11.5–15.5)
WBC: 21.2 10*3/uL — AB (ref 4.0–10.5)

## 2013-06-06 LAB — BASIC METABOLIC PANEL
BUN: 26 mg/dL — ABNORMAL HIGH (ref 6–23)
CO2: 38 mEq/L — ABNORMAL HIGH (ref 19–32)
Calcium: 9 mg/dL (ref 8.4–10.5)
Chloride: 98 mEq/L (ref 96–112)
Creatinine, Ser: 1.09 mg/dL (ref 0.50–1.35)
GFR calc Af Amer: 86 mL/min — ABNORMAL LOW (ref >90)
GFR, EST NON AFRICAN AMERICAN: 74 mL/min — AB (ref >90)
Glucose, Bld: 74 mg/dL (ref 70–99)
POTASSIUM: 4.2 meq/L (ref 3.7–5.3)
Sodium: 141 mEq/L (ref 137–147)

## 2013-06-06 LAB — VANCOMYCIN, TROUGH: Vancomycin Tr: 12.3 ug/mL (ref 10.0–20.0)

## 2013-06-06 LAB — GLUCOSE, CAPILLARY
GLUCOSE-CAPILLARY: 82 mg/dL (ref 70–99)
Glucose-Capillary: 248 mg/dL — ABNORMAL HIGH (ref 70–99)
Glucose-Capillary: 136 mg/dL — ABNORMAL HIGH (ref 70–99)
Glucose-Capillary: 134 mg/dL — ABNORMAL HIGH (ref 70–99)

## 2013-06-06 LAB — PROTIME-INR
INR: 1.52 — AB (ref 0.00–1.49)
Prothrombin Time: 17.9 seconds — ABNORMAL HIGH (ref 11.6–15.2)

## 2013-06-06 MED ORDER — DEXTROSE 5 % IV SOLN
1.0000 g | Freq: Three times a day (TID) | INTRAVENOUS | Status: DC
  Administered 2013-06-06 – 2013-06-11 (×15): 1 g via INTRAVENOUS
  Filled 2013-06-06 (×20): qty 1

## 2013-06-06 MED ORDER — VANCOMYCIN HCL IN DEXTROSE 1-5 GM/200ML-% IV SOLN
1000.0000 mg | Freq: Two times a day (BID) | INTRAVENOUS | Status: DC
  Administered 2013-06-06 – 2013-06-11 (×10): 1000 mg via INTRAVENOUS
  Filled 2013-06-06 (×12): qty 200

## 2013-06-06 NOTE — Progress Notes (Signed)
ANTIBIOTIC CONSULT NOTE  Pharmacy Consult for Vancomycin & Cefepime Indication: pneumonia  Allergies  Allergen Reactions  . Lipitor [Atorvastatin] Other (See Comments)    Muscle spasms     Patient Measurements: Height: 5\' 7"  (170.2 cm) Weight: 170 lb (77.111 kg) IBW/kg (Calculated) : 66.1 Adjusted Body Weight:   Vital Signs: Temp: 97.7 F (36.5 C) (03/06 0514) Temp src: Oral (03/06 0514) BP: 113/75 mmHg (03/06 1011) Pulse Rate: 80 (03/06 1011) Intake/Output from previous day: 03/05 0701 - 03/06 0700 In: 763 [P.O.:560; I.V.:3; IV Piggyback:200] Out: 450 [Urine:450] Intake/Output from this shift: Total I/O In: -  Out: 200 [Urine:200]  Labs:  Recent Labs  06/04/13 0554 06/06/13 0615  WBC 19.8* 21.2*  HGB 12.1* 11.8*  PLT 384 379  CREATININE 1.07 1.09   Estimated Creatinine Clearance: 70.7 ml/min (by C-G formula based on Cr of 1.09).  Recent Labs  06/06/13 0912  VANCOTROUGH 12.3     Microbiology: Recent Results (from the past 720 hour(s))  CULTURE, BLOOD (ROUTINE X 2)     Status: None   Collection Time    06/04/13  5:54 AM      Result Value Ref Range Status   Specimen Description BLOOD LEFT ARM   Final   Special Requests BOTTLES DRAWN AEROBIC AND ANAEROBIC 8CC   Final   Culture NO GROWTH 2 DAYS   Final   Report Status PENDING   Incomplete  CULTURE, BLOOD (ROUTINE X 2)     Status: None   Collection Time    06/04/13  6:02 AM      Result Value Ref Range Status   Specimen Description BLOOD RIGHT HAND   Final   Special Requests BOTTLES DRAWN AEROBIC AND ANAEROBIC 8CC   Final   Culture NO GROWTH 2 DAYS   Final   Report Status PENDING   Incomplete    Medical History: Past Medical History  Diagnosis Date  . GERD (gastroesophageal reflux disease)   . COPD (chronic obstructive pulmonary disease)     On home oxygen  . Chronic combined systolic and diastolic CHF (congestive heart failure)   . Essential hypertension, benign   . Cardiomyopathy    03/2013 Echo: EF 50-55%, Gr 1 DD, sev dil RV/RA with sev RV dysfxn, PASP 51mmHg. No evidence of RV thrombus.  . Right ventricular mural thrombus     On Coumadin  . Cor pulmonale     a. Severe RV dysfunction;  b. 04/2013 R heart cath: RA 17, RV 70/18 (22), PA 73/52.  . Coronary atherosclerosis of native coronary artery     a. 05/2012 MI/PCI: DES to OM [facility];  b. 04/2013 Cath/PCI: LM nl, LAD nonobs, LCX patent mid stent, RCA dom, 5368m (2.5x16 Promus DES).  Marland Kitchen. PAF (paroxysmal atrial fibrillation)     On amiodarone  . Spontaneous pneumothorax 2002    Bilateral  . PAT (paroxysmal atrial tachycardia)   . Mixed hyperlipidemia   . History of pneumonia   . Type II diabetes mellitus   . CKD (chronic kidney disease) stage 2, GFR 60-89 ml/min     Medications:  Scheduled:  Marland Kitchen. Aclidinium Bromide  1 puff Inhalation Daily  . amiodarone  200 mg Oral Daily  . arformoterol  15 mcg Nebulization BID  . budesonide  0.25 mg Nebulization BID  . ceFEPime (MAXIPIME) IV  1 g Intravenous Q12H  . cloNIDine  0.1 mg Oral BID  . clotrimazole  10 mg Oral 5 X Daily  . digoxin  0.125 mg Oral q morning - 10a  . insulin aspart  0-15 Units Subcutaneous TID WC  . insulin aspart  0-5 Units Subcutaneous QHS  . insulin glargine  20 Units Subcutaneous QHS  . ipratropium  0.5 mg Nebulization Q6H  . levalbuterol  1.25 mg Nebulization 4 times per day  . linagliptin  5 mg Oral Daily  . losartan  100 mg Oral Daily  . metoprolol tartrate  75 mg Oral BID  . pantoprazole  40 mg Oral Daily  . prasugrel  10 mg Oral Daily  . predniSONE  50 mg Oral Q breakfast  . ramipril  2.5 mg Oral Daily  . sodium chloride  3 mL Intravenous Q12H  . vancomycin  750 mg Intravenous Q12H  . warfarin  2 mg Oral q1800  . Warfarin - Physician Dosing Inpatient   Does not apply q1800   Assessment: 58 yo M on empiric, broad-spectrum antibiotics for HCAP. He has productive cough.  WBC continues to increase.  Renal function has  improved to baseline.   Cefepime 3/3>> Rocephin 3/3>>  Goal of Therapy:  Vancomycin trough level 15-20 mcg/ml  Plan:  Increase Vancomycin 1 GM IV every 12 hours Cefepime 1 GM IV every 8 hours Repeat Vancomycin trough at steady state Monitor renal function and cx data  Duration of therapy per MD  Elson Clan 06/06/2013,10:27 AM

## 2013-06-06 NOTE — Progress Notes (Signed)
[id number] 

## 2013-06-06 NOTE — Progress Notes (Signed)
NAMERAYDER, CREESE NO.:  [id number]  MEDICAL RECORD NO.:  [id number]  LOCATION:  A339                          FACILITY:  [facility]  PHYSICIAN:  Melvyn Novas, MDDATE OF BIRTH:  [birth year]  DATE OF PROCEDURE: DATE OF DISCHARGE:                                PROGRESS NOTE   SUBJECTIVE:  The patient currently has a right multilobar pneumonia, currently on dual antibiotics.  Blood cultures are negative x2.  WBCs increased from 20 to 21.2, hemoglobin 11.8, and creatinine 1.07.  He has cor pulmonale, right ventricular thrombus, severely dilated right atrium and right ventricle, status post lung reduction surgeries.  He refused lung transplant at [facility].  He has hypertension, hyperlipidemia, coronary artery disease, status post stenting x2 with hyperlipidemia.  OBJECTIVE:  VITAL SIGNS:  The patient's current O2 sat is 89%.  He has some nasal congestion.  We will use nasal spray with saline.  Suggest Afrin at present. LUNGS:  Diminished breath sounds at the bases.  Mild end-expiratory wheeze.  Scattered rhonchi.  No significant rales audible at present. HEART:  Regular rhythm.  No S3 audible.  No heaves, thrills or rubs. ABDOMEN:  Soft, nontender.  Bowel sounds normoactive.  ASSESSMENT AND PLAN:  Right now is to continue dual antibiotics. Continue aggressive nebulizer therapy.  Monitor for signs of sepsis although there are none.  Blood pressure 125/78.  Monitor INR, currently down to 1.5.  Coumadin increased to 2 mg yesterday.  We will follow trend on a daily basis.     Melvyn Novas, MD     RMD/MEDQ  D:  06/06/2013  T:  06/06/2013  Job:  [id number]

## 2013-06-06 NOTE — Progress Notes (Signed)
Was called to room and patients states his right arm felt numb. Checked vitals and done EKG. BP a little high 144/95. Spoke to MD patient had stents placed a couple of months ago. Per MD if patient has chest pain offer him Sublingual Nitroglycerin. MD will evaluate tomorrow when he gets here.

## 2013-06-06 NOTE — Progress Notes (Signed)
Pt placed on Bipap and unable to tolerate.  Pt requested to be placed back on nasal cannula.  Pt placed back on 4L nasal cannula and tolerating well.  RT will continue to monitor.

## 2013-06-06 NOTE — Progress Notes (Signed)
Danny [name], Danny [name]                ACCOUNT NO.:  [id number]  MEDICAL RECORD NO.:  [id number]  LOCATION:  A339                          FACILITY:  [facility]  PHYSICIAN:  Melvyn Novas, MDDATE OF BIRTH:  Jun 25, 1955  DATE OF PROCEDURE: DATE OF DISCHARGE:                                PROGRESS NOTE   A 58 year old white male with cor pulmonale, right atrial thrombus, right lower and upper lobe infiltrate, currently on cefepime and vancomycin, and had aggressive nebulizer therapy, feeling a little better, coughing up some greenish sputum.  No significant dyspnea.  No chest pain.  Blood pressure is 122/84, pulse is 80 and regular, afebrile.  INR down to 1.7.  I will increase Coumadin from 1 mg to 2 mg daily and monitor daily INR. LUNGS:  Diminished breath sounds at the bases.  Scattered rhonchi.  Mild end-expiratory wheeze.  Currently on prednisone 50 mg p.o. daily. HEART:  Regular rhythm.  No S3.  No heaves, thrills or rubs.  The patient appears hemodynamically stable, somewhat improving clinically, good appetite, alert.  Continue current therapy.     Melvyn Novas, MD     RMD/MEDQ  D:  06/05/2013  T:  06/06/2013  Job:  [id number]

## 2013-06-07 LAB — GLUCOSE, CAPILLARY
GLUCOSE-CAPILLARY: 155 mg/dL — AB (ref 70–99)
Glucose-Capillary: 74 mg/dL (ref 70–99)
Glucose-Capillary: 170 mg/dL — ABNORMAL HIGH (ref 70–99)
Glucose-Capillary: 223 mg/dL — ABNORMAL HIGH (ref 70–99)

## 2013-06-07 LAB — PROTIME-INR
INR: 1.72 — ABNORMAL HIGH (ref 0.00–1.49)
Prothrombin Time: 19.7 s — ABNORMAL HIGH (ref 11.6–15.2)

## 2013-06-07 NOTE — Progress Notes (Signed)
[id number] 

## 2013-06-07 NOTE — Progress Notes (Signed)
NAMEJAMIESON, HETLAND NO.:  [id number]  MEDICAL RECORD NO.:  [id number]  LOCATION:  T276                          FACILITY:  [facility]  PHYSICIAN:  Unk Lightning, MDDATE OF BIRTH:  December 29, 1955  DATE OF PROCEDURE: DATE OF DISCHARGE:                                PROGRESS NOTE   The patient is a 58 year old black male with history of cor pulmonale, status post lung reduction surgery.  Turned down for lung transplant at [facility].  Hypertension, hyperlipidemia, coronary artery disease, status post stenting x2 in RCA and OM, severely dilated right atrium, right ventricle, right ventricular thrombus, and right upper lobe and right lower lobe pneumonia.  Currently on antibiotics, vanc and cefepime for 4 days with aggressive nebulizer therapy.  The patient clinically appears to be improving, less dyspnea, O2 sats are about 90%, on nasal O2. Lungs show scattered rhonchi, prolonged inspiratory and expiratory phase.  No rales appreciable.  Heart, regular rhythm.  No S3 or S4 appreciable.  No heaves, thrills, or rubs.  Extremities showed trace to 1+ pedal edema with compression hose which is chronic.  Creatinine is within normal limits.  PLAN:  Right now is to get CBC in morning and B-MET.  Monitor renal function, on vanc as well as leukocytosis.  Continue dual antibiotics. Continue DuoNeb and we will make further recommendations as the database expands.     Unk Lightning, MD     RMD/MEDQ  D:  06/07/2013  T:  06/07/2013  Job:  [id number]

## 2013-06-08 LAB — GLUCOSE, CAPILLARY
GLUCOSE-CAPILLARY: 190 mg/dL — AB (ref 70–99)
Glucose-Capillary: 95 mg/dL (ref 70–99)
Glucose-Capillary: 150 mg/dL — ABNORMAL HIGH (ref 70–99)
Glucose-Capillary: 189 mg/dL — ABNORMAL HIGH (ref 70–99)

## 2013-06-08 LAB — CBC WITH DIFFERENTIAL/PLATELET
BASOS ABS: 0 10*3/uL (ref 0.0–0.1)
Basophils Relative: 0 % (ref 0–1)
Eosinophils Absolute: 0 10*3/uL (ref 0.0–0.7)
Eosinophils Relative: 0 % (ref 0–5)
HCT: 37 % — ABNORMAL LOW (ref 39.0–52.0)
Hemoglobin: 11.4 g/dL — ABNORMAL LOW (ref 13.0–17.0)
LYMPHS ABS: 2 10*3/uL (ref 0.7–4.0)
LYMPHS PCT: 11 % — AB (ref 12–46)
MCH: 29.1 pg (ref 26.0–34.0)
MCHC: 30.8 g/dL (ref 30.0–36.0)
MCV: 94.4 fL (ref 78.0–100.0)
MONOS PCT: 10 % (ref 3–12)
Monocytes Absolute: 1.8 10*3/uL — ABNORMAL HIGH (ref 0.1–1.0)
Neutro Abs: 14.3 10*3/uL — ABNORMAL HIGH (ref 1.7–7.7)
Neutrophils Relative %: 79 % — ABNORMAL HIGH (ref 43–77)
PLATELETS: 299 10*3/uL (ref 150–400)
RBC: 3.92 MIL/uL — AB (ref 4.22–5.81)
RDW: 16.3 % — ABNORMAL HIGH (ref 11.5–15.5)
WBC: 18.1 10*3/uL — AB (ref 4.0–10.5)

## 2013-06-08 LAB — PROTIME-INR
INR: 1.9 — ABNORMAL HIGH (ref 0.00–1.49)
PROTHROMBIN TIME: 21.2 s — AB (ref 11.6–15.2)

## 2013-06-08 MED ORDER — PAROXETINE HCL 20 MG PO TABS
20.0000 mg | ORAL_TABLET | Freq: Every day | ORAL | Status: DC
  Administered 2013-06-08 – 2013-06-11 (×4): 20 mg via ORAL
  Filled 2013-06-08 (×4): qty 1

## 2013-06-09 ENCOUNTER — Encounter (HOSPITAL_COMMUNITY): Payer: Managed Care, Other (non HMO)

## 2013-06-09 LAB — CBC
HCT: 33.4 % — ABNORMAL LOW (ref 39.0–52.0)
Hemoglobin: 10.4 g/dL — ABNORMAL LOW (ref 13.0–17.0)
MCH: 29.3 pg (ref 26.0–34.0)
MCHC: 31.1 g/dL (ref 30.0–36.0)
MCV: 94.1 fL (ref 78.0–100.0)
PLATELETS: 311 10*3/uL (ref 150–400)
RBC: 3.55 MIL/uL — ABNORMAL LOW (ref 4.22–5.81)
RDW: 16.4 % — AB (ref 11.5–15.5)
WBC: 16 10*3/uL — AB (ref 4.0–10.5)

## 2013-06-09 LAB — BASIC METABOLIC PANEL
BUN: 25 mg/dL — ABNORMAL HIGH (ref 6–23)
CO2: 36 meq/L — AB (ref 19–32)
Calcium: 8.8 mg/dL (ref 8.4–10.5)
Chloride: 99 mEq/L (ref 96–112)
Creatinine, Ser: 1.09 mg/dL (ref 0.50–1.35)
GFR calc Af Amer: 86 mL/min — ABNORMAL LOW (ref >90)
GFR, EST NON AFRICAN AMERICAN: 74 mL/min — AB (ref >90)
Glucose, Bld: 90 mg/dL (ref 70–99)
Potassium: 4.4 mEq/L (ref 3.7–5.3)
Sodium: 140 mEq/L (ref 137–147)

## 2013-06-09 LAB — CULTURE, BLOOD (ROUTINE X 2)
CULTURE: NO GROWTH
Culture: NO GROWTH

## 2013-06-09 LAB — GLUCOSE, CAPILLARY
GLUCOSE-CAPILLARY: 184 mg/dL — AB (ref 70–99)
GLUCOSE-CAPILLARY: 220 mg/dL — AB (ref 70–99)
Glucose-Capillary: 100 mg/dL — ABNORMAL HIGH (ref 70–99)
Glucose-Capillary: 111 mg/dL — ABNORMAL HIGH (ref 70–99)

## 2013-06-09 LAB — PROTIME-INR
INR: 1.84 — ABNORMAL HIGH (ref 0.00–1.49)
Prothrombin Time: 20.7 seconds — ABNORMAL HIGH (ref 11.6–15.2)

## 2013-06-09 NOTE — Progress Notes (Signed)
Patient was placed on his home BIPAP and will continue to be monitored.

## 2013-06-09 NOTE — Progress Notes (Signed)
ANTIBIOTIC CONSULT NOTE  Pharmacy Consult for Vancomycin & Cefepime Indication: pneumonia  Allergies  Allergen Reactions  . Lipitor [Atorvastatin] Other (See Comments)    Muscle spasms     Patient Measurements: Height: 5\' 7"  (170.2 cm) Weight: 174 lb 6.1 oz (79.1 kg) IBW/kg (Calculated) : 66.1  Vital Signs: Temp: 97.7 F (36.5 C) (03/09 1454) Temp src: Oral (03/09 1454) BP: 147/79 mmHg (03/09 1454) Pulse Rate: 66 (03/09 1454) Intake/Output from previous day: 03/08 0701 - 03/09 0700 In: 1640 [P.O.:240; IV Piggyback:1400] Out: 775 [Urine:775] Intake/Output from this shift: Total I/O In: -  Out: 250 [Urine:250]  Labs:  Recent Labs  06/08/13 0445 06/09/13 0548  WBC 18.1* 16.0*  HGB 11.4* 10.4*  PLT 299 311  CREATININE  --  1.09   Estimated Creatinine Clearance: 70.7 ml/min (by C-G formula based on Cr of 1.09). No results found for this basename: VANCOTROUGH, VANCOPEAK, VANCORANDOM, GENTTROUGH, GENTPEAK, GENTRANDOM, TOBRATROUGH, TOBRAPEAK, TOBRARND, AMIKACINPEAK, AMIKACINTROU, AMIKACIN,  in the last 72 hours   Microbiology: Recent Results (from the past 720 hour(s))  CULTURE, BLOOD (ROUTINE X 2)     Status: None   Collection Time    06/04/13  5:54 AM      Result Value Ref Range Status   Specimen Description BLOOD LEFT ARM   Final   Special Requests BOTTLES DRAWN AEROBIC AND ANAEROBIC 8CC   Final   Culture NO GROWTH 5 DAYS   Final   Report Status 06/09/2013 FINAL   Final  CULTURE, BLOOD (ROUTINE X 2)     Status: None   Collection Time    06/04/13  6:02 AM      Result Value Ref Range Status   Specimen Description BLOOD RIGHT HAND   Final   Special Requests BOTTLES DRAWN AEROBIC AND ANAEROBIC 8CC   Final   Culture NO GROWTH 5 DAYS   Final   Report Status 06/09/2013 FINAL   Final    Medical History: Past Medical History  Diagnosis Date  . GERD (gastroesophageal reflux disease)   . COPD (chronic obstructive pulmonary disease)     On home oxygen  . Chronic  combined systolic and diastolic CHF (congestive heart failure)   . Essential hypertension, benign   . Cardiomyopathy     03/2013 Echo: EF 50-55%, Gr 1 DD, sev dil RV/RA with sev RV dysfxn, PASP . No evidence of RV thrombus.  . Right ventricular mural thrombus     On Coumadin  . Cor pulmonale     a. Severe RV dysfunction;  b. 04/2013 R heart cath: RA 17, RV 70/18 (22), PA 73/52.  . Coronary atherosclerosis of native coronary artery     a. 05/2012 MI/PCI: DES to OM [facility];  b. 04/2013 Cath/PCI: LM nl, LAD nonobs, LCX patent mid stent, RCA dom, 75m (2.5x16 Promus DES).  Marland Kitchen PAF (paroxysmal atrial fibrillation)     On amiodarone  . Spontaneous pneumothorax 2002    Bilateral  . PAT (paroxysmal atrial tachycardia)   . Mixed hyperlipidemia   . History of pneumonia   . Type II diabetes mellitus   . 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 amiodarone  200 mg Oral Daily  . arformoterol  15 mcg Nebulization BID  . budesonide  0.25 mg Nebulization BID  . ceFEPime (MAXIPIME) IV  1 g Intravenous 3 times per day  .  cloNIDine  0.1 mg Oral BID  . clotrimazole  10 mg Oral 5 X Daily  . digoxin  0.125 mg Oral q morning - 10a  . insulin aspart  0-15 Units Subcutaneous TID WC  . insulin aspart  0-5 Units Subcutaneous QHS  . insulin glargine  20 Units Subcutaneous QHS  . ipratropium  0.5 mg Nebulization Q6H  . levalbuterol  1.25 mg Nebulization 4 times per day  . linagliptin  5 mg Oral Daily  . losartan  100 mg Oral Daily  . metoprolol tartrate  75 mg Oral BID  . pantoprazole  40 mg Oral Daily  . PARoxetine  20 mg Oral Daily  . prasugrel  10 mg Oral Daily  . predniSONE  50 mg Oral Q breakfast  . ramipril  2.5 mg Oral Daily  . sodium chloride  3 mL Intravenous Q12H  . vancomycin  1,000 mg Intravenous Q12H  . warfarin  2 mg Oral q1800  . Warfarin - Physician Dosing Inpatient   Does  not apply q1800   Assessment: 58 yo M on empiric, broad-spectrum antibiotics for HCAP. He has productive cough.  WBC trending down.  Cx data negative to date.   Renal function has improved to baseline.   Cefepime 3/3>> Vancomycin 3/3>>  Goal of Therapy:  Vancomycin trough level 15-20 mcg/ml  Plan:  Vancomycin 1 GM IV every 12 hours Cefepime 1 GM IV every 8 hours Repeat Vancomycin trough at steady state Monitor renal function and cx data  Duration of therapy per MD -Recommend d/c Vanc and if appropriate change IV Cefepime to oral Levaquin to complete 7-10 days of total antibiotics.    Elson ClanLilliston, Aleysha Meckler Michelle 06/09/2013,3:01 PM

## 2013-06-09 NOTE — Progress Notes (Signed)
[id number] 

## 2013-06-09 NOTE — Progress Notes (Signed)
Danny [name], Danny [name]                ACCOUNT NO.:  [id number]  MEDICAL RECORD NO.:  [id number]  LOCATION:  A339                          FACILITY:  [facility]  PHYSICIAN:  Melvyn Novas, MDDATE OF BIRTH:  06-03-1955  DATE OF PROCEDURE: DATE OF DISCHARGE:                                PROGRESS NOTE   SUBJECTIVE:  The patient has cor pulmonale, status post lung reduction surgery, hypertension, hyperlipidemia, two-vessel coronary artery disease, status post stenting to the RCA and OM, severe right ventricular and atrial dilatation, right ventricular thrombus, currently anticoagulated with right upper lobe and right lower lobe infiltrate, currently treated with vanc and cefepime as well as nebulizer and Coumadin 2 mg per day.  OBJECTIVE:  VITAL SIGNS:  Blood pressure well controlled at 122/68, currently afebrile.  Less respiratory distress.  Sputum is less purulent, still light green as opposed to dark green.  O2 sats within normal parameters. LUNGS:  Show mild end-expiratory wheeze.  Scattered rhonchi.  No rales audible. HEART:  Regular rhythm.  No S3, S4 auscultated.  No heaves, thrills or rubs. EXTREMITIES:  Trace pedal edema.  LABORATORY DATA:  WBC yesterday was 18.1, hemoglobin 11.4, 79 neutrophils, INR is 1.9 on 2 mg per day.  PLAN:  Right now is continue antibiotics, awaiting pulmonary re- evaluation to see if antibiotics may be stopped and what course of oral antibiotics would be recommended.  The patient is a little less anxious at present.     Melvyn Novas, MD     RMD/MEDQ  D:  06/09/2013  T:  06/09/2013  Job:  [id number]

## 2013-06-10 ENCOUNTER — Inpatient Hospital Stay (HOSPITAL_COMMUNITY): Payer: Managed Care, Other (non HMO)

## 2013-06-10 LAB — GLUCOSE, CAPILLARY
Glucose-Capillary: 104 mg/dL — ABNORMAL HIGH (ref 70–99)
Glucose-Capillary: 136 mg/dL — ABNORMAL HIGH (ref 70–99)
Glucose-Capillary: 179 mg/dL — ABNORMAL HIGH (ref 70–99)
Glucose-Capillary: 161 mg/dL — ABNORMAL HIGH (ref 70–99)

## 2013-06-10 LAB — CBC WITH DIFFERENTIAL/PLATELET
Basophils Absolute: 0 10*3/uL (ref 0.0–0.1)
Basophils Relative: 0 % (ref 0–1)
EOS ABS: 0 10*3/uL (ref 0.0–0.7)
Eosinophils Relative: 0 % (ref 0–5)
HEMATOCRIT: 35.5 % — AB (ref 39.0–52.0)
HEMOGLOBIN: 11.2 g/dL — AB (ref 13.0–17.0)
LYMPHS ABS: 2.3 10*3/uL (ref 0.7–4.0)
Lymphocytes Relative: 14 % (ref 12–46)
MCH: 29.5 pg (ref 26.0–34.0)
MCHC: 31.5 g/dL (ref 30.0–36.0)
MCV: 93.4 fL (ref 78.0–100.0)
MONOS PCT: 8 % (ref 3–12)
Monocytes Absolute: 1.3 10*3/uL — ABNORMAL HIGH (ref 0.1–1.0)
Neutro Abs: 12.2 10*3/uL — ABNORMAL HIGH (ref 1.7–7.7)
Neutrophils Relative %: 77 % (ref 43–77)
Platelets: 333 10*3/uL (ref 150–400)
RBC: 3.8 MIL/uL — ABNORMAL LOW (ref 4.22–5.81)
RDW: 16.9 % — ABNORMAL HIGH (ref 11.5–15.5)
WBC: 15.7 10*3/uL — ABNORMAL HIGH (ref 4.0–10.5)

## 2013-06-10 LAB — PROTIME-INR
INR: 1.87 — ABNORMAL HIGH (ref 0.00–1.49)
PROTHROMBIN TIME: 21 s — AB (ref 11.6–15.2)

## 2013-06-10 NOTE — Progress Notes (Signed)
Pt got up from chair and moved to bed for breathing treatment and spo2 dropped down and treatment given pt placed on home unit BIPAP machine will continue to monitor.

## 2013-06-10 NOTE — Progress Notes (Signed)
Utilization Review Complete  

## 2013-06-10 NOTE — Progress Notes (Signed)
Subjective: This is documentation of my visit from 06/09/2013. He says he feels better. He has no new complaints. His breathing is improving.  Objective: Vital signs in last 24 hours: Temp:  [97.7 F (36.5 C)-98.4 F (36.9 C)] 97.9 F (36.6 C) (03/10 0631) Pulse Rate:  [64-70] 64 (03/10 0817) Resp:  [18-20] 20 (03/10 0631) BP: (143-155)/(79-95) 143/90 mmHg (03/10 0817) SpO2:  [83 %-95 %] 83 % (03/10 0747) Weight:  [79.6 kg (175 lb 7.8 oz)-80.559 kg (177 lb 9.6 oz)] 80.559 kg (177 lb 9.6 oz) (03/10 0631) Weight change: 0.5 kg (1 lb 1.6 oz) Last BM Date: 06/09/13  Intake/Output from previous day: 03/09 0701 - 03/10 0700 In: 220 [P.O.:120; IV Piggyback:100] Out: 550 [Urine:550]  PHYSICAL EXAM General appearance: alert, cooperative and mild distress Resp: rhonchi bilaterally Cardio: regular rate and rhythm, S1, S2 normal, no murmur, click, rub or gallop GI: soft, non-tender; bowel sounds normal; no masses,  no organomegaly Extremities: 1-2+  Lab Results:    Basic Metabolic Panel:  Recent Labs  14/78/29 0548  NA 140  K 4.4  CL 99  CO2 36*  GLUCOSE 90  BUN 25*  CREATININE 1.09  CALCIUM 8.8   Liver Function Tests: No results found for this basename: AST, ALT, ALKPHOS, BILITOT, PROT, ALBUMIN,  in the last 72 hours No results found for this basename: LIPASE, AMYLASE,  in the last 72 hours No results found for this basename: AMMONIA,  in the last 72 hours CBC:  Recent Labs  06/08/13 0445 06/09/13 0548  WBC 18.1* 16.0*  NEUTROABS 14.3*  --   HGB 11.4* 10.4*  HCT 37.0* 33.4*  MCV 94.4 94.1  PLT 299 311   Cardiac Enzymes: No results found for this basename: CKTOTAL, CKMB, CKMBINDEX, TROPONINI,  in the last 72 hours BNP: No results found for this basename: PROBNP,  in the last 72 hours D-Dimer: No results found for this basename: DDIMER,  in the last 72 hours CBG:  Recent Labs  06/08/13 2122 06/09/13 0734 06/09/13 1125 06/09/13 1638 06/09/13 2125  06/10/13 0732  GLUCAP 189* 100* 111* 184* 220* 104*   Hemoglobin A1C: No results found for this basename: HGBA1C,  in the last 72 hours Fasting Lipid Panel: No results found for this basename: CHOL, HDL, LDLCALC, TRIG, CHOLHDL, LDLDIRECT,  in the last 72 hours Thyroid Function Tests: No results found for this basename: TSH, T4TOTAL, FREET4, T3FREE, THYROIDAB,  in the last 72 hours Anemia Panel: No results found for this basename: VITAMINB12, FOLATE, FERRITIN, TIBC, IRON, RETICCTPCT,  in the last 72 hours Coagulation:  Recent Labs  06/09/13 0548 06/10/13 0633  LABPROT 20.7* 21.0*  INR 1.84* 1.87*   Urine Drug Screen: Drugs of Abuse  No results found for this basename: labopia, cocainscrnur, labbenz, amphetmu, thcu, labbarb    Alcohol Level: No results found for this basename: ETH,  in the last 72 hours Urinalysis: No results found for this basename: COLORURINE, APPERANCEUR, LABSPEC, PHURINE, GLUCOSEU, HGBUR, BILIRUBINUR, KETONESUR, PROTEINUR, UROBILINOGEN, NITRITE, LEUKOCYTESUR,  in the last 72 hours Misc. Labs:  ABGS No results found for this basename: PHART, PCO2, PO2ART, TCO2, HCO3,  in the last 72 hours CULTURES Recent Results (from the past 240 hour(s))  CULTURE, BLOOD (ROUTINE X 2)     Status: None   Collection Time    06/04/13  5:54 AM      Result Value Ref Range Status   Specimen Description BLOOD LEFT ARM   Final   Special Requests BOTTLES DRAWN  AEROBIC AND ANAEROBIC 8CC   Final   Culture NO GROWTH 5 DAYS   Final   Report Status 06/09/2013 FINAL   Final  CULTURE, BLOOD (ROUTINE X 2)     Status: None   Collection Time    06/04/13  6:02 AM      Result Value Ref Range Status   Specimen Description BLOOD RIGHT HAND   Final   Special Requests BOTTLES DRAWN AEROBIC AND ANAEROBIC 8CC   Final   Culture NO GROWTH 5 DAYS   Final   Report Status 06/09/2013 FINAL   Final   Studies/Results: No results found.  Medications:  Prior to Admission:  Prescriptions prior  to admission  Medication Sig Dispense Refill  . Aclidinium Bromide (TUDORZA PRESSAIR) 400 MCG/ACT AEPB Inhale 1 puff into the lungs 2 (two) times daily.      Marland Kitchen albuterol (PROVENTIL HFA;VENTOLIN HFA) 108 (90 BASE) MCG/ACT inhaler Inhale 2 puffs into the lungs every 6 (six) hours as needed for wheezing or shortness of breath.       Marland Kitchen amiodarone (PACERONE) 200 MG tablet Take 1 tablet (200 mg total) by mouth daily.  30 tablet  6  . arformoterol (BROVANA) 15 MCG/2ML NEBU Take 15 mcg by nebulization 2 (two) times daily.      . budesonide (PULMICORT) 0.25 MG/2ML nebulizer solution Take 0.25 mg by nebulization 2 (two) times daily.      . cloNIDine (CATAPRES) 0.1 MG tablet Take 1 tablet (0.1 mg total) by mouth 2 (two) times daily.      . clotrimazole (MYCELEX) 10 MG troche Take 10 mg by mouth daily.       . digoxin (LANOXIN) 0.125 MG tablet Take 0.125 mg by mouth every morning.      . levalbuterol (XOPENEX) 1.25 MG/0.5ML nebulizer solution Take 1.25 mg by nebulization every 6 (six) hours.  1 each  12  . linagliptin (TRADJENTA) 5 MG TABS tablet Take 5 mg by mouth daily.      Marland Kitchen losartan (COZAAR) 100 MG tablet Take 100 mg by mouth every morning.       . metoprolol (LOPRESSOR) 50 MG tablet Take 1.5 tablets (75 mg total) by mouth 2 (two) times daily.  90 tablet  6  . omeprazole (PRILOSEC) 20 MG capsule Take 20 mg by mouth 2 (two) times daily.       . prasugrel (EFFIENT) 10 MG TABS tablet Take 1 tablet (10 mg total) by mouth daily.  30 tablet  6  . predniSONE (DELTASONE) 10 MG tablet Take 1 tablet (10 mg total) by mouth See admin instructions. 30mg  a day for 2 days, then 20mg  a day for 2 days, then resume your 10mg  a day.      . ramipril (ALTACE) 2.5 MG capsule Take 2.5 mg by mouth daily.       . rosuvastatin (CRESTOR) 10 MG tablet Take 10 mg by mouth every evening.      . sodium chloride (OCEAN) 0.65 % nasal spray Place 1 spray into the nose every 6 (six) hours as needed for congestion.      . [DISCONTINUED]  acetaminophen (TYLENOL) 500 MG tablet Take 1,000 mg by mouth every 6 (six) hours as needed for moderate pain.       . [DISCONTINUED] antiseptic oral rinse (BIOTENE) LIQD 15 mLs by Mouth Rinse route 4 (four) times daily as needed for dry mouth.      . [DISCONTINUED] furosemide (LASIX) 40 MG tablet Take 1 tablet (40 mg  total) by mouth 2 (two) times daily. *takes an extra 40mg  tablet if needed for swelling  60 tablet  6  . [DISCONTINUED] potassium chloride (K-DUR) 10 MEQ tablet Take 1 tablet (10 mEq total) by mouth daily.  30 tablet  6  . [DISCONTINUED] warfarin (COUMADIN) 2.5 MG tablet Take 2.5 mg by mouth daily at 6 PM.       . nitroGLYCERIN (NITROSTAT) 0.4 MG SL tablet Place 1 tablet (0.4 mg total) under the tongue every 5 (five) minutes x 3 doses as needed for chest pain.  25 tablet  3   Scheduled: Marland Kitchen. Aclidinium Bromide  1 puff Inhalation Daily  . amiodarone  200 mg Oral Daily  . arformoterol  15 mcg Nebulization BID  . budesonide  0.25 mg Nebulization BID  . ceFEPime (MAXIPIME) IV  1 g Intravenous 3 times per day  . cloNIDine  0.1 mg Oral BID  . clotrimazole  10 mg Oral 5 X Daily  . digoxin  0.125 mg Oral q morning - 10a  . insulin aspart  0-15 Units Subcutaneous TID WC  . insulin aspart  0-5 Units Subcutaneous QHS  . insulin glargine  20 Units Subcutaneous QHS  . ipratropium  0.5 mg Nebulization Q6H  . levalbuterol  1.25 mg Nebulization 4 times per day  . linagliptin  5 mg Oral Daily  . losartan  100 mg Oral Daily  . metoprolol tartrate  75 mg Oral BID  . pantoprazole  40 mg Oral Daily  . PARoxetine  20 mg Oral Daily  . prasugrel  10 mg Oral Daily  . predniSONE  50 mg Oral Q breakfast  . ramipril  2.5 mg Oral Daily  . sodium chloride  3 mL Intravenous Q12H  . vancomycin  1,000 mg Intravenous Q12H  . warfarin  2 mg Oral q1800  . Warfarin - Physician Dosing Inpatient   Does not apply q1800   Continuous:  ZOX:WRUEAVPRN:sodium chloride, acetaminophen, albuterol, antiseptic oral rinse,  guaiFENesin-dextromethorphan, nitroGLYCERIN, simethicone, sodium chloride  Assesment: He has multiple medical problems as listed. He now has healthcare associated pneumonia which is being treated and he does seem to be improving. He still has an elevated white blood cell count but he is on steroids. His other problems appear fairly stable but severe Principal Problem:   COPD exacerbation Active Problems:   Coronary atherosclerosis of native coronary artery   RV (right ventricular) mural thrombus   Long term (current) use of anticoagulants   COPD (chronic obstructive pulmonary disease)   Secondary cardiomyopathy, unspecified   Pulmonary hypertension   Atrial fibrillation   PAT (paroxysmal atrial tachycardia)   Chronic respiratory failure   Acute on chronic respiratory failure with hypoxia   Chronic combined systolic and diastolic CHF (congestive heart failure)   Steroid-dependent COPD   Renal insufficiency, mild    Plan: Continue treatments    LOS: 15 days   Halcyon Heck L 06/10/2013, 8:54 AM

## 2013-06-10 NOTE — Progress Notes (Signed)
Subjective: He says he feels significantly better. His white blood count was up yesterday so we'll repeat that today. He's not having as much her with cough and congestion.  Objective: Vital signs in last 24 hours: Temp:  [97.7 F (36.5 C)-98.4 F (36.9 C)] 97.9 F (36.6 C) (03/10 0631) Pulse Rate:  [64-70] 64 (03/10 0817) Resp:  [18-20] 20 (03/10 0631) BP: (143-155)/(79-95) 143/90 mmHg (03/10 0817) SpO2:  [83 %-95 %] 83 % (03/10 0747) Weight:  [79.6 kg (175 lb 7.8 oz)-80.559 kg (177 lb 9.6 oz)] 80.559 kg (177 lb 9.6 oz) (03/10 0631) Weight change: 0.5 kg (1 lb 1.6 oz) Last BM Date: 06/09/13  Intake/Output from previous day: 03/09 0701 - 03/10 0700 In: 220 [P.O.:120; IV Piggyback:100] Out: 550 [Urine:550]  PHYSICAL EXAM General appearance: alert, cooperative and mild distress Resp: rhonchi bilaterally Cardio: irregularly irregular rhythm GI: soft, non-tender; bowel sounds normal; no masses,  no organomegaly Extremities: 2+  Lab Results:    Basic Metabolic Panel:  Recent Labs  49/67/59 0548  NA 140  K 4.4  CL 99  CO2 36*  GLUCOSE 90  BUN 25*  CREATININE 1.09  CALCIUM 8.8   Liver Function Tests: No results found for this basename: AST, ALT, ALKPHOS, BILITOT, PROT, ALBUMIN,  in the last 72 hours No results found for this basename: LIPASE, AMYLASE,  in the last 72 hours No results found for this basename: AMMONIA,  in the last 72 hours CBC:  Recent Labs  06/08/13 0445 06/09/13 0548  WBC 18.1* 16.0*  NEUTROABS 14.3*  --   HGB 11.4* 10.4*  HCT 37.0* 33.4*  MCV 94.4 94.1  PLT 299 311   Cardiac Enzymes: No results found for this basename: CKTOTAL, CKMB, CKMBINDEX, TROPONINI,  in the last 72 hours BNP: No results found for this basename: PROBNP,  in the last 72 hours D-Dimer: No results found for this basename: DDIMER,  in the last 72 hours CBG:  Recent Labs  06/08/13 2122 06/09/13 0734 06/09/13 1125 06/09/13 1638 06/09/13 2125 06/10/13 0732   GLUCAP 189* 100* 111* 184* 220* 104*   Hemoglobin A1C: No results found for this basename: HGBA1C,  in the last 72 hours Fasting Lipid Panel: No results found for this basename: CHOL, HDL, LDLCALC, TRIG, CHOLHDL, LDLDIRECT,  in the last 72 hours Thyroid Function Tests: No results found for this basename: TSH, T4TOTAL, FREET4, T3FREE, THYROIDAB,  in the last 72 hours Anemia Panel: No results found for this basename: VITAMINB12, FOLATE, FERRITIN, TIBC, IRON, RETICCTPCT,  in the last 72 hours Coagulation:  Recent Labs  06/09/13 0548 06/10/13 0633  LABPROT 20.7* 21.0*  INR 1.84* 1.87*   Urine Drug Screen: Drugs of Abuse  No results found for this basename: labopia, cocainscrnur, labbenz, amphetmu, thcu, labbarb    Alcohol Level: No results found for this basename: ETH,  in the last 72 hours Urinalysis: No results found for this basename: COLORURINE, APPERANCEUR, LABSPEC, PHURINE, GLUCOSEU, HGBUR, BILIRUBINUR, KETONESUR, PROTEINUR, UROBILINOGEN, NITRITE, LEUKOCYTESUR,  in the last 72 hours Misc. Labs:  ABGS No results found for this basename: PHART, PCO2, PO2ART, TCO2, HCO3,  in the last 72 hours CULTURES Recent Results (from the past 240 hour(s))  CULTURE, BLOOD (ROUTINE X 2)     Status: None   Collection Time    06/04/13  5:54 AM      Result Value Ref Range Status   Specimen Description BLOOD LEFT ARM   Final   Special Requests BOTTLES DRAWN AEROBIC AND ANAEROBIC 8CC  Final   Culture NO GROWTH 5 DAYS   Final   Report Status 06/09/2013 FINAL   Final  CULTURE, BLOOD (ROUTINE X 2)     Status: None   Collection Time    06/04/13  6:02 AM      Result Value Ref Range Status   Specimen Description BLOOD RIGHT HAND   Final   Special Requests BOTTLES DRAWN AEROBIC AND ANAEROBIC 8CC   Final   Culture NO GROWTH 5 DAYS   Final   Report Status 06/09/2013 FINAL   Final   Studies/Results: No results found.  Medications:  Prior to Admission:  Prescriptions prior to admission   Medication Sig Dispense Refill  . Aclidinium Bromide (TUDORZA PRESSAIR) 400 MCG/ACT AEPB Inhale 1 puff into the lungs 2 (two) times daily.      Marland Kitchen. albuterol (PROVENTIL HFA;VENTOLIN HFA) 108 (90 BASE) MCG/ACT inhaler Inhale 2 puffs into the lungs every 6 (six) hours as needed for wheezing or shortness of breath.       Marland Kitchen. amiodarone (PACERONE) 200 MG tablet Take 1 tablet (200 mg total) by mouth daily.  30 tablet  6  . arformoterol (BROVANA) 15 MCG/2ML NEBU Take 15 mcg by nebulization 2 (two) times daily.      . budesonide (PULMICORT) 0.25 MG/2ML nebulizer solution Take 0.25 mg by nebulization 2 (two) times daily.      . cloNIDine (CATAPRES) 0.1 MG tablet Take 1 tablet (0.1 mg total) by mouth 2 (two) times daily.      . clotrimazole (MYCELEX) 10 MG troche Take 10 mg by mouth daily.       . digoxin (LANOXIN) 0.125 MG tablet Take 0.125 mg by mouth every morning.      . levalbuterol (XOPENEX) 1.25 MG/0.5ML nebulizer solution Take 1.25 mg by nebulization every 6 (six) hours.  1 each  12  . linagliptin (TRADJENTA) 5 MG TABS tablet Take 5 mg by mouth daily.      Marland Kitchen. losartan (COZAAR) 100 MG tablet Take 100 mg by mouth every morning.       . metoprolol (LOPRESSOR) 50 MG tablet Take 1.5 tablets (75 mg total) by mouth 2 (two) times daily.  90 tablet  6  . omeprazole (PRILOSEC) 20 MG capsule Take 20 mg by mouth 2 (two) times daily.       . prasugrel (EFFIENT) 10 MG TABS tablet Take 1 tablet (10 mg total) by mouth daily.  30 tablet  6  . predniSONE (DELTASONE) 10 MG tablet Take 1 tablet (10 mg total) by mouth See admin instructions. 30mg  a day for 2 days, then 20mg  a day for 2 days, then resume your 10mg  a day.      . ramipril (ALTACE) 2.5 MG capsule Take 2.5 mg by mouth daily.       . rosuvastatin (CRESTOR) 10 MG tablet Take 10 mg by mouth every evening.      . sodium chloride (OCEAN) 0.65 % nasal spray Place 1 spray into the nose every 6 (six) hours as needed for congestion.      . [DISCONTINUED] acetaminophen  (TYLENOL) 500 MG tablet Take 1,000 mg by mouth every 6 (six) hours as needed for moderate pain.       . [DISCONTINUED] antiseptic oral rinse (BIOTENE) LIQD 15 mLs by Mouth Rinse route 4 (four) times daily as needed for dry mouth.      . [DISCONTINUED] furosemide (LASIX) 40 MG tablet Take 1 tablet (40 mg total) by mouth 2 (two) times  daily. *takes an extra 40mg  tablet if needed for swelling  60 tablet  6  . [DISCONTINUED] potassium chloride (K-DUR) 10 MEQ tablet Take 1 tablet (10 mEq total) by mouth daily.  30 tablet  6  . [DISCONTINUED] warfarin (COUMADIN) 2.5 MG tablet Take 2.5 mg by mouth daily at 6 PM.       . nitroGLYCERIN (NITROSTAT) 0.4 MG SL tablet Place 1 tablet (0.4 mg total) under the tongue every 5 (five) minutes x 3 doses as needed for chest pain.  25 tablet  3   Scheduled: Marland Kitchen Aclidinium Bromide  1 puff Inhalation Daily  . amiodarone  200 mg Oral Daily  . arformoterol  15 mcg Nebulization BID  . budesonide  0.25 mg Nebulization BID  . ceFEPime (MAXIPIME) IV  1 g Intravenous 3 times per day  . cloNIDine  0.1 mg Oral BID  . clotrimazole  10 mg Oral 5 X Daily  . digoxin  0.125 mg Oral q morning - 10a  . insulin aspart  0-15 Units Subcutaneous TID WC  . insulin aspart  0-5 Units Subcutaneous QHS  . insulin glargine  20 Units Subcutaneous QHS  . ipratropium  0.5 mg Nebulization Q6H  . levalbuterol  1.25 mg Nebulization 4 times per day  . linagliptin  5 mg Oral Daily  . losartan  100 mg Oral Daily  . metoprolol tartrate  75 mg Oral BID  . pantoprazole  40 mg Oral Daily  . PARoxetine  20 mg Oral Daily  . prasugrel  10 mg Oral Daily  . predniSONE  50 mg Oral Q breakfast  . ramipril  2.5 mg Oral Daily  . sodium chloride  3 mL Intravenous Q12H  . vancomycin  1,000 mg Intravenous Q12H  . warfarin  2 mg Oral q1800  . Warfarin - Physician Dosing Inpatient   Does not apply q1800   Continuous:  ZOX:WRUEAV chloride, acetaminophen, albuterol, antiseptic oral rinse,  guaiFENesin-dextromethorphan, nitroGLYCERIN, simethicone, sodium chloride  Assesment: He has healthcare associated pneumonia. He seems to be improving. He's going to have another chest x-ray to document that it looks better. His white blood count was up yesterday so that will be repeated. Overall I think he is approaching baseline Principal Problem:   COPD exacerbation Active Problems:   Coronary atherosclerosis of native coronary artery   RV (right ventricular) mural thrombus   Long term (current) use of anticoagulants   COPD (chronic obstructive pulmonary disease)   Secondary cardiomyopathy, unspecified   Pulmonary hypertension   Atrial fibrillation   PAT (paroxysmal atrial tachycardia)   Chronic respiratory failure   Acute on chronic respiratory failure with hypoxia   Chronic combined systolic and diastolic CHF (congestive heart failure)   Steroid-dependent COPD   Renal insufficiency, mild    Plan: Chest x-ray and CBC today    LOS: 15 days   Danny [name] L 06/10/2013, 8:57 AM

## 2013-06-10 NOTE — Progress Notes (Signed)
[id number] 

## 2013-06-11 ENCOUNTER — Encounter (HOSPITAL_COMMUNITY): Payer: Managed Care, Other (non HMO)

## 2013-06-11 LAB — BASIC METABOLIC PANEL
BUN: 23 mg/dL (ref 6–23)
CALCIUM: 8.8 mg/dL (ref 8.4–10.5)
CO2: 34 mEq/L — ABNORMAL HIGH (ref 19–32)
Chloride: 98 mEq/L (ref 96–112)
Creatinine, Ser: 1 mg/dL (ref 0.50–1.35)
GFR calc Af Amer: >90 mL/min (ref >90)
GFR calc non Af Amer: 82 mL/min — ABNORMAL LOW (ref >90)
GLUCOSE: 98 mg/dL (ref 70–99)
Potassium: 4.3 mEq/L (ref 3.7–5.3)
Sodium: 137 mEq/L (ref 137–147)

## 2013-06-11 LAB — CBC
HEMATOCRIT: 32.7 % — AB (ref 39.0–52.0)
Hemoglobin: 10.5 g/dL — ABNORMAL LOW (ref 13.0–17.0)
MCH: 29.5 pg (ref 26.0–34.0)
MCHC: 32.1 g/dL (ref 30.0–36.0)
MCV: 91.9 fL (ref 78.0–100.0)
Platelets: 285 10*3/uL (ref 150–400)
RBC: 3.56 MIL/uL — AB (ref 4.22–5.81)
RDW: 16.6 % — ABNORMAL HIGH (ref 11.5–15.5)
WBC: 13 10*3/uL — AB (ref 4.0–10.5)

## 2013-06-11 LAB — GLUCOSE, CAPILLARY: Glucose-Capillary: 94 mg/dL (ref 70–99)

## 2013-06-11 LAB — PROTIME-INR
INR: 2.09 — ABNORMAL HIGH (ref 0.00–1.49)
Prothrombin Time: 22.8 seconds — ABNORMAL HIGH (ref 11.6–15.2)

## 2013-06-11 MED ORDER — WARFARIN SODIUM 2 MG PO TABS: 2.0000 mg | ORAL_TABLET | Freq: Every day | ORAL | Status: DC

## 2013-06-11 MED ORDER — AZITHROMYCIN 250 MG PO TABS: ORAL_TABLET | ORAL | Status: DC

## 2013-06-11 MED ORDER — ALBUTEROL SULFATE (2.5 MG/3ML) 0.083% IN NEBU: 2.5000 mg | INHALATION_SOLUTION | RESPIRATORY_TRACT | Status: DC | PRN

## 2013-06-11 MED ORDER — ACLIDINIUM BROMIDE 400 MCG/ACT IN AEPB: 1.0000 | INHALATION_SPRAY | Freq: Two times a day (BID) | RESPIRATORY_TRACT | Status: AC

## 2013-06-11 MED ORDER — PAROXETINE HCL 20 MG PO TABS: 20.0000 mg | ORAL_TABLET | Freq: Every day | ORAL | Status: AC

## 2013-06-11 NOTE — Progress Notes (Signed)
He is being discharged today. His chest x-ray yesterday looked better. He will need to have followup chest x-ray to document complete resolution of the pneumonia and I will plan to see him in my office in about 2 weeks and arrange that.

## 2013-06-11 NOTE — Progress Notes (Signed)
Patient being d/c home with prescriptions. Faxes to [facility] and given to patient. IV cath removed and intact. No pain/swelling at site. Explained instructions and patient verbalizes understanding.  Follow up with MD in two weeks.

## 2013-06-11 NOTE — Discharge Summary (Signed)
[id number] 

## 2013-06-11 NOTE — Progress Notes (Signed)
NAMEFRANSICO, OMAHONY NO.:  [id number]  MEDICAL RECORD NO.:  [id number]  LOCATION:  A339                          FACILITY:  [facility]  PHYSICIAN:  Melvyn Novas, MDDATE OF BIRTH:  Apr 20, 1955  DATE OF PROCEDURE: DATE OF DISCHARGE:                                PROGRESS NOTE   Appreciate Pulmonary consult.  Creatinine is 1.09 today.  WBCs 15.7, hemoglobin 11.2.  Neutrophils at 77.  Chest x-ray reveals resolving right upper lobe infiltrate with bullous emphysema.  No evidence of right lower lobe infiltrate.  The patient is still continued on vancomycin and cefepime.  Await Pulmonary recommendations on possible outpatient antibiotics induration.  Blood pressure is 110/70.  He is afebrile.  Lungs show coarse rhonchi, mild end-expiratory wheeze.  No rales appreciable.  Heart:  Regular rhythm.  No S3, S4.  No heaves, thrills, or rubs.  The patient hemodynamically stable, resolving pneumonia, again with the same medical issues of cor pulmonale, right ventricular thrombus, anticoagulation, hypertension, hyperlipidemia, two- vessel coronary artery disease with stenting in the OM and RCA.  The patient seems to be improving.     Melvyn Novas, MD     RMD/MEDQ  D:  06/10/2013  T:  06/11/2013  Job:  [id number]

## 2013-06-12 NOTE — Discharge Summary (Signed)
Danny [name], Danny [name] NO.:  [id number]  MEDICAL RECORD NO.:  [id number]  LOCATION:                                 FACILITY:  PHYSICIAN:  Melvyn Novas, MDDATE OF BIRTH:  January 16, 1956  DATE OF ADMISSION:  05/26/2013 DATE OF DISCHARGE:  03/11/2015LH                              DISCHARGE SUMMARY   The patient has extensive multiple problems including, 1. Cor pulmonale, status post lung reduction surgery. 2. Severe COPD.  The patient denied lung transplant surgery. 3. Severely dilated right atrium and right ventricle with right     ventricular thrombus. 4. Anticoagulation. 5. Coronary artery disease, status post stenting to RCA and OM1. 6. Hyperlipidemia. 7. Hypertension.  The patient was admitted to the hospital with multiple issues severe bronchospasm this is brought under control.  He then had right-sided volume overload issues which were resolved with some mild diuresis and compression hose.  He subsequently was developed a right upper lobe and a right lower lobe pneumonia with elevated white count, was placed on vancomycin and cefepime intravenously for healthcare hospital-acquired pneumonia.  This resolved gradually over an 8-9 day period with aggressive nebulizer therapy.  His Coumadin was reduced while in the hospital from 2.5-2 mg per day with a therapeutic INR.  He appeared hemodynamically stable.  There was no evidence of ischemia, angina, or anginal equivalents.  The patient had significant anxiety as he noticed he has end-stage disease and we will consult home health care to provide nebulizer therapy, physical therapy, nursing care as well as continuation of oral antibiotics in the form of Zithromax daily.  The patient should be seen in follow up with home health nurse as well as myself in the office in 2-3 day time to assess anticoagulation status, right ventricular volume overload status, electrolytes, and resolution of bilobar  pneumonia.  DISCHARGE MEDICINES: 1. Tudorza 400 mcg 1 puff q.12 hours. 2. DuoNeb nebulizer q.4h. p.r.n. 3. Amiodarone 200 mg p.o. daily. 4. Pulmicort 0.25 mg nebulizer 2 times daily. 5. Zithromax 250 mg 2 tabs p.o. today and 1 tab p.o. daily for 14 days     successively. 6. Lanoxin 0.125 mg p.o. daily. 7. Clonidine 0.1 mg p.o. b.i.d. 8. Mycelex Troches 10 mg daily p.r.n. 9. Lasix was held. 10.Robitussin DM 1 teaspoon 5 mL q.4h. p.r.n. 11.Tradjenta 5 mg p.o. daily. 12.Cozaar 100 mg p.o. daily. 13.Lopressor 50 mg tablets 1-1/2 tablets which is 75 mg p.o. b.i.d. 14.Sublingual nitroglycerin 0.4 p.r.n. 15.Prilosec 20 mg p.o. b.i.d. 16.Paxil 20 mg p.o. daily. 17.Effient 10 mg p.o. daily. 18.Prednisone 10 mg p.o. daily. 19.Ramipril 2.5 mg p.o. daily. 20.Crestor 10 mg p.o. daily. 21.Sodium chloride 0.65 nasal spray each naris q.i.d. p.r.n. 22.Warfarin 2 mg p.o. daily.     Melvyn Novas, MD     RMD/MEDQ  D:  06/11/2013  T:  06/12/2013  Job:  [id number]

## 2013-06-13 ENCOUNTER — Encounter (HOSPITAL_COMMUNITY): Payer: Managed Care, Other (non HMO)

## 2013-06-13 NOTE — Progress Notes (Signed)
Discharge summary sent to payer through [facility]  

## 2013-06-16 ENCOUNTER — Encounter (HOSPITAL_COMMUNITY): Payer: Managed Care, Other (non HMO)

## 2013-06-17 ENCOUNTER — Emergency Department (HOSPITAL_COMMUNITY): Payer: Managed Care, Other (non HMO)

## 2013-06-17 ENCOUNTER — Encounter (HOSPITAL_COMMUNITY): Payer: Self-pay | Admitting: Emergency Medicine

## 2013-06-17 ENCOUNTER — Inpatient Hospital Stay (HOSPITAL_COMMUNITY)
Admission: EM | Admit: 2013-06-17 | Discharge: 2013-06-23 | DRG: 280 | Disposition: A | Discharge status: Hospice | Payer: Managed Care, Other (non HMO) | Attending: Cardiology | Admitting: Cardiology

## 2013-06-17 DIAGNOSIS — R0601 Orthopnea: Secondary | ICD-10-CM | POA: Diagnosis present

## 2013-06-17 DIAGNOSIS — I4729 Other ventricular tachycardia: Secondary | ICD-10-CM | POA: Diagnosis present

## 2013-06-17 DIAGNOSIS — R079 Chest pain, unspecified: Secondary | ICD-10-CM

## 2013-06-17 DIAGNOSIS — I472 Ventricular tachycardia, unspecified: Secondary | ICD-10-CM | POA: Diagnosis present

## 2013-06-17 DIAGNOSIS — I471 Supraventricular tachycardia: Secondary | ICD-10-CM

## 2013-06-17 DIAGNOSIS — Z515 Encounter for palliative care: Secondary | ICD-10-CM

## 2013-06-17 DIAGNOSIS — Z87891 Personal history of nicotine dependence: Secondary | ICD-10-CM

## 2013-06-17 DIAGNOSIS — I272 Pulmonary hypertension, unspecified: Secondary | ICD-10-CM

## 2013-06-17 DIAGNOSIS — IMO0002 Reserved for concepts with insufficient information to code with codable children: Secondary | ICD-10-CM

## 2013-06-17 DIAGNOSIS — I429 Cardiomyopathy, unspecified: Secondary | ICD-10-CM

## 2013-06-17 DIAGNOSIS — I10 Essential (primary) hypertension: Secondary | ICD-10-CM

## 2013-06-17 DIAGNOSIS — I513 Intracardiac thrombosis, not elsewhere classified: Secondary | ICD-10-CM

## 2013-06-17 DIAGNOSIS — J9621 Acute and chronic respiratory failure with hypoxia: Secondary | ICD-10-CM

## 2013-06-17 DIAGNOSIS — N289 Disorder of kidney and ureter, unspecified: Secondary | ICD-10-CM

## 2013-06-17 DIAGNOSIS — I5042 Chronic combined systolic (congestive) and diastolic (congestive) heart failure: Secondary | ICD-10-CM

## 2013-06-17 DIAGNOSIS — Z66 Do not resuscitate: Secondary | ICD-10-CM | POA: Diagnosis present

## 2013-06-17 DIAGNOSIS — I50810 Right heart failure, unspecified: Secondary | ICD-10-CM

## 2013-06-17 DIAGNOSIS — J9610 Chronic respiratory failure, unspecified whether with hypoxia or hypercapnia: Secondary | ICD-10-CM

## 2013-06-17 DIAGNOSIS — J441 Chronic obstructive pulmonary disease with (acute) exacerbation: Secondary | ICD-10-CM

## 2013-06-17 DIAGNOSIS — J9690 Respiratory failure, unspecified, unspecified whether with hypoxia or hypercapnia: Secondary | ICD-10-CM | POA: Diagnosis present

## 2013-06-17 DIAGNOSIS — I214 Non-ST elevation (NSTEMI) myocardial infarction: Principal | ICD-10-CM

## 2013-06-17 DIAGNOSIS — Z7901 Long term (current) use of anticoagulants: Secondary | ICD-10-CM

## 2013-06-17 DIAGNOSIS — I129 Hypertensive chronic kidney disease with stage 1 through stage 4 chronic kidney disease, or unspecified chronic kidney disease: Secondary | ICD-10-CM | POA: Diagnosis present

## 2013-06-17 DIAGNOSIS — N182 Chronic kidney disease, stage 2 (mild): Secondary | ICD-10-CM | POA: Diagnosis present

## 2013-06-17 DIAGNOSIS — Z9981 Dependence on supplemental oxygen: Secondary | ICD-10-CM

## 2013-06-17 DIAGNOSIS — I2789 Other specified pulmonary heart diseases: Secondary | ICD-10-CM | POA: Diagnosis present

## 2013-06-17 DIAGNOSIS — Z9861 Coronary angioplasty status: Secondary | ICD-10-CM

## 2013-06-17 DIAGNOSIS — D649 Anemia, unspecified: Secondary | ICD-10-CM | POA: Diagnosis present

## 2013-06-17 DIAGNOSIS — R778 Other specified abnormalities of plasma proteins: Secondary | ICD-10-CM

## 2013-06-17 DIAGNOSIS — I5033 Acute on chronic diastolic (congestive) heart failure: Secondary | ICD-10-CM

## 2013-06-17 DIAGNOSIS — E119 Type 2 diabetes mellitus without complications: Secondary | ICD-10-CM

## 2013-06-17 DIAGNOSIS — I4891 Unspecified atrial fibrillation: Secondary | ICD-10-CM

## 2013-06-17 DIAGNOSIS — J439 Emphysema, unspecified: Secondary | ICD-10-CM | POA: Diagnosis present

## 2013-06-17 DIAGNOSIS — I2510 Atherosclerotic heart disease of native coronary artery without angina pectoris: Secondary | ICD-10-CM

## 2013-06-17 DIAGNOSIS — J9620 Acute and chronic respiratory failure, unspecified whether with hypoxia or hypercapnia: Secondary | ICD-10-CM | POA: Diagnosis present

## 2013-06-17 DIAGNOSIS — K219 Gastro-esophageal reflux disease without esophagitis: Secondary | ICD-10-CM | POA: Diagnosis present

## 2013-06-17 DIAGNOSIS — I4719 Other supraventricular tachycardia: Secondary | ICD-10-CM

## 2013-06-17 DIAGNOSIS — E782 Mixed hyperlipidemia: Secondary | ICD-10-CM | POA: Diagnosis present

## 2013-06-17 DIAGNOSIS — R7989 Other specified abnormal findings of blood chemistry: Secondary | ICD-10-CM

## 2013-06-17 DIAGNOSIS — J449 Chronic obstructive pulmonary disease, unspecified: Secondary | ICD-10-CM

## 2013-06-17 DIAGNOSIS — I509 Heart failure, unspecified: Secondary | ICD-10-CM | POA: Diagnosis present

## 2013-06-17 LAB — CBC WITH DIFFERENTIAL/PLATELET
BASOS ABS: 0 10*3/uL (ref 0.0–0.1)
Basophils Relative: 0 % (ref 0–1)
Eosinophils Absolute: 0 10*3/uL (ref 0.0–0.7)
Eosinophils Relative: 0 % (ref 0–5)
HCT: 33.7 % — ABNORMAL LOW (ref 39.0–52.0)
HEMOGLOBIN: 10.4 g/dL — AB (ref 13.0–17.0)
LYMPHS PCT: 7 % — AB (ref 12–46)
Lymphs Abs: 0.7 10*3/uL (ref 0.7–4.0)
MCH: 29.2 pg (ref 26.0–34.0)
MCHC: 30.9 g/dL (ref 30.0–36.0)
MCV: 94.7 fL (ref 78.0–100.0)
MONOS PCT: 7 % (ref 3–12)
Monocytes Absolute: 0.7 10*3/uL (ref 0.1–1.0)
NEUTROS ABS: 8.8 10*3/uL — AB (ref 1.7–7.7)
Neutrophils Relative %: 86 % — ABNORMAL HIGH (ref 43–77)
Platelets: 228 10*3/uL (ref 150–400)
RBC: 3.56 MIL/uL — ABNORMAL LOW (ref 4.22–5.81)
RDW: 17.5 % — ABNORMAL HIGH (ref 11.5–15.5)
WBC: 10.2 10*3/uL (ref 4.0–10.5)

## 2013-06-17 LAB — PROTIME-INR
INR: 2.29 — AB (ref 0.00–1.49)
Prothrombin Time: 24.5 seconds — ABNORMAL HIGH (ref 11.6–15.2)

## 2013-06-17 LAB — BASIC METABOLIC PANEL
BUN: 33 mg/dL — ABNORMAL HIGH (ref 6–23)
CHLORIDE: 95 meq/L — AB (ref 96–112)
CO2: 36 mEq/L — ABNORMAL HIGH (ref 19–32)
Calcium: 8.8 mg/dL (ref 8.4–10.5)
Creatinine, Ser: 1.29 mg/dL (ref 0.50–1.35)
GFR calc non Af Amer: 60 mL/min — ABNORMAL LOW (ref >90)
GFR, EST AFRICAN AMERICAN: 70 mL/min — AB (ref >90)
Glucose, Bld: 196 mg/dL — ABNORMAL HIGH (ref 70–99)
POTASSIUM: 4.4 meq/L (ref 3.7–5.3)
SODIUM: 140 meq/L (ref 137–147)

## 2013-06-17 LAB — TROPONIN I: Troponin I: 0.66 ng/mL (ref <0.30)

## 2013-06-17 MED ORDER — HEPARIN SODIUM (PORCINE) 5000 UNIT/ML IJ SOLN
4000.0000 [IU] | Freq: Once | INTRAMUSCULAR | Status: AC
  Administered 2013-06-17: 4000 [IU] via INTRAVENOUS
  Filled 2013-06-17: qty 1

## 2013-06-17 MED ORDER — ASPIRIN 325 MG PO TABS
325.0000 mg | ORAL_TABLET | Freq: Once | ORAL | Status: AC
  Administered 2013-06-17: 325 mg via ORAL
  Filled 2013-06-17: qty 1

## 2013-06-17 MED ORDER — HEPARIN (PORCINE) IN NACL 100-0.45 UNIT/ML-% IJ SOLN
950.0000 [IU]/h | INTRAMUSCULAR | Status: DC
  Administered 2013-06-17: 950 [IU]/h via INTRAVENOUS
  Filled 2013-06-17: qty 250

## 2013-06-17 MED ORDER — IPRATROPIUM-ALBUTEROL 0.5-2.5 (3) MG/3ML IN SOLN
3.0000 mL | Freq: Once | RESPIRATORY_TRACT | Status: AC
Start: 2013-06-17 — End: 2013-06-17
  Administered 2013-06-17: 3 mL via RESPIRATORY_TRACT
  Filled 2013-06-17: qty 3

## 2013-06-17 MED ORDER — FUROSEMIDE 10 MG/ML IJ SOLN
40.0000 mg | Freq: Once | INTRAMUSCULAR | Status: AC
  Administered 2013-06-17: 40 mg via INTRAVENOUS
  Filled 2013-06-17: qty 4

## 2013-06-17 MED ORDER — HEPARIN BOLUS VIA INFUSION: 4000.0000 [IU] | Freq: Once | INTRAVENOUS | Status: DC

## 2013-06-17 MED ORDER — HEPARIN (PORCINE) IN NACL 100-0.45 UNIT/ML-% IJ SOLN
12.0000 [IU]/kg/h | INTRAMUSCULAR | Status: DC
  Filled 2013-06-17: qty 250

## 2013-06-17 MED ORDER — METHYLPREDNISOLONE SODIUM SUCC 125 MG IJ SOLR
125.0000 mg | Freq: Once | INTRAMUSCULAR | Status: AC
  Administered 2013-06-17: 125 mg via INTRAVENOUS
  Filled 2013-06-17: qty 2

## 2013-06-17 NOTE — Progress Notes (Addendum)
ANTICOAGULATION CONSULT NOTE - Initial Consult  Pharmacy Consult for Heparin Indication: chest pain/ACS  Allergies  Allergen Reactions  . Lipitor [Atorvastatin] Other (See Comments)    Muscle spasms     Patient Measurements: Height: 5\' 10"  (177.8 cm) Weight: 178 lb (80.74 kg) IBW/kg (Calculated) : 73 Heparin Dosing Weight: 80.7 kg  Vital Signs: Temp: 98.4 F (36.9 C) (03/17 2045) Temp src: Oral (03/17 2045) BP: 135/86 mmHg (03/17 2113) Pulse Rate: 71 (03/17 2113)  Labs:  Recent Labs  06/17/13 2115  HGB 10.4*  HCT 33.7*  PLT 228  CREATININE 1.29  TROPONINI 0.66*    Estimated Creatinine Clearance: 66 ml/min (by C-G formula based on Cr of 1.29).   Medical History: Past Medical History  Diagnosis Date  . GERD (gastroesophageal reflux disease)   . COPD (chronic obstructive pulmonary disease)     On home oxygen  . Chronic combined systolic and diastolic CHF (congestive heart failure)   . Essential hypertension, benign   . Cardiomyopathy     03/2013 Echo: EF 50-55%, Gr 1 DD, sev dil RV/RA with sev RV dysfxn, PASP . No evidence of RV thrombus.  . Right ventricular mural thrombus     On Coumadin  . Cor pulmonale     a. Severe RV dysfunction;  b. 04/2013 R heart cath: RA 17, RV 70/18 (22), PA 73/52.  . Coronary atherosclerosis of native coronary artery     a. 05/2012 MI/PCI: DES to OM [facility];  b. 04/2013 Cath/PCI: LM nl, LAD nonobs, LCX patent mid stent, RCA dom, 54m (2.5x16 Promus DES).  Marland Kitchen PAF (paroxysmal atrial fibrillation)     On amiodarone  . Spontaneous pneumothorax 2002    Bilateral  . PAT (paroxysmal atrial tachycardia)   . Mixed hyperlipidemia   . History of pneumonia   . Type II diabetes mellitus   . CKD (chronic kidney disease) stage 2, GFR 60-89 ml/min     Medications:  Scheduled:  . aspirin  325 mg Oral Once  . furosemide  40 mg Intravenous Once  . heparin  4,000 Units Intravenous Once    Assessment: Heparin for  ACS/STEMI Labs reviewed, INR 2.29 on admission Patient on warfarin for AFIB, past INR therapeutic  Goal of Therapy:  Heparin level 0.3-0.7 units/ml Monitor platelets by anticoagulation protocol: Yes   Plan:  Give 4000 units bolus x 1 Start heparin infusion at 950 units/hr Check anti-Xa level in 6 hours and daily while on heparin Continue to monitor H&H and platelets  Raquel James, Caryle Helgeson Bennett 06/17/2013,10:27 PM

## 2013-06-17 NOTE — ED Notes (Signed)
MD notified of patient's blood pressure. 

## 2013-06-17 NOTE — ED Notes (Signed)
CRITICAL VALUE ALERT  Critical value received:  Troponin 0.66  Date of notification:  06/17/13  Time of notification:  22150  Critical value read back:yes  Nurse who received alert:  bkn  MD notified (1st page):  Adriana Simas  Time of first page: 2151  MD notified (2nd page):  Time of second page:  Responding MD:    Time MD responded:

## 2013-06-17 NOTE — ED Notes (Signed)
Patient complaining of shortness of breath. Patient on home O2.  O2 saturation 70% on 4L at triage. Patient alert and oriented at this time.

## 2013-06-17 NOTE — ED Provider Notes (Signed)
CSN: [id number]     Arrival date & time 06/17/13  2037 History   This chart was scribed for Donnetta Hutching, MD by Bennett Scrape, ED Scribe. This patient was seen in room APA14/APA14 and the patient's care was started at 8:47 PM.     Chief Complaint  Patient presents with  . Respiratory Distress   Level 5 Caveat-Need for Medical Intervention   The history is provided by the patient and the spouse. No language interpreter was used.    HPI Comments: Danny [name] is a 58 y.o. male who presents to the Emergency Department for severe SOB. Per wife, pt has a h/o chronic COPD and CHF and is on 4L continuously. Pt came from Dr. Delbert Harness for low O2 sats. O2 sat in the ED is 70% on 4L O2. Pt was admitted last week for the same and contracted PNA while in the hospital. Wife states that usually the pt is given a breathing treatment and started on steroids for the same sxs. He is also occasionally started on Lasix for the CHF but not frequently "due to his kidneys". Wife states that the pt is currently on a prednisone taper, 20 mg today and 10 mg tomorrow. Due to pt's current status, he is unable to answer any further questions.   Past Medical History  Diagnosis Date  . GERD (gastroesophageal reflux disease)   . COPD (chronic obstructive pulmonary disease)     On home oxygen  . Chronic combined systolic and diastolic CHF (congestive heart failure)   . Essential hypertension, benign   . Cardiomyopathy     03/2013 Echo: EF 50-55%, Gr 1 DD, sev dil RV/RA with sev RV dysfxn, PASP . No evidence of RV thrombus.  . Right ventricular mural thrombus     On Coumadin  . Cor pulmonale     a. Severe RV dysfunction;  b. 04/2013 R heart cath: RA 17, RV 70/18 (22), PA 73/52.  . Coronary atherosclerosis of native coronary artery     a. 05/2012 MI/PCI: DES to OM [facility];  b. 04/2013 Cath/PCI: LM nl, LAD nonobs, LCX patent mid stent, RCA dom, 16m (2.5x16 Promus DES).  Marland Kitchen PAF (paroxysmal atrial  fibrillation)     On amiodarone  . Spontaneous pneumothorax 2002    Bilateral  . PAT (paroxysmal atrial tachycardia)   . Mixed hyperlipidemia   . History of pneumonia   . Type II diabetes mellitus   . CKD (chronic kidney disease) stage 2, GFR 60-89 ml/min    Past Surgical History  Procedure Laterality Date  . 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 Coronary angioplasty with stent placement  04/2012; 04/2013    Lissa Hoard, [place] ("1"); "1; total of 2 now" (05/16/2013)  . Pilonidal cyst excision  ~ 1990   History reviewed. No pertinent family history. History  Substance Use Topics  . Smoking status: Former Smoker -- 2.00 packs/day for 25 years    Types: Cigarettes    Quit date: 04/03/1994  . Smokeless tobacco: Never Used  . Alcohol Use: Yes     Comment: 05/16/2013 "drank socialably; last drink 18 months or more ago"    Review of Systems  Unable to perform ROS: Severe respiratory distress      Allergies  Lipitor  Home Medications   Current Outpatient Rx  Name  Route  Sig  Dispense  Refill  . Aclidinium Bromide (TUDORZA PRESSAIR) 400 MCG/ACT AEPB   Inhalation  Inhale 1 puff into the lungs 2 (two) times daily.   1 each   3   . albuterol (PROVENTIL HFA;VENTOLIN HFA) 108 (90 BASE) MCG/ACT inhaler   Inhalation   Inhale 2 puffs into the lungs every 6 (six) hours as needed for wheezing or shortness of breath.          Marland Kitchen. albuterol (PROVENTIL) (2.5 MG/3ML) 0.083% nebulizer solution   Nebulization   Take 3 mLs (2.5 mg total) by nebulization every 2 (two) hours as needed for wheezing.   75 mL   12   . amiodarone (PACERONE) 200 MG tablet   Oral   Take 1 tablet (200 mg total) by mouth daily.   30 tablet   6   . arformoterol (BROVANA) 15 MCG/2ML NEBU   Nebulization   Take 15 mcg by nebulization 2 (two) times daily.         . budesonide (PULMICORT) 0.25 MG/2ML nebulizer solution   Nebulization   Take 0.25 mg by nebulization 2 (two) times daily.          . cloNIDine (CATAPRES) 0.1 MG tablet   Oral   Take 1 tablet (0.1 mg total) by mouth 2 (two) times daily.         . clotrimazole (MYCELEX) 10 MG troche   Oral   Take 10 mg by mouth daily.          . digoxin (LANOXIN) 0.125 MG tablet   Oral   Take 0.125 mg by mouth every morning.         Marland Kitchen. guaiFENesin-dextromethorphan (ROBITUSSIN DM) 100-10 MG/5ML syrup   Oral   Take 5 mLs by mouth every 4 (four) hours as needed for cough.   118 mL   0   . linagliptin (TRADJENTA) 5 MG TABS tablet   Oral   Take 5 mg by mouth daily.         Marland Kitchen. losartan (COZAAR) 100 MG tablet   Oral   Take 100 mg by mouth every morning.          . metoprolol (LOPRESSOR) 50 MG tablet   Oral   Take 1.5 tablets (75 mg total) by mouth 2 (two) times daily.   90 tablet   6   . omeprazole (PRILOSEC) 20 MG capsule   Oral   Take 20 mg by mouth 2 (two) times daily.          Marland Kitchen. PARoxetine (PAXIL) 20 MG tablet   Oral   Take 1 tablet (20 mg total) by mouth daily.   30 tablet   1   . prasugrel (EFFIENT) 10 MG TABS tablet   Oral   Take 1 tablet (10 mg total) by mouth daily.   30 tablet   6   . predniSONE (DELTASONE) 10 MG tablet   Oral   Take 20 mg by mouth daily. 20mg  a day for 2 more days, then resume your 10mg  a day.         . ramipril (ALTACE) 2.5 MG capsule   Oral   Take 2.5 mg by mouth daily.          . rosuvastatin (CRESTOR) 10 MG tablet   Oral   Take 10 mg by mouth every evening.         . traMADol (ULTRAM) 50 MG tablet   Oral   Take 50 mg by mouth every 6 (six) hours as needed. For pain         . warfarin (  COUMADIN) 2 MG tablet   Oral   Take 1 tablet (2 mg total) by mouth daily at 6 PM.   30 tablet   1   . nitroGLYCERIN (NITROSTAT) 0.4 MG SL tablet   Sublingual   Place 1 tablet (0.4 mg total) under the tongue every 5 (five) minutes x 3 doses as needed for chest pain.   25 tablet   3   . sodium chloride (OCEAN) 0.65 % nasal spray   Nasal   Place 1 spray into the  nose every 6 (six) hours as needed for congestion.          Triage Vitals: BP 113/70  Pulse 58  Temp(Src) 98.4 F (36.9 C) (Oral)  Resp 26  Ht 5\' 10"  (1.778 m)  Wt 178 lb (80.74 kg)  BMI 25.54 kg/m2  SpO2 91%  Physical Exam  Nursing note and vitals reviewed. Constitutional: He appears well-developed and well-nourished.  HENT:  Head: Normocephalic and atraumatic.  Eyes: Conjunctivae and EOM are normal. Pupils are equal, round, and reactive to light.  Neck: Normal range of motion. Neck supple.  Cardiovascular: Regular rhythm and normal heart sounds.  Bradycardia present.   Pulmonary/Chest: Breath sounds normal. He is in respiratory distress.  Poor air change   Abdominal: Soft. Bowel sounds are normal.  Musculoskeletal: Normal range of motion.  Neurological: He is alert.  Unable to assess   Skin: Skin is warm and dry.  Psychiatric:  Unable to assess    ED Course  Procedures (including critical care time)  Medications  furosemide (LASIX) injection 40 mg (not administered)  aspirin tablet 325 mg (not administered)  heparin injection 4,000 Units (not administered)  heparin ADULT infusion 100 units/mL (25000 units/250 mL) (not administered)  heparin bolus via infusion 4,000 Units (not administered)  heparin ADULT infusion 100 units/mL (25000 units/250 mL) (not administered)  ipratropium-albuterol (DUONEB) 0.5-2.5 (3) MG/3ML nebulizer solution 3 mL (3 mLs Nebulization Given 06/17/13 2057)  methylPREDNISolone sodium succinate (SOLU-MEDROL) 125 mg/2 mL injection 125 mg (125 mg Intravenous Given 06/17/13 2110)    DIAGNOSTIC STUDIES: Oxygen Saturation is 91% on NRB, adequate by my interpretation.    COORDINATION OF CARE: 9:00 PM-Discussed treatment plan which includes breathing tx, CXR, CBC panel, BMP and troponin with pt's wife at bedside and she agreed to plan. Advised that admission may be necessary and wife verbalized understanding.  Labs Review Labs Reviewed  BASIC  METABOLIC PANEL - Abnormal; Notable for the following:    Chloride 95 (*)    CO2 36 (*)    Glucose, Bld 196 (*)    BUN 33 (*)    GFR calc non Af Amer 60 (*)    GFR calc Af Amer 70 (*)    All other components within normal limits  CBC WITH DIFFERENTIAL - Abnormal; Notable for the following:    RBC 3.56 (*)    Hemoglobin 10.4 (*)    HCT 33.7 (*)    RDW 17.5 (*)    Neutrophils Relative % 86 (*)    Neutro Abs 8.8 (*)    Lymphocytes Relative 7 (*)    All other components within normal limits  TROPONIN I - Abnormal; Notable for the following:    Troponin I 0.66 (*)    All other components within normal limits  PROTIME-INR   Imaging Review Dg Chest Portable 1 View  06/17/2013   CLINICAL DATA:  Respiratory distress, COPD, CHF  EXAM: PORTABLE CHEST - 1 VIEW  COMPARISON:  DG CHEST  2 VIEW dated 06/10/2013; CT CHEST W/CM dated 05/27/2013; DG CHEST 2 VIEW dated 06/03/2013; CT ANGIO CHEST W/CM &/OR WO/CM dated 10/23/2012; DG CHEST 1V PORT dated 10/23/2012  FINDINGS: Partial left pneumonectomy. Bilateral emphysematous changes. 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 Bibasilar scarring again noted. Mild bilateral interstitial thickening likely chronic. No focal consolidation, pleural effusion or pneumothorax. Stable cardiomediastinal silhouette.  The osseous structures are unremarkable.  IMPRESSION: No active cardiopulmonary disease.   Electronically Signed   By: Elige Ko   On: 06/17/2013 21:47     EKG Interpretation None      Date: 06/17/2013  Rate: 72  Rhythm: normal sinus rhythm  QRS Axis: right  Intervals: normal  ST/T Wave abnormalities: ? ST dep ant-lat  Conduction Disutrbances: inc RBBB  Narrative Interpretation: unremarkable  CRITICAL CARE Performed by: Donnetta Hutching  ?  Total critical care time: 45  Critical care time was exclusive of separately billable procedures and treating other patients.  Critical care was necessary to treat or prevent imminent or life-threatening  deterioration.  Critical care was time spent personally by me on the following activities: development of treatment plan with patient and/or surrogate as well as nursing, discussions with consultants, evaluation of patient's response to treatment, examination of patient, obtaining history from patient or surrogate, ordering and performing treatments and interventions, ordering and review of laboratory studies, ordering and review of radiographic studies, pulse oximetry and re-evaluation of patient's condition.  MDM   Final diagnoses:  COPD exacerbation  Elevated troponin    Patient is very complex. He has serious heart and lung disease.   Troponin elevated at 0.66.     Albuterol/Atrovent nebulizer given.    Rx aspirin 324 mg by mouth, IV Lasix 40 mg, heparin bolus and drip. Will start BiPAP. Discussed with Dr. Okey Dupre at [facility]. Transfer     Donnetta Hutching, MD 06/17/13 2240

## 2013-06-18 ENCOUNTER — Encounter (HOSPITAL_COMMUNITY): Payer: Self-pay | Admitting: Internal Medicine

## 2013-06-18 ENCOUNTER — Encounter (HOSPITAL_COMMUNITY): Payer: Managed Care, Other (non HMO)

## 2013-06-18 DIAGNOSIS — J9690 Respiratory failure, unspecified, unspecified whether with hypoxia or hypercapnia: Secondary | ICD-10-CM | POA: Diagnosis present

## 2013-06-18 DIAGNOSIS — I2789 Other specified pulmonary heart diseases: Secondary | ICD-10-CM

## 2013-06-18 DIAGNOSIS — I2510 Atherosclerotic heart disease of native coronary artery without angina pectoris: Secondary | ICD-10-CM

## 2013-06-18 DIAGNOSIS — J441 Chronic obstructive pulmonary disease with (acute) exacerbation: Secondary | ICD-10-CM

## 2013-06-18 DIAGNOSIS — J9610 Chronic respiratory failure, unspecified whether with hypoxia or hypercapnia: Secondary | ICD-10-CM

## 2013-06-18 DIAGNOSIS — I214 Non-ST elevation (NSTEMI) myocardial infarction: Secondary | ICD-10-CM

## 2013-06-18 DIAGNOSIS — I471 Supraventricular tachycardia, unspecified: Secondary | ICD-10-CM

## 2013-06-18 DIAGNOSIS — Z7901 Long term (current) use of anticoagulants: Secondary | ICD-10-CM

## 2013-06-18 DIAGNOSIS — I5042 Chronic combined systolic (congestive) and diastolic (congestive) heart failure: Secondary | ICD-10-CM

## 2013-06-18 DIAGNOSIS — I4891 Unspecified atrial fibrillation: Secondary | ICD-10-CM

## 2013-06-18 DIAGNOSIS — I429 Cardiomyopathy, unspecified: Secondary | ICD-10-CM

## 2013-06-18 DIAGNOSIS — J9600 Acute respiratory failure, unspecified whether with hypoxia or hypercapnia: Secondary | ICD-10-CM

## 2013-06-18 DIAGNOSIS — I5033 Acute on chronic diastolic (congestive) heart failure: Secondary | ICD-10-CM

## 2013-06-18 DIAGNOSIS — I10 Essential (primary) hypertension: Secondary | ICD-10-CM

## 2013-06-18 DIAGNOSIS — I50810 Right heart failure, unspecified: Secondary | ICD-10-CM | POA: Diagnosis present

## 2013-06-18 DIAGNOSIS — N289 Disorder of kidney and ureter, unspecified: Secondary | ICD-10-CM

## 2013-06-18 DIAGNOSIS — I509 Heart failure, unspecified: Secondary | ICD-10-CM

## 2013-06-18 LAB — GLUCOSE, CAPILLARY
GLUCOSE-CAPILLARY: 131 mg/dL — AB (ref 70–99)
Glucose-Capillary: 183 mg/dL — ABNORMAL HIGH (ref 70–99)
Glucose-Capillary: 170 mg/dL — ABNORMAL HIGH (ref 70–99)
Glucose-Capillary: 174 mg/dL — ABNORMAL HIGH (ref 70–99)
Glucose-Capillary: 138 mg/dL — ABNORMAL HIGH (ref 70–99)
Glucose-Capillary: 216 mg/dL — ABNORMAL HIGH (ref 70–99)

## 2013-06-18 LAB — COMPREHENSIVE METABOLIC PANEL
ALBUMIN: 3.1 g/dL — AB (ref 3.5–5.2)
ALT: 30 U/L (ref 0–53)
AST: 19 U/L (ref 0–37)
Alkaline Phosphatase: 67 U/L (ref 39–117)
BUN: 32 mg/dL — ABNORMAL HIGH (ref 6–23)
CALCIUM: 9.1 mg/dL (ref 8.4–10.5)
CO2: 33 meq/L — AB (ref 19–32)
Chloride: 94 mEq/L — ABNORMAL LOW (ref 96–112)
Creatinine, Ser: 1.21 mg/dL (ref 0.50–1.35)
GFR calc Af Amer: 76 mL/min — ABNORMAL LOW (ref >90)
GFR, EST NON AFRICAN AMERICAN: 65 mL/min — AB (ref >90)
Glucose, Bld: 164 mg/dL — ABNORMAL HIGH (ref 70–99)
Potassium: 4.6 mEq/L (ref 3.7–5.3)
SODIUM: 142 meq/L (ref 137–147)
Total Bilirubin: 0.8 mg/dL (ref 0.3–1.2)
Total Protein: 6 g/dL (ref 6.0–8.3)

## 2013-06-18 LAB — PROTIME-INR
INR: 2 — ABNORMAL HIGH (ref 0.00–1.49)
Prothrombin Time: 22.1 seconds — ABNORMAL HIGH (ref 11.6–15.2)

## 2013-06-18 LAB — CBC WITH DIFFERENTIAL/PLATELET
BASOS PCT: 0 % (ref 0–1)
Basophils Absolute: 0 10*3/uL (ref 0.0–0.1)
Eosinophils Absolute: 0 10*3/uL (ref 0.0–0.7)
Eosinophils Relative: 0 % (ref 0–5)
HCT: 35.3 % — ABNORMAL LOW (ref 39.0–52.0)
Hemoglobin: 10.9 g/dL — ABNORMAL LOW (ref 13.0–17.0)
Lymphocytes Relative: 6 % — ABNORMAL LOW (ref 12–46)
Lymphs Abs: 0.6 10*3/uL — ABNORMAL LOW (ref 0.7–4.0)
MCH: 28.7 pg (ref 26.0–34.0)
MCHC: 30.9 g/dL (ref 30.0–36.0)
MCV: 92.9 fL (ref 78.0–100.0)
MONO ABS: 0.1 10*3/uL (ref 0.1–1.0)
Monocytes Relative: 1 % — ABNORMAL LOW (ref 3–12)
Neutro Abs: 9.7 10*3/uL — ABNORMAL HIGH (ref 1.7–7.7)
Neutrophils Relative %: 94 % — ABNORMAL HIGH (ref 43–77)
Platelets: 251 10*3/uL (ref 150–400)
RBC: 3.8 MIL/uL — ABNORMAL LOW (ref 4.22–5.81)
RDW: 17.8 % — ABNORMAL HIGH (ref 11.5–15.5)
WBC: 10.3 10*3/uL (ref 4.0–10.5)

## 2013-06-18 LAB — BLOOD GAS, VENOUS
Acid-Base Excess: 11.7 mmol/L — ABNORMAL HIGH (ref 0.0–2.0)
Bicarbonate: 36.6 mEq/L — ABNORMAL HIGH (ref 20.0–24.0)
O2 SAT: 84.4 %
Patient temperature: 98.6
TCO2: 38.3 mmol/L (ref 0–100)
pCO2, Ven: 55.3 mmHg — ABNORMAL HIGH (ref 45.0–50.0)
pH, Ven: 7.436 — ABNORMAL HIGH (ref 7.250–7.300)
pO2, Ven: 54.3 mmHg — ABNORMAL HIGH (ref 30.0–45.0)

## 2013-06-18 LAB — TROPONIN I: TROPONIN I: 0.69 ng/mL — AB (ref <0.30)

## 2013-06-18 LAB — MAGNESIUM: Magnesium: 2.1 mg/dL (ref 1.5–2.5)

## 2013-06-18 LAB — MRSA PCR SCREENING: MRSA BY PCR: NEGATIVE

## 2013-06-18 LAB — DIGOXIN LEVEL: Digoxin Level: 1.2 ng/mL (ref 0.8–2.0)

## 2013-06-18 LAB — LACTIC ACID, PLASMA: Lactic Acid, Venous: 1.9 mmol/L (ref 0.5–2.2)

## 2013-06-18 LAB — APTT: APTT: 71 s — AB (ref 24–37)

## 2013-06-18 MED ORDER — PAROXETINE HCL 20 MG PO TABS
20.0000 mg | ORAL_TABLET | Freq: Every day | ORAL | Status: DC
  Administered 2013-06-18 – 2013-06-23 (×6): 20 mg via ORAL
  Filled 2013-06-18 (×6): qty 1

## 2013-06-18 MED ORDER — PREDNISONE 10 MG PO TABS
10.0000 mg | ORAL_TABLET | Freq: Every day | ORAL | Status: DC
  Administered 2013-06-18 – 2013-06-23 (×6): 10 mg via ORAL
  Filled 2013-06-18 (×6): qty 1

## 2013-06-18 MED ORDER — SODIUM CHLORIDE 0.9 % IJ SOLN
3.0000 mL | Freq: Two times a day (BID) | INTRAMUSCULAR | Status: DC
  Administered 2013-06-18 – 2013-06-22 (×8): 3 mL via INTRAVENOUS

## 2013-06-18 MED ORDER — FUROSEMIDE 10 MG/ML IJ SOLN
40.0000 mg | Freq: Every day | INTRAMUSCULAR | Status: DC
  Administered 2013-06-18 – 2013-06-19 (×2): 40 mg via INTRAVENOUS
  Filled 2013-06-18 (×3): qty 4

## 2013-06-18 MED ORDER — NON FORMULARY: 10.0000 mg | Freq: Every day | Status: DC

## 2013-06-18 MED ORDER — ARFORMOTEROL TARTRATE 15 MCG/2ML IN NEBU
15.0000 ug | INHALATION_SOLUTION | Freq: Two times a day (BID) | RESPIRATORY_TRACT | Status: DC
  Administered 2013-06-18 – 2013-06-23 (×10): 15 ug via RESPIRATORY_TRACT
  Filled 2013-06-18 (×14): qty 2

## 2013-06-18 MED ORDER — CLONIDINE HCL 0.1 MG PO TABS
0.1000 mg | ORAL_TABLET | Freq: Two times a day (BID) | ORAL | Status: DC
  Administered 2013-06-18 – 2013-06-23 (×11): 0.1 mg via ORAL
  Filled 2013-06-18 (×13): qty 1

## 2013-06-18 MED ORDER — WARFARIN - PHYSICIAN DOSING INPATIENT
Freq: Every day | Status: DC
  Administered 2013-06-18: 17:00:00

## 2013-06-18 MED ORDER — TIOTROPIUM BROMIDE MONOHYDRATE 18 MCG IN CAPS
18.0000 ug | ORAL_CAPSULE | Freq: Every day | RESPIRATORY_TRACT | Status: DC
  Administered 2013-06-19: 18 ug via RESPIRATORY_TRACT
  Filled 2013-06-18: qty 5

## 2013-06-18 MED ORDER — PANTOPRAZOLE SODIUM 40 MG PO TBEC
40.0000 mg | DELAYED_RELEASE_TABLET | Freq: Every day | ORAL | Status: DC
  Administered 2013-06-18 – 2013-06-23 (×6): 40 mg via ORAL
  Filled 2013-06-18 (×5): qty 1

## 2013-06-18 MED ORDER — NITROGLYCERIN 0.4 MG SL SUBL: 0.4000 mg | SUBLINGUAL_TABLET | SUBLINGUAL | Status: DC | PRN

## 2013-06-18 MED ORDER — LOSARTAN POTASSIUM 50 MG PO TABS
100.0000 mg | ORAL_TABLET | Freq: Every day | ORAL | Status: DC
  Administered 2013-06-18 – 2013-06-23 (×6): 100 mg via ORAL
  Filled 2013-06-18 (×6): qty 2

## 2013-06-18 MED ORDER — PRASUGREL HCL 10 MG PO TABS
10.0000 mg | ORAL_TABLET | Freq: Every day | ORAL | Status: DC
  Administered 2013-06-18 – 2013-06-22 (×4): 10 mg via ORAL
  Filled 2013-06-18 (×6): qty 1

## 2013-06-18 MED ORDER — ALBUTEROL SULFATE (2.5 MG/3ML) 0.083% IN NEBU
2.5000 mg | INHALATION_SOLUTION | RESPIRATORY_TRACT | Status: DC | PRN
  Administered 2013-06-19 – 2013-06-23 (×5): 2.5 mg via RESPIRATORY_TRACT
  Filled 2013-06-18 (×7): qty 3

## 2013-06-18 MED ORDER — SODIUM CHLORIDE 0.9 % IJ SOLN
3.0000 mL | INTRAMUSCULAR | Status: DC | PRN
  Administered 2013-06-19: 3 mL via INTRAVENOUS

## 2013-06-18 MED ORDER — METOPROLOL TARTRATE 50 MG PO TABS
50.0000 mg | ORAL_TABLET | Freq: Two times a day (BID) | ORAL | Status: DC
  Administered 2013-06-18 – 2013-06-22 (×9): 50 mg via ORAL
  Filled 2013-06-18 (×11): qty 1

## 2013-06-18 MED ORDER — BUDESONIDE 0.25 MG/2ML IN SUSP
0.2500 mg | Freq: Two times a day (BID) | RESPIRATORY_TRACT | Status: DC
  Administered 2013-06-18 – 2013-06-23 (×11): 0.25 mg via RESPIRATORY_TRACT
  Filled 2013-06-18 (×13): qty 2

## 2013-06-18 MED ORDER — TRAMADOL HCL 50 MG PO TABS
50.0000 mg | ORAL_TABLET | Freq: Four times a day (QID) | ORAL | Status: DC | PRN
  Administered 2013-06-18 – 2013-06-23 (×5): 50 mg via ORAL
  Filled 2013-06-18 (×5): qty 1

## 2013-06-18 MED ORDER — ASPIRIN EC 81 MG PO TBEC
81.0000 mg | DELAYED_RELEASE_TABLET | Freq: Every day | ORAL | Status: DC
Start: 2013-06-18 — End: 2013-06-22
  Administered 2013-06-18 – 2013-06-22 (×5): 81 mg via ORAL
  Filled 2013-06-18 (×5): qty 1

## 2013-06-18 MED ORDER — AMIODARONE HCL 200 MG PO TABS
200.0000 mg | ORAL_TABLET | Freq: Every day | ORAL | Status: DC
  Administered 2013-06-18 – 2013-06-23 (×6): 200 mg via ORAL
  Filled 2013-06-18 (×6): qty 1

## 2013-06-18 MED ORDER — INSULIN ASPART 100 UNIT/ML ~~LOC~~ SOLN
0.0000 [IU] | Freq: Three times a day (TID) | SUBCUTANEOUS | Status: DC
  Administered 2013-06-18 (×2): 3 [IU] via SUBCUTANEOUS
  Administered 2013-06-18: 2 [IU] via SUBCUTANEOUS
  Administered 2013-06-19: 3 [IU] via SUBCUTANEOUS
  Administered 2013-06-19: 2 [IU] via SUBCUTANEOUS
  Administered 2013-06-21: 5 [IU] via SUBCUTANEOUS
  Administered 2013-06-21 (×2): 2 [IU] via SUBCUTANEOUS
  Administered 2013-06-22 (×2): 3 [IU] via SUBCUTANEOUS
  Administered 2013-06-22: 2 [IU] via SUBCUTANEOUS
  Administered 2013-06-23: 3 [IU] via SUBCUTANEOUS
  Administered 2013-06-23: 5 [IU] via SUBCUTANEOUS

## 2013-06-18 MED ORDER — ROSUVASTATIN CALCIUM 10 MG PO TABS
10.0000 mg | ORAL_TABLET | Freq: Every day | ORAL | Status: DC
  Administered 2013-06-18 – 2013-06-22 (×5): 10 mg via ORAL
  Filled 2013-06-18 (×5): qty 1

## 2013-06-18 MED ORDER — DIGOXIN 125 MCG PO TABS
0.1250 mg | ORAL_TABLET | Freq: Every morning | ORAL | Status: DC
  Administered 2013-06-18 – 2013-06-23 (×6): 0.125 mg via ORAL
  Filled 2013-06-18 (×6): qty 1

## 2013-06-18 MED ORDER — CLOTRIMAZOLE 10 MG MT TROC
10.0000 mg | Freq: Every day | OROMUCOSAL | Status: DC
  Administered 2013-06-18 – 2013-06-23 (×6): 10 mg via ORAL
  Filled 2013-06-18 (×8): qty 1

## 2013-06-18 MED ORDER — SODIUM CHLORIDE 0.9 % IV SOLN
250.0000 mL | INTRAVENOUS | Status: DC | PRN
  Administered 2013-06-21: 250 mL via INTRAVENOUS

## 2013-06-18 MED ORDER — WHITE PETROLATUM GEL
Status: AC
  Administered 2013-06-18: 0.2
  Filled 2013-06-18: qty 5

## 2013-06-18 MED ORDER — WARFARIN SODIUM 2 MG PO TABS
2.0000 mg | ORAL_TABLET | Freq: Every day | ORAL | Status: DC
  Filled 2013-06-18: qty 1

## 2013-06-18 NOTE — Progress Notes (Signed)
Utilization review completed.  

## 2013-06-18 NOTE — Progress Notes (Signed)
Subjective: Denies further chest pain but still SOB.   Objective: Vital signs in last 24 hours: Temp:  [96.5 F (35.8 C)-98.4 F (36.9 C)] 97.3 F (36.3 C) (03/18 1128) Pulse Rate:  [58-87] 87 (03/18 1128) Resp:  [11-26] 11 (03/18 1128) BP: (113-152)/(70-104) 123/87 mmHg (03/18 1128) SpO2:  [70 %-98 %] 90 % (03/18 1128) FiO2 (%):  [40 %-55 %] 40 % (03/18 0924) Weight:  [156 lb (70.761 kg)-178 lb (80.74 kg)] 156 lb (70.761 kg) (03/18 0500)    Intake/Output from previous day: 03/17 0701 - 03/18 0700 In: 21.9 [I.V.:21.9] Out: 1200 [Urine:1200] Intake/Output this shift: Total I/O In: -  Out: 300 [Urine:300]  Medications Current Facility-Administered Medications  Medication Dose Route Frequency Provider Last Rate Last Dose  . 0.9 %  sodium chloride infusion  250 mL Intravenous PRN Christopher A. End, MD      . albuterol (PROVENTIL) (2.5 MG/3ML) 0.083% nebulizer solution 2.5 mg  2.5 mg Nebulization Q2H PRN Si Raider End, MD      . amiodarone (PACERONE) tablet 200 mg  200 mg Oral Daily Christopher A. End, MD   200 mg at 06/18/13 1050  . arformoterol (BROVANA) nebulizer solution 15 mcg  15 mcg Nebulization BID Arna Medici, MD   15 mcg at 06/18/13 0845  . aspirin EC tablet 81 mg  81 mg Oral Daily Si Raider End, MD   81 mg at 06/18/13 1050  . budesonide (PULMICORT) nebulizer solution 0.25 mg  0.25 mg Nebulization BID Si Raider End, MD   0.25 mg at 06/18/13 0845  . cloNIDine (CATAPRES) tablet 0.1 mg  0.1 mg Oral BID Si Raider End, MD   0.1 mg at 06/18/13 1050  . clotrimazole (MYCELEX) troche 10 mg  10 mg Oral Daily Si Raider End, MD   10 mg at 06/18/13 1056  . digoxin (LANOXIN) tablet 0.125 mg  0.125 mg Oral q morning - 10a Christopher A. End, MD   0.125 mg at 06/18/13 1050  . furosemide (LASIX) injection 40 mg  40 mg Intravenous Daily Si Raider End, MD   40 mg at 06/18/13 1051  . insulin aspart (novoLOG) injection 0-15 Units  0-15 Units  Subcutaneous TID WC Arna Medici, MD   3 Units at 06/18/13 1348  . losartan (COZAAR) tablet 100 mg  100 mg Oral Daily Si Raider End, MD   100 mg at 06/18/13 1050  . metoprolol (LOPRESSOR) tablet 50 mg  50 mg Oral BID Si Raider End, MD   50 mg at 06/18/13 1050  . nitroGLYCERIN (NITROSTAT) SL tablet 0.4 mg  0.4 mg Sublingual Q5 Min x 3 PRN Si Raider End, MD      . pantoprazole (PROTONIX) EC tablet 40 mg  40 mg Oral Daily Si Raider End, MD   40 mg at 06/18/13 1051  . PARoxetine (PAXIL) tablet 20 mg  20 mg Oral Daily Si Raider End, MD   20 mg at 06/18/13 1050  . prasugrel (EFFIENT) tablet 10 mg  10 mg Oral Daily Si Raider End, MD   10 mg at 06/18/13 1050  . predniSONE (DELTASONE) tablet 10 mg  10 mg Oral Daily Si Raider End, MD   10 mg at 06/18/13 1050  . rosuvastatin (CRESTOR) tablet 10 mg  10 mg Oral Daily Si Raider End, MD   10 mg at 06/18/13 1050  . sodium chloride 0.9 % injection 3 mL  3 mL Intravenous Q12H Arna Medici, MD   3  mL at 06/18/13 1101  . sodium chloride 0.9 % injection 3 mL  3 mL Intravenous PRN Si Raiderhristopher A. End, MD      . tiotropium (SPIRIVA) inhalation capsule 18 mcg  18 mcg Inhalation Daily Si Raiderhristopher A. End, MD      . traMADol (ULTRAM) tablet 50 mg  50 mg Oral Q6H PRN Si Raiderhristopher A. End, MD      . warfarin (COUMADIN) tablet 2 mg  2 mg Oral q1800 Arna Medicihristopher A. End, MD      . Warfarin - Physician Dosing Inpatient   Does not apply q1800 Colleen CanVeronda Pauline Bryk, [facility]        PE: General appearance: alert, cooperative and no distress Neck: mild JVD Lungs: faint bibasilar rales Abdomen: soft, non-tender; bowel sounds normal; no masses,  no organomegaly Extremities: 1-2+ bilateral LEE Pulses: 2+ and symmetric Skin: warm and dry Neurologic: Grossly normal  Lab Results:   Recent Labs  06/17/13 2115 06/18/13 0300  WBC 10.2 10.3  HGB 10.4* 10.9*  HCT 33.7* 35.3*  PLT 228 251   BMET  Recent Labs  06/17/13 2115  06/18/13 0300  NA 140 142  K 4.4 4.6  CL 95* 94*  CO2 36* 33*  GLUCOSE 196* 164*  BUN 33* 32*  CREATININE 1.29 1.21  CALCIUM 8.8 9.1   PT/INR  Recent Labs  06/17/13 2227 06/18/13 0500  LABPROT 24.5* 22.1*  INR 2.29* 2.00*   Cardiac Panel (last 3 results)  Recent Labs  06/17/13 2115 06/18/13 0300  TROPONINI 0.66* 0.69*     Assessment/Plan  Principal Problem:   Right heart failure Active Problems:   COPD exacerbation   Respiratory failure   NSTEMI (non-ST elevated myocardial infarction)  Plan: He denies further chest pain but he continues to have SOB. He was restarted on Bipap as he continued to desat into the 80s on 5L [place]. O2 stats are currently in the low 90s. He continues to have faint bibasilar rales and significant bilateral LEE on physical exam. Continue on IV Lasix, 40 mg BID. SCr is stable at 1.21. BP also stable. He is maintaining NSR on telemetry. HR in the 70s. His troponins have been mildly positive x 2 at 0.66 and 0.69. He recently underwent PCI to the RCA in January. He has been on Effient + warfarin (for PAF). Not on ASA. He reports medication compliance. Although elevated enzymes may potentially be subsequent to CHF, ? If relook cath should be pursued in light of recent PCI. He is not on IV heparin currently as last INR >2. ? If we should recheck INR this PM and place on IV heparin once INR <2.0. Will make NPO at midnight. I have scheduled him for a LHC with Dr. Clifton JamesMcAlhany tomorrow at 1:30. However, the patient will be assessed by Dr. Herbie BaltimoreHarding later this PM. If he feels differently, then cath can be canceled.     LOS: 1 day    Brittainy M. Sharol HarnessSimmons, PA-C 06/18/2013 4:11 PM  I seen and evaluated the patient this evening along with bradycardia Sharol HarnessSimmons, PA-C. I do agree with her findings, impression and recommendations. I would also add that (2 left heart catheterization with his pulmonary hypertension concerns that it would benefit us to do a right heart  catheterization as well in order to assess wedge pressures and underlying pulmonary pressures as well.  Provided there is no coronary lesion to explain his positive troponins, and may simply be from right heart failure. In which case, we would need to  consider possibility of using milrinone as an inodilator to assist with diuresis.  I do agree with continuing IV Lasix for now. He is notably improved from symptoms standpoint. He is not having anymore chest discomfort currently.  My concern is that he has baseline underlying pulmonary hypertension and developed potentially worsening diastolic or systolic heart failure on the left side the pulmonary venous congestion increasing the already elevated pulmonary arterial pressures. Echocardiographically he has a severely dilated right ventricle with severely reduced function. On exam he has pulsatile liver with pulsatile jugular veins are very distended. Right heart pressures with a wedge and LVEDP pressures will help [place] know the extent of diuresis required. Depending on the results of the study it would be beneficial to enlist the assistance of the advanced heart failure service if milrinone or dobutamine would potentially be beneficial.  We are holding his warfarin, with the hopes that by an afternoon case his INR will be down enough in order to perform potentially radial arterial catheter left heart catheterization with either brachial, IJ or femoral vein access for right heart catheterization.  Marykay Lex, M.D., M.S. Interventional Cardiologist  [facility] Pager # [phone] 06/18/2013

## 2013-06-18 NOTE — H&P (Addendum)
History and Physical  Patient ID: Danny [name] MRN: [id number], DOB: 03/10/1956 Date of Encounter: 06/18/2013, 2:15 AM Primary Physician: Isabella Stalling, MD Primary Cardiologist: Nona Dell, MD  Chief Complaint: Shortness of breath Reason for Admission: NSTEMI and respiratory failure  HPI: 58 year-old man with history of coronary artery disease status post PCI to the RCA in 04/2013, severe pulmonary hypertension and right heart failure, severe steroid and oxygen dependent COPD, paroxysmal atrial fibrillation, prior RV thrombus on chronic anticoagulation, hypertension, and diabetes mellitus, who has been transferred to [facility] for further management of shortness of breath and elevated troponin.  Danny [name] was recently hospitalized from 05/26/13 through 06/11/13 at [facility] for respiratory failure thought to be due to COPD exacerbation, decompensated right heart failure, and subsequent hospital-acquired pneumonia.  During this hospitalization, the patient was initially gently diuresed, though his diuretics were ultimately held.  Following discharge from the hospital, the patient had notable lethargy and poor appetite.  He also had worsening hypoxia.  He chronically uses 4-5 L of oxygen via nasal cannula to maintain his sats around 88%.  However, with standing, his sats would quickly drop into the 70s despite up titration of his supplemental oxygen.  After speaking with his primary care provider on multiple occasions, the patient was ultimately advised to go to the emergency department yesterday afternoon for further evaluation.  The patient also endorses chest tightness for which he has been taking acetaminophen and tramadol over the course of the last week.  He notes that this tightness has been present for several months and may have improved somewhat following PCI to the RCA in January.  However, the pain never completely resolved.  He has significant orthopnea, sleeping at approximately  60 in a hospital bed.  He has also noted worsening bilateral leg edema, for which he has been using compression hose since leaving the hospital, though he has remained off all diuretics.  He denies fever and chills.  He has remained compliant with his home medications, including warfarin and Effient.  While in the hospital earlier this month, he had some epistaxis that has since resolved.  He has had no further bleeding.  At [facility] yesterday, the patient developed worsening hypoxia and ultimately required 50% facemask to maintain oxygen saturation above 90%.  While there, he was also noted to have an elevated troponin of 0.66.  He was therefore given full dose aspirin and started on a heparin infusion with bolus.  The patient was also given Lasix 40 mg IV x1, as well as methylprednisolone 125 mg x1.  He now reports that his breathing and chest tightness have improved; he currently rates his chest discomfort as 0/10.  Past Medical History  Diagnosis Date  . GERD (gastroesophageal reflux disease)   . COPD (chronic obstructive pulmonary disease)     On home oxygen  . Chronic combined systolic and diastolic CHF (congestive heart failure)   . Essential hypertension, benign   . Cardiomyopathy     03/2013 Echo: EF 50-55%, Gr 1 DD, sev dil RV/RA with sev RV dysfxn, PASP . No evidence of RV thrombus.  . Right ventricular mural thrombus     On Coumadin  . Cor pulmonale     a. Severe RV dysfunction;  b. 04/2013 R heart cath: RA 17, RV 70/18 (22), PA 73/52.  . Coronary atherosclerosis of native coronary artery     a. 05/2012 MI/PCI: DES to OM [facility];  b. 04/2013 Cath/PCI: Marcello Fennel  nl, LAD nonobs, LCX patent mid stent, RCA dom, 591m (2.5x16 Promus DES).  Marland Kitchen. PAF (paroxysmal atrial fibrillation)     On amiodarone  . Spontaneous pneumothorax 2002    Bilateral  . PAT (paroxysmal atrial tachycardia)   . Mixed hyperlipidemia   . History of pneumonia   . Type II diabetes mellitus   . CKD  (chronic kidney disease) stage 2, GFR 60-89 ml/min      Most Recent Cardiac Studies: Right heart catheterization (04/23/13): Mean RA pressure 17, RV pressure 70/18, PA 73/52 , PA sat 54% , cardiac output 5.8 L/min, cardiac index 3.1 L/min.  Left heart catheterization (04/19/13): LVEDP 20, focal 80% hazy stenosis in the mid RCA status post 2.5 x 16 mm drug-eluting stent (post-dilated to 3 mm).  Widely patent LMCA, LAD, and LCx, including stent in the mid LCx.  Echocardiogram (03/31/13): Mild LVH with LVEF of 50-55% .  Grade 1 diastolic dysfunction present.  RV severely dilated with severely reduced function.  Right atrium is moderately to severely dilated.  Mild TR with moderate pulmonary hypertension.     Surgical History:  Past Surgical History  Procedure Laterality Date  . 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 Coronary angioplasty with stent placement  04/2012; 04[place] "1; total of 2 now" (05/16/2013)  . Pilonidal cyst excision  ~ 1990     Home Meds: Prior to Admission medications   Medication Sig Start Date Danny [name] Date Taking? Authorizing Provider  Aclidinium Bromide (TUDORZA PRESSAIR) 400 MCG/ACT AEPB Inhale 1 puff into the lungs 2 (two) times daily. 06/11/13  Yes Isabella Stallingichard M Dondiego, MD  albuterol (PROVENTIL HFA;VENTOLIN HFA) 108 (90 BASE) MCG/ACT inhaler Inhale 2 puffs into the lungs every 6 (six) hours as needed for wheezing or shortness of breath.    Yes Historical Provider, MD  albuterol (PROVENTIL) (2.5 MG/3ML) 0.083% nebulizer solution Take 3 mLs (2.5 mg total) by nebulization every 2 (two) hours as needed for wheezing. 06/11/13  Yes Isabella Stallingichard M Dondiego, MD  amiodarone (PACERONE) 200 MG tablet Take 1 tablet (200 mg total) by mouth daily. 04/27/13  Yes Ok Anishristopher R Berge, NP  arformoterol (BROVANA) 15 MCG/2ML NEBU Take 15 mcg by nebulization 2 (two) times daily.   Yes Historical Provider, MD  budesonide (PULMICORT) 0.25 MG/2ML nebulizer solution  Take 0.25 mg by nebulization 2 (two) times daily.   Yes Historical Provider, MD  cloNIDine (CATAPRES) 0.1 MG tablet Take 1 tablet (0.1 mg total) by mouth 2 (two) times daily. 05/18/13  Yes Vassie Lollarlos Madera, MD  clotrimazole (MYCELEX) 10 MG troche Take 10 mg by mouth daily.  02/25/13  Yes Historical Provider, MD  digoxin (LANOXIN) 0.125 MG tablet Take 0.125 mg by mouth every morning.   Yes Historical Provider, MD  guaiFENesin-dextromethorphan (ROBITUSSIN DM) 100-10 MG/5ML syrup Take 5 mLs by mouth every 4 (four) hours as needed for cough. 06/03/13  Yes Isabella Stallingichard M Dondiego, MD  linagliptin (TRADJENTA) 5 MG TABS tablet Take 5 mg by mouth daily.   Yes Historical Provider, MD  losartan (COZAAR) 100 MG tablet Take 100 mg by mouth every morning.  02/03/13  Yes Historical Provider, MD  metoprolol (LOPRESSOR) 50 MG tablet Take 1.5 tablets (75 mg total) by mouth 2 (two) times daily. 04/24/13  Yes Ok Anishristopher R Berge, NP  omeprazole (PRILOSEC) 20 MG capsule Take 20 mg by mouth 2 (two) times daily.    Yes Historical Provider, MD  PARoxetine (PAXIL) 20 MG tablet Take  1 tablet (20 mg total) by mouth daily. 06/11/13  Yes Isabella Stalling, MD  prasugrel (EFFIENT) 10 MG TABS tablet Take 1 tablet (10 mg total) by mouth daily. 04/24/13  Yes Ok Anis, NP  predniSONE (DELTASONE) 10 MG tablet Take 20 mg by mouth daily. 20mg  a day for 2 more days, then resume your 10mg  a day. 05/18/13  Yes Vassie Loll, MD  ramipril (ALTACE) 2.5 MG capsule Take 2.5 mg by mouth daily.  04/11/13  Yes Historical Provider, MD  rosuvastatin (CRESTOR) 10 MG tablet Take 10 mg by mouth every evening.   Yes Historical Provider, MD  traMADol (ULTRAM) 50 MG tablet Take 50 mg by mouth every 6 (six) hours as needed. For pain 06/14/13  Yes Historical Provider, MD  warfarin (COUMADIN) 2 MG tablet Take 1 tablet (2 mg total) by mouth daily at 6 PM. 06/11/13  Yes Isabella Stalling, MD  nitroGLYCERIN (NITROSTAT) 0.4 MG SL tablet Place 1 tablet (0.4 mg total)  under the tongue every 5 (five) minutes x 3 doses as needed for chest pain. 04/24/13   Ok Anis, NP  sodium chloride (OCEAN) 0.65 % nasal spray Place 1 spray into the nose every 6 (six) hours as needed for congestion.    Historical Provider, MD    Allergies:  Allergies  Allergen Reactions  . Lipitor [Atorvastatin] Other (See Comments)    Muscle spasms     History   Social History  . Marital Status: Married    Spouse Name: N/A    Number of Children: N/A  . Years of Education: N/A   Occupational History  . Tour bus driver    Social History Main Topics  . Smoking status: Former Smoker -- 2.00 packs/day for 25 years    Types: Cigarettes    Quit date: 04/03/1994  . Smokeless tobacco: Never Used  . Alcohol Use: Yes     Comment: 05/16/2013 "drank socialably; last drink 18 months or more ago"  . Drug Use: No  . Sexual Activity: No   Other Topics Concern  . Not on file   Social History Narrative   Lives with wife in [place], [place].     History reviewed. No pertinent family history.  Review of Systems: A 12-system review of systems was negative, except as noted in the HPI  Labs:   Lab Results  Component Value Date   WBC 10.2 06/17/2013   HGB 10.4* 06/17/2013   HCT 33.7* 06/17/2013   MCV 94.7 06/17/2013   PLT 228 06/17/2013    Recent Labs Lab 06/17/13 2115  NA 140  K 4.4  CL 95*  CO2 36*  BUN 33*  CREATININE 1.29  CALCIUM 8.8  GLUCOSE 196*    Recent Labs  06/17/13 2115  TROPONINI 0.66*   Lab Results  Component Value Date   CHOL 115 04/20/2013   HDL 32* 04/20/2013   LDLCALC 59 04/20/2013   TRIG 119 04/20/2013    Radiology/Studies:  Dg Chest Portable 1 View  06/17/2013   CLINICAL DATA:  Respiratory distress, COPD, CHF  EXAM: PORTABLE CHEST - 1 VIEW  COMPARISON:  DG CHEST 2 VIEW dated 06/10/2013; CT CHEST W/CM dated 05/27/2013; DG CHEST 2 VIEW dated 06/03/2013; CT ANGIO CHEST W/CM &/OR WO/CM dated 10/23/2012; DG CHEST 1V PORT dated 10/23/2012  FINDINGS:  Partial left pneumonectomy. Bilateral emphysematous changes. 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 Bibasilar scarring again noted. Mild bilateral interstitial thickening likely chronic. No focal consolidation, pleural effusion or pneumothorax.  Stable cardiomediastinal silhouette.  The osseous structures are unremarkable.  IMPRESSION: No active cardiopulmonary disease.   Electronically Signed   By: Elige Ko   On: 06/17/2013 21:47    EKG: Normal sinus rhythm with right axis deviation, incomplete right bundle branch block, prominent R waves throughout the precordium suggestive of right ventricular hypertrophy, and nonspecific ST segment and T-wave changes.  Physical Exam: Blood pressure 148/99, pulse 74, temperature 96.5 F (35.8 C), temperature source Axillary, resp. rate 24, height 5\' 7"  (1.702 m), weight 71.1 kg (156 lb 12 oz), SpO2 97.00%. General: Well developed, well nourished, man lying comfortably in bed, wearing BiPAP. His wife and daughter are at the bedside. Head: Normocephalic, atraumatic, sclera non-icteric, no xanthomas, PERRLA.  Neck: Supple without lymphadenopathy or thyromegaly.  Unable to assess JVP and HJR accurately due to BiPAP mask/straps. Lungs: Bilateral rhonchi with fair air movement (right greater than left). Heart: RRR with S1 S2. No murmurs, rubs, or gallops appreciated. Abdomen: Soft, non-tender, mildly distended with normoactive bowel sounds. No hepatomegaly. No rebound/guarding. No obvious abdominal masses. Msk:  Strength and tone appear normal for age. Extremities: No clubbing or cyanosis. 2+ pretibial edema to the knees.  Distal pedal pulses are 2+ and equal bilaterally. Neuro: Alert and oriented X 3. No focal deficit. No facial asymmetry. Moves all extremities spontaneously. Psych:  Responds to questions appropriately with a normal affect.   ASSESSMENT AND PLAN:  58 year old man with multiple medical problems including coronary artery disease status post recent PCI  to the mid RCA, severe pulmonary hypertension and right heart failure, and severe oxygen and steroid dependent COPD.  NSTEMI: Currently, the patient is chest pain-free.  He reports intermittent chest tightness that has been present for months though it waxes and wanes in intensity.  The symptoms improved somewhat following his recent PCI but it is unclear if his symptoms are attributable to coronary arterial disease alone.  He underwent myocardial perfusion stress test following his PCI, which revealed no significant ischemia or scar.  His mild troponin elevation in the setting of nonspecific EKG changes and worsening shortness of breath with hypoxia could also be related to supply-demand mismatch.  I am concerned that he may be experiencing worsening right heart failure as the primary cause of his symptoms.  - Patient received aspirin 324 mg times one an outside hospital; continue aspirin 81 mg daily for now.  - Continue Effient and warfarin.  - Continue Crestor.  - Trend troponins and repeat EKG.  - There is no clear evidence for urgent left heart catheterization at this time.  - Given the patient's therapeutic INR and low suspicion for acute plaque rupture MRI, will discontinue heparin infusion at this time.  We will plan to continue triple therapy for now.  Shortness of breath cough: This is likely multifactorial, given the patient's history of severe lung disease, severe pulmonary hypertension, and right heart failure.  His lung sounds are rhonchorous, though he is moving air and does not have any obvious wheezes to suggest a significant COPD exacerbation. He already received high-dose IV steroids at the outside hospital.  I am more concerned that his dyspnea reflects worsening right heart failure in the setting of being off of his diuretics for at least 1 to 2 weeks.  He has evidence of significant peripheral edema with a progressively climbing pro-BNP; unfortunately assessment of his JVP is limited  by his CPAP.  Pulmonary embolism is less likely, as the patient has been therapeutically anticoagulated.  It should  be noted that he does have a history of RV thrombus, though this was not evident on his most recent echocardiogram.  His chest radiograph does not demonstrate evidence of infectious pneumonitis.  His white blood cell count has also normalized and overall is downtrending, though a left shift is noted. - Admit to step down.  - Continue BiPAP with titration of FiO2 to maintain oxygen saturation greater than 90%.  - Diurese gently with IV Lasix.  If renal function worsens or the patient's breathing does not improve, he may ultimately require repeat right heart catheterization to reestablish his left and right-sided filling pressures.  His mildly elevated creatinine of 1.3 is nonspecific and could also reflect venous congestion from elevated right-sided filling pressures.  - Continue home bronchodilator and inhaled corticosteroid therapy.  Restart home prednisone 10 mg daily.  - Will decrease metoprolol tartrate to 50 mg daily, given the potential for decompensated heart failure, though the effects of metoprolol on right heart failure in this setting are unclear.  Anemia: The patient appears to have a chronic normocytic anemia.  His hemoglobin upon presentation to [facility] yesterday was stable from his discharge a week ago.  This is most likely related to chronic disease, though no anemia labs are available for review in Epic.  Besides an episode of epistaxis during the patient's prior hospitalization, he denies bleeding.  - Check iron studies, vitamin B12, folate, and reticulocyte count.  Diabetes mellitus:  - Hold outpatient oral medications.  - Initiate sliding scale insulin with ACHS fingersticks.  Hypertension:  - Decrease metoprolol, as above.  - Continue clonidine and losartan.  - Discontinue ramipril, given the potential for increased nephrotoxicity with concurrent ACEI and ARB  therapy.  Paroxysmal atrial fibrillation: Patient is in normal sinus rhythm today with therapeutic INR.  - Decrease metoprolol, as above.  - Continue home digoxin dose and check digoxin level.  - Continue warfarin.  Diet:  - NPO while on BiPAP.  Code status:  - Full code (though patient has living will and would not like to be on indefinite life support).  Signed, Ilona Colley A. MD  06/18/2013, 2:15 AM

## 2013-06-19 ENCOUNTER — Encounter (HOSPITAL_COMMUNITY): Payer: Self-pay | Admitting: Physician Assistant

## 2013-06-19 ENCOUNTER — Ambulatory Visit: Payer: Managed Care, Other (non HMO) | Admitting: Cardiology

## 2013-06-19 DIAGNOSIS — R799 Abnormal finding of blood chemistry, unspecified: Secondary | ICD-10-CM

## 2013-06-19 DIAGNOSIS — J9620 Acute and chronic respiratory failure, unspecified whether with hypoxia or hypercapnia: Secondary | ICD-10-CM

## 2013-06-19 DIAGNOSIS — R0902 Hypoxemia: Secondary | ICD-10-CM

## 2013-06-19 DIAGNOSIS — R079 Chest pain, unspecified: Secondary | ICD-10-CM

## 2013-06-19 LAB — PROTIME-INR
INR: 1.92 — ABNORMAL HIGH (ref 0.00–1.49)
INR: 2.18 — ABNORMAL HIGH (ref 0.00–1.49)
PROTHROMBIN TIME: 23.6 s — AB (ref 11.6–15.2)
Prothrombin Time: 21.4 seconds — ABNORMAL HIGH (ref 11.6–15.2)

## 2013-06-19 LAB — GLUCOSE, CAPILLARY
GLUCOSE-CAPILLARY: 160 mg/dL — AB (ref 70–99)
GLUCOSE-CAPILLARY: 126 mg/dL — AB (ref 70–99)
Glucose-Capillary: 132 mg/dL — ABNORMAL HIGH (ref 70–99)
Glucose-Capillary: 92 mg/dL (ref 70–99)

## 2013-06-19 MED ORDER — SODIUM CHLORIDE 0.9 % IJ SOLN: 3.0000 mL | INTRAMUSCULAR | Status: DC | PRN

## 2013-06-19 MED ORDER — SODIUM CHLORIDE 0.9 % IV SOLN: 250.0000 mL | INTRAVENOUS | Status: DC | PRN

## 2013-06-19 MED ORDER — SODIUM CHLORIDE 0.9 % IV SOLN
1.0000 mL/kg/h | INTRAVENOUS | Status: DC
  Administered 2013-06-20: 1 mL/kg/h via INTRAVENOUS

## 2013-06-19 MED ORDER — FUROSEMIDE 10 MG/ML IJ SOLN
40.0000 mg | Freq: Two times a day (BID) | INTRAMUSCULAR | Status: DC
  Administered 2013-06-20 – 2013-06-23 (×8): 40 mg via INTRAVENOUS
  Filled 2013-06-19 (×9): qty 4

## 2013-06-19 MED ORDER — ASPIRIN 81 MG PO CHEW
81.0000 mg | CHEWABLE_TABLET | ORAL | Status: AC
  Administered 2013-06-20: 81 mg via ORAL
  Filled 2013-06-19: qty 1

## 2013-06-19 MED ORDER — ALBUTEROL SULFATE (2.5 MG/3ML) 0.083% IN NEBU: 2.5000 mg | INHALATION_SOLUTION | Freq: Four times a day (QID) | RESPIRATORY_TRACT | Status: DC

## 2013-06-19 MED ORDER — ALBUTEROL SULFATE (2.5 MG/3ML) 0.083% IN NEBU
2.5000 mg | INHALATION_SOLUTION | Freq: Four times a day (QID) | RESPIRATORY_TRACT | Status: DC
  Administered 2013-06-20 – 2013-06-22 (×7): 2.5 mg via RESPIRATORY_TRACT
  Filled 2013-06-19 (×7): qty 3

## 2013-06-19 MED ORDER — SODIUM CHLORIDE 0.9 % IJ SOLN
3.0000 mL | Freq: Two times a day (BID) | INTRAMUSCULAR | Status: DC
  Administered 2013-06-19 – 2013-06-20 (×2): 3 mL via INTRAVENOUS

## 2013-06-19 NOTE — Progress Notes (Signed)
Will be rescheduled for tomorrow morning.

## 2013-06-19 NOTE — Care Management Note (Signed)
    Page 1 of 2   06/23/2013     10:04:53 AM   CARE MANAGEMENT NOTE 06/23/2013  Patient:  KYIAN, OBST   Account Number:  [id number]  Date Initiated:  06/18/2013  Documentation initiated by:  Marvetta Gibbons  Subjective/Objective Assessment:   Pt admitted with Resp. Failure, CHF, elevated trop.     Action/Plan:   PTA pt lived at home with wife- NCM to follow pt progression for d/c needs- has home 02-- and active with [facility]- RN/PT/OT/CSW- will need resumption orders at time of discharge   Anticipated DC Date:  06/23/2013   Anticipated DC Plan:  [place]  CM consult      PAC Choice  HOSPICE   Choice offered to / List presented to:  C-3 Spouse   DME arranged  HOSPITAL BED  OVERBED TABLE      DME agency  OTHER - SEE NOTE     HH arranged  HH-1 RN  [place] agency  HOSPICE   Status of service:  Completed, signed off Medicare Important Message given?   (If response is "NO", the following Medicare IM given date fields will be blank) Date Medicare IM given:   Date Additional Medicare IM given:    Discharge Disposition:  [place]  Per UR Regulation:  Reviewed for med. necessity/level of care/duration of stay  If discussed at [place] of Stay Meetings, dates discussed:   06/24/2013    Comments:  3/23  0959 debbie Kayon Dozier rn,bsn spoke w pt and wife. [facility] has no contracts w hospice but pay hospice in [place] pt lives as in network. [facility]. [facility] inform [facility] and [facility] at [facility]. [facility] but pt and wife agreeable to switching out w company hospice uses. have req elect hosp bed-overbed table-full face mask for bipap. he gets o2 and other eq from [facility]. wife to take pt home w auto today.  06/22/13 13:45 CM met with pt and wife, Rosaria Ferries [phone] to offer choice for [facility]).  Rosaria Ferries stated she went through this 2 years ago   with her mother and is concerned if she chooses an agency before we check it out with her insurance, it may not be covered. She understands with it being Sunday, [employer] will not be available until tomorrow for an insurance check.  Rosaria Ferries states the only DME she feels she will need is an electric bed; at present they have a manual roll handled from [facility]. She also would like a full face mask for oxygen.  CM will continue to follow to arrange for Hospice at home.  Mariane Masters, BSN, CM [phone].   HEATHER  @ [place]  PHONE # [phone]  X [phone]   *** CONTACT FOR D/C PLANNING ****                FOR  H H , SNF

## 2013-06-19 NOTE — Progress Notes (Addendum)
    Subjective: Denies further chest pain, and dyspnea is much improved. Still has lower extremity edema. He is also on higher than his usual oxygen setting.    Objective: Vital signs in last 24 hours: Temp:  [97.3 F (36.3 C)-98 F (36.7 C)] 97.3 F (36.3 C) (03/19 1116) Pulse Rate:  [62-79] 62 (03/19 1127) Resp:  [12-28] 12 (03/19 0341) BP: (110-139)/(80-92) 139/92 mmHg (03/19 0829) SpO2:  [90 %-97 %] 92 % (03/19 0825) FiO2 (%):  [40 %-55 %] 45 % (03/19 0332) Weight:  [155 lb 6.8 oz (70.5 kg)] 155 lb 6.8 oz (70.5 kg) (03/19 0332) Last BM Date: 06/18/13  Intake/Output from previous day: 03/18 0701 - 03/19 0700 In: 840 [P.O.:840] Out: 850 [Urine:850] Intake/Output this shift: Total I/O In: -  Out: 650 [Urine:650]  Medications: reviewed on Epic  PE: General appearance: alert, cooperative and no distress Neck: mild JVD Lungs: faint bibasilar rales; overall increased worker breathing. Abdomen: soft, non-tender; bowel sounds normal; no masses,  HSM remains present but not as pulsatile Extremities: 2+ bilateral LEE -  to mid calf Pulses: 2+ and symmetric Skin: warm and dry Neurologic: Grossly normal  Lab Results:   Recent Labs  06/17/13 2115 06/18/13 0300  WBC 10.2 10.3  HGB 10.4* 10.9*  HCT 33.7* 35.3*  PLT 228 251   BMET  Recent Labs  06/17/13 2115 06/18/13 0300  NA 140 142  K 4.4 4.6  CL 95* 94*  CO2 36* 33*  GLUCOSE 196* 164*  BUN 33* 32*  CREATININE 1.29 1.21  CALCIUM 8.8 9.1   PT/INR  Recent Labs  06/17/13 2227 06/18/13 0500 06/19/13 0436  LABPROT 24.5* 22.1* 21.4*  INR 2.29* 2.00* 1.92*   Cardiac Panel (last 3 results)  Recent Labs  06/17/13 2115 06/18/13 0300  TROPONINI 0.66* 0.69*     Assessment/Plan  Principal Problem:   Right heart failure Active Problems:   COPD exacerbation   Respiratory failure   NSTEMI (non-ST elevated myocardial infarction)    LOS: 2 days    Right and left heart catheterization today  (pending recheck of INR) to evaluate coronary anatomy as well as existing pulmonary attention with the possibility of additional pulmonary venous congestion with elevated wedge pressures and left ventricular end-diastolic pressures.  Obviously if CAD is present would treat accordingly.  But otherwise used the numbers from right heart catheterization to diuresis and after the reduction; would also consider the possibility of milrinone  Continue IV Lasix for now.  On amiodarone for rate rate/rhythm control.  On multiple blood pressure medications including clonidine. Will adjust as needed for the reduction.  On dual antiplatelet therapy with aspirin and Effient for his recent stent.   I've explained the procedure, with risks, benefits, alternatives and indications the patient in great detail. Indications include acute heart failure with existing right heart failure from pulmonary retention and potentially systolic/diastolic left heart failure, mild non-STEMI with known CAD and recent PCI -- associated with chest pain on initial evaluation.  Marykay Lex, M.D., M.S. Interventional Cardiologist  [facility] Pager # [phone] 06/19/2013     Patient's INR was too high at 2.18 and his cardiac cath was rescheduled for tomorrow. This was explained to the patient and he verbalized understanding.   Thereasa Parkin PA-C  [facility]

## 2013-06-19 NOTE — Progress Notes (Signed)
[facility]  Patient Status: Active (receiving services up to time of hospitalization)  [facility] is providing the following services: RN, PT, OT and MSW  If patient discharges after hours, please call [phone].   Danny [name] 06/19/2013, 10:42 AM

## 2013-06-19 NOTE — Progress Notes (Signed)
Utilization review completed.  

## 2013-06-20 ENCOUNTER — Encounter (HOSPITAL_COMMUNITY): Payer: Managed Care, Other (non HMO)

## 2013-06-20 ENCOUNTER — Encounter (HOSPITAL_COMMUNITY)
Admission: EM | Disposition: A | Discharge status: Hospice | Payer: Self-pay | Source: Home / Self Care | Attending: Cardiology

## 2013-06-20 DIAGNOSIS — R0602 Shortness of breath: Secondary | ICD-10-CM

## 2013-06-20 HISTORY — PX: LEFT AND RIGHT HEART CATHETERIZATION WITH CORONARY ANGIOGRAM: SHX5449

## 2013-06-20 LAB — GLUCOSE, CAPILLARY
GLUCOSE-CAPILLARY: 109 mg/dL — AB (ref 70–99)
GLUCOSE-CAPILLARY: 77 mg/dL (ref 70–99)
Glucose-Capillary: 100 mg/dL — ABNORMAL HIGH (ref 70–99)
Glucose-Capillary: 94 mg/dL (ref 70–99)

## 2013-06-20 LAB — POCT I-STAT 3, VENOUS BLOOD GAS (G3P V)
ACID-BASE EXCESS: 14 mmol/L — AB (ref 0.0–2.0)
Acid-Base Excess: 14 mmol/L — ABNORMAL HIGH (ref 0.0–2.0)
Acid-Base Excess: 15 mmol/L — ABNORMAL HIGH (ref 0.0–2.0)
Acid-Base Excess: 15 mmol/L — ABNORMAL HIGH (ref 0.0–2.0)
Acid-Base Excess: 15 mmol/L — ABNORMAL HIGH (ref 0.0–2.0)
BICARBONATE: 42.5 meq/L — AB (ref 20.0–24.0)
Bicarbonate: 42 mEq/L — ABNORMAL HIGH (ref 20.0–24.0)
Bicarbonate: 42.4 mEq/L — ABNORMAL HIGH (ref 20.0–24.0)
Bicarbonate: 42.8 mEq/L — ABNORMAL HIGH (ref 20.0–24.0)
Bicarbonate: 43.1 mEq/L — ABNORMAL HIGH (ref 20.0–24.0)
O2 SAT: 36 %
O2 SAT: 39 %
O2 Saturation: 42 %
O2 Saturation: 43 %
O2 Saturation: 50 %
PCO2 VEN: 69.3 mmHg — AB (ref 45.0–50.0)
PH VEN: 7.391 — AB (ref 7.250–7.300)
PH VEN: 7.4 — AB (ref 7.250–7.300)
PO2 VEN: 23 mmHg — AB (ref 30.0–45.0)
PO2 VEN: 25 mmHg — AB (ref 30.0–45.0)
PO2 VEN: 26 mmHg — AB (ref 30.0–45.0)
TCO2: 44 mmol/L (ref 0–100)
TCO2: 45 mmol/L (ref 0–100)
TCO2: 44 mmol/L (ref 0–100)
TCO2: 45 mmol/L (ref 0–100)
TCO2: 45 mmol/L (ref 0–100)
pCO2, Ven: 67.9 mmHg — ABNORMAL HIGH (ref 45.0–50.0)
pCO2, Ven: 68.5 mmHg — ABNORMAL HIGH (ref 45.0–50.0)
pCO2, Ven: 69.4 mmHg — ABNORMAL HIGH (ref 45.0–50.0)
pCO2, Ven: 70 mmHg — ABNORMAL HIGH (ref 45.0–50.0)
pH, Ven: 7.39 — ABNORMAL HIGH (ref 7.250–7.300)
pH, Ven: 7.401 — ABNORMAL HIGH (ref 7.250–7.300)
pH, Ven: 7.408 — ABNORMAL HIGH (ref 7.250–7.300)
pO2, Ven: 24 mmHg — CL (ref 30.0–45.0)
pO2, Ven: 28 mmHg — CL (ref 30.0–45.0)

## 2013-06-20 LAB — POCT I-STAT 3, ART BLOOD GAS (G3+)
ACID-BASE EXCESS: 13 mmol/L — AB (ref 0.0–2.0)
Acid-Base Excess: 13 mmol/L — ABNORMAL HIGH (ref 0.0–2.0)
BICARBONATE: 40 meq/L — AB (ref 20.0–24.0)
Bicarbonate: 40.6 mEq/L — ABNORMAL HIGH (ref 20.0–24.0)
O2 Saturation: 83 %
O2 Saturation: 84 %
PCO2 ART: 63.5 mmHg — AB (ref 35.0–45.0)
PH ART: 7.413 (ref 7.350–7.450)
TCO2: 42 mmol/L (ref 0–100)
TCO2: 42 mmol/L (ref 0–100)
pCO2 arterial: 62.7 mmHg (ref 35.0–45.0)
pH, Arterial: 7.413 (ref 7.350–7.450)
pO2, Arterial: 49 mmHg — ABNORMAL LOW (ref 80.0–100.0)
pO2, Arterial: 50 mmHg — ABNORMAL LOW (ref 80.0–100.0)

## 2013-06-20 LAB — PROTIME-INR
INR: 1.87 — ABNORMAL HIGH (ref 0.00–1.49)
INR: 1.6 — ABNORMAL HIGH (ref 0.00–1.49)
PROTHROMBIN TIME: 21 s — AB (ref 11.6–15.2)
Prothrombin Time: 18.6 seconds — ABNORMAL HIGH (ref 11.6–15.2)

## 2013-06-20 LAB — POCT ACTIVATED CLOTTING TIME: Activated Clotting Time: 116 seconds

## 2013-06-20 SURGERY — LEFT AND RIGHT HEART CATHETERIZATION WITH CORONARY ANGIOGRAM

## 2013-06-20 MED ORDER — NITROGLYCERIN 0.2 MG/ML ON CALL CATH LAB
INTRAVENOUS | Status: AC
  Filled 2013-06-20: qty 1

## 2013-06-20 MED ORDER — MIDAZOLAM HCL 2 MG/2ML IJ SOLN
INTRAMUSCULAR | Status: AC
  Filled 2013-06-20: qty 2

## 2013-06-20 MED ORDER — LIDOCAINE HCL (PF) 1 % IJ SOLN
INTRAMUSCULAR | Status: AC
  Filled 2013-06-20: qty 30

## 2013-06-20 MED ORDER — HEPARIN (PORCINE) IN NACL 100-0.45 UNIT/ML-% IJ SOLN
950.0000 [IU]/h | INTRAMUSCULAR | Status: DC
  Administered 2013-06-21: 950 [IU]/h via INTRAVENOUS
  Filled 2013-06-20 (×2): qty 250

## 2013-06-20 MED ORDER — SILDENAFIL CITRATE 20 MG PO TABS
20.0000 mg | ORAL_TABLET | Freq: Three times a day (TID) | ORAL | Status: DC
  Administered 2013-06-20 – 2013-06-21 (×4): 20 mg via ORAL
  Filled 2013-06-20 (×4): qty 1

## 2013-06-20 MED ORDER — SODIUM CHLORIDE 0.9 % IV SOLN: INTRAVENOUS | Status: DC

## 2013-06-20 MED ORDER — HEPARIN (PORCINE) IN NACL 2-0.9 UNIT/ML-% IJ SOLN
INTRAMUSCULAR | Status: AC
  Filled 2013-06-20: qty 1000

## 2013-06-20 MED ORDER — FENTANYL CITRATE 0.05 MG/ML IJ SOLN
INTRAMUSCULAR | Status: AC
  Filled 2013-06-20: qty 2

## 2013-06-20 NOTE — Progress Notes (Signed)
Pt placed on BIPAP around 0830 by MD to give pt more O2 prior to cath lab trip scheduled for today. RT notified by RN that pt was on BIPAP. Pt tolerating well at this time. RT will continue to monitor.

## 2013-06-20 NOTE — CV Procedure (Addendum)
Danny [name] is a 58 y.o. male    [id number]  [id number] LOCATION:  FACILITY: [facility]  PHYSICIAN: Lennette Biharihomas A. Kelly, MD, [facility] 05-20-1955   DATE OF PROCEDURE:  06/20/2013    CARDIAC CATHETERIZATION    HISTORY:   Mr. Carolin CoyRoach is a 58 year old African American gentleman who is status post prior stenting to his right coronary artery. He has previously documented severe pulmonary hypertension with PA pressure at 70 mm at catheterization in January 2015. He does have COPD, a history of PAF, diabetes mellitus, and prior RV thrombus on chronic anticoagulation.He has had increasing shortness of breath. He has significant oxygen desaturation with sitting upright. He now is referred for right and left heart catheterization.   PROCEDURE:  Upon arrival to the catheterization laboratory, the patient was on a 50% oxygen. He was treated with Versed 1 mg and fentanyl 25 mcg for conscious sedation. Right and left heart catheterization was performed via the right femoral artery and right femoral vein. A Swan-Ganz catheter was advanced into the 7 French venous sheath and pressures were recorded in the right atrium, right ventricle, pulmonary artery, and pulmonary capillary wedge positions. Cardiac output was determined by the thermal dilution and assumed Fick methods. Oxygen saturation was obtained in the aorta as well as pulmonary artery.  Due to concerns of possible pulmonary AVMs, oxygen saturations were also obtained in the right ventricle, right atrium, and inferior vena cava. Simultaneous central aortic pressure obtained with a 5 French pigtail catheter and pulmonary artery pressures were recorded. Multiple attempts were made to pass this pigtail catheter into the left ventricle each time the patient would develop ventricular tachycardia. This catheter was then removed and exchanged for a 4 French pigtail catheter and again recurrent ventricular tachycardia would occur each time the catheter into the left ventricle.  This was then removed and a multipurpose catheter was then inserted. Ultimately a stable pressure was obtained and simultaneous left ventricular and pulmonary A. Wedge pressures were recorded. A hand injection to assess left ventriculography was done. Diagnostic catheterization of the coronary arteries were done utilizing 5 French Judkins 4 left and right catheters. Hemostasis was obtained by direct manual pressure. The patient tolerated the procedure well.   HEMODYNAMICS:   RA: 14/9 mean 7 RV: 94/8 PA: 94/46   Simultaneous PA 94/41 and Ao: 119/77  Simultaneous  LV: 115/10 and Pc: mean 10  Pullback  LV: 107/5 and Ao: 106/72  Oxygen saturation: Ao: 83%                                 PA: 36%                                 RV: 43%                                 RA: 50%                                 IVC: 39%  Cardiac output: 3.1(Thermodilution);  4.8 (Fick) Cardiac index:   1.7                              2.7  ANGIOGRAPHY:  Left main coronary artery was angiographically normal and bifurcated into the LAD and left circumflex vessel.  The LAD was angiographically normal it gave rise to several septal perforating arteries and diagonal vessels.  The left circumflex vessel is angiographically normal and gave rise to one major marginal branch.  The RCA was angiographically normal and had a widely patent stent in its mid segment.  Hand injection left ventriculography was done with suboptimal visualization but the ejection fraction appeared in the range of 45-50%. There was subtle mild residual mid inferior hypocontractility.   IMPRESSION:  Very severe pulmonary hypertension with a PA systolic pressure of 94 mm  Significant oxygen desaturation  Mild LV dysfunction  Widely patent mid RCA stent and otherwise normal coronary arteries.  RECOMMENDATION:  The patient has a history of severe right-sided dysfunction with significant oxygen desaturation and has significant   progression of his pulmonary hypertension. The catheterization findings were discussed with Dr. Herbie Baltimore who also discussed them with Dr. Shirlee Latch. Patient will be scheduled for PFTs. He will also undergo a VQ scan to assess for potential chronic pulmonary emboli and coumadin will be reinstituted. He will be started on Revatio 20 mg tid for initial treatment of his pulmonary hypertension and HIV screen will be obtained.   Lennette Bihari, MD, [facility] 06/20/2013 7:55 PM

## 2013-06-20 NOTE — Progress Notes (Signed)
Neb treatment started by RN. Pt tolerated well, placed back on 50%VM after neb was finished. RT will continue to monitor.

## 2013-06-20 NOTE — Progress Notes (Signed)
ANTICOAGULATION CONSULT NOTE - Follow Up Consult  Pharmacy Consult for Heparin Indication: chest pain/ACS and atrial fibrillation   Allergies  Allergen Reactions  . Lipitor [Atorvastatin] Other (See Comments)    Muscle spasms     Patient Measurements: Height: 5\' 7"  (170.2 cm) Weight: 148 lb 8 oz (67.359 kg) IBW/kg (Calculated) : 66.1 Heparin Dosing Weight: 67.4 kg  Vital Signs: Temp: 97.5 F (36.4 C) (03/20 1938) Temp src: Oral (03/20 1938) BP: 145/91 mmHg (03/20 2000) Pulse Rate: 70 (03/20 2000)  Labs:  Recent Labs  06/17/13 2115  06/18/13 0300  06/19/13 1330 06/20/13 0344 06/20/13 1205  HGB 10.4*  --  10.9*  --   --   --   --   HCT 33.7*  --  35.3*  --   --   --   --   PLT 228  --  251  --   --   --   --   APTT  --   --  71*  --   --   --   --   LABPROT  --   < >  --   < > 23.6* 21.0* 18.6*  INR  --   < >  --   < > 2.18* 1.87* 1.60*  CREATININE 1.29  --  1.21  --   --   --   --   TROPONINI 0.66*  --  0.69*  --   --   --   --   < > = values in this interval not displayed.  Estimated Creatinine Clearance: 63.7 ml/min (by C-G formula based on Cr of 1.21).  Assessment:  s/p cardiac cath. Heparin drip to resume 12 hrs after sheath out.  Sheath removed ~6:15pm.  Site noted without bleeding or hematoma.  Coumadin to resume on 3/21.  INR down to 1.6  Goal of Therapy:  Heparin level 0.3-0.7 units/ml Monitor platelets by anticoagulation protocol: Yes   Plan:   Heparin to resume at 6:30am on 3/21 at 950 units/hr (~14 units/kg/hr).  Heparin level ~ 6hr after drip resumes.  Daily heparin level, CBC and PT/INR.  Resume Coumadin on 3/21.  Dennie Fetters, [place] Pager: [phone] 06/20/2013,9:01 PM

## 2013-06-20 NOTE — H&P (View-Only) (Signed)
Subjective: Denies further chest pain.  Still a bit SOB & desats with movement & sitting up --> despite monitor reading SaO2 of 77%,he is mentating fine with no visual changes or other neurologic abnormalities. Did not use CPAP o/n.   Objective: Vital signs in last 24 hours: Temp:  [97.3 F (36.3 C)-98.4 F (36.9 C)] 98 F (36.7 C) (03/20 0718) Pulse Rate:  [61-74] 67 (03/20 0718) Resp:  [10-23] 23 (03/20 0718) BP: (108-139)/(75-92) 129/83 mmHg (03/20 0718) SpO2:  [69 %-97 %] 93 % (03/20 0718) FiO2 (%):  [50 %] 50 % (03/20 0718) Weight:  [148 lb 8 oz (67.359 kg)] 148 lb 8 oz (67.359 kg) (03/20 0447) Last BM Date: 06/19/13  Intake/Output from previous day: 03/19 0701 - 03/20 0700 In: 451.5 [P.O.:240; I.V.:211.5] Out: 1300 [Urine:1300] Intake/Output this shift:    Medications: Reviewed on Epic.   PE: General appearance: alert, cooperative and no distress  Neck: mild JVD - pulsatile Lungs: faint bibasilar rales; overall increased worker breathing.  Abdomen: soft, non-tender; bowel sounds normal; no masses, HSM remains present but not as pulsatile  Extremities: 2+ bilateral LEE - to mid calf  Pulses: 2+ and symmetric  Skin: warm and dry  Neurologic: Grossly normal   Lab Results:   Recent Labs  06/17/13 2115 06/18/13 0300  WBC 10.2 10.3  HGB 10.4* 10.9*  HCT 33.7* 35.3*  PLT 228 251   BMET  Recent Labs  06/17/13 2115 06/18/13 0300  NA 140 142  K 4.4 4.6  CL 95* 94*  CO2 36* 33*  GLUCOSE 196* 164*  BUN 33* 32*  CREATININE 1.29 1.21  CALCIUM 8.8 9.1   PT/INR  Recent Labs  06/19/13 0436 06/19/13 1330 06/20/13 0344  LABPROT 21.4* 23.6* 21.0*  INR 1.92* 2.18* 1.87*   Cardiac Panel (last 3 results)  Recent Labs  06/17/13 2115 06/18/13 0300  TROPONINI 0.66* 0.69*     Assessment/Plan  Principal Problem:   Right heart failure Active Problems:   COPD exacerbation   Respiratory failure   NSTEMI (non-ST elevated myocardial infarction)   LOS: 3 days    Right and left heart catheterization today (postponed yesterday due to elevated INR) to evaluate coronary anatomy as well as existing pulmonary attention with the possibility of additional pulmonary venous congestion with elevated wedge pressures and left ventricular end-diastolic pressures.   Obviously if CAD is present would treat accordingly.   But otherwise used the numbers from right heart catheterization to diuresis and after the reduction; would also consider the possibility of milrinone   Continue IV Lasix for now ( changed to BID) -- with continued desaturations, ? PCCM consult; will put him on CPAP for a while this AM (will have ABG during cath) ? If probe is picking up correctly.  ? With orthodeoxia, >? Pulm AVMs (will be interesting to see Sats from R[facility])  On amiodarone for rate rate/rhythm control.  In NSR  On multiple blood pressure medications including clonidine. Will adjust as needed for the reduction.  On dual antiplatelet therapy with aspirin and Effient for his recent stent.   I've explained the procedure, with risks, benefits, alternatives and indications the patient in great detail.  Indications include acute heart failure with existing right heart failure from pulmonary retention and potentially systolic/diastolic left heart failure, mild non-STEMI with known CAD and recent PCI -- associated with chest pain on initial evaluation.   For [facility] -- please check Sat Run from IVC - RA - RV & PA  Marykay Lex, M.D., M.S. Interventional Cardiologist  [facility] Pager # [phone] 06/20/2013

## 2013-06-20 NOTE — Progress Notes (Signed)
 Subjective: Denies further chest pain.  Still a bit SOB & desats with movement & sitting up --> despite monitor reading SaO2 of 77%,he is mentating fine with no visual changes or other neurologic abnormalities. Did not use CPAP o/n.   Objective: Vital signs in last 24 hours: Temp:  [97.3 F (36.3 C)-98.4 F (36.9 C)] 98 F (36.7 C) (03/20 0718) Pulse Rate:  [61-74] 67 (03/20 0718) Resp:  [10-23] 23 (03/20 0718) BP: (108-139)/(75-92) 129/83 mmHg (03/20 0718) SpO2:  [69 %-97 %] 93 % (03/20 0718) FiO2 (%):  [50 %] 50 % (03/20 0718) Weight:  [148 lb 8 oz (67.359 kg)] 148 lb 8 oz (67.359 kg) (03/20 0447) Last BM Date: 06/19/13  Intake/Output from previous day: 03/19 0701 - 03/20 0700 In: 451.5 [P.O.:240; I.V.:211.5] Out: 1300 [Urine:1300] Intake/Output this shift:    Medications: Reviewed on Epic.   PE: General appearance: alert, cooperative and no distress  Neck: mild JVD - pulsatile Lungs: faint bibasilar rales; overall increased worker breathing.  Abdomen: soft, non-tender; bowel sounds normal; no masses, HSM remains present but not as pulsatile  Extremities: 2+ bilateral LEE - to mid calf  Pulses: 2+ and symmetric  Skin: warm and dry  Neurologic: Grossly normal   Lab Results:   Recent Labs  06/17/13 2115 06/18/13 0300  WBC 10.2 10.3  HGB 10.4* 10.9*  HCT 33.7* 35.3*  PLT 228 251   BMET  Recent Labs  06/17/13 2115 06/18/13 0300  NA 140 142  K 4.4 4.6  CL 95* 94*  CO2 36* 33*  GLUCOSE 196* 164*  BUN 33* 32*  CREATININE 1.29 1.21  CALCIUM 8.8 9.1   PT/INR  Recent Labs  06/19/13 0436 06/19/13 1330 06/20/13 0344  LABPROT 21.4* 23.6* 21.0*  INR 1.92* 2.18* 1.87*   Cardiac Panel (last 3 results)  Recent Labs  06/17/13 2115 06/18/13 0300  TROPONINI 0.66* 0.69*     Assessment/Plan  Principal Problem:   Right heart failure Active Problems:   COPD exacerbation   Respiratory failure   NSTEMI (non-ST elevated myocardial infarction)   LOS: 3 days    Right and left heart catheterization today (postponed yesterday due to elevated INR) to evaluate coronary anatomy as well as existing pulmonary attention with the possibility of additional pulmonary venous congestion with elevated wedge pressures and left ventricular end-diastolic pressures.   Obviously if CAD is present would treat accordingly.   But otherwise used the numbers from right heart catheterization to diuresis and after the reduction; would also consider the possibility of milrinone   Continue IV Lasix for now ( changed to BID) -- with continued desaturations, ? PCCM consult; will put him on CPAP for a while this AM (will have ABG during cath) ? If probe is picking up correctly.  ? With orthodeoxia, >? Pulm AVMs (will be interesting to see Sats from [facility])  On amiodarone for rate rate/rhythm control.  In NSR  On multiple blood pressure medications including clonidine. Will adjust as needed for the reduction.  On dual antiplatelet therapy with aspirin and Effient for his recent stent.   I've explained the procedure, with risks, benefits, alternatives and indications the patient in great detail.  Indications include acute heart failure with existing right heart failure from pulmonary retention and potentially systolic/diastolic left heart failure, mild non-STEMI with known CAD and recent PCI -- associated with chest pain on initial evaluation.   For [facility] -- please check Sat Run from IVC - RA - RV & PA    Marykay Lex, M.D., M.S. Interventional Cardiologist  [facility] Pager # [phone] 06/20/2013

## 2013-06-20 NOTE — Interval H&P Note (Signed)
History and Physical Interval Note:  06/20/2013 4:04 PM  Danny [name]  has presented today for surgery, with the diagnosis of CP  The various methods of treatment have been discussed with the patient and family. After consideration of risks, benefits and other options for treatment, the patient has consented to  Procedure(s): RIGHT Cath Lab Visit (complete for each Cath Lab visit)  Clinical Evaluation Leading to the Procedure:   ACS: no  Non-ACS:    Anginal Classification: CCS IV  Anti-ischemic medical therapy: Maximal Therapy (2 or more classes of medications)  Non-Invasive Test Results: No non-invasive testing performed  Prior CABG: No previous CABG      and  LEFT HEART CATHETERIZATION WITH CORONARY ANGIOGRAM (N/[name]) as [name] surgical intervention .  The patient's history has been reviewed, patient examined, no change in status, stable for surgery.  I have reviewed the patient's chart and labs.  Questions were answered to the patient's satisfaction.     Danny [name]

## 2013-06-21 ENCOUNTER — Inpatient Hospital Stay (HOSPITAL_COMMUNITY): Payer: Managed Care, Other (non HMO)

## 2013-06-21 LAB — CBC
HEMATOCRIT: 37.2 % — AB (ref 39.0–52.0)
Hemoglobin: 11.2 g/dL — ABNORMAL LOW (ref 13.0–17.0)
MCH: 28.5 pg (ref 26.0–34.0)
MCHC: 30.1 g/dL (ref 30.0–36.0)
MCV: 94.7 fL (ref 78.0–100.0)
Platelets: 206 10*3/uL (ref 150–400)
RBC: 3.93 MIL/uL — ABNORMAL LOW (ref 4.22–5.81)
RDW: 17.4 % — AB (ref 11.5–15.5)
WBC: 8.3 10*3/uL (ref 4.0–10.5)

## 2013-06-21 LAB — GLUCOSE, CAPILLARY
GLUCOSE-CAPILLARY: 205 mg/dL — AB (ref 70–99)
GLUCOSE-CAPILLARY: 182 mg/dL — AB (ref 70–99)
Glucose-Capillary: 137 mg/dL — ABNORMAL HIGH (ref 70–99)
Glucose-Capillary: 138 mg/dL — ABNORMAL HIGH (ref 70–99)

## 2013-06-21 LAB — HIV ANTIBODY (ROUTINE TESTING W REFLEX): HIV: NONREACTIVE

## 2013-06-21 LAB — PROTIME-INR
INR: 1.39 (ref 0.00–1.49)
Prothrombin Time: 16.7 seconds — ABNORMAL HIGH (ref 11.6–15.2)

## 2013-06-21 LAB — HEPARIN LEVEL (UNFRACTIONATED)
HEPARIN UNFRACTIONATED: 0.14 [IU]/mL — AB (ref 0.30–0.70)
Heparin Unfractionated: 0.14 IU/mL — ABNORMAL LOW (ref 0.30–0.70)

## 2013-06-21 MED ORDER — WARFARIN - PHARMACIST DOSING INPATIENT: Freq: Every day | Status: DC

## 2013-06-21 MED ORDER — HEPARIN (PORCINE) IN NACL 100-0.45 UNIT/ML-% IJ SOLN
1300.0000 [IU]/h | INTRAMUSCULAR | Status: DC
  Administered 2013-06-22: 1150 [IU]/h via INTRAVENOUS
  Administered 2013-06-23: 1300 [IU]/h via INTRAVENOUS
  Filled 2013-06-21 (×4): qty 250

## 2013-06-21 MED ORDER — WARFARIN SODIUM 2 MG PO TABS
2.0000 mg | ORAL_TABLET | Freq: Once | ORAL | Status: AC
  Administered 2013-06-21: 2 mg via ORAL
  Filled 2013-06-21: qty 1

## 2013-06-21 NOTE — Progress Notes (Signed)
ANTICOAGULATION CONSULT NOTE - Follow Up Consult  Pharmacy Consult for Heparin Indication: chest pain/ACS and atrial fibrillation   Allergies  Allergen Reactions  . Lipitor [Atorvastatin] Other (See Comments)    Muscle spasms     Patient Measurements: Height: 5\' 7"  (170.2 cm) Weight: 147 lb 9.6 oz (66.951 kg) IBW/kg (Calculated) : 66.1 Heparin Dosing Weight: 67.4 kg  Vital Signs: Temp: 98.1 F (36.7 C) (03/21 1200) Temp src: Oral (03/21 1200) BP: 100/65 mmHg (03/21 1200) Pulse Rate: 61 (03/21 1200)  Labs:  Recent Labs  06/20/13 0344 06/20/13 1205 06/21/13 0325 06/21/13 1338  HGB  --   --  11.2*  --   HCT  --   --  37.2*  --   PLT  --   --  206  --   LABPROT 21.0* 18.6* 16.7*  --   INR 1.87* 1.60* 1.39  --   HEPARINUNFRC  --   --   --  0.14*    Estimated Creatinine Clearance: 63.7 ml/min (by C-G formula based on Cr of 1.21).  Assessment:  s/p cardiac cath. Heparin drip to resume 12 hrs after sheath out.  Sheath removed ~6:15pm.  Site noted without bleeding or hematoma.  Coumadin to resume on 3/21.  INR down to 1.6.  Heparin resumed at 0630 on 3/21, HL post 6 hrs is 0.14 and subtherapeutic.  Will increase heparin dose by 3 units/kg/hr and recheck HL in 6 hrs.  Goal of Therapy:  Heparin level 0.3-0.7 units/ml Monitor platelets by anticoagulation protocol: Yes   Plan:  Increase heparin to 1150 units/hr Recheck heparin a level at 2100  Daily heparin level, CBC and PT/INR.  Resumed Coumadin on 3/21 tonight for one dose.  Anabel Bene, PharmD Clinical Pharmacist Pager: [phone]  Anabel Bene, RPh 06/21/2013,2:18 PM

## 2013-06-21 NOTE — Progress Notes (Signed)
ANTICOAGULATION CONSULT NOTE - Initial Consult  Pharmacy Consult for Warfarin  Indication: atrial fibrillation, prior RV thrombus  Allergies  Allergen Reactions  . Lipitor [Atorvastatin] Other (See Comments)    Muscle spasms     Patient Measurements: Height: 5\' 7"  (170.2 cm) Weight: 148 lb 8 oz (67.359 kg) IBW/kg (Calculated) : 66.1  Vital Signs: Temp: 97.4 F (36.3 C) (03/20 2320) Temp src: Oral (03/20 2320) BP: 149/95 mmHg (03/20 2200) Pulse Rate: 67 (03/20 2352)  Labs:  Recent Labs  06/18/13 0300  06/19/13 1330 06/20/13 0344 06/20/13 1205  HGB 10.9*  --   --   --   --   HCT 35.3*  --   --   --   --   PLT 251  --   --   --   --   APTT 71*  --   --   --   --   LABPROT  --   < > 23.6* 21.0* 18.6*  INR  --   < > 2.18* 1.87* 1.60*  CREATININE 1.21  --   --   --   --   TROPONINI 0.69*  --   --   --   --   < > = values in this interval not displayed.  Estimated Creatinine Clearance: 63.7 ml/min (by C-G formula based on Cr of 1.21).   Medical History: Past Medical History  Diagnosis Date  . GERD (gastroesophageal reflux disease)   . COPD (chronic obstructive pulmonary disease)     a. On home oxygen  . Chronic combined systolic and diastolic CHF (congestive heart failure)   . Essential hypertension, benign   . Cardiomyopathy   . Right ventricular mural thrombus   . Cor pulmonale     a. Severe RV dysfunction;  b. 04/2013 R heart cath: RA 17, RV 70/18 (22), PA 73/52.  . Coronary atherosclerosis of native coronary artery     a. 05/2012 MI/PCI: DES to [facility];  b. 04/2013 Cath/PCI: LM nl, LAD nonobs, LCX patent mid stent, RCA dom, 14m (2.5x16 Promus DES).  Marland Kitchen PAF (paroxysmal atrial fibrillation)     a. on amiodarone  . Spontaneous pneumothorax 2002  . PAT (paroxysmal atrial tachycardia)   . Mixed hyperlipidemia   . History of pneumonia   . Type II diabetes mellitus   . CKD (chronic kidney disease) stage 2, GFR 60-89 ml/min     Medications:   Warfarin PTA: 2mg /day  Assessment: 58 y/o M whose warfarin has been on hold for several days who is now s/p cath on 3/20. INR is now 1.60 (2.29 on 3/17). Heparin is to start this AM at 0630 per previous note and warfarin will start tonight as well. Other labs as above.   Goal of Therapy:  INR 2-3 Monitor platelets by anticoagulation protocol: Yes   Plan:  -Warfarin 2mg  PO x 1 3/21 at 1800 -Daily PT/INR -Monitor for bleeding  Abran Duke 06/21/2013,12:06 AM

## 2013-06-21 NOTE — Progress Notes (Signed)
   ID: 58 year-old man with history of coronary artery disease status post PCI to the RCA in 04/2013, severe pulmonary hypertension and right heart failure, severe steroid and oxygen dependent COPD with previous bilateral bullectomy 2002 due to recurrent PTX, paroxysmal atrial fibrillation, prior RV thrombus on chronic anticoagulation, hypertension, and diabetes mellitus, who has been transferred to [facility] for further management of shortness of breath and elevated troponin.   Subjective:  Underwent cath 3/21. Coronaries clear with patent RCA stent. EF 45-50%  RA 7 RV 94/8 PA 94/46 (62) PCWP 10 LVEDP 5 Fick 4.8/3.1 Thermo 2.7/1.7 Pa sat 36%  PVR 21.1 (thermo)  11.9 (Fick)  PFTs form 03/07/13  FEV1 0.85 (29%) FVC  1.96  (53%) Ratio 43% FEF 25-75% 0.32L (11%) DLCO 27%  Started sildenafil. VQ and repeat PFTs ordered.   Gets hypoxic on 6L with any movement.     Objective: Vital signs in last 24 hours: Temp:  [97.4 F (36.3 C)-98.7 F (37.1 C)] 97.7 F (36.5 C) (03/21 0737) Pulse Rate:  [57-71] 70 (03/21 1000) Resp:  [16-26] 24 (03/21 1000) BP: (98-153)/(55-96) 98/55 mmHg (03/21 1000) SpO2:  [89 %-97 %] 95 % (03/21 1000) FiO2 (%):  [50 %-55 %] 50 % (03/21 0016) Weight:  [66.951 kg (147 lb 9.6 oz)] 66.951 kg (147 lb 9.6 oz) (03/21 0500) Last BM Date: 06/19/13  Intake/Output from previous day: 03/20 0701 - 03/21 0700 In: 1802.6 [P.O.:240; I.V.:1562.6] Out: 2125 [Urine:2125] Intake/Output this shift: Total I/O In: 279 [P.O.:240; I.V.:39] Out: 700 [Urine:700]  Medications: Reviewed on Epic.   PE: General appearance: fatigued appearing. alert, cooperative and no distress  Neck: JVP 10 Lungs: barrel chested  Markedly diminished breath sounds throughout. No wheeze Cor. PMi non palpable. RRR. +RV lift Abdomen: soft, non-tender; bowel sounds normal; no masses, HSM remains present but not as pulsatile  Extremities:  Tr-1+ edema. No clubbing  Pulses: 2+ and symmetric    Skin: warm and dry  Neurologic: Grossly normal   Lab Results:   Recent Labs  06/21/13 0325  WBC 8.3  HGB 11.2*  HCT 37.2*  PLT 206   BMET No results found for this basename: NA, K, CL, CO2, GLUCOSE, BUN, CREATININE, CALCIUM,  in the last 72 hours PT/INR  Recent Labs  06/20/13 0344 06/20/13 1205 06/21/13 0325  LABPROT 21.0* 18.6* 16.7*  INR 1.87* 1.60* 1.39   Cardiac Panel (last 3 results) No results found for this basename: CKTOTAL, CKMB, TROPONINI, RELINDX,  in the last 72 hours   Assessment/Plan  1. A/c respiratory failure  2. End-stage cor pulmonale 3. Severe PAH  4. Bullous emphysema 5. DM2 6. CAD  Mr. Tosi unfortunately has severe PAH and end-stage cor pulmonale in the setting of bullous emphysema. His oxygenation remains tenuous on 6L O2. Will need to stop sildenafil due to risk of shunting with severe lung disease. Unfortunately, only therapy for him would be heart lung transplant and I do not think he would be able to tolerate this. I discussed the situation with him and his family. Have recommended Hospice consult and they are amenable to this. Will continue supportive care for now.   Daniel Bensimhon,MD 12:40 PM

## 2013-06-22 DIAGNOSIS — Z515 Encounter for palliative care: Secondary | ICD-10-CM

## 2013-06-22 DIAGNOSIS — I219 Acute myocardial infarction, unspecified: Secondary | ICD-10-CM

## 2013-06-22 DIAGNOSIS — J449 Chronic obstructive pulmonary disease, unspecified: Secondary | ICD-10-CM

## 2013-06-22 DIAGNOSIS — J4489 Other specified chronic obstructive pulmonary disease: Secondary | ICD-10-CM

## 2013-06-22 LAB — BASIC METABOLIC PANEL
BUN: 28 mg/dL — ABNORMAL HIGH (ref 6–23)
CALCIUM: 8.8 mg/dL (ref 8.4–10.5)
CHLORIDE: 93 meq/L — AB (ref 96–112)
CO2: 36 mEq/L — ABNORMAL HIGH (ref 19–32)
CREATININE: 1.13 mg/dL (ref 0.50–1.35)
GFR calc Af Amer: 82 mL/min — ABNORMAL LOW (ref >90)
GFR calc non Af Amer: 71 mL/min — ABNORMAL LOW (ref >90)
Glucose, Bld: 202 mg/dL — ABNORMAL HIGH (ref 70–99)
Potassium: 4.4 mEq/L (ref 3.7–5.3)
Sodium: 140 mEq/L (ref 137–147)

## 2013-06-22 LAB — GLUCOSE, CAPILLARY
GLUCOSE-CAPILLARY: 136 mg/dL — AB (ref 70–99)
Glucose-Capillary: 181 mg/dL — ABNORMAL HIGH (ref 70–99)
Glucose-Capillary: 197 mg/dL — ABNORMAL HIGH (ref 70–99)
Glucose-Capillary: 118 mg/dL — ABNORMAL HIGH (ref 70–99)

## 2013-06-22 LAB — CBC
HEMATOCRIT: 34.3 % — AB (ref 39.0–52.0)
HEMOGLOBIN: 10.6 g/dL — AB (ref 13.0–17.0)
MCH: 28.6 pg (ref 26.0–34.0)
MCHC: 30.9 g/dL (ref 30.0–36.0)
MCV: 92.7 fL (ref 78.0–100.0)
Platelets: 186 10*3/uL (ref 150–400)
RBC: 3.7 MIL/uL — AB (ref 4.22–5.81)
RDW: 17.3 % — ABNORMAL HIGH (ref 11.5–15.5)
WBC: 6.5 10*3/uL (ref 4.0–10.5)

## 2013-06-22 LAB — PROTIME-INR
INR: 1.32 (ref 0.00–1.49)
PROTHROMBIN TIME: 16.1 s — AB (ref 11.6–15.2)

## 2013-06-22 LAB — HEPARIN LEVEL (UNFRACTIONATED)
HEPARIN UNFRACTIONATED: 0.25 [IU]/mL — AB (ref 0.30–0.70)
Heparin Unfractionated: 0.32 IU/mL (ref 0.30–0.70)

## 2013-06-22 MED ORDER — WARFARIN SODIUM 4 MG PO TABS
4.0000 mg | ORAL_TABLET | Freq: Once | ORAL | Status: AC
  Administered 2013-06-22: 4 mg via ORAL
  Filled 2013-06-22: qty 1

## 2013-06-22 MED ORDER — ALBUTEROL SULFATE (2.5 MG/3ML) 0.083% IN NEBU
2.5000 mg | INHALATION_SOLUTION | Freq: Three times a day (TID) | RESPIRATORY_TRACT | Status: DC
  Administered 2013-06-22 – 2013-06-23 (×4): 2.5 mg via RESPIRATORY_TRACT
  Filled 2013-06-22 (×4): qty 3

## 2013-06-22 MED ORDER — CLOPIDOGREL BISULFATE 75 MG PO TABS
75.0000 mg | ORAL_TABLET | Freq: Every day | ORAL | Status: DC
  Administered 2013-06-23: 75 mg via ORAL
  Filled 2013-06-22: qty 1

## 2013-06-22 NOTE — Consult Note (Signed)
Patient Danny [name]      DOB: [birth year]      KAJ:[id number]  Summary of Goals of Care; full note to follow:  Met with Danny [name] .  Both are coping as best they can with the news of Danny [name] decline.  We discussed any question they might have and reviewed things to focus on as they take their next steps.  [name] expressed that he desired full code status but did not want to live on machines initially when talking about advanced care planning but then as we reviewed the change in his health status he stated they would talk about it.  I encouraged him to ask Dr. Missy Sabins what he thought about the topic.  They are open to hospice care at home . At this time, symptoms reasonably managed- dyspnea responds to rest, no pain, bowels reasonable.   Recommend:  1.  Continue code status discussion with Dr. Missy Sabins, complete MOST form when decisions made  2.  Consult Care manager for home hospice.  Total time 1000-1030  Asencion Guisinger L. Lovena Le, MD MBA The Palliative Medicine Team at [facility] Phone: [phone] Pager: [phone]

## 2013-06-22 NOTE — Progress Notes (Signed)
Patient CN:Danny [name]      DOB: 06-15-1955      EZM:[id number]  PMT consult received will see as soon as possible.   Danny Ruehl L. Ladona Ridgel, MD MBA The Palliative Medicine Team at [facility] Phone: [phone] Pager: [phone]

## 2013-06-22 NOTE — Consult Note (Signed)
Patient Danny [name]      DOB: 16-Jul-1955      QHU:[id number]     Consult Note from the Palliative Medicine Team at [place] Requested by: Dr. Haroldine Laws     PCP: Maricela Curet, MD Reason for Consultation: [place]    Phone Number:[phone] Related symptoms recommendation Assessment of patients Current state: 58 yr old african [place] male admitted with worsening shortness of breath, hypoxia, worsening appetite.  He was recently hospitalized for decompensated heart failure and COPD exacerbation.  He was rehospitalized and found to have elevated troponin consistent with NSTEMI.  He was transferred to [facility] for Cath and found to have clear coronaries but worsening right sided heart failure.  Patient also has limitation related to previous bullectomy and severe pulmonary hypertension.  Patient is accepting that their are no further medical interventions for his combined condition.  I met with the patient and his wife.  They have already been talking about next steps.  He would like to discharge to home with hospice services.  He remains ambivalent about his code status and so we talked about his options and I encouraged him to talk with Dr. Missy Sabins about the helpfulness vs non helpfulness of full code status.     Goals of Care: 1.  Code Status: Full code at this time   2. Scope of Treatment: Maximize medical therapy for underlying pulmonary and cardiac conditions.   4. Disposition: Home with hospice   3. Symptom Management:   1. Dyspnea: patient wants to use rest and slow mobilization to deal with symptoms.  Ultimately Roxanol may be helpful. Hospice can assist with transition.  4. Psychosocial: Married with multiple children and grandchildren.  Patient has a strong faith and was raised by his grandmother, who instilled deep respect for life   5. Spiritual: Offered spiritual care services.       Patient Documents Completed or Given: Document Given Completed   Advanced Directives Pkt    MOST x   DNR    Gone from My Sight    Hard Choices      Brief HPI: 58 yr old male admitted from [facility] for Cardiac Cath. We were asked to assist with goals of care.   WSF:KCLEXNTZ at rest and with exertion, decreased appetite. Chest pain improved, no N,V,D    PMH:  Past Medical History  Diagnosis Date  . GERD (gastroesophageal reflux disease)   . COPD (chronic obstructive pulmonary disease)     a. On home oxygen  . Chronic combined systolic and diastolic CHF (congestive heart failure)   . Essential hypertension, benign   . Cardiomyopathy   . Right ventricular mural thrombus   . Cor pulmonale     a. Severe RV dysfunction;  b. 04/2013 R heart cath: RA 17, RV 70/18 (22), PA 73/52.  . Coronary atherosclerosis of native coronary artery     a. 05/2012 MI/PCI: DES to [facility];  b. 04/2013 Cath/PCI: LM nl, LAD nonobs, LCX patent mid stent, RCA dom, 78m (2.5x16 Promus DES).  Marland Kitchen PAF (paroxysmal atrial fibrillation)     a. on amiodarone  . Spontaneous pneumothorax 2002  . PAT (paroxysmal atrial tachycardia)   . Mixed hyperlipidemia   . History of pneumonia   . Type II diabetes mellitus   . CKD (chronic kidney disease) stage 2, GFR 60-89 ml/min      PSH: Past Surgical History  Procedure Laterality Date  . Lung removal, partial Bilateral  04/26/2000    Bullectomy  . Coronary angioplasty with stent placement  04/2012; 04/2013    Cyndi Bender, [place] ("1"); "1; total of 2 now" (05/16/2013)  . Pilonidal cyst excision  ~ 1990   I have reviewed the [place] and SH and  If appropriate update it with new information. Allergies  Allergen Reactions  . Lipitor [Atorvastatin] Other (See Comments)    Muscle spasms    Scheduled Meds: . albuterol  2.5 mg Nebulization QID  . amiodarone  200 mg Oral Daily  . arformoterol  15 mcg Nebulization BID  . aspirin EC  81 mg Oral Daily  . budesonide  0.25 mg Nebulization BID  . cloNIDine  0.1 mg Oral BID  . clotrimazole  10  mg Oral Daily  . digoxin  0.125 mg Oral q morning - 10a  . furosemide  40 mg Intravenous BID  . insulin aspart  0-15 Units Subcutaneous TID WC  . losartan  100 mg Oral Daily  . metoprolol  50 mg Oral BID  . pantoprazole  40 mg Oral Daily  . PARoxetine  20 mg Oral Daily  . prasugrel  10 mg Oral Daily  . predniSONE  10 mg Oral Daily  . rosuvastatin  10 mg Oral Daily  . sodium chloride  3 mL Intravenous Q12H  . tiotropium  18 mcg Inhalation Daily  . warfarin  4 mg Oral ONCE-1800  . Warfarin - Pharmacist Dosing Inpatient   Does not apply q1800   Continuous Infusions: . sodium chloride Stopped (06/21/13 0400)  . heparin 1,150 Units/hr (06/22/13 0527)   PRN Meds:.sodium chloride, albuterol, nitroGLYCERIN, sodium chloride, traMADol    BP 86/59  Pulse 69  Temp(Src) 98.6 F (37 C) (Oral)  Resp 19  Ht $R'5\' 7"'eu$  (1.702 m)  Wt 65 kg (143 lb 4.8 oz)  BMI 22.44 kg/m2  SpO2 98%   PPS: 30-40%   Intake/Output Summary (Last 24 hours) at 06/22/13 1114 Last data filed at 06/22/13 0700  Gross per 24 hour  Intake 1304.58 ml  Output   1375 ml  Net -70.42 ml    Physical Exam:  General:  Mild dyspnea at rest but exacerbated with exertion HEENT: PERRL, EOMI, anicteric, speech clear with full capacity Chest:   Decreased and distant, no wheezing CVS: regular, distant, S1, S2 Abdomen:soft, not tender not distended positive bowel sounds Ext: warm, no mottling, trace edema Neuro:awake, alert , oriented, CN II-XII intact  Labs: CBC    Component Value Date/Time   WBC 6.5 06/22/2013 0328   RBC 3.70* 06/22/2013 0328   HGB 10.6* 06/22/2013 0328   HCT 34.3* 06/22/2013 0328   PLT 186 06/22/2013 0328   MCV 92.7 06/22/2013 0328   MCH 28.6 06/22/2013 0328   MCHC 30.9 06/22/2013 0328   RDW 17.3* 06/22/2013 0328   LYMPHSABS 0.6* 06/18/2013 0300   MONOABS 0.1 06/18/2013 0300   EOSABS 0.0 06/18/2013 0300   BASOSABS 0.0 06/18/2013 0300     CMP     Component Value Date/Time   NA 142 06/18/2013 0300   K  4.6 06/18/2013 0300   CL 94* 06/18/2013 0300   CO2 33* 06/18/2013 0300   GLUCOSE 164* 06/18/2013 0300   BUN 32* 06/18/2013 0300   CREATININE 1.21 06/18/2013 0300   CREATININE 1.35 10/14/2012 1240   CALCIUM 9.1 06/18/2013 0300   PROT 6.0 06/18/2013 0300   ALBUMIN 3.1* 06/18/2013 0300   AST 19 06/18/2013 0300   ALT 30 06/18/2013 0300   ALKPHOS 67  06/18/2013 0300   BILITOT 0.8 06/18/2013 0300   GFRNONAA 65* 06/18/2013 0300   GFRAA 76* 06/18/2013 0300    Chest Xray Reviewed/Impressions: Atelectasis versus infiltrate, chronic scar versus small effusion,  the right costophrenic angle.  No further focal regions of consolidation or focal infiltrates.  Chronic interstitial changes.  Postsurgical changes left hemithorax      Time In Time Out Total Time Spent with Patient Total Overall Time  1000 am 1030 am 30 min 30 min    Greater than 50%  of this time was spent counseling and coordinating care related to the above assessment and plan. Ravleen Ries L. Lovena Le, MD MBA The Palliative Medicine Team at [facility] Phone: [phone] Pager: [phone]

## 2013-06-22 NOTE — Progress Notes (Addendum)
ID: 58 year-old man with history of coronary artery disease status post PCI to the RCA in 04/2013, severe pulmonary hypertension and right heart failure, severe steroid and oxygen dependent COPD with previous bilateral bullectomy 2002 due to recurrent PTX, paroxysmal atrial fibrillation, prior RV thrombus on chronic anticoagulation, hypertension, and diabetes mellitus, who has been [facility] for further management of shortness of breath and elevated troponin.   Subjective:  Underwent cath 3/21. Coronaries clear with patent RCA stent. EF 45-50%  RA 7 RV 94/8 PA 94/46 (62) PCWP 10 LVEDP 5 Fick 4.8/3.1 Thermo 2.7/1.7 Pa sat 36%  PVR 21.1 (thermo)  11.9 (Fick)  PFTs form 03/07/13  FEV1 0.85 (29%) FVC  1.96  (53%) Ratio 43% FEF 25-75% 0.32L (11%) DLCO 27%  Feels fatigued but no real change. Continues with hypoxia with any movement on 6L. Sildenafil stopped. Seen by Palliative Care team today and plan is for Home Hospice. Case Manager has been notified.    Objective: Vital signs in last 24 hours: Temp:  [97.6 F (36.4 C)-98.6 F (37 C)] 98.6 F (37 C) (03/22 0737) Pulse Rate:  [60-77] 69 (03/22 0737) Resp:  [15-24] 19 (03/22 0737) BP: (80-113)/(49-89) 86/59 mmHg (03/22 0737) SpO2:  [91 %-100 %] 98 % (03/22 0842) Weight:  [65 kg (143 lb 4.8 oz)] 65 kg (143 lb 4.8 oz) (03/22 0417) Last BM Date: 06/21/13  Intake/Output from previous day: 03/21 0701 - 03/22 0700 In: 1583.6 [P.O.:1080; I.V.:503.6] Out: 1875 [Urine:1875] Intake/Output this shift:    Medications: Reviewed on Epic.   PE: General appearance: fatigued appearing. alert, cooperative and no distress  Neck: JVP 10 Lungs: barrel chested  Markedly diminished breath sounds throughout. No wheeze Cor. PMi non palpable. RRR. +RV lift Abdomen: soft, non-tender; bowel sounds normal; no masses, HSM remains present but not as pulsatile  Extremities:  Tr-1+ edema. No clubbing  Pulses: 2+ and symmetric    Skin: warm and dry  Neurologic: Grossly normal   Lab Results:   Recent Labs  06/21/13 0325 06/22/13 0328  WBC 8.3 6.5  HGB 11.2* 10.6*  HCT 37.2* 34.3*  PLT 206 186   BMET No results found for this basename: NA, K, CL, CO2, GLUCOSE, BUN, CREATININE, CALCIUM,  in the last 72 hours PT/INR  Recent Labs  06/20/13 1205 06/21/13 0325 06/22/13 0328  LABPROT 18.6* 16.7* 16.1*  INR 1.60* 1.39 1.32   Cardiac Panel (last 3 results) No results found for this basename: CKTOTAL, CKMB, TROPONINI, RELINDX,  in the last 72 hours   Assessment/Plan  1. A/c respiratory failure  2. End-stage cor pulmonale 3. Severe PAH  4. Bullous emphysema 5. DM2 6. CAD 7. DNR/DNI  Mr. Carolin CoyRoach unfortunately has severe PAH and end-stage cor pulmonale in the setting of bullous emphysema. His oxygenation remains tenuous on 6L O2.  Sildenafil stopped due to risk of shunting with severe lung disease. Unfortunately, only therapy for him would be heart lung transplant and I do not think he would be able to tolerate this.  Diuresing well. Will check BMET. Continue IV lasix one more day.    Has RCA stent 1/15. With coumadin for RV thrombus will switch Brillinta to Plavix. No ASA.   He has been seen by Hospice team and they would like to pursue Home Hospice. Case Manager notified. I discussed Code Status with him and his family and we have decided on DNR/DNI. Pressors Ok if needed.   Continue heparin/coumadin for RV clot. Can d/c home prior  to therapeutic INR as this is chronic clot.   Likely d/c in am with Home Hospice.   Daniel Bensimhon,MD 11:21 AM

## 2013-06-22 NOTE — Progress Notes (Signed)
ANTICOAGULATION CONSULT NOTE - Follow Up Consult  Pharmacy Consult for Heparin and Coumadin Indication: chest pain/ACS and atrial fibrillation   Allergies  Allergen Reactions  . Lipitor [Atorvastatin] Other (See Comments)    Muscle spasms     Patient Measurements: Height: 5\' 7"  (170.2 cm) Weight: 143 lb 4.8 oz (65 kg) IBW/kg (Calculated) : 66.1 Heparin Dosing Weight: 67.4 kg  Vital Signs: Temp: 98.6 F (37 C) (03/22 0737) Temp src: Oral (03/22 0737) BP: 86/59 mmHg (03/22 0737) Pulse Rate: 69 (03/22 0737)  Labs:  Recent Labs  06/20/13 1205 06/21/13 0325 06/21/13 1338 06/21/13 2054 06/22/13 0328  HGB  --  11.2*  --   --  10.6*  HCT  --  37.2*  --   --  34.3*  PLT  --  206  --   --  186  LABPROT 18.6* 16.7*  --   --  16.1*  INR 1.60* 1.39  --   --  1.32  HEPARINUNFRC  --   --  0.14* 0.14*  --     Estimated Creatinine Clearance: 62.7 ml/min (by C-G formula based on Cr of 1.21).  Assessment: 58 yo M, s/p cardiac cath. Heparin drip resumed 12 hrs after sheath removal and Coumadin also resumed on 3/21.  INR decreased to 1.32 today and subtherapeutic.  Will increase coumadin dose today.  Heparin resumed at 0630 on 3/21, HL post 6 hrs is 0.14 and subtherapeutic.  On recheck HL at 2100 is still 0.14 and subtherapeutic.  However, the 2100 HL was not addressed by pharmacy.  Ordered a stat HL this AM and came back as 0.32, slightly therapeutic.  Will order another HL again in 6 hrs to confirm. H/h low stable, plt wnl, and no bleeding noted.  Goal of Therapy:  Heparin level 0.3-0.7 units/ml INR goal 2-3 Monitor platelets by anticoagulation protocol: Yes   Plan:  Continue heparin 1150 units/hr Recheck heparin a level at 1600 Increase coumadin to 4 mg Daily heparin level, CBC and PT/INR.  Anabel Bene, PharmD Clinical Pharmacist Pager: [phone]  Anabel Bene, RPh 06/22/2013,7:50 AM

## 2013-06-23 ENCOUNTER — Encounter: Payer: Self-pay | Admitting: Cardiovascular Disease

## 2013-06-23 ENCOUNTER — Encounter (HOSPITAL_COMMUNITY): Payer: Managed Care, Other (non HMO)

## 2013-06-23 LAB — BASIC METABOLIC PANEL
BUN: 24 mg/dL — AB (ref 6–23)
CALCIUM: 8.8 mg/dL (ref 8.4–10.5)
CO2: 36 mEq/L — ABNORMAL HIGH (ref 19–32)
Chloride: 96 mEq/L (ref 96–112)
Creatinine, Ser: 0.97 mg/dL (ref 0.50–1.35)
GFR calc Af Amer: >90 mL/min (ref >90)
GFR calc non Af Amer: >90 mL/min (ref >90)
GLUCOSE: 99 mg/dL (ref 70–99)
Potassium: 3.9 mEq/L (ref 3.7–5.3)
Sodium: 140 mEq/L (ref 137–147)

## 2013-06-23 LAB — CBC
HEMATOCRIT: 35.1 % — AB (ref 39.0–52.0)
Hemoglobin: 10.9 g/dL — ABNORMAL LOW (ref 13.0–17.0)
MCH: 28.7 pg (ref 26.0–34.0)
MCHC: 31.1 g/dL (ref 30.0–36.0)
MCV: 92.4 fL (ref 78.0–100.0)
Platelets: 207 10*3/uL (ref 150–400)
RBC: 3.8 MIL/uL — ABNORMAL LOW (ref 4.22–5.81)
RDW: 17.1 % — AB (ref 11.5–15.5)
WBC: 7.9 10*3/uL (ref 4.0–10.5)

## 2013-06-23 LAB — PROTIME-INR
INR: 1.25 (ref 0.00–1.49)
Prothrombin Time: 15.4 seconds — ABNORMAL HIGH (ref 11.6–15.2)

## 2013-06-23 LAB — GLUCOSE, CAPILLARY
GLUCOSE-CAPILLARY: 224 mg/dL — AB (ref 70–99)
Glucose-Capillary: 92 mg/dL (ref 70–99)
Glucose-Capillary: 98 mg/dL (ref 70–99)
Glucose-Capillary: 158 mg/dL — ABNORMAL HIGH (ref 70–99)

## 2013-06-23 LAB — HEPARIN LEVEL (UNFRACTIONATED): Heparin Unfractionated: 0.49 IU/mL (ref 0.30–0.70)

## 2013-06-23 MED ORDER — WARFARIN SODIUM 4 MG PO TABS
4.0000 mg | ORAL_TABLET | Freq: Once | ORAL | Status: AC
  Administered 2013-06-23: 4 mg via ORAL
  Filled 2013-06-23: qty 1

## 2013-06-23 MED ORDER — LOSARTAN POTASSIUM 50 MG PO TABS: 50.0000 mg | ORAL_TABLET | Freq: Every day | ORAL | Status: AC

## 2013-06-23 MED ORDER — CLOPIDOGREL BISULFATE 75 MG PO TABS: 75.0000 mg | ORAL_TABLET | Freq: Every day | ORAL | Status: DC

## 2013-06-23 MED ORDER — FUROSEMIDE 40 MG PO TABS: 40.0000 mg | ORAL_TABLET | Freq: Every day | ORAL | Status: AC

## 2013-06-23 MED ORDER — FLUCONAZOLE 150 MG PO TABS
150.0000 mg | ORAL_TABLET | Freq: Once | ORAL | Status: AC
  Administered 2013-06-23: 150 mg via ORAL
  Filled 2013-06-23: qty 1

## 2013-06-23 NOTE — Progress Notes (Signed)
ANTICOAGULATION CONSULT NOTE - Follow Up Consult  Pharmacy Consult for heparin Indication: Chest pain/ACS, atrial afib, prior RV thrombus  Allergies  Allergen Reactions  . Lipitor [Atorvastatin] Other (See Comments)    Muscle spasms     Patient Measurements: Height: 5\' 7"  (170.2 cm) Weight: 141 lb 1.5 oz (64 kg) IBW/kg (Calculated) : 66.1 Heparin Dosing Weight: 64 kg  Vital Signs: Temp: 97.8 F (36.6 C) (03/23 1054) Temp src: Oral (03/23 1054) BP: 86/61 mmHg (03/23 1600) Pulse Rate: 69 (03/23 1600)  Labs:  Recent Labs  06/21/13 0325  06/22/13 0328 06/22/13 0850 06/22/13 1600 06/23/13 0813  HGB 11.2*  --  10.6*  --   --  10.9*  HCT 37.2*  --  34.3*  --   --  35.1*  PLT 206  --  186  --   --  207  LABPROT 16.7*  --  16.1*  --   --  15.4*  INR 1.39  --  1.32  --   --  1.25  HEPARINUNFRC  --   < >  --  0.32 0.25* 0.49  CREATININE  --   --   --   --  1.13 0.97  < > = values in this interval not displayed.  Estimated Creatinine Clearance: 77 ml/min (by C-G formula based on Cr of 0.97).   Medications:  Scheduled:  . albuterol  2.5 mg Nebulization TID  . amiodarone  200 mg Oral Daily  . arformoterol  15 mcg Nebulization BID  . budesonide  0.25 mg Nebulization BID  . cloNIDine  0.1 mg Oral BID  . clopidogrel  75 mg Oral Q breakfast  . clotrimazole  10 mg Oral Daily  . digoxin  0.125 mg Oral q morning - 10a  . furosemide  40 mg Intravenous BID  . insulin aspart  0-15 Units Subcutaneous TID WC  . losartan  100 mg Oral Daily  . pantoprazole  40 mg Oral Daily  . PARoxetine  20 mg Oral Daily  . predniSONE  10 mg Oral Daily  . sodium chloride  3 mL Intravenous Q12H  . tiotropium  18 mcg Inhalation Daily  . warfarin  4 mg Oral ONCE-1800  . Warfarin - Pharmacist Dosing Inpatient   Does not apply q1800   Infusions:  . sodium chloride Stopped (06/21/13 0400)  . heparin Stopped (06/23/13 1631)    Assessment: 58 yo male with chest pain/ACS, atrial afib, prior RV  thrombus is currently on therapeutic heparin.  Heparin level is 0.45. Goal of Therapy:  Heparin level 0.3-0.7 units/ml Monitor platelets by anticoagulation protocol: Yes   Plan:  1) Continue heparin at 1300 units/hr 2) Daily heparin level and CBC  Orma Cheetham, Tsz-Yin 06/23/2013,4:31 PM

## 2013-06-23 NOTE — Progress Notes (Signed)
ANTICOAGULATION CONSULT NOTE - Follow Up Consult  Pharmacy Consult for Heparin and Coumadin Indication: Chest Pain/ACS, atrial fibrillation, prior RV thrombus  Allergies  Allergen Reactions  . Lipitor [Atorvastatin] Other (See Comments)    Muscle spasms     Patient Measurements: Height: 5\' 7"  (170.2 cm) Weight: 141 lb 1.5 oz (64 kg) IBW/kg (Calculated) : 66.1  Vital Signs: Temp: 98.2 F (36.8 C) (03/23 0808) Temp src: Oral (03/23 0808) BP: 117/82 mmHg (03/23 0808) Pulse Rate: 73 (03/23 0808)  Labs:  Recent Labs  06/21/13 0325  06/22/13 0328 06/22/13 0850 06/22/13 1600 06/23/13 0813  HGB 11.2*  --  10.6*  --   --  10.9*  HCT 37.2*  --  34.3*  --   --  35.1*  PLT 206  --  186  --   --  207  LABPROT 16.7*  --  16.1*  --   --  15.4*  INR 1.39  --  1.32  --   --  1.25  HEPARINUNFRC  --   < >  --  0.32 0.25* 0.49  CREATININE  --   --   --   --  1.13 0.97  < > = values in this interval not displayed.  Estimated Creatinine Clearance: 77 ml/min (by C-G formula based on Cr of 0.97).   Medications:  Scheduled:  . albuterol  2.5 mg Nebulization TID  . amiodarone  200 mg Oral Daily  . arformoterol  15 mcg Nebulization BID  . budesonide  0.25 mg Nebulization BID  . cloNIDine  0.1 mg Oral BID  . clopidogrel  75 mg Oral Q breakfast  . clotrimazole  10 mg Oral Daily  . digoxin  0.125 mg Oral q morning - 10a  . fluconazole  150 mg Oral Once  . furosemide  40 mg Intravenous BID  . insulin aspart  0-15 Units Subcutaneous TID WC  . losartan  100 mg Oral Daily  . pantoprazole  40 mg Oral Daily  . PARoxetine  20 mg Oral Daily  . predniSONE  10 mg Oral Daily  . sodium chloride  3 mL Intravenous Q12H  . tiotropium  18 mcg Inhalation Daily  . warfarin  4 mg Oral ONCE-1800  . Warfarin - Pharmacist Dosing Inpatient   Does not apply q1800    Assessment: 58 yo male s/p cardiac cath for NSTEMI. Cath 3/21 showed coronaries clear with patent RCA stent and EF 45-50%. Pharmacy has  been consulted to dose IV heparin and Coumadin for CP/ACS, atrial fibrillation, and prior RV thrombus. Heparin level is therapeutic today at 0.49, but INR is subtherapeutic at 1.25. Coumadin was previously on hold for cath, but resumed on 3/21. Hg 10.9, platelets 207. No s/s of bleeding   PTA: Coumadin 2mg  daily  Goal of Therapy:  INR 2-3 Heparin level 0.3-0.7 units/ml Monitor platelets by anticoagulation protocol: Yes   Plan:  - Continue IV heparin 1300 units/hr - Obtain HL 1600 to verify - Warfarin 4mg  PO x 1 - Monitor daily HL, CBC, INR, and s/s bleeding  Christiane Ha A. Lenon Ahmadi, PharmD Clinical Pharmacist - Resident Pager: [phone] Pharmacy: [phone] 06/23/2013 12:31 PM

## 2013-06-23 NOTE — Progress Notes (Signed)
Pt education complete with wife at bedside; pt & family have no further questions at this time; pt aware of when to call MD & 911; pt belongings including cell phone returned to pt; emotional support given to pt & to family; pt wheeled out by nurse tech;

## 2013-06-23 NOTE — Discharge Summary (Signed)
CARDIOLOGY DISCHARGE SUMMARY   Patient ID: Danny [name] MRN: [id number] DOB/AGE: 09-14-1955 58 y.o.  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 male with a history of CADand biventricular CHF. He had been at [facility] 2/23 through 3/11 for respiratory failure secondary to COPD, right heart CHF and pneumonia. Despite home O2, he was having problems with desaturation just with standing. He was also having chest tightness. He went to [facility] emergency room on 3/17 and was treated for COPD exacerbation with Solu-Medrol as well as volume overload with IV Lasix. His cardiac enzymes elevated and he was placed on heparin and given aspirin. He was transferred to [facility] for further evaluation and treatment.  He continued to have elevation in his cardiac enzymes. He was diuresed with IV Lasix and was on nebulizers. His condition gradually improved. On 3/20, his condition was stable enough for him to be taken to the cath lab.  Right/left heart catheterization results are below. He had no critical stenosis on his coronary angiogram but had severe pulmonary hypertension. His Lasix was increased to twice a day. The results of his cardiac catheterization were reviewed by Dr. Tresa Endo, Dr. Gala Romney and Dr. Shirlee Latch. 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 He had been started on sildenafil but this was discontinued due to the  risk of shunting with severe lung disease. He was not felt a candidate for heart/lung transplant. The situation was discussed with the patient and his family. A hospice consult was called.  He was seen by Dr. Ladona Ridgel with hospice and agreed to home hospice care. Initially, he wished to remain a full code. However, after further discussion, pressors are okay but no other interventions including CPR and intubation are to be pursued.  He has a history of an RV clot. He is on Coumadin for this and his INR was therapeutic on admission. However, his Coumadin had been held for the heart catheterization and he is currently subtherapeutic. He was started on heparin after the cath but because the clot is chronic, he does not need full cross coverage with heparin or Lovenox to a therapeutic Coumadin level. He had been on Effient prior to admission because of his history of an RCA stent. However, because of the Coumadin, this was changed to Plavix.   On 3/23, Danny [name] was seen by Dr. Royann Shivers. No further inpatient workup was indicated once arrangements for home hospice and home care were made. Danny [name] is considered stable for discharge, to followup as an outpatient.  Labs:  Lab Results  Component Value Date   WBC 7.9 06/23/2013   HGB 10.9* 06/23/2013   HCT 35.1* 06/23/2013   MCV 92.4 06/23/2013   PLT 207 06/23/2013    Recent Labs Lab 06/18/13 0300  06/23/13 0813  NA 142  < > 140  K 4.6  < > 3.9  CL 94*  < > 96  CO2 33*  < > 36*  BUN 32*  < >  24*  CREATININE 1.21  < > 0.97  CALCIUM 9.1  < > 8.8  PROT 6.0  --   --   BILITOT 0.8  --   --   ALKPHOS 67  --   --   ALT 30  --   --   AST 19  --   --   GLUCOSE 164*  < > 99  < > = values in this interval not displayed. Lab Results  Component Value Date   TROPONINI 0.69* 06/18/2013   Lipid Panel     Component Value Date/Time   CHOL 115 04/20/2013 0501   TRIG 119 04/20/2013 0501   HDL 32* 04/20/2013 0501   CHOLHDL 3.6 04/20/2013 0501   VLDL 24  04/20/2013 0501   LDLCALC 59 04/20/2013 0501    Pro B Natriuretic peptide (BNP)  Date/Time Value Ref Range Status  05/26/2013  2:45 PM 10565.0* 0 - 125 pg/mL Final  05/15/2013 12:01 AM 9941.0* 0 - 125 pg/mL Final    Recent Labs  06/23/13 0813  INR 1.25   HIV  Date Value Ref Range Status  06/21/2013 NON REACTIVE  NON REACTIVE Final   Radiology:  Dg Chest Port 1 View 06/21/2013   CLINICAL DATA:  Pre-VQ scan  EXAM: PORTABLE CHEST - 1 VIEW  COMPARISON:  DG CHEST 1V PORT dated 06/17/2013  FINDINGS: Partial pneumonectomy changes left hemithorax. No focal regions of consolidation. Increased density projects within the right costophrenic angle, and mild blunting. The cardiac silhouette is moderately enlarged. Atherosclerotic calcifications identified within aorta. There is mild prominence of the interstitial markings. The osseous structures are unremarkable.  IMPRESSION: Atelectasis versus infiltrate, chronic scar versus small effusion, the right costophrenic angle.  No further focal regions of consolidation or focal infiltrates.  Chronic interstitial changes.  Postsurgical changes left hemithorax   Electronically Signed   By: Salome HolmesHector  Cooper M.D.   On: 06/21/2013 07:59   Dg Chest Portable 1 View 06/17/2013   CLINICAL DATA:  Respiratory distress, COPD, CHF  EXAM: PORTABLE CHEST - 1 VIEW  COMPARISON:  DG CHEST 2 VIEW dated 06/10/2013; CT CHEST W/CM dated 05/27/2013; DG CHEST 2 VIEW dated 06/03/2013; CT ANGIO CHEST W/CM &/OR WO/CM dated 10/23/2012; DG CHEST 1V PORT dated 10/23/2012  FINDINGS: Partial left pneumonectomy. Bilateral emphysematous changes. 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 Bibasilar scarring again noted. Mild bilateral interstitial thickening likely chronic. No focal consolidation, pleural effusion or pneumothorax. Stable cardiomediastinal silhouette.  The osseous structures are unremarkable.  IMPRESSION: No active cardiopulmonary disease.   Electronically Signed   By: Elige KoHetal  Patel   On: 06/17/2013  21:47    Cardiac Cath: 06/20/2013 HEMODYNAMICS:  RA: 14/9 mean 7  RV: 94/8  PA: 94/46  Simultaneous PA 94/41 and Ao: 119/77  Simultaneous LV: 115/10 and Pc: mean 10  Pullback LV: 107/5 and Ao: 106/72  Oxygen saturation: Ao: 83%  PA: 36%  RV: 43%  RA: 50%  IVC: 39%  Cardiac output: 3.1(Thermodilution); 4.8 (Fick)  Cardiac index: 1.7 2.7  ANGIOGRAPHY:  Left main coronary artery was angiographically normal and bifurcated into the LAD and left circumflex vessel.  The LAD was angiographically normal it gave rise to several septal perforating arteries and diagonal vessels.  The left circumflex vessel is angiographically normal and gave rise to one major marginal branch.  The RCA was angiographically normal and had a widely patent stent in its mid segment.  Hand injection left ventriculography was done with suboptimal visualization but the ejection fraction appeared in  the range of 45-50%. There was subtle mild residual mid inferior hypocontractility.  IMPRESSION:  Very severe pulmonary hypertension with a PA systolic pressure of 94 mm  Significant oxygen desaturation  Mild LV dysfunction  Widely patent mid RCA stent and otherwise normal coronary arteries.  RECOMMENDATION:  The patient has a history of severe right-sided dysfunction with significant oxygen desaturation and has significant progression of his pulmonary hypertension. The catheterization findings were discussed with Dr. Herbie Baltimore who also discussed them with Dr. Shirlee Latch. Patient will be scheduled for PFTs. He will also undergo a VQ scan to assess for potential chronic pulmonary emboli and coumadin will be reinstituted. He will be started on Revatio 20 mg tid for initial treatment of his pulmonary hypertension and HIV screen will be obtained.   EKG: 18-Jun-2013 07:05:30   Normal sinus rhythm Right ventricular hypertrophy Inferior infarct , age undetermined Possible Posterior infarct , old No significant change since last  tracing Vent. rate 76 BPM PR interval 188 ms QRS duration 106 ms QT/QTc 392/441 ms P-R-T axes 65 150 -32   FOLLOW UP PLANS AND APPOINTMENTS Allergies  Allergen Reactions  . Lipitor [Atorvastatin] Other (See Comments)    Muscle spasms      Medication List    STOP taking these medications       metoprolol 50 MG tablet  Commonly known as:  LOPRESSOR     prasugrel 10 MG Tabs tablet  Commonly known as:  EFFIENT     ramipril 2.5 MG capsule  Commonly known as:  ALTACE      TAKE these medications       Aclidinium Bromide 400 MCG/ACT Aepb  Commonly known as:  TUDORZA PRESSAIR  Inhale 1 puff into the lungs 2 (two) times daily.     albuterol (2.5 MG/3ML) 0.083% nebulizer solution  Commonly known as:  PROVENTIL  Take 3 mLs (2.5 mg total) by nebulization every 2 (two) hours as needed for wheezing.     albuterol 108 (90 BASE) MCG/ACT inhaler  Commonly known as:  PROVENTIL HFA;VENTOLIN HFA  Inhale 2 puffs into the lungs every 6 (six) hours as needed for wheezing or shortness of breath.     amiodarone 200 MG tablet  Commonly known as:  PACERONE  Take 1 tablet (200 mg total) by mouth daily.     arformoterol 15 MCG/2ML Nebu  Commonly known as:  BROVANA  Take 15 mcg by nebulization 2 (two) times daily.     budesonide 0.25 MG/2ML nebulizer solution  Commonly known as:  PULMICORT  Take 0.25 mg by nebulization 2 (two) times daily.     cloNIDine 0.1 MG tablet  Commonly known as:  CATAPRES  Take 1 tablet (0.1 mg total) by mouth 2 (two) times daily.     clopidogrel 75 MG tablet  Commonly known as:  PLAVIX  Take 1 tablet (75 mg total) by mouth daily with breakfast.     clotrimazole 10 MG troche  Commonly known as:  MYCELEX  Take 10 mg by mouth daily.     digoxin 0.125 MG tablet  Commonly known as:  LANOXIN  Take 0.125 mg by mouth every morning.     guaiFENesin-dextromethorphan 100-10 MG/5ML syrup  Commonly known as:  ROBITUSSIN DM  Take 5 mLs by mouth every 4 (four)  hours as needed for cough.     linagliptin 5 MG Tabs tablet  Commonly known as:  TRADJENTA  Take 5 mg by mouth daily.     losartan 50 MG tablet  Commonly known as:  COZAAR  Take 1 tablet (50 mg total) by mouth daily.     nitroGLYCERIN 0.4 MG SL tablet  Commonly known as:  NITROSTAT  Place 1 tablet (0.4 mg total) under the tongue every 5 (five) minutes x 3 doses as needed for chest pain.     omeprazole 20 MG capsule  Commonly known as:  PRILOSEC  Take 20 mg by mouth 2 (two) times daily.     PARoxetine 20 MG tablet  Commonly known as:  PAXIL  Take 1 tablet (20 mg total) by mouth daily.     predniSONE 10 MG tablet  Commonly known as:  DELTASONE  Take 20 mg by mouth daily. 20mg  a day for 2 more days, then resume your 10mg  a day.     rosuvastatin 10 MG tablet  Commonly known as:  CRESTOR  Take 10 mg by mouth every evening.     sodium chloride 0.65 % nasal spray  Commonly known as:  OCEAN  Place 1 spray into the nose every 6 (six) hours as needed for congestion.     traMADol 50 MG tablet  Commonly known as:  ULTRAM  Take 50 mg by mouth every 6 (six) hours as needed. For pain     warfarin 2 MG tablet  Commonly known as:  COUMADIN  Take 1 tablet (2 mg total) by mouth daily at 6 PM.        Discharge Orders   Future Appointments Provider Department Dept Phone   06/24/2013 10:45 AM Leslye Peer, MD [facility] [phone]   07/09/2013 10:40 AM Jonelle Sidle, MD [facility] [place] [phone]   Future Orders Complete By Expires   Diet - low sodium heart healthy  As directed    Diet Carb Modified  As directed    Increase activity slowly  As directed      Follow-up Information   Follow up with [facility].   Contact information:   [phone] vis nse      Follow up with Nona Dell, MD On 07/09/2013. (at 10:40 am)    Specialty:  Cardiology   Contact information:   [address] [place] [address] [phone]       BRING  ALL MEDICATIONS WITH YOU TO FOLLOW UP APPOINTMENTS  Time spent with patient to include physician time: 48 min Signed: Theodore Demark, PA-C 06/23/2013, 4:13 PM Co-Sign MD

## 2013-06-23 NOTE — Progress Notes (Signed)
Patient Name: Danny [name] Date of Encounter: 06/23/2013  Principal Problem:   Right heart failure Active Problems:   COPD exacerbation   Respiratory failure   NSTEMI (non-ST elevated myocardial infarction)   Length of Stay: 6  SUBJECTIVE  Breathing at baseline. No edema.  CURRENT MEDS . albuterol  2.5 mg Nebulization TID  . amiodarone  200 mg Oral Daily  . arformoterol  15 mcg Nebulization BID  . budesonide  0.25 mg Nebulization BID  . cloNIDine  0.1 mg Oral BID  . clopidogrel  75 mg Oral Q breakfast  . clotrimazole  10 mg Oral Daily  . digoxin  0.125 mg Oral q morning - 10a  . furosemide  40 mg Intravenous BID  . insulin aspart  0-15 Units Subcutaneous TID WC  . losartan  100 mg Oral Daily  . pantoprazole  40 mg Oral Daily  . PARoxetine  20 mg Oral Daily  . predniSONE  10 mg Oral Daily  . sodium chloride  3 mL Intravenous Q12H  . tiotropium  18 mcg Inhalation Daily  . warfarin  4 mg Oral ONCE-1800  . Warfarin - Pharmacist Dosing Inpatient   Does not apply q1800    OBJECTIVE   Intake/Output Summary (Last 24 hours) at 06/23/13 1016 Last data filed at 06/23/13 0900  Gross per 24 hour  Intake 611.72 ml  Output   1075 ml  Net -463.28 ml   Filed Weights   06/21/13 0500 06/22/13 0417 06/23/13 0320  Weight: 66.951 kg (147 lb 9.6 oz) 65 kg (143 lb 4.8 oz) 64 kg (141 lb 1.5 oz)    PHYSICAL EXAM Filed Vitals:   06/23/13 0358 06/23/13 0628 06/23/13 0759 06/23/13 0808  BP: 110/76   117/82  Pulse: 65 69  73  Temp:    98.2 F (36.8 C)  TempSrc:    Oral  Resp: 32 24  19  Height:      Weight:      SpO2: 100% 92% 94% 96%   General: Alert, oriented x3, no distress Head: no evidence of trauma, PERRL, EOMI, no exophtalmos or lid lag, no myxedema, no xanthelasma; normal ears, nose and oropharynx Neck: normal jugular venous pulsations and no hepatojugular reflux; brisk carotid pulses without delay and no carotid bruits Chest: clear to auscultation, no signs of  consolidation by percussion or palpation, normal fremitus, symmetrical and full respiratory excursions Cardiovascular: normal position and quality of the apical impulse, +ve RV lift, regular rhythm, normal first and second heart sounds, no rubs or gallops, no murmur Abdomen: no tenderness or distention, no masses by palpation, no abnormal pulsatility or arterial bruits, normal bowel sounds, no hepatosplenomegaly Extremities: no clubbing, cyanosis or edema; 2+ radial, ulnar and brachial pulses bilaterally; 2+ right femoral, posterior tibial and dorsalis pedis pulses; 2+ left femoral, posterior tibial and dorsalis pedis pulses; no subclavian or femoral bruits Neurological: grossly nonfocal  LABS  CBC  Recent Labs  06/22/13 0328 06/23/13 0813  WBC 6.5 7.9  HGB 10.6* 10.9*  HCT 34.3* 35.1*  MCV 92.7 92.4  PLT 186 207   Basic Metabolic Panel  Recent Labs  06/22/13 1600 06/23/13 0813  NA 140 140  K 4.4 3.9  CL 93* 96  CO2 36* 36*  GLUCOSE 202* 99  BUN 28* 24*  CREATININE 1.13 0.97  CALCIUM 8.8 8.8   Liver Function Tests No results found for this basename: AST, ALT, ALKPHOS, BILITOT, PROT, ALBUMIN,  in the last 72 hours No results found  for this basename: LIPASE, AMYLASE,  in the last 72 hours Cardiac Enzymes No results found for this basename: CKTOTAL, CKMB, CKMBINDEX, TROPONINI,  in the last 72 hours BNP No components found with this basename: POCBNP,  D-Dimer No results found for this basename: DDIMER,  in the last 72 hours Hemoglobin A1C No results found for this basename: HGBA1C,  in the last 72 hours Fasting Lipid Panel No results found for this basename: CHOL, HDL, LDLCALC, TRIG, CHOLHDL, LDLDIRECT,  in the last 72 hours Thyroid Function Tests No results found for this basename: TSH, T4TOTAL, FREET3, T3FREE, THYROIDAB,  in the last 72 hours  Radiology Studies Imaging results have been reviewed and No results found.  TELE NSR  ECG NSR, RVH, RAD  ASSESSMENT  AND PLAN DC home today with hospice care. He seems to be truly at peace with end of life decisions. INR 1.25  - will ask hospice agency to assist with INR monitoring.  If his clinical condition deteriorates further, he may choose to DC anticoagulation in the future.   Thurmon Fair, MD, [facility] [phone] office [phone] pager 06/23/2013 10:16 AM

## 2013-06-23 NOTE — Discharge Instructions (Signed)
PLEASE REMEMBER TO BRING ALL OF YOUR MEDICATIONS TO EACH OF YOUR FOLLOW-UP OFFICE VISITS. ° °PLEASE ATTEND ALL SCHEDULED FOLLOW-UP APPOINTMENTS.  ° °Activity: Increase activity slowly as tolerated. You may shower, but no soaking baths (or swimming) for 1 week. No driving for 2 days. No lifting over 5 lbs for 1 week. No sexual activity for 1 week.  ° °You May Return to Work: in 1 week (if applicable) ° °Wound Care: You may wash cath site gently with soap and water. Keep cath site clean and dry. If you notice pain, swelling, bleeding or pus at your cath site, please call [phone]. ° ° ° °Cardiac Cath Site Care °Refer to this sheet in the next few weeks. These instructions provide you with information on caring for yourself after your procedure. Your caregiver may also give you more specific instructions. Your treatment has been planned according to current medical practices, but problems sometimes occur. Call your caregiver if you have any problems or questions after your procedure. °HOME CARE INSTRUCTIONS °· You may shower 24 hours after the procedure. Remove the bandage (dressing) and gently wash the site with plain soap and water. Gently pat the site dry.  °· Do not apply powder or lotion to the site.  °· Do not sit in a bathtub, swimming pool, or whirlpool for 5 to 7 days.  °· No bending, squatting, or lifting anything over 10 pounds (4.5 kg) as directed by your caregiver.  °· Inspect the site at least twice daily.  °· Do not drive home if you are discharged the same day of the procedure. Have someone else drive you.  °· You may drive 24 hours after the procedure unless otherwise instructed by your caregiver.  °What to expect: °· Any bruising will usually fade within 1 to 2 weeks.  °· Blood that collects in the tissue (hematoma) may be painful to the touch. It should usually decrease in size and tenderness within 1 to 2 weeks.  °SEEK IMMEDIATE MEDICAL CARE IF: °· You have unusual pain at the site or down the  affected limb.  °· You have redness, warmth, swelling, or pain at the site.  °· You have drainage (other than a small amount of blood on the dressing).  °· You have chills.  °· You have a fever or persistent symptoms for more than 72 hours.  °· You have a fever and your symptoms suddenly get worse.  °· Your leg becomes pale, cool, tingly, or numb.  °· You have heavy bleeding from the site. Hold pressure on the site.  °Document Released: 04/22/2010 Document Revised: 03/09/2011 Document Reviewed: 04/22/2010 °[employer]® Patient Information ©2012 [employer]. ° °

## 2013-06-24 ENCOUNTER — Ambulatory Visit: Payer: Managed Care, Other (non HMO) | Admitting: Emergency Medicine

## 2013-06-24 ENCOUNTER — Other Ambulatory Visit (HOSPITAL_COMMUNITY): Payer: Self-pay | Admitting: Physician Assistant

## 2013-06-25 ENCOUNTER — Encounter (HOSPITAL_COMMUNITY): Payer: Managed Care, Other (non HMO)

## 2013-06-27 ENCOUNTER — Encounter (HOSPITAL_COMMUNITY): Payer: Managed Care, Other (non HMO)

## 2013-06-27 ENCOUNTER — Telehealth: Payer: Self-pay | Admitting: Cardiology

## 2013-06-27 NOTE — Telephone Encounter (Signed)
Spoke with Ron.  Family member will bring pt in for finger stick on Monday 3/30.

## 2013-06-27 NOTE — Telephone Encounter (Signed)
Hi [name]  Don't you follow Danny [name] ?

## 2013-06-30 ENCOUNTER — Ambulatory Visit (INDEPENDENT_AMBULATORY_CARE_PROVIDER_SITE_OTHER): Payer: Managed Care, Other (non HMO) | Admitting: *Deleted

## 2013-06-30 ENCOUNTER — Encounter (HOSPITAL_COMMUNITY): Payer: Managed Care, Other (non HMO)

## 2013-06-30 DIAGNOSIS — I219 Acute myocardial infarction, unspecified: Secondary | ICD-10-CM

## 2013-06-30 DIAGNOSIS — I513 Intracardiac thrombosis, not elsewhere classified: Secondary | ICD-10-CM

## 2013-06-30 DIAGNOSIS — Z7901 Long term (current) use of anticoagulants: Secondary | ICD-10-CM

## 2013-06-30 DIAGNOSIS — Z5181 Encounter for therapeutic drug level monitoring: Secondary | ICD-10-CM

## 2013-06-30 LAB — POCT INR: INR: 3.4

## 2013-07-02 ENCOUNTER — Encounter (HOSPITAL_COMMUNITY): Payer: Managed Care, Other (non HMO)

## 2013-07-04 ENCOUNTER — Encounter (HOSPITAL_COMMUNITY): Payer: Managed Care, Other (non HMO)

## 2013-07-07 ENCOUNTER — Encounter (HOSPITAL_COMMUNITY): Payer: Managed Care, Other (non HMO)

## 2013-07-09 ENCOUNTER — Ambulatory Visit (INDEPENDENT_AMBULATORY_CARE_PROVIDER_SITE_OTHER): Payer: Managed Care, Other (non HMO) | Admitting: *Deleted

## 2013-07-09 ENCOUNTER — Encounter (HOSPITAL_COMMUNITY): Payer: Managed Care, Other (non HMO)

## 2013-07-09 ENCOUNTER — Encounter: Payer: Self-pay | Admitting: Cardiology

## 2013-07-09 ENCOUNTER — Ambulatory Visit (INDEPENDENT_AMBULATORY_CARE_PROVIDER_SITE_OTHER): Payer: Managed Care, Other (non HMO) | Admitting: Cardiology

## 2013-07-09 VITALS — BP 112/63 | HR 71 | Ht 68.0 in | Wt 141.0 lb

## 2013-07-09 DIAGNOSIS — I513 Intracardiac thrombosis, not elsewhere classified: Secondary | ICD-10-CM

## 2013-07-09 DIAGNOSIS — Z7901 Long term (current) use of anticoagulants: Secondary | ICD-10-CM

## 2013-07-09 DIAGNOSIS — I4891 Unspecified atrial fibrillation: Secondary | ICD-10-CM

## 2013-07-09 DIAGNOSIS — J9690 Respiratory failure, unspecified, unspecified whether with hypoxia or hypercapnia: Secondary | ICD-10-CM

## 2013-07-09 DIAGNOSIS — J9600 Acute respiratory failure, unspecified whether with hypoxia or hypercapnia: Secondary | ICD-10-CM

## 2013-07-09 DIAGNOSIS — I2510 Atherosclerotic heart disease of native coronary artery without angina pectoris: Secondary | ICD-10-CM

## 2013-07-09 DIAGNOSIS — I219 Acute myocardial infarction, unspecified: Secondary | ICD-10-CM

## 2013-07-09 DIAGNOSIS — Z5181 Encounter for therapeutic drug level monitoring: Secondary | ICD-10-CM

## 2013-07-09 LAB — POCT INR: INR: 1.6

## 2013-07-09 NOTE — Assessment & Plan Note (Signed)
Continue medical therapy, recent cardiac catheterization showing stable coronary anatomy, most recent intervention being DES to the RCA in January of this year. Continues on Plavix, and concurrently on Coumadin.

## 2013-07-09 NOTE — Assessment & Plan Note (Signed)
After above discussion, plan to continue Coumadin for now.

## 2013-07-09 NOTE — Assessment & Plan Note (Signed)
Associated with markedly pulmonary arterial hypertension, cor pulmonale, COPD. Not candidate for lung/heart transplantation after evaluation at [facility], recent PASP 94 mmHg right heart catheterization, limited options for treatment. Currently in Hospice.

## 2013-07-09 NOTE — Progress Notes (Signed)
Clinical Summary Danny [name] is a medically complex 58 y.o.male last seen in February. He was recently admitted to the hospital in March with persistent respiratory failure, volume overload, and abnormal cardiac markers. He was transferred to [facility] for further management. He underwent right and left heart catheterization demonstrating stable coronary anatomy, although evidence of marked pulmonary arterial hypertension (PASP 94 mmHg) in the setting of cor pulmonale and severe emphysema. He already been evaluated for a heart/lung transplant at [facility] and turned down. Patient was evaluated by the advanced heart failure service, medical therapy adjusted, Hospice consultation was ultimately obtained.  He is here with his wife today, appears more chronically ill but not in any distress wearing oxygen. We discussed his recent hospitalization, he does have home nursing through Hospice each week. We had a long, frank discussion about his health decline, and he seems realistic about his poor prognosis. He has a DNR order. States that he and his wife have had several discussions about this difficult transition, trying to make plans. He has had visits from many friends however.  Recent lab work showed potassium 3.9, BUN 24, creatinine 0.9, hemoglobin 10.9, platelets 207.  We did.talk about his Coumadin. Apparently this is difficult for Hospice nursing to check. Stopping it altogether would certainly increase his thromboembolic risk with cor pulmonale and documentation of RV thrombus - worst case scenario being a terminal pulmonary embolus. Alternatively NOACs have not been studied in this setting, and Plavix would not be sufficient. After our discussion today, he has elected to continue Coumadin as long as we feasibly can.   Allergies  Allergen Reactions  . Lipitor [Atorvastatin] Other (See Comments)    Muscle spasms     Current Outpatient Prescriptions  Medication Sig Dispense Refill  .  Aclidinium Bromide (TUDORZA PRESSAIR) 400 MCG/ACT AEPB Inhale 1 puff into the lungs 2 (two) times daily.  1 each  3  . albuterol (PROVENTIL HFA;VENTOLIN HFA) 108 (90 BASE) MCG/ACT inhaler Inhale 2 puffs into the lungs every 6 (six) hours as needed for wheezing or shortness of breath.       Marland Kitchen albuterol (PROVENTIL) (2.5 MG/3ML) 0.083% nebulizer solution Take 3 mLs (2.5 mg total) by nebulization every 2 (two) hours as needed for wheezing.  75 mL  12  . amiodarone (PACERONE) 200 MG tablet Take 1 tablet (200 mg total) by mouth daily.  30 tablet  6  . arformoterol (BROVANA) 15 MCG/2ML NEBU Take 15 mcg by nebulization 2 (two) times daily.      . B-D ULTRAFINE III SHORT PEN 31G X 8 MM MISC       . budesonide (PULMICORT) 0.25 MG/2ML nebulizer solution Take 0.25 mg by nebulization 2 (two) times daily.      . cloNIDine (CATAPRES) 0.1 MG tablet Take 1 tablet (0.1 mg total) by mouth 2 (two) times daily.      . clopidogrel (PLAVIX) 75 MG tablet TAKE 1 TABLET BY MOUTH EVERY DAY WITH BREAKFAST  30 tablet  11  . digoxin (LANOXIN) 0.125 MG tablet Take 0.125 mg by mouth every morning.      . furosemide (LASIX) 40 MG tablet Take 1 tablet (40 mg total) by mouth daily.  30 tablet  11  . guaiFENesin-dextromethorphan (ROBITUSSIN DM) 100-10 MG/5ML syrup Take 5 mLs by mouth every 4 (four) hours as needed for cough.  118 mL  0  . LANTUS SOLOSTAR 100 UNIT/ML Solostar Pen       . losartan (COZAAR) 50 MG  tablet Take 1 tablet (50 mg total) by mouth daily.  30 tablet  6  . nitroGLYCERIN (NITROSTAT) 0.4 MG SL tablet Place 1 tablet (0.4 mg total) under the tongue every 5 (five) minutes x 3 doses as needed for chest pain.  25 tablet  3  . omeprazole (PRILOSEC) 20 MG capsule Take 20 mg by mouth 2 (two) times daily.       . ONE TOUCH ULTRA TEST test strip       . PARoxetine (PAXIL) 20 MG tablet Take 1 tablet (20 mg total) by mouth daily.  30 tablet  1  . predniSONE (DELTASONE) 10 MG tablet Take 10 mg by mouth daily.       .  rosuvastatin (CRESTOR) 10 MG tablet Take 10 mg by mouth every evening.      . senna (SENOKOT) 8.6 MG TABS tablet Take 1 tablet by mouth daily as needed for mild constipation.      . sodium chloride (OCEAN) 0.65 % nasal spray Place 1 spray into the nose every 6 (six) hours as needed for congestion.      . traMADol (ULTRAM) 50 MG tablet Take 50 mg by mouth every 6 (six) hours as needed. For pain      . warfarin (COUMADIN) 2 MG tablet Take 1 tablet (2 mg total) by mouth daily at 6 PM.  30 tablet  1   No current facility-administered medications for this visit.    Past Medical History  Diagnosis Date  . GERD (gastroesophageal reflux disease)   . COPD (chronic obstructive pulmonary disease)     a. On home oxygen  . Chronic combined systolic and diastolic CHF (congestive heart failure)   . Essential hypertension, benign   . Cardiomyopathy   . Right ventricular mural thrombus   . Cor pulmonale     a. Severe RV dysfunction;  b. 04/2013 R heart cath: RA 17, RV 70/18 (22), PA 73/52.  . Coronary atherosclerosis of native coronary artery     a. 05/2012 MI/PCI: DES to OM [facility];  b. 04/2013 Cath/PCI: LM nl, LAD nonobs, LCX patent mid stent, RCA dom, 271m (2.5x16 Promus DES).  Marland Kitchen. PAF (paroxysmal atrial fibrillation)     a. on amiodarone  . Spontaneous pneumothorax 2002  . PAT (paroxysmal atrial tachycardia)   . Mixed hyperlipidemia   . History of pneumonia   . Type II diabetes mellitus   . CKD (chronic kidney disease) stage 2, GFR 60-89 ml/min     Social History Danny [name] reports that he quit smoking about 19 years ago. His smoking use included Cigarettes. He has a 50 pack-year smoking history. He has never used smokeless tobacco. Danny [name] reports that he drinks alcohol.  Review of Systems No palpitations, occasional chest pain. Appetite fair. No fevers or chills, no cough. Otherwise negative.  Physical Examination Filed Vitals:   07/09/13 1031  BP: 112/63  Pulse: 71    Filed Weights   07/09/13 1031  Weight: 141 lb (63.957 kg)    Wearing oxygen via [place]. Chronically ill-appearing. In wheelchair.  Neck: Supple, no elevated JVP or carotid bruits, no thyromegaly.  Lungs: Decreased breath sounds, no wheezing, nonlabored breathing at rest.  Cardiac: Regular rate and rhythm, indistinct PMI, no S3 or significant systolic murmur, no pericardial rub.  Extremities: 1+ edema, distal pulses 2+.  Skin: Warm and dry.  Musculoskeletal: No kyphosis.  Neuropsychiatric: Alert and oriented x3, affect grossly appropriate.   Problem List and Plan  Coronary atherosclerosis of native coronary artery Continue medical therapy, recent cardiac catheterization showing stable coronary anatomy, most recent intervention being DES to the RCA in January of this year. Continues on Plavix, and concurrently on Coumadin.  Respiratory failure Associated with markedly pulmonary arterial hypertension, cor pulmonale, COPD. Not candidate for lung/heart transplantation after evaluation at [facility], recent PASP 94 mmHg right heart catheterization, limited options for treatment. Currently in Hospice.  RV (right ventricular) mural thrombus After above discussion, plan to continue Coumadin for now.  Atrial fibrillation And atrial tachycardia, fortunately stable on amiodarone.    Jonelle Sidle, M.D., F.A.C.C.

## 2013-07-09 NOTE — Patient Instructions (Signed)
Your physician recommends that you schedule a follow-up appointment in: 6 weeks with Dr Diona Browner    Your physician recommends that you continue on your current medications as directed. Please refer to the Current Medication list given to you today.

## 2013-07-09 NOTE — Assessment & Plan Note (Signed)
And atrial tachycardia, fortunately stable on amiodarone.

## 2013-07-11 ENCOUNTER — Encounter (HOSPITAL_COMMUNITY): Payer: Managed Care, Other (non HMO)

## 2013-07-30 ENCOUNTER — Ambulatory Visit (INDEPENDENT_AMBULATORY_CARE_PROVIDER_SITE_OTHER): Payer: Managed Care, Other (non HMO) | Admitting: *Deleted

## 2013-07-30 DIAGNOSIS — Z7901 Long term (current) use of anticoagulants: Secondary | ICD-10-CM

## 2013-07-30 DIAGNOSIS — I513 Intracardiac thrombosis, not elsewhere classified: Secondary | ICD-10-CM

## 2013-07-30 DIAGNOSIS — I219 Acute myocardial infarction, unspecified: Secondary | ICD-10-CM

## 2013-07-30 DIAGNOSIS — Z5181 Encounter for therapeutic drug level monitoring: Secondary | ICD-10-CM

## 2013-07-30 LAB — POCT INR: INR: 1.4

## 2013-08-06 ENCOUNTER — Ambulatory Visit (INDEPENDENT_AMBULATORY_CARE_PROVIDER_SITE_OTHER): Payer: Managed Care, Other (non HMO) | Admitting: *Deleted

## 2013-08-06 DIAGNOSIS — Z5181 Encounter for therapeutic drug level monitoring: Secondary | ICD-10-CM

## 2013-08-06 DIAGNOSIS — Z7901 Long term (current) use of anticoagulants: Secondary | ICD-10-CM

## 2013-08-06 DIAGNOSIS — I513 Intracardiac thrombosis, not elsewhere classified: Secondary | ICD-10-CM

## 2013-08-06 DIAGNOSIS — I219 Acute myocardial infarction, unspecified: Secondary | ICD-10-CM

## 2013-08-06 LAB — POCT INR: INR: 1.4

## 2013-08-13 ENCOUNTER — Ambulatory Visit (INDEPENDENT_AMBULATORY_CARE_PROVIDER_SITE_OTHER): Payer: Managed Care, Other (non HMO) | Admitting: *Deleted

## 2013-08-13 DIAGNOSIS — Z5181 Encounter for therapeutic drug level monitoring: Secondary | ICD-10-CM

## 2013-08-13 DIAGNOSIS — I219 Acute myocardial infarction, unspecified: Secondary | ICD-10-CM

## 2013-08-13 DIAGNOSIS — Z7901 Long term (current) use of anticoagulants: Secondary | ICD-10-CM

## 2013-08-13 DIAGNOSIS — I513 Intracardiac thrombosis, not elsewhere classified: Secondary | ICD-10-CM

## 2013-08-13 LAB — POCT INR: INR: 1.7

## 2013-08-20 ENCOUNTER — Ambulatory Visit (INDEPENDENT_AMBULATORY_CARE_PROVIDER_SITE_OTHER): Payer: Managed Care, Other (non HMO) | Admitting: *Deleted

## 2013-08-20 ENCOUNTER — Encounter: Payer: Self-pay | Admitting: Cardiology

## 2013-08-20 ENCOUNTER — Ambulatory Visit (INDEPENDENT_AMBULATORY_CARE_PROVIDER_SITE_OTHER): Payer: Managed Care, Other (non HMO) | Admitting: Cardiology

## 2013-08-20 VITALS — BP 132/62 | HR 95 | Ht 65.0 in | Wt 145.0 lb

## 2013-08-20 DIAGNOSIS — I2510 Atherosclerotic heart disease of native coronary artery without angina pectoris: Secondary | ICD-10-CM

## 2013-08-20 DIAGNOSIS — I219 Acute myocardial infarction, unspecified: Secondary | ICD-10-CM

## 2013-08-20 DIAGNOSIS — Z7901 Long term (current) use of anticoagulants: Secondary | ICD-10-CM

## 2013-08-20 DIAGNOSIS — I513 Intracardiac thrombosis, not elsewhere classified: Secondary | ICD-10-CM

## 2013-08-20 DIAGNOSIS — I4891 Unspecified atrial fibrillation: Secondary | ICD-10-CM

## 2013-08-20 DIAGNOSIS — J449 Chronic obstructive pulmonary disease, unspecified: Secondary | ICD-10-CM

## 2013-08-20 DIAGNOSIS — Z5181 Encounter for therapeutic drug level monitoring: Secondary | ICD-10-CM

## 2013-08-20 LAB — POCT INR: INR: 2.2

## 2013-08-20 NOTE — Patient Instructions (Signed)
Your physician recommends that you schedule a follow-up appointment in: 6 weeks     Your physician recommends that you continue on your current medications as directed. Please refer to the Current Medication list given to you today.     Thank you for choosing [facility] !         

## 2013-08-20 NOTE — Assessment & Plan Note (Signed)
And atrial tachycardia, remains stable on amiodarone.

## 2013-08-20 NOTE — Assessment & Plan Note (Signed)
Associated with marked pulmonary arterial hypertension, cor pulmonale, COPD. Not candidate for lung/heart transplantation after evaluation at [facility], recent PASP 94 mmHg right heart catheterization, limited options for treatment. Currently in Hospice.

## 2013-08-20 NOTE — Assessment & Plan Note (Signed)
Continue Coumadin for now. We have discussed discontinuation if it becomes a hardship to have his INR checked or his clinical status deteriorates. Really, no other alternative agent with proven efficacy.

## 2013-08-20 NOTE — Progress Notes (Signed)
Clinical Summary Mr. Carolin CoyRoach is a medically complex 58 y.o.male last seen in April of this year. He continues in Hospice with end-stage, oxygen-dependent COPD associated with marked pulmonary arterial hypertension (PASP 94 mmHg), turned down for heart/lung transplant with [facility].  He continues on Coumadin with history of PAF and also RV thrombus.  States that he has been weak, but has had a stable appetite. Has had no chest pain or palpitations. Feels a numbness and tingling in his fingers and feet at times. His wife also indicated to him that he was having some leg movements at nighttime. Otherwise no change.   Allergies  Allergen Reactions  . Lipitor [Atorvastatin] Other (See Comments)    Muscle spasms     Current Outpatient Prescriptions  Medication Sig Dispense Refill  . Aclidinium Bromide (TUDORZA PRESSAIR) 400 MCG/ACT AEPB Inhale 1 puff into the lungs 2 (two) times daily.  1 each  3  . albuterol (PROVENTIL HFA;VENTOLIN HFA) 108 (90 BASE) MCG/ACT inhaler Inhale 2 puffs into the lungs every 6 (six) hours as needed for wheezing or shortness of breath.       Marland Kitchen. albuterol (PROVENTIL) (2.5 MG/3ML) 0.083% nebulizer solution Take 3 mLs (2.5 mg total) by nebulization every 2 (two) hours as needed for wheezing.  75 mL  12  . amiodarone (PACERONE) 200 MG tablet Take 1 tablet (200 mg total) by mouth daily.  30 tablet  6  . arformoterol (BROVANA) 15 MCG/2ML NEBU Take 15 mcg by nebulization 2 (two) times daily.      . B-D ULTRAFINE III SHORT PEN 31G X 8 MM MISC       . budesonide (PULMICORT) 0.25 MG/2ML nebulizer solution Take 0.25 mg by nebulization 2 (two) times daily.      . cloNIDine (CATAPRES) 0.1 MG tablet Take 1 tablet (0.1 mg total) by mouth 2 (two) times daily.      . clopidogrel (PLAVIX) 75 MG tablet TAKE 1 TABLET BY MOUTH EVERY DAY WITH BREAKFAST  30 tablet  11  . digoxin (LANOXIN) 0.125 MG tablet Take 0.125 mg by mouth every morning.      . furosemide (LASIX) 40 MG  tablet Take 1 tablet (40 mg total) by mouth daily.  30 tablet  11  . guaiFENesin-dextromethorphan (ROBITUSSIN DM) 100-10 MG/5ML syrup Take 5 mLs by mouth every 4 (four) hours as needed for cough.  118 mL  0  . LANTUS SOLOSTAR 100 UNIT/ML Solostar Pen       . LORazepam (ATIVAN) 0.5 MG tablet Take 0.5 mg by mouth 2 (two) times daily. 1/2 tablet bid      . losartan (COZAAR) 50 MG tablet Take 1 tablet (50 mg total) by mouth daily.  30 tablet  6  . nitroGLYCERIN (NITROSTAT) 0.4 MG SL tablet Place 1 tablet (0.4 mg total) under the tongue every 5 (five) minutes x 3 doses as needed for chest pain.  25 tablet  3  . omeprazole (PRILOSEC) 20 MG capsule Take 20 mg by mouth 2 (two) times daily.       . ONE TOUCH ULTRA TEST test strip       . ONETOUCH DELICA LANCETS 33G MISC       . PARoxetine (PAXIL) 20 MG tablet Take 1 tablet (20 mg total) by mouth daily.  30 tablet  1  . predniSONE (DELTASONE) 10 MG tablet Take 10 mg by mouth daily.       . rosuvastatin (CRESTOR) 10 MG tablet Take 10  mg by mouth every evening.      . senna (SENOKOT) 8.6 MG TABS tablet Take 1 tablet by mouth daily as needed for mild constipation.      . sodium chloride (OCEAN) 0.65 % nasal spray Place 1 spray into the nose every 6 (six) hours as needed for congestion.      . traMADol (ULTRAM) 50 MG tablet Take 50 mg by mouth every 6 (six) hours as needed. For pain      . warfarin (COUMADIN) 2 MG tablet Take 1 tablet (2 mg total) by mouth daily at 6 PM.  30 tablet  1   No current facility-administered medications for this visit.    Past Medical History  Diagnosis Date  . GERD (gastroesophageal reflux disease)   . COPD (chronic obstructive pulmonary disease)     a. On home oxygen  . Chronic combined systolic and diastolic CHF (congestive heart failure)   . Essential hypertension, benign   . Cardiomyopathy   . Right ventricular mural thrombus   . Cor pulmonale     a. Severe RV dysfunction;  b. 04/2013 R heart cath: RA 17, RV 70/18  (22), PA 73/52.  . Coronary atherosclerosis of native coronary artery     a. 05/2012 MI/PCI: DES to OM [facility];  b. 04/2013 Cath/PCI: LM nl, LAD nonobs, LCX patent mid stent, RCA dom, 94m (2.5x16 Promus DES).  Marland Kitchen PAF (paroxysmal atrial fibrillation)     a. on amiodarone  . Spontaneous pneumothorax 2002  . PAT (paroxysmal atrial tachycardia)   . Mixed hyperlipidemia   . History of pneumonia   . Type II diabetes mellitus   . CKD (chronic kidney disease) stage 2, GFR 60-89 ml/min     Social History Mr. Nordan reports that he quit smoking about 19 years ago. His smoking use included Cigarettes. He has a 50 pack-year smoking history. He has never used smokeless tobacco. Mr. Thunstrom reports that he drinks alcohol.  Review of Systems Negative except as outlined.  Physical Examination Filed Vitals:   08/20/13 1106  BP: 132/62  Pulse: 95   Filed Weights   08/20/13 1106  Weight: 145 lb (65.772 kg)    Wearing oxygen via [place]. Chronically ill-appearing. Neck: Supple, no elevated JVP or carotid bruits, no thyromegaly.  Lungs: Decreased breath sounds, no wheezing, nonlabored breathing at rest.  Cardiac: Regular rate and rhythm, indistinct PMI, no S3 or significant systolic murmur, no pericardial rub.  Extremities: 1+ edema, distal pulses 2+.  Skin: Warm and dry.  Musculoskeletal: No kyphosis.  Neuropsychiatric: Alert and oriented x3, affect grossly appropriate.   Problem List and Plan   Atrial fibrillation And atrial tachycardia, remains stable on amiodarone.  Coronary atherosclerosis of native coronary artery Continue medical therapy, recent cardiac catheterization showing stable coronary anatomy, most recent intervention being DES to the RCA in January of this year. Continues on Plavix, and concurrently on Coumadin.  RV (right ventricular) mural thrombus Continue Coumadin for now. We have discussed discontinuation if it becomes a hardship to have his INR checked or his  clinical status deteriorates. Really, no other alternative agent with proven efficacy.  COPD (chronic obstructive pulmonary disease) Associated with marked pulmonary arterial hypertension, cor pulmonale, COPD. Not candidate for lung/heart transplantation after evaluation at [facility], recent PASP 94 mmHg right heart catheterization, limited options for treatment. Currently in Hospice.    Jonelle Sidle, M.D., F.A.C.C.

## 2013-08-20 NOTE — Assessment & Plan Note (Signed)
Continue medical therapy, recent cardiac catheterization showing stable coronary anatomy, most recent intervention being DES to the RCA in January of this year. Continues on Plavix, and concurrently on Coumadin. 

## 2013-09-03 ENCOUNTER — Ambulatory Visit (INDEPENDENT_AMBULATORY_CARE_PROVIDER_SITE_OTHER): Payer: Managed Care, Other (non HMO) | Admitting: *Deleted

## 2013-09-03 DIAGNOSIS — Z5181 Encounter for therapeutic drug level monitoring: Secondary | ICD-10-CM

## 2013-09-03 DIAGNOSIS — I513 Intracardiac thrombosis, not elsewhere classified: Secondary | ICD-10-CM

## 2013-09-03 DIAGNOSIS — I219 Acute myocardial infarction, unspecified: Secondary | ICD-10-CM

## 2013-09-03 DIAGNOSIS — Z7901 Long term (current) use of anticoagulants: Secondary | ICD-10-CM

## 2013-09-03 LAB — POCT INR: INR: 1.7

## 2013-09-17 ENCOUNTER — Ambulatory Visit (INDEPENDENT_AMBULATORY_CARE_PROVIDER_SITE_OTHER): Payer: Managed Care, Other (non HMO) | Admitting: *Deleted

## 2013-09-17 DIAGNOSIS — I219 Acute myocardial infarction, unspecified: Secondary | ICD-10-CM

## 2013-09-17 DIAGNOSIS — Z5181 Encounter for therapeutic drug level monitoring: Secondary | ICD-10-CM

## 2013-09-17 DIAGNOSIS — Z7901 Long term (current) use of anticoagulants: Secondary | ICD-10-CM

## 2013-09-17 DIAGNOSIS — I513 Intracardiac thrombosis, not elsewhere classified: Secondary | ICD-10-CM

## 2013-09-17 LAB — POCT INR: INR: 2

## 2013-10-02 ENCOUNTER — Ambulatory Visit (INDEPENDENT_AMBULATORY_CARE_PROVIDER_SITE_OTHER): Payer: Managed Care, Other (non HMO) | Admitting: *Deleted

## 2013-10-02 DIAGNOSIS — Z7901 Long term (current) use of anticoagulants: Secondary | ICD-10-CM

## 2013-10-02 DIAGNOSIS — I219 Acute myocardial infarction, unspecified: Secondary | ICD-10-CM

## 2013-10-02 DIAGNOSIS — I513 Intracardiac thrombosis, not elsewhere classified: Secondary | ICD-10-CM

## 2013-10-02 DIAGNOSIS — Z5181 Encounter for therapeutic drug level monitoring: Secondary | ICD-10-CM

## 2013-10-02 LAB — POCT INR: INR: 2.5

## 2013-10-02 MED ORDER — WARFARIN SODIUM 2 MG PO TABS: ORAL_TABLET | ORAL | Status: AC

## 2013-10-07 ENCOUNTER — Ambulatory Visit (INDEPENDENT_AMBULATORY_CARE_PROVIDER_SITE_OTHER): Payer: Managed Care, Other (non HMO) | Admitting: Cardiology

## 2013-10-07 ENCOUNTER — Encounter: Payer: Self-pay | Admitting: Cardiology

## 2013-10-07 VITALS — BP 140/82 | HR 96 | Ht 66.0 in | Wt 152.0 lb

## 2013-10-07 DIAGNOSIS — I219 Acute myocardial infarction, unspecified: Secondary | ICD-10-CM

## 2013-10-07 DIAGNOSIS — I25119 Atherosclerotic heart disease of native coronary artery with unspecified angina pectoris: Secondary | ICD-10-CM

## 2013-10-07 DIAGNOSIS — I4891 Unspecified atrial fibrillation: Secondary | ICD-10-CM

## 2013-10-07 DIAGNOSIS — I513 Intracardiac thrombosis, not elsewhere classified: Secondary | ICD-10-CM

## 2013-10-07 DIAGNOSIS — J439 Emphysema, unspecified: Secondary | ICD-10-CM

## 2013-10-07 DIAGNOSIS — I480 Paroxysmal atrial fibrillation: Secondary | ICD-10-CM

## 2013-10-07 DIAGNOSIS — I209 Angina pectoris, unspecified: Secondary | ICD-10-CM

## 2013-10-07 DIAGNOSIS — I2510 Atherosclerotic heart disease of native coronary artery without angina pectoris: Secondary | ICD-10-CM

## 2013-10-07 DIAGNOSIS — J438 Other emphysema: Secondary | ICD-10-CM

## 2013-10-07 NOTE — Assessment & Plan Note (Signed)
Continue medical therapy, most recent intervention being DES to the RCA in January of this year. Continues on Plavix, and concurrently on Coumadin.

## 2013-10-07 NOTE — Assessment & Plan Note (Signed)
Associated with marked pulmonary arterial hypertension, cor pulmonale, COPD. Not candidate for lung/heart transplantation after evaluation at [facility], recent PASP 94 mmHg right heart catheterization, limited options for treatment. Currently in Hospice. 

## 2013-10-07 NOTE — Assessment & Plan Note (Signed)
Continue Coumadin. We have discussed discontinuation if it becomes a hardship to have his INR checked or his clinical status deteriorates. Really, no other alternative agent with proven efficacy.

## 2013-10-07 NOTE — Progress Notes (Signed)
Clinical Summary Danny [name] is a medically complex 58 y.o.male last seen in May of this year. He is here with his wife today. Seems to be slowly declining. Short of breath when he moves around the house even on oxygen. Occasional brief chest discomfort. No progressive leg swelling.  He continues in Hospice with end-stage, oxygen-dependent COPD associated with marked pulmonary arterial hypertension (PASP 94 mmHg), turned down for heart/lung transplant with evaluation at [facility].  Hospice nursing checks in with him at least twice a week. He is now using Ativan and liquid morphine intermittently. Has had some chest congestion, taking Mucinex. He continues to follow a Dr. Juanetta Gosling as well.   Allergies  Allergen Reactions  . Lipitor [Atorvastatin] Other (See Comments)    Muscle spasms     Current Outpatient Prescriptions  Medication Sig Dispense Refill  . Aclidinium Bromide (TUDORZA PRESSAIR) 400 MCG/ACT AEPB Inhale 1 puff into the lungs 2 (two) times daily.  1 each  3  . amiodarone (PACERONE) 200 MG tablet Take 1 tablet (200 mg total) by mouth daily.  30 tablet  6  . arformoterol (BROVANA) 15 MCG/2ML NEBU Take 15 mcg by nebulization 2 (two) times daily.      . B-D ULTRAFINE III SHORT PEN 31G X 8 MM MISC       . budesonide (PULMICORT) 0.25 MG/2ML nebulizer solution Take 0.25 mg by nebulization 2 (two) times daily.      . cloNIDine (CATAPRES) 0.1 MG tablet Take 1 tablet (0.1 mg total) by mouth 2 (two) times daily.      . clopidogrel (PLAVIX) 75 MG tablet TAKE 1 TABLET BY MOUTH EVERY DAY WITH BREAKFAST  30 tablet  11  . digoxin (LANOXIN) 0.125 MG tablet Take 0.125 mg by mouth every morning.      . furosemide (LASIX) 40 MG tablet Take 1 tablet (40 mg total) by mouth daily.  30 tablet  11  . LANTUS SOLOSTAR 100 UNIT/ML Solostar Pen       . LORazepam (ATIVAN) 0.5 MG tablet Take 1 mg by mouth every 4 (four) hours as needed. 1/2 tablet bid      . losartan (COZAAR) 50 MG tablet Take 1 tablet (50 mg  total) by mouth daily.  30 tablet  6  . morphine (ROXANOL) 20 MG/ML concentrated solution Take 20 mg by mouth every 2 (two) hours as needed for severe pain.      . nitroGLYCERIN (NITROSTAT) 0.4 MG SL tablet Place 1 tablet (0.4 mg total) under the tongue every 5 (five) minutes x 3 doses as needed for chest pain.  25 tablet  3  . ONE TOUCH ULTRA TEST test strip       . ONETOUCH DELICA LANCETS 33G MISC       . PARoxetine (PAXIL) 20 MG tablet Take 1 tablet (20 mg total) by mouth daily.  30 tablet  1  . predniSONE (DELTASONE) 10 MG tablet Take 10 mg by mouth daily.       Marland Kitchen senna (SENOKOT) 8.6 MG TABS tablet Take 1 tablet by mouth daily as needed for mild constipation.      . sodium chloride (OCEAN) 0.65 % nasal spray Place 1 spray into the nose every 6 (six) hours as needed for congestion.      . traMADol (ULTRAM) 50 MG tablet Take 50 mg by mouth every 6 (six) hours as needed. For pain      . warfarin (COUMADIN) 2 MG tablet Take coumadin 1 1/2  tablet daily except 2 tablets on Mondays, Wednesdays and Fridays or as directed  60 tablet  3   No current facility-administered medications for this visit.    Past Medical History  Diagnosis Date  . GERD (gastroesophageal reflux disease)   . COPD (chronic obstructive pulmonary disease)     a. On home oxygen  . Chronic combined systolic and diastolic CHF (congestive heart failure)   . Essential hypertension, benign   . Cardiomyopathy   . Right ventricular mural thrombus   . Cor pulmonale     a. Severe RV dysfunction;  b. 04/2013 R heart cath: RA 17, RV 70/18 (22), PA 73/52.  . Coronary atherosclerosis of native coronary artery     a. 05/2012 MI/PCI: DES to OM [facility];  b. 04/2013 Cath/PCI: LM nl, LAD nonobs, LCX patent mid stent, RCA dom, 3716m (2.5x16 Promus DES).  Marland Kitchen. PAF (paroxysmal atrial fibrillation)     a. on amiodarone  . Spontaneous pneumothorax 2002  . PAT (paroxysmal atrial tachycardia)   . Mixed hyperlipidemia   . History of  pneumonia   . Type II diabetes mellitus   . CKD (chronic kidney disease) stage 2, GFR 60-89 ml/min     Social History Danny [name] reports that he quit smoking about 19 years ago. His smoking use included Cigarettes. He has a 50 pack-year smoking history. He has never used smokeless tobacco. Danny [name] reports that he drinks alcohol.  Review of Systems Systems reviewed and negative except as outlined above.  Physical Examination Filed Vitals:   10/07/13 1051  BP: 140/82  Pulse: 96   Filed Weights   10/07/13 1051  Weight: 152 lb (68.947 kg)    Wearing oxygen via [place]. Chronically ill-appearing.  Neck: Supple, no elevated JVP or carotid bruits, no thyromegaly.  Lungs: Decreased breath sounds, no wheezing, scattered upper rhonchi, nonlabored breathing at rest.  Cardiac: Regular rate and rhythm, indistinct PMI, no S3 or significant systolic murmur, no pericardial rub.  Extremities: 1+ edema, distal pulses 2+.  Skin: Warm and dry.  Musculoskeletal: No kyphosis.  Neuropsychiatric: Alert and oriented x3, affect grossly appropriate.   Problem List and Plan   Coronary atherosclerosis of native coronary artery Continue medical therapy, most recent intervention being DES to the RCA in January of this year. Continues on Plavix, and concurrently on Coumadin.    RV (right ventricular) mural thrombus Continue Coumadin. We have discussed discontinuation if it becomes a hardship to have his INR checked or his clinical status deteriorates. Really, no other alternative agent with proven efficacy.    COPD (chronic obstructive pulmonary disease) Associated with marked pulmonary arterial hypertension, cor pulmonale, COPD. Not candidate for lung/heart transplantation after [facility], recent PASP 94 mmHg right heart catheterization, limited options for treatment. Currently in Hospice.  Atrial fibrillation Remains reasonably controlled on amiodarone.      Danny [name], M.D.,  F.A.C.C.

## 2013-10-07 NOTE — Assessment & Plan Note (Signed)
Remains reasonably controlled on amiodarone.

## 2013-10-07 NOTE — Patient Instructions (Signed)
Your physician recommends that you schedule a follow-up appointment in: 6 weeks     Your physician recommends that you continue on your current medications as directed. Please refer to the Current Medication list given to you today.     Thank you for choosing [facility] !         

## 2013-10-23 ENCOUNTER — Ambulatory Visit (INDEPENDENT_AMBULATORY_CARE_PROVIDER_SITE_OTHER): Payer: Managed Care, Other (non HMO) | Admitting: *Deleted

## 2013-10-23 DIAGNOSIS — Z7901 Long term (current) use of anticoagulants: Secondary | ICD-10-CM

## 2013-10-23 DIAGNOSIS — I513 Intracardiac thrombosis, not elsewhere classified: Secondary | ICD-10-CM

## 2013-10-23 DIAGNOSIS — Z5181 Encounter for therapeutic drug level monitoring: Secondary | ICD-10-CM

## 2013-10-23 DIAGNOSIS — I219 Acute myocardial infarction, unspecified: Secondary | ICD-10-CM

## 2013-10-23 LAB — POCT INR: INR: 2.6

## 2013-10-31 ENCOUNTER — Telehealth: Payer: Self-pay | Admitting: Adult Health

## 2013-10-31 MED ORDER — POTASSIUM CHLORIDE ER 10 MEQ PO TBCR: 10.0000 meq | EXTENDED_RELEASE_TABLET | Freq: Two times a day (BID) | ORAL | Status: AC

## 2013-10-31 NOTE — Telephone Encounter (Signed)
Needs new RX for potassium chloride sent to [facility] and let them know that patient is under hospice of [name]. / tgs

## 2013-10-31 NOTE — Telephone Encounter (Signed)
Medication sent via escribe. Made pharmacy aware

## 2013-11-05 ENCOUNTER — Ambulatory Visit: Payer: Self-pay | Admitting: *Deleted

## 2013-11-05 DIAGNOSIS — Z5181 Encounter for therapeutic drug level monitoring: Secondary | ICD-10-CM

## 2013-11-05 DIAGNOSIS — I513 Intracardiac thrombosis, not elsewhere classified: Secondary | ICD-10-CM

## 2013-11-20 ENCOUNTER — Ambulatory Visit: Payer: Managed Care, Other (non HMO) | Admitting: Cardiology

## 2013-12-02 DEATH — deceased

## 2014-03-12 ENCOUNTER — Encounter (HOSPITAL_COMMUNITY): Payer: Self-pay | Admitting: Interventional Cardiology

## 2014-12-26 IMAGING — CR DG CHEST 1V PORT
1 series · 1 of 1 positions shown · non-contrast
Comparison: Portable chest x-ray March 17, 2013.

CLINICAL DATA: Two-day history of dyspnea, history of COPD and
previous tobacco use.

EXAM:
PORTABLE CHEST - 1 VIEW

[AP]
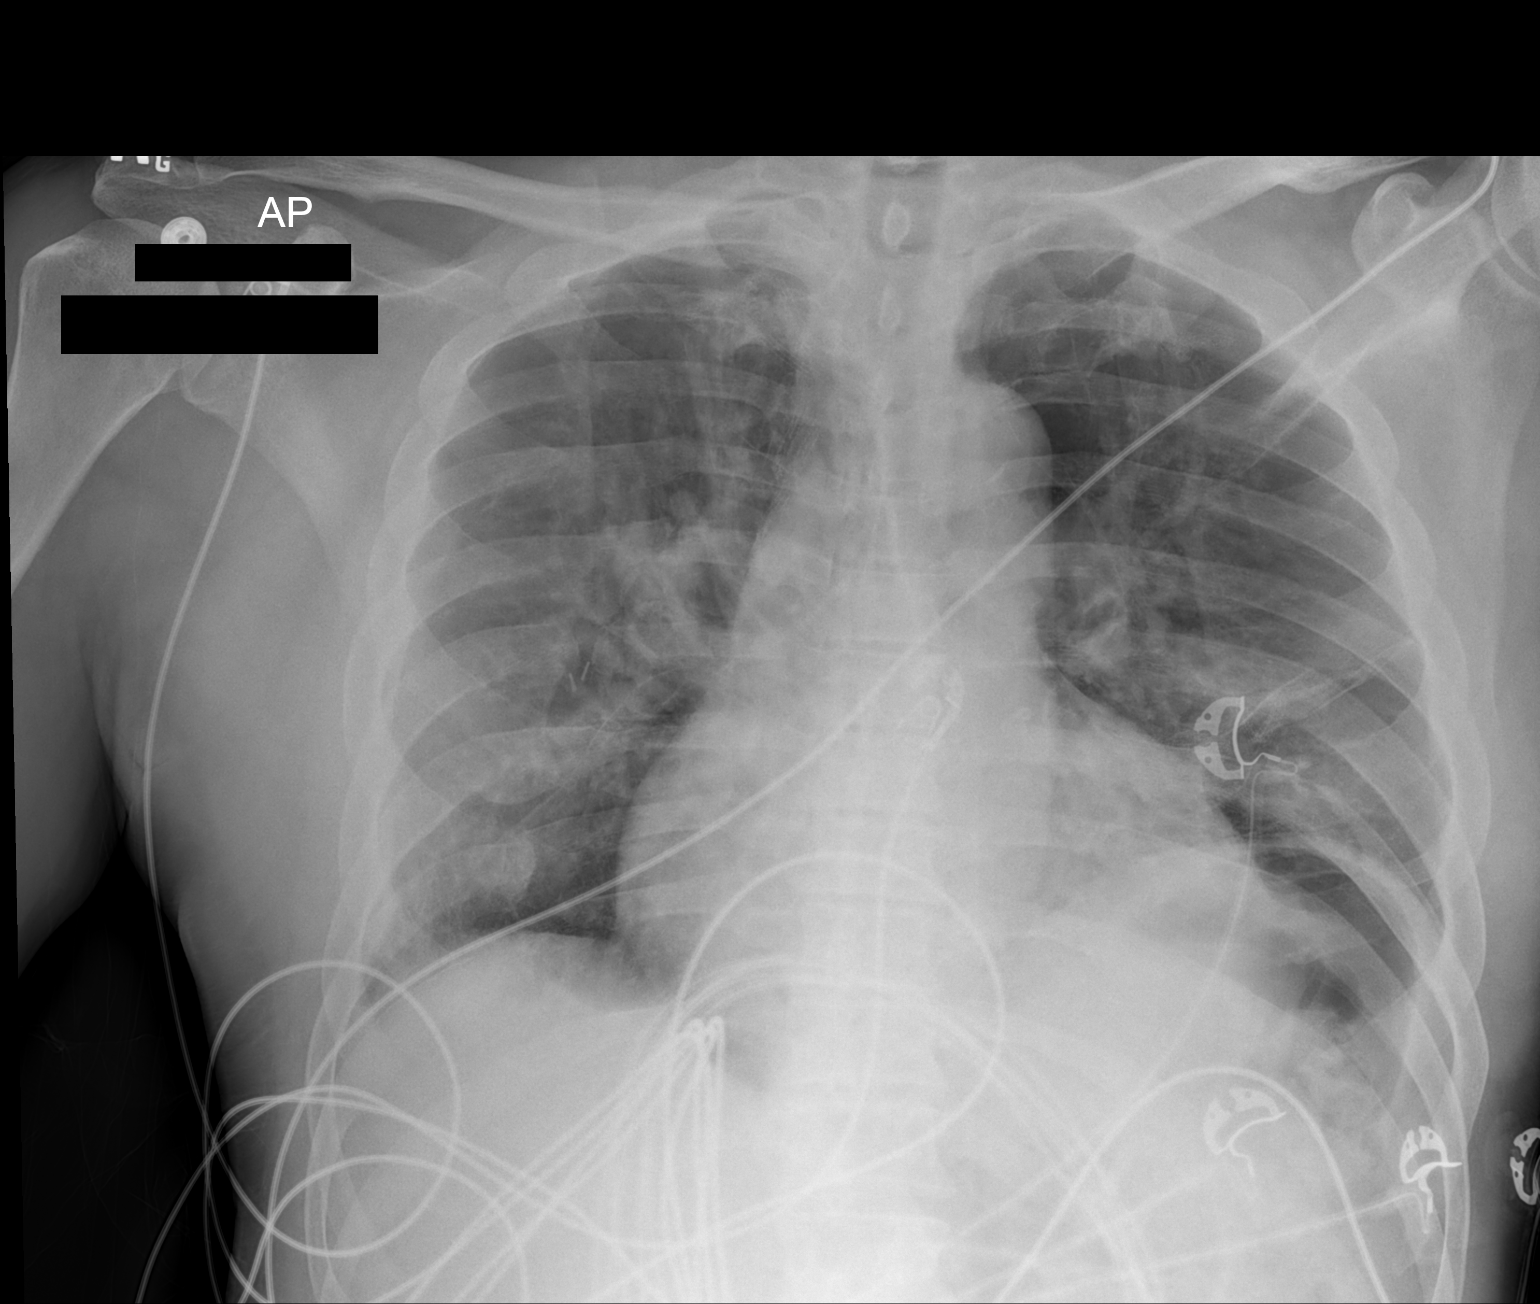

[1 of 1 positions shown; findings below may reference images not displayed]

FINDINGS: The lungs are borderline hypoinflated. The patient has undergone
previous partial lobectomy presumably on the right where there are
chronic deformities of posterior ribs. The left hemidiaphragm is
higher than the right. There is increased interstitial density in
both lungs more conspicuous than in the past. The cardiopericardial
silhouette is mildly enlarged. The central pulmonary vascularity is
engorged. There is mild tortuosity of the descending thoracic aorta.
There is no pleural effusion.
IMPRESSION: Mildly increased interstitial markings and prominence of the
pulmonary vascularity suggests CHF. These findings have appeared
since the previous study.

## 2015-01-16 IMAGING — CR DG CHEST 2V
2 series · 2 of 2 positions shown · non-contrast
Comparison: Portable chest x-ray March 29, 2013.

CLINICAL DATA: Dyspnea and weakness with history of COPD, on
waiting list for lung transplant.

EXAM:
CHEST  2 VIEW

[w chest pa]
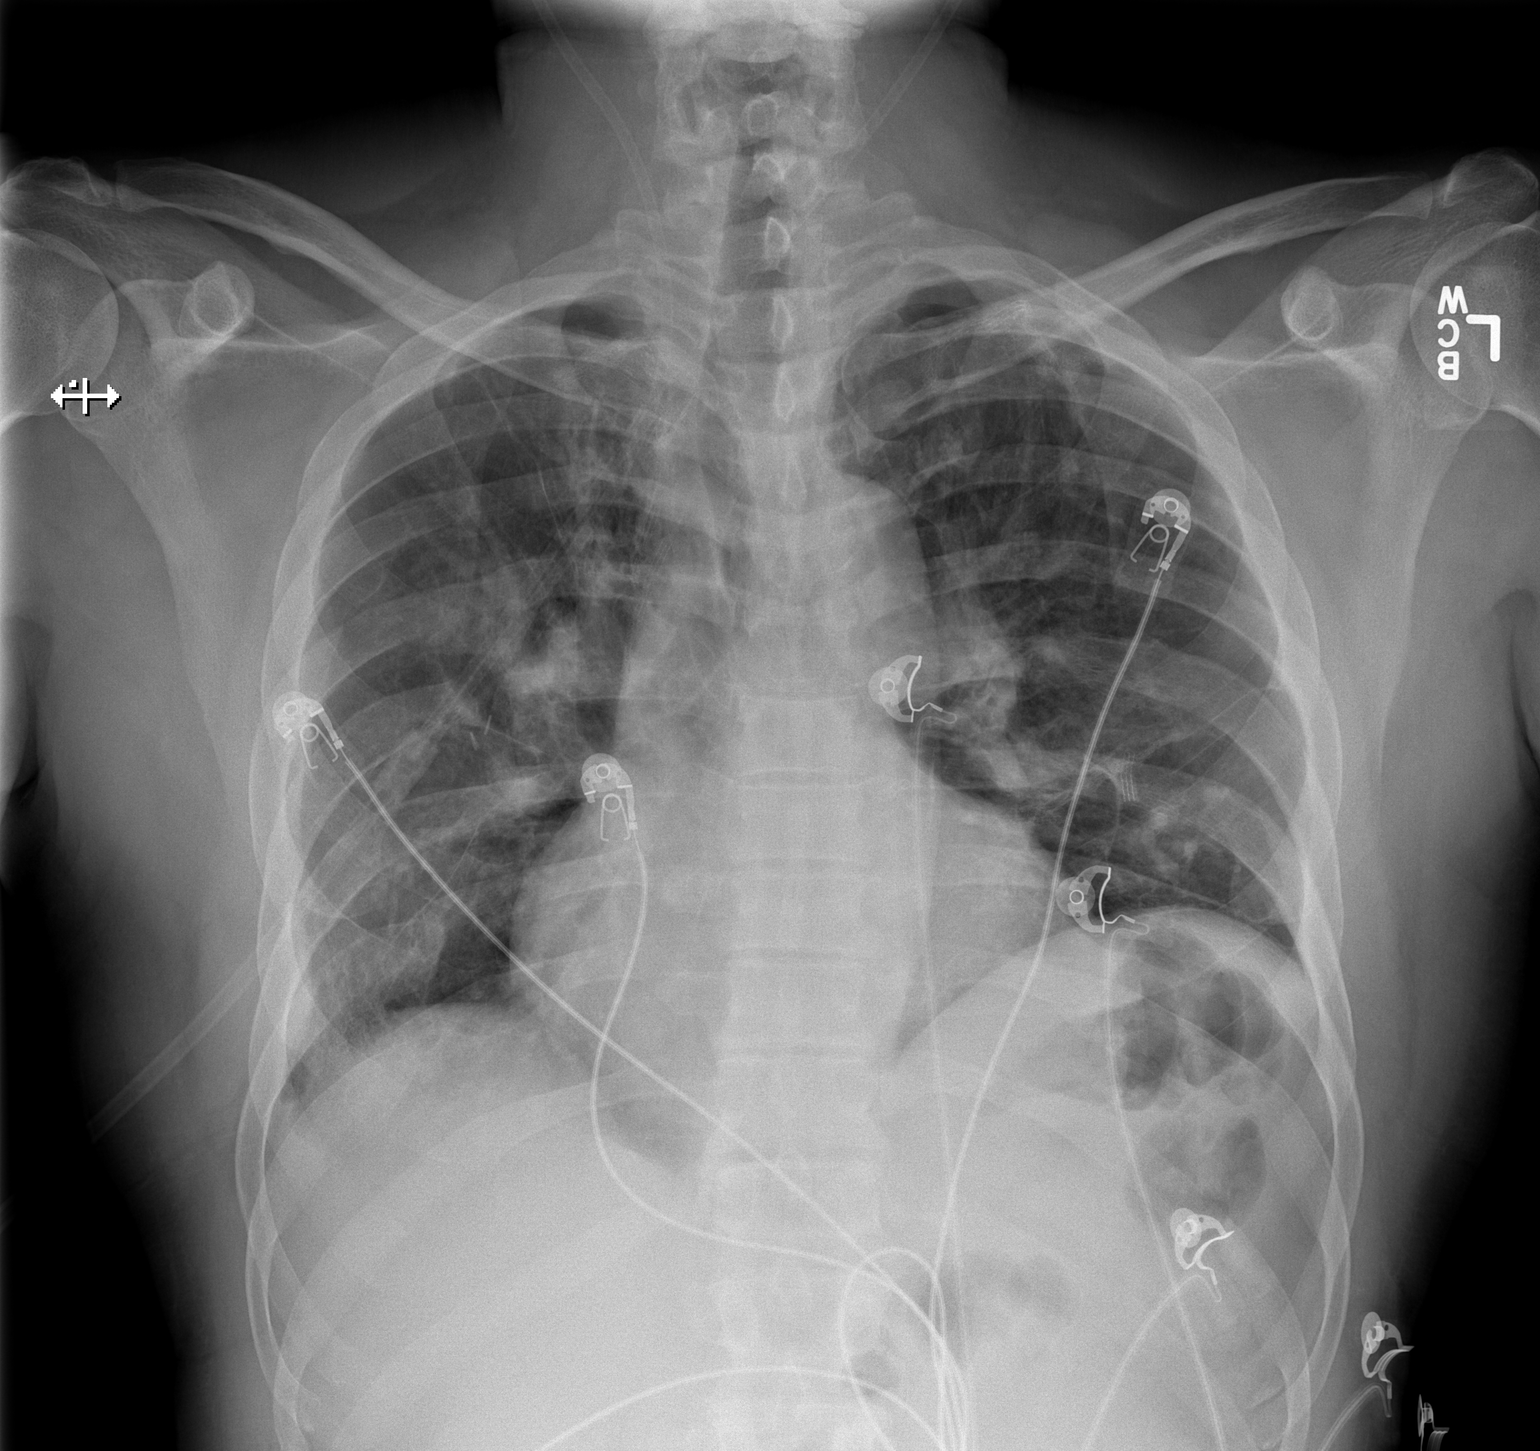

[w chest lat]
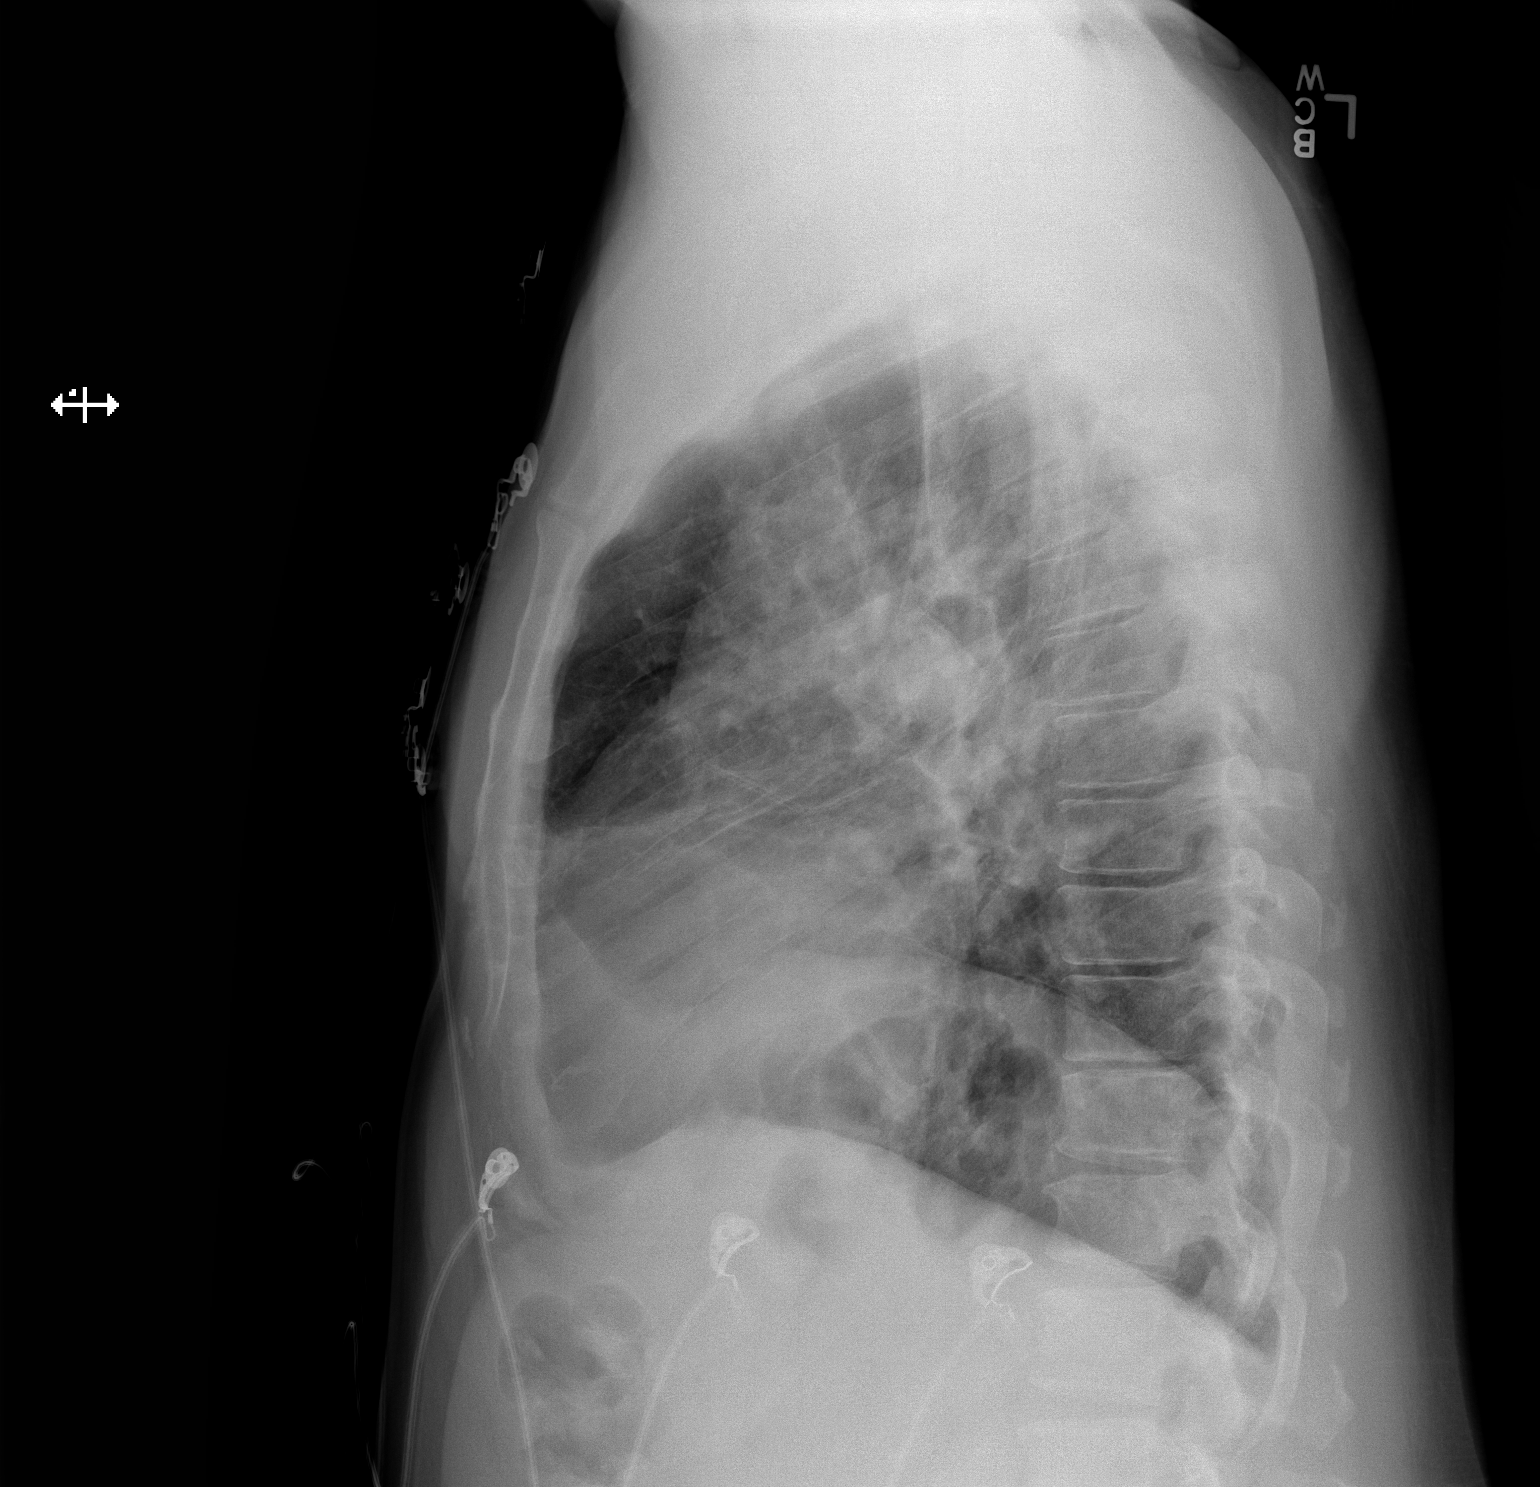

[2 of 2 positions shown; findings below may reference images not displayed]

FINDINGS: The lungs are borderline hypoinflated. This appears stable. The left
hemidiaphragm is higher than the right which is also stable. There
are coarse increased lung markings bilaterally with areas of near
confluence. These are stable. The cardiac silhouette is top-normal
in size. The pulmonary vascularity is not engorged. There is no
pleural effusion or pneumothorax. The mediastinum is normal in
width. The observed portions of the bony thorax appear normal.
IMPRESSION: There has not been significant interval change since the study 29 March, 2013. Bilateral pleural and parenchymal scarring is
suspected. There is no pneumothorax. An element of low grade CHF may
be present.

## 2015-03-16 IMAGING — CR DG CHEST 1V PORT
1 series · 1 of 1 positions shown · non-contrast
Comparison: DG CHEST 2 VIEW dated 06/10/2013;

CLINICAL DATA: Respiratory distress, COPD, CHF

EXAM:
PORTABLE CHEST - 1 VIEW

[portable]
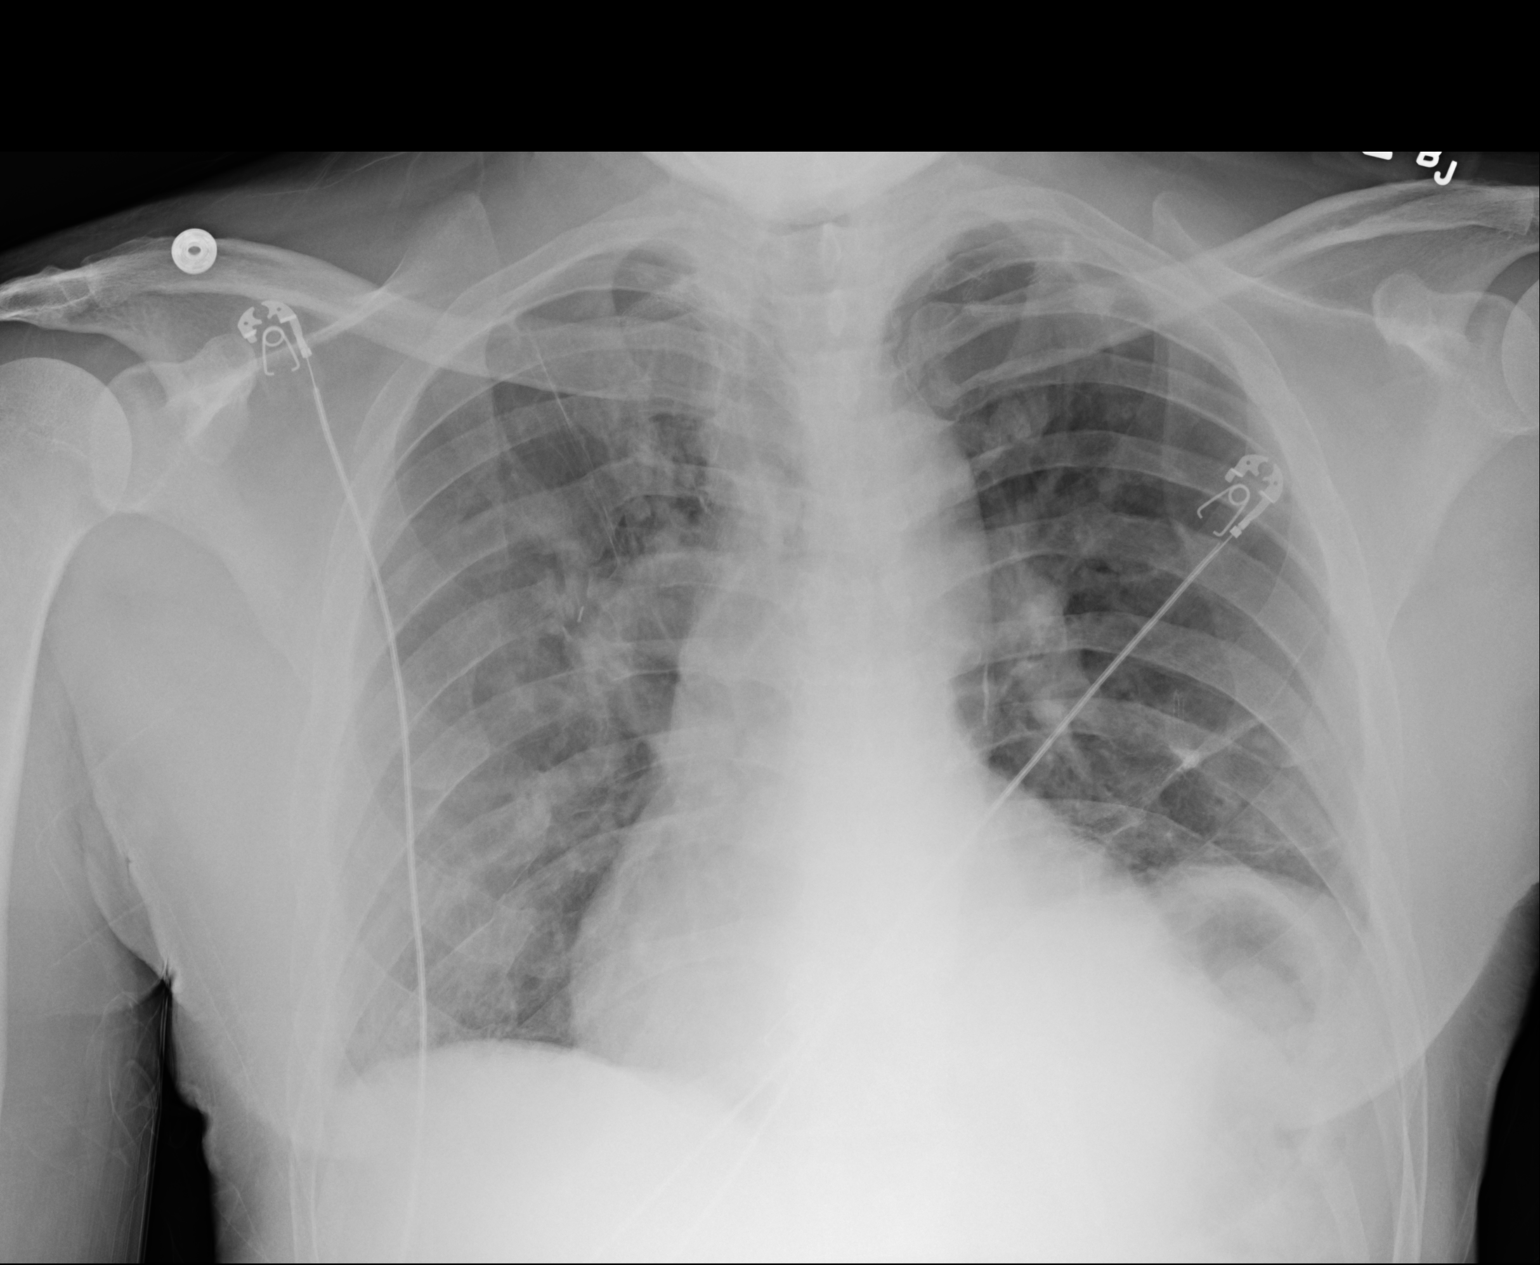

[1 of 1 positions shown; findings below may reference images not displayed]

CT CHEST W/CM dated
05/27/2013; DG CHEST 2 VIEW dated 06/03/2013; CT ANGIO CHEST W/CM
&/OR WO/CM dated 10/23/2012; DG CHEST 1V PORT dated 10/23/2012
FINDINGS: Partial left pneumonectomy. Bilateral emphysematous changes. Bullous
emphysema at the left apex. Bibasilar scarring again noted. Mild
bilateral interstitial thickening likely chronic. No focal
consolidation, pleural effusion or pneumothorax. Stable
cardiomediastinal silhouette.

The osseous structures are unremarkable.
IMPRESSION: No active cardiopulmonary disease.

## 2015-03-20 IMAGING — CR DG CHEST 1V PORT
1 series · 1 of 1 positions shown · non-contrast
Comparison: DG CHEST 1V PORT dated 06/17/2013

CLINICAL DATA: Pre-VQ scan

EXAM:
PORTABLE CHEST - 1 VIEW

[AP]
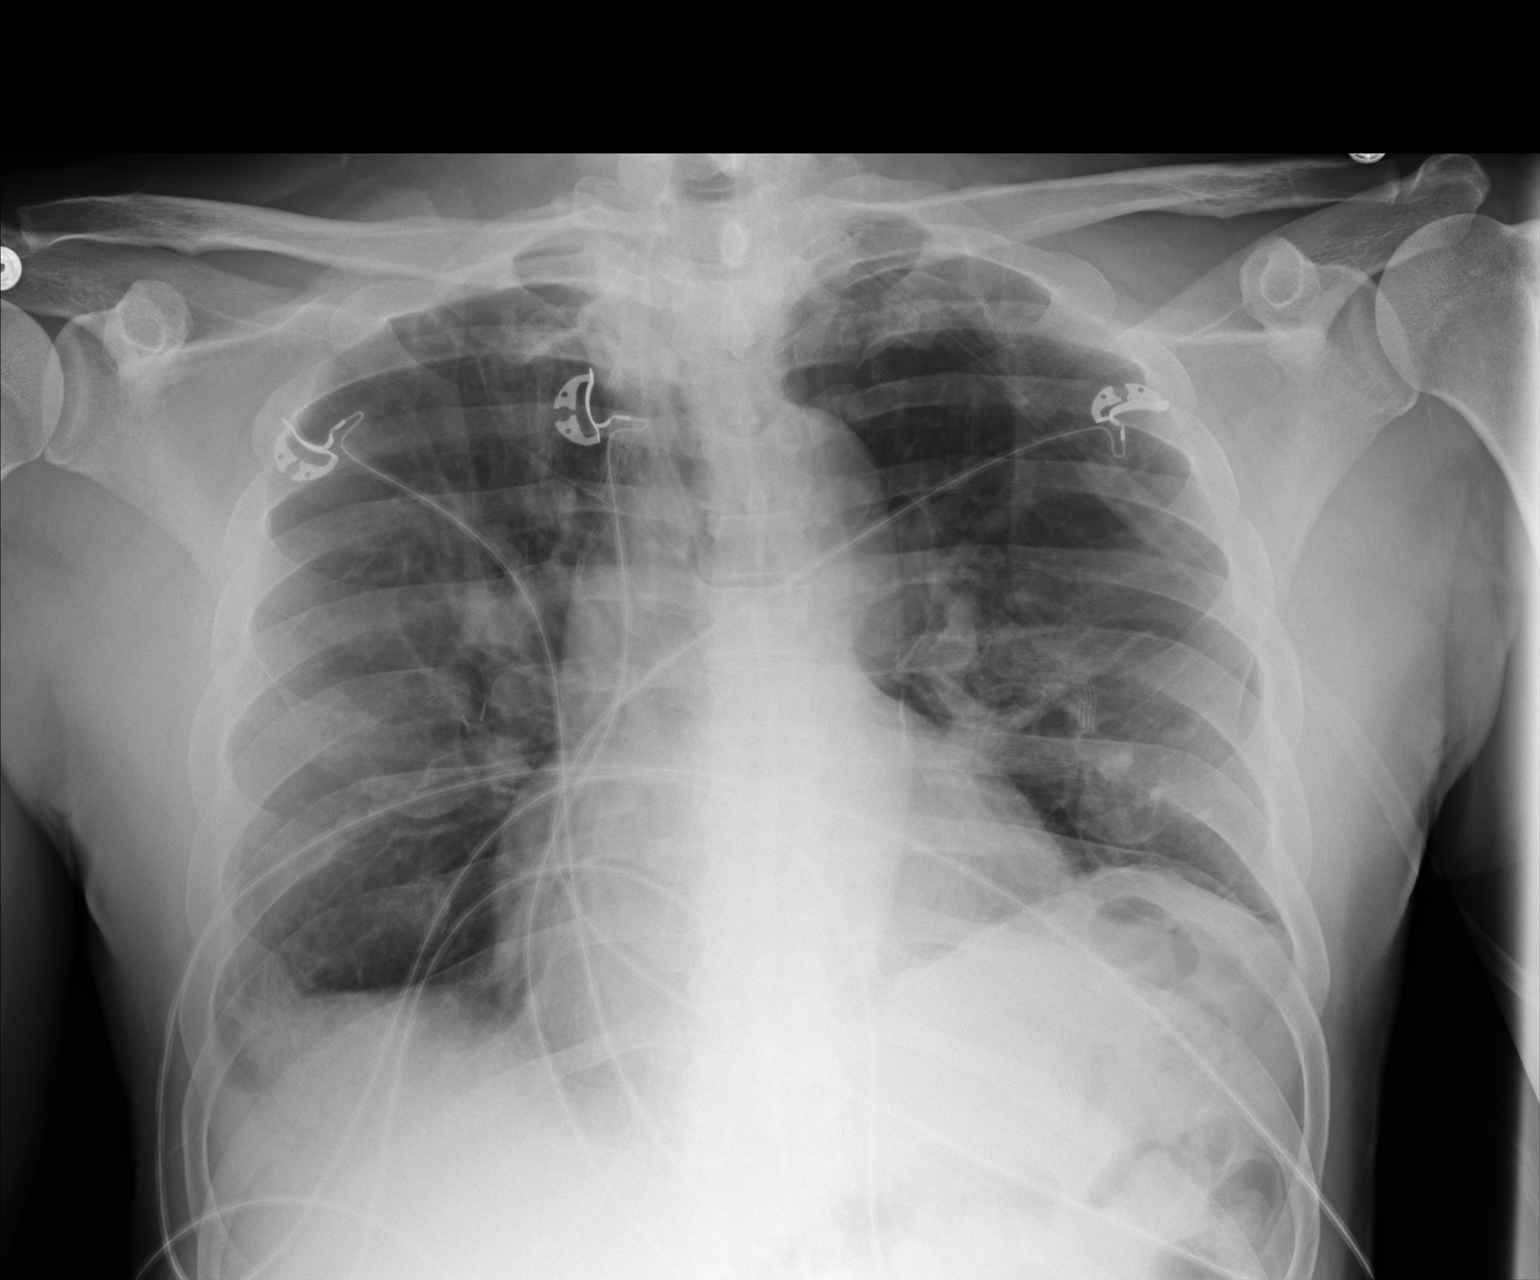

[1 of 1 positions shown; findings below may reference images not displayed]

FINDINGS: Partial pneumonectomy changes left hemithorax. No focal regions of
consolidation. Increased density projects within the right
costophrenic angle, and mild blunting. The cardiac silhouette is
moderately enlarged. Atherosclerotic calcifications identified
within aorta. There is mild prominence of the interstitial markings.
The osseous structures are unremarkable.
IMPRESSION: Atelectasis versus infiltrate, chronic scar versus small effusion,
the right costophrenic angle.

No further focal regions of consolidation or focal infiltrates.

Chronic interstitial changes.

Postsurgical changes left hemithorax
